# Patient Record
Sex: Female | Born: 1950 | Race: White | Hispanic: No | Marital: Married | Smoking: Never smoker
Health system: Southern US, Community
[De-identification: ages and names within clinical notes are randomized; demographics above are authoritative.]

## PROBLEM LIST (undated history)

## (undated) DIAGNOSIS — K5792 Diverticulitis of intestine, part unspecified, without perforation or abscess without bleeding: Secondary | ICD-10-CM

## (undated) DIAGNOSIS — IMO0002 Reserved for concepts with insufficient information to code with codable children: Secondary | ICD-10-CM

## (undated) DIAGNOSIS — D1720 Benign lipomatous neoplasm of skin and subcutaneous tissue of unspecified limb: Secondary | ICD-10-CM

## (undated) DIAGNOSIS — K5900 Constipation, unspecified: Secondary | ICD-10-CM

## (undated) DIAGNOSIS — Z8051 Family history of malignant neoplasm of kidney: Secondary | ICD-10-CM

## (undated) DIAGNOSIS — C801 Malignant (primary) neoplasm, unspecified: Secondary | ICD-10-CM

## (undated) DIAGNOSIS — M1990 Unspecified osteoarthritis, unspecified site: Secondary | ICD-10-CM

## (undated) DIAGNOSIS — H18519 Endothelial corneal dystrophy, unspecified eye: Secondary | ICD-10-CM

## (undated) DIAGNOSIS — Z803 Family history of malignant neoplasm of breast: Secondary | ICD-10-CM

## (undated) DIAGNOSIS — N200 Calculus of kidney: Secondary | ICD-10-CM

## (undated) DIAGNOSIS — N281 Cyst of kidney, acquired: Secondary | ICD-10-CM

## (undated) DIAGNOSIS — Z808 Family history of malignant neoplasm of other organs or systems: Secondary | ICD-10-CM

## (undated) DIAGNOSIS — M21612 Bunion of left foot: Secondary | ICD-10-CM

## (undated) DIAGNOSIS — R319 Hematuria, unspecified: Secondary | ICD-10-CM

## (undated) DIAGNOSIS — K219 Gastro-esophageal reflux disease without esophagitis: Secondary | ICD-10-CM

## (undated) DIAGNOSIS — E785 Hyperlipidemia, unspecified: Secondary | ICD-10-CM

## (undated) DIAGNOSIS — R2230 Localized swelling, mass and lump, unspecified upper limb: Secondary | ICD-10-CM

## (undated) DIAGNOSIS — Z923 Personal history of irradiation: Secondary | ICD-10-CM

## (undated) DIAGNOSIS — Z800 Family history of malignant neoplasm of digestive organs: Secondary | ICD-10-CM

## (undated) DIAGNOSIS — N950 Postmenopausal bleeding: Secondary | ICD-10-CM

## (undated) DIAGNOSIS — L404 Guttate psoriasis: Secondary | ICD-10-CM

## (undated) DIAGNOSIS — R945 Abnormal results of liver function studies: Secondary | ICD-10-CM

## (undated) DIAGNOSIS — Z947 Corneal transplant status: Secondary | ICD-10-CM

## (undated) DIAGNOSIS — E860 Dehydration: Secondary | ICD-10-CM

## (undated) DIAGNOSIS — Z8619 Personal history of other infectious and parasitic diseases: Secondary | ICD-10-CM

## (undated) DIAGNOSIS — N289 Disorder of kidney and ureter, unspecified: Secondary | ICD-10-CM

## (undated) DIAGNOSIS — D696 Thrombocytopenia, unspecified: Secondary | ICD-10-CM

## (undated) DIAGNOSIS — Z7182 Exercise counseling: Secondary | ICD-10-CM

## (undated) DIAGNOSIS — Z809 Family history of malignant neoplasm, unspecified: Secondary | ICD-10-CM

## (undated) DIAGNOSIS — M21611 Bunion of right foot: Secondary | ICD-10-CM

## (undated) DIAGNOSIS — H1851 Endothelial corneal dystrophy: Secondary | ICD-10-CM

## (undated) HISTORY — PX: APPENDECTOMY: SHX54

## (undated) HISTORY — PX: TONSILLECTOMY: SHX5217

## (undated) HISTORY — DX: Thrombocytopenia, unspecified: D69.6

## (undated) HISTORY — DX: Postmenopausal bleeding: N95.0

## (undated) HISTORY — DX: Family history of malignant neoplasm of other organs or systems: Z80.8

## (undated) HISTORY — DX: Diverticulitis of intestine, part unspecified, without perforation or abscess without bleeding: K57.92

## (undated) HISTORY — DX: Dehydration: E86.0

## (undated) HISTORY — DX: Constipation, unspecified: K59.00

## (undated) HISTORY — DX: Localized swelling, mass and lump, unspecified upper limb: R22.30

## (undated) HISTORY — DX: Family history of malignant neoplasm of digestive organs: Z80.0

## (undated) HISTORY — PX: WRIST FRACTURE SURGERY: SHX121

## (undated) HISTORY — DX: Hyperlipidemia, unspecified: E78.5

## (undated) HISTORY — DX: Bunion of right foot: M21.612

## (undated) HISTORY — DX: Personal history of other infectious and parasitic diseases: Z86.19

## (undated) HISTORY — DX: Endothelial corneal dystrophy, unspecified eye: H18.519

## (undated) HISTORY — DX: Bunion of left foot: M21.611

## (undated) HISTORY — DX: Family history of malignant neoplasm, unspecified: Z80.9

## (undated) HISTORY — DX: Corneal transplant status: Z94.7

## (undated) HISTORY — DX: Cyst of kidney, acquired: N28.1

## (undated) HISTORY — DX: Guttate psoriasis: L40.4

## (undated) HISTORY — PX: CHOLECYSTECTOMY: SHX55

## (undated) HISTORY — DX: Benign lipomatous neoplasm of skin and subcutaneous tissue of unspecified limb: D17.20

## (undated) HISTORY — DX: Gastro-esophageal reflux disease without esophagitis: K21.9

## (undated) HISTORY — DX: Reserved for concepts with insufficient information to code with codable children: IMO0002

## (undated) HISTORY — DX: Unspecified osteoarthritis, unspecified site: M19.90

## (undated) HISTORY — DX: Family history of malignant neoplasm of breast: Z80.3

## (undated) HISTORY — PX: EYE SURGERY: SHX253

## (undated) HISTORY — PX: TUBAL LIGATION: SHX77

## (undated) HISTORY — PX: TOTAL ABDOMINAL HYSTERECTOMY: SHX209

## (undated) HISTORY — DX: Exercise counseling: Z71.82

## (undated) HISTORY — DX: Disorder of kidney and ureter, unspecified: N28.9

## (undated) HISTORY — DX: Family history of malignant neoplasm of kidney: Z80.51

## (undated) HISTORY — DX: Hematuria, unspecified: R31.9

## (undated) HISTORY — DX: Abnormal results of liver function studies: R94.5

## (undated) HISTORY — PX: FRACTURE SURGERY: SHX138

## (undated) HISTORY — DX: Endothelial corneal dystrophy: H18.51

## (undated) HISTORY — DX: Malignant (primary) neoplasm, unspecified: C80.1

---

## 1984-04-16 HISTORY — PX: APPENDECTOMY: SHX54

## 1989-04-16 HISTORY — PX: TOTAL ABDOMINAL HYSTERECTOMY: SHX209

## 2003-04-17 HISTORY — PX: COLONOSCOPY: SHX174

## 2005-05-14 ENCOUNTER — Ambulatory Visit: Payer: Self-pay | Admitting: Family Medicine

## 2005-06-12 ENCOUNTER — Ambulatory Visit: Payer: Self-pay | Admitting: Family Medicine

## 2005-06-19 ENCOUNTER — Encounter: Admission: RE | Admit: 2005-06-19 | Discharge: 2005-06-19 | Payer: Self-pay | Admitting: Family Medicine

## 2005-07-16 ENCOUNTER — Ambulatory Visit: Payer: Self-pay | Admitting: Family Medicine

## 2005-09-27 ENCOUNTER — Ambulatory Visit: Payer: Self-pay | Admitting: Family Medicine

## 2005-11-08 ENCOUNTER — Encounter (INDEPENDENT_AMBULATORY_CARE_PROVIDER_SITE_OTHER): Payer: Self-pay | Admitting: Specialist

## 2005-11-08 ENCOUNTER — Ambulatory Visit (HOSPITAL_BASED_OUTPATIENT_CLINIC_OR_DEPARTMENT_OTHER): Admission: RE | Admit: 2005-11-08 | Discharge: 2005-11-08 | Payer: Self-pay | Admitting: Surgery

## 2006-01-03 ENCOUNTER — Ambulatory Visit: Payer: Self-pay | Admitting: Family Medicine

## 2006-01-22 DIAGNOSIS — M949 Disorder of cartilage, unspecified: Secondary | ICD-10-CM

## 2006-01-22 DIAGNOSIS — M899 Disorder of bone, unspecified: Secondary | ICD-10-CM | POA: Insufficient documentation

## 2006-02-15 ENCOUNTER — Ambulatory Visit: Payer: Self-pay | Admitting: Family Medicine

## 2006-02-19 ENCOUNTER — Ambulatory Visit: Payer: Self-pay | Admitting: Internal Medicine

## 2006-02-19 ENCOUNTER — Encounter: Payer: Self-pay | Admitting: Family Medicine

## 2006-04-02 ENCOUNTER — Ambulatory Visit: Payer: Self-pay | Admitting: Internal Medicine

## 2006-04-02 ENCOUNTER — Encounter: Payer: Self-pay | Admitting: Family Medicine

## 2006-07-01 ENCOUNTER — Ambulatory Visit: Payer: Self-pay | Admitting: Family Medicine

## 2006-07-04 ENCOUNTER — Encounter: Admission: RE | Admit: 2006-07-04 | Discharge: 2006-07-04 | Payer: Self-pay | Admitting: Family Medicine

## 2006-07-09 ENCOUNTER — Telehealth: Payer: Self-pay | Admitting: Family Medicine

## 2006-07-29 ENCOUNTER — Ambulatory Visit: Payer: Self-pay | Admitting: Family Medicine

## 2006-08-06 ENCOUNTER — Encounter: Payer: Self-pay | Admitting: Family Medicine

## 2007-01-23 ENCOUNTER — Ambulatory Visit: Payer: Self-pay | Admitting: Family Medicine

## 2007-02-25 ENCOUNTER — Encounter: Payer: Self-pay | Admitting: Family Medicine

## 2007-02-25 LAB — CONVERTED CEMR LAB
ALT: 27 units/L
Albumin: 4.6 g/dL
Cholesterol: 179 mg/dL
Glucose, Bld: 74 mg/dL
Total Protein: 7 g/dL
Triglycerides: 52 mg/dL

## 2007-03-10 ENCOUNTER — Telehealth (INDEPENDENT_AMBULATORY_CARE_PROVIDER_SITE_OTHER): Payer: Self-pay | Admitting: *Deleted

## 2007-03-12 ENCOUNTER — Telehealth (INDEPENDENT_AMBULATORY_CARE_PROVIDER_SITE_OTHER): Payer: Self-pay | Admitting: *Deleted

## 2007-03-18 ENCOUNTER — Ambulatory Visit: Payer: Self-pay | Admitting: Family Medicine

## 2007-03-19 ENCOUNTER — Encounter: Payer: Self-pay | Admitting: Family Medicine

## 2007-03-19 ENCOUNTER — Encounter: Admission: RE | Admit: 2007-03-19 | Discharge: 2007-03-19 | Payer: Self-pay | Admitting: Family Medicine

## 2007-03-19 LAB — CONVERTED CEMR LAB
Basophils Absolute: 0 10*3/uL (ref 0.0–0.1)
Basophils Relative: 0 % (ref 0–1)
Eosinophils Absolute: 0 10*3/uL — ABNORMAL LOW (ref 0.2–0.7)
Eosinophils Relative: 1 % (ref 0–5)
HCT: 42.7 % (ref 36.0–46.0)
MCHC: 33.7 g/dL (ref 30.0–36.0)
MCV: 90.7 fL (ref 78.0–100.0)
Neutrophils Relative %: 61 % (ref 43–77)
Platelets: 153 10*3/uL (ref 150–400)
RDW: 12.7 % (ref 11.5–15.5)
TSH: 1.103 microintl units/mL (ref 0.350–5.50)
WBC: 5.8 10*3/uL (ref 4.0–10.5)

## 2007-03-20 ENCOUNTER — Telehealth: Payer: Self-pay | Admitting: Family Medicine

## 2007-03-27 ENCOUNTER — Encounter: Payer: Self-pay | Admitting: Family Medicine

## 2007-03-27 DIAGNOSIS — R319 Hematuria, unspecified: Secondary | ICD-10-CM | POA: Insufficient documentation

## 2007-04-02 ENCOUNTER — Encounter: Payer: Self-pay | Admitting: Family Medicine

## 2007-04-03 LAB — CONVERTED CEMR LAB
Bilirubin Urine: NEGATIVE
Hemoglobin, Urine: NEGATIVE
Leukocytes, UA: NEGATIVE
Protein, ur: NEGATIVE mg/dL
Urine Glucose: NEGATIVE mg/dL
pH: 6 (ref 5.0–8.0)

## 2007-04-04 ENCOUNTER — Telehealth: Payer: Self-pay | Admitting: Family Medicine

## 2007-04-07 ENCOUNTER — Encounter: Payer: Self-pay | Admitting: Family Medicine

## 2007-05-22 ENCOUNTER — Ambulatory Visit: Payer: Self-pay | Admitting: Family Medicine

## 2007-05-22 DIAGNOSIS — R109 Unspecified abdominal pain: Secondary | ICD-10-CM | POA: Insufficient documentation

## 2007-06-17 ENCOUNTER — Encounter: Admission: RE | Admit: 2007-06-17 | Discharge: 2007-06-17 | Payer: Self-pay | Admitting: Family Medicine

## 2007-06-19 ENCOUNTER — Encounter: Admission: RE | Admit: 2007-06-19 | Discharge: 2007-06-19 | Payer: Self-pay | Admitting: Family Medicine

## 2007-07-14 ENCOUNTER — Ambulatory Visit: Payer: Self-pay | Admitting: Family Medicine

## 2007-07-15 ENCOUNTER — Telehealth: Payer: Self-pay | Admitting: Family Medicine

## 2007-07-16 ENCOUNTER — Encounter: Admission: RE | Admit: 2007-07-16 | Discharge: 2007-08-01 | Payer: Self-pay | Admitting: Family Medicine

## 2007-08-01 ENCOUNTER — Encounter: Payer: Self-pay | Admitting: Family Medicine

## 2007-11-03 ENCOUNTER — Ambulatory Visit: Payer: Self-pay | Admitting: Family Medicine

## 2007-12-16 ENCOUNTER — Ambulatory Visit: Payer: Self-pay | Admitting: Family Medicine

## 2007-12-16 LAB — CONVERTED CEMR LAB
Glucose, Urine, Semiquant: NEGATIVE
Ketones, urine, test strip: NEGATIVE
Protein, U semiquant: NEGATIVE
Specific Gravity, Urine: 1.01
WBC Urine, dipstick: NEGATIVE
pH: 6.5

## 2007-12-17 LAB — CONVERTED CEMR LAB
ALT: 26 units/L (ref 0–35)
AST: 27 units/L (ref 0–37)
Albumin: 4.8 g/dL (ref 3.5–5.2)
CO2: 28 meq/L (ref 19–32)
Calcium: 9.9 mg/dL (ref 8.4–10.5)
Chloride: 102 meq/L (ref 96–112)
Potassium: 4.8 meq/L (ref 3.5–5.3)
Total Protein: 7.7 g/dL (ref 6.0–8.3)

## 2008-03-05 ENCOUNTER — Ambulatory Visit: Payer: Self-pay | Admitting: Family Medicine

## 2008-03-05 LAB — CONVERTED CEMR LAB
Basophils Absolute: 0 10*3/uL (ref 0.0–0.1)
Basophils Relative: 0 % (ref 0–1)
Hemoglobin: 14.4 g/dL (ref 12.0–15.0)
MCHC: 33.9 g/dL (ref 30.0–36.0)
Monocytes Absolute: 0.3 10*3/uL (ref 0.1–1.0)
Neutro Abs: 3.1 10*3/uL (ref 1.7–7.7)
Neutrophils Relative %: 61 % (ref 43–77)
Nitrite: NEGATIVE
Platelets: 148 10*3/uL — ABNORMAL LOW (ref 150–400)
Protein, U semiquant: NEGATIVE
RBC / HPF: NONE SEEN (ref <3)
RDW: 13.2 % (ref 11.5–15.5)
Urobilinogen, UA: 0.2
WBC Urine, dipstick: NEGATIVE
pH: 5.5

## 2008-03-08 ENCOUNTER — Ambulatory Visit: Payer: Self-pay | Admitting: Internal Medicine

## 2008-03-10 ENCOUNTER — Telehealth (INDEPENDENT_AMBULATORY_CARE_PROVIDER_SITE_OTHER): Payer: Self-pay | Admitting: *Deleted

## 2008-03-17 ENCOUNTER — Telehealth: Payer: Self-pay | Admitting: Internal Medicine

## 2008-03-26 ENCOUNTER — Telehealth: Payer: Self-pay | Admitting: Internal Medicine

## 2008-03-26 ENCOUNTER — Encounter: Payer: Self-pay | Admitting: Family Medicine

## 2008-03-30 ENCOUNTER — Ambulatory Visit: Payer: Self-pay | Admitting: Internal Medicine

## 2008-03-31 LAB — CONVERTED CEMR LAB
Fecal Occult Blood: NEGATIVE
OCCULT 3: NEGATIVE
OCCULT 4: NEGATIVE

## 2008-04-20 ENCOUNTER — Ambulatory Visit: Payer: Self-pay | Admitting: Internal Medicine

## 2008-05-07 ENCOUNTER — Encounter: Payer: Self-pay | Admitting: Family Medicine

## 2008-05-31 ENCOUNTER — Ambulatory Visit: Payer: Self-pay | Admitting: Family Medicine

## 2008-05-31 DIAGNOSIS — F411 Generalized anxiety disorder: Secondary | ICD-10-CM | POA: Insufficient documentation

## 2008-05-31 DIAGNOSIS — M6740 Ganglion, unspecified site: Secondary | ICD-10-CM | POA: Insufficient documentation

## 2008-07-14 ENCOUNTER — Encounter: Admission: RE | Admit: 2008-07-14 | Discharge: 2008-07-14 | Payer: Self-pay | Admitting: Family Medicine

## 2009-04-06 ENCOUNTER — Ambulatory Visit: Payer: Self-pay | Admitting: Family Medicine

## 2009-04-06 ENCOUNTER — Encounter: Admission: RE | Admit: 2009-04-06 | Discharge: 2009-04-06 | Payer: Self-pay | Admitting: Family Medicine

## 2009-04-06 DIAGNOSIS — M5412 Radiculopathy, cervical region: Secondary | ICD-10-CM | POA: Insufficient documentation

## 2009-04-07 LAB — CONVERTED CEMR LAB
ALT: 31 units/L (ref 0–35)
AST: 30 units/L (ref 0–37)
Albumin: 4.4 g/dL (ref 3.5–5.2)
Alkaline Phosphatase: 58 units/L (ref 39–117)
HCT: 40.3 % (ref 36.0–46.0)
LDL Cholesterol: 97 mg/dL (ref 0–99)
Lymphocytes Relative: 27 % (ref 12–46)
Lymphs Abs: 1.7 10*3/uL (ref 0.7–4.0)
Neutrophils Relative %: 64 % (ref 43–77)
Platelets: 205 10*3/uL (ref 150–400)
Potassium: 4.3 meq/L (ref 3.5–5.3)
RBC: 4.63 M/uL (ref 3.87–5.11)
Sodium: 143 meq/L (ref 135–145)
Total Protein: 7.1 g/dL (ref 6.0–8.3)
Vit D, 25-Hydroxy: 9 ng/mL — ABNORMAL LOW (ref 30–89)
WBC: 6.3 10*3/uL (ref 4.0–10.5)

## 2009-04-12 ENCOUNTER — Telehealth: Payer: Self-pay | Admitting: Family Medicine

## 2009-04-14 ENCOUNTER — Encounter: Payer: Self-pay | Admitting: Family Medicine

## 2009-05-06 ENCOUNTER — Ambulatory Visit: Payer: Self-pay | Admitting: Family Medicine

## 2009-05-06 ENCOUNTER — Telehealth (INDEPENDENT_AMBULATORY_CARE_PROVIDER_SITE_OTHER): Payer: Self-pay | Admitting: *Deleted

## 2009-05-06 DIAGNOSIS — L408 Other psoriasis: Secondary | ICD-10-CM | POA: Insufficient documentation

## 2009-05-06 DIAGNOSIS — E559 Vitamin D deficiency, unspecified: Secondary | ICD-10-CM | POA: Insufficient documentation

## 2009-07-04 ENCOUNTER — Telehealth (INDEPENDENT_AMBULATORY_CARE_PROVIDER_SITE_OTHER): Payer: Self-pay | Admitting: *Deleted

## 2009-07-05 ENCOUNTER — Encounter: Payer: Self-pay | Admitting: Family Medicine

## 2009-07-06 LAB — CONVERTED CEMR LAB: Vit D, 25-Hydroxy: 57 ng/mL (ref 30–89)

## 2009-07-14 ENCOUNTER — Encounter: Admission: RE | Admit: 2009-07-14 | Discharge: 2009-07-14 | Payer: Self-pay | Admitting: Family Medicine

## 2009-07-25 ENCOUNTER — Ambulatory Visit: Payer: Self-pay | Admitting: Family Medicine

## 2009-07-25 LAB — CONVERTED CEMR LAB
Protein, U semiquant: >=300
Specific Gravity, Urine: 1.025
Urobilinogen, UA: 1
WBC Urine, dipstick: NEGATIVE

## 2009-07-26 ENCOUNTER — Telehealth (INDEPENDENT_AMBULATORY_CARE_PROVIDER_SITE_OTHER): Payer: Self-pay | Admitting: *Deleted

## 2009-07-26 ENCOUNTER — Encounter: Payer: Self-pay | Admitting: Family Medicine

## 2009-07-26 LAB — CONVERTED CEMR LAB

## 2009-07-28 ENCOUNTER — Encounter: Payer: Self-pay | Admitting: Family Medicine

## 2009-08-01 ENCOUNTER — Encounter: Payer: Self-pay | Admitting: Family Medicine

## 2010-02-01 ENCOUNTER — Ambulatory Visit (HOSPITAL_COMMUNITY): Admission: RE | Admit: 2010-02-01 | Discharge: 2010-02-01 | Payer: Self-pay | Admitting: Urology

## 2010-02-08 ENCOUNTER — Encounter: Payer: Self-pay | Admitting: Family Medicine

## 2010-05-07 ENCOUNTER — Encounter: Payer: Self-pay | Admitting: Family Medicine

## 2010-05-16 NOTE — Progress Notes (Signed)
Summary: FYI   Phone Note Call from Patient   Caller: Patient Summary of Call: Pt called to inform you that [facility] only [facility] day [facility] at a time. So they dispensed #4 w/ 3 refills. Pt's insurance will only cover every 30 days.  Initial call taken by: Payton Spark CMA,  May 06, 2009 3:54 PM

## 2010-05-16 NOTE — Assessment & Plan Note (Signed)
Summary: blood in urine  Nurse Visit    Allergies: No Known Drug Allergies Laboratory Results   Urine Tests    Routine Urinalysis   Color: brown Appearance: Clear Glucose: 100   (Normal Range: Negative) Bilirubin: large   (Normal Range: Negative) Ketone: small (15)   (Normal Range: Negative) Spec. Gravity: 1.025   (Normal Range: 1.003-1.035) Blood: large   (Normal Range: Negative) pH: 5.5   (Normal Range: 5.0-8.0) Protein: >=300   (Normal Range: Negative) Urobilinogen: 1.0   (Normal Range: 0-1) Nitrite: negative   (Normal Range: Negative) Leukocyte Esterace: negative   (Normal Range: Negative)       Orders Added: 1)  Est. Patient Level I [30160] 2)  T-Urine Microscopic [10932-35573] 3)  UA Dipstick w/o Micro (automated)  [81003]    Impression & Recommendations:  Problem # 1:  HEMATURIA UNSPECIFIED (ICD-599.70) Send for micro to see if whole RBCs.  Will send results to urology.  She will need f/u with them.   Orders: T-Urine Microscopic (22025-42706) UA Dipstick w/o Micro (automated)  (81003)  Complete Medication List: 1)  Vitamin D 50,000 Iu  .Marland Kitchen.. 1 capsule by mouth once a wk   Pt aware.Arvilla Market CMAMarcelino Duster July 25, 2009 2:58 PM

## 2010-05-16 NOTE — Letter (Signed)
Summary: [facility]  [facility]   Imported By: Lanelle Bal 08/10/2009 08:22:52  _____________________________________________________________________  External Attachment:    Type:   Image     Comment:   External Document

## 2010-05-16 NOTE — Assessment & Plan Note (Signed)
Summary: f/u rash/ low Vit D   Vital Signs:  Patient profile:   59 year old female Height:      60.5 inches Weight:      144 pounds BMI:     27.76 O2 Sat:      99 % on Room air Temp:     98.2 degrees F oral Pulse rate:   75 / minute BP sitting:   123 / 77  (left arm) Cuff size:   regular  Vitals Entered By: Payton Spark CMA (May 06, 2009 10:20 AM)  O2 Flow:  Room air CC: F/U    Primary Care Provider:  Seymour Bars DO  CC:  F/U .  History of Present Illness: 60 yo WF presents for f/u rash.  She was seen by Dr Stefanie Libel and the rash was identified at guttate posorisias.  She is on clobetasol cream and the rash is getting much better.  She was treated for a URI with Keflex presuming that it might have been strep.    Her cervical radiculopathy has completely improved.  Her Prednisone burst really helped.  She had arthritis in her neck on xray.  She decided not to do PT since her pain has improved.       Current Medications (verified): 1)  Vitamin D 50,000 Iu .Marland Kitchen.. 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 Retired Pensions consultant.  Moved from [place] in 2006.  Daughter and 2 young grandkids live here.  Married to Belle.  Likes to run.  Has dogs. Patient has never smoked.  Alcohol Use - no Daily Caffeine Use -2 Illicit Drug Use - no  Review of Systems      See HPI  Physical Exam  General:  alert, well-developed, well-nourished, well-hydrated, and overweight-appearing.   Neck:  no masses.   Lungs:  Normal respiratory effort, chest expands symmetrically. Lungs are clear to auscultation, no crackles or wheezes. Heart:  Normal rate and regular rhythm. S1 and S2 normal without gallop,  murmur, click, rub or other extra sounds. Msk:  full active C spine and glenohumeral ROM Neurologic:  grip + 5/5 Skin:  color normal.  guttate psoriasis rash over LEs only Psych:  good eye contact, not anxious appearing, and not depressed appearing.     Impression & Recommendations:  Problem # 1:  CERVICAL RADICULOPATHY, RIGHT (ICD-723.4) Assessment Improved C spine Xray confirms arthritic changes.  Her symptom resolved after a prednisone burst.  Her ROM is excellent today so she declined any need for PT.  She can use OTC Aleve on as needed basis.  Problem # 2:  UNSPECIFIED VITAMIN D DEFICIENCY (ICD-268.9) She is to stay on RX Vit D for 2 more months then REcheck level.  She was 9 last month.  She is also taking OTC Vit D 1000 International Units daily.    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  She agrees to work on relaxation and to start exercising to deal with her symptoms.  Call if worsening.  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 Seen by Dr  Stefanie Libel. Triggered by URI.  Complete Medication List: 1)  Vitamin D 50,000 Iu  .Marland Kitchen.. 1 capsule by mouth once a wk  Patient Instructions: 1)  Work on getting exercise 45+ minutes 4 days/ wk. Prescriptions: VITAMIN D 50,000 IU 1 capsule by mouth once a wk  #11 tabs x 0   Entered and Authorized by:   Seymour Bars DO   Signed by:   Seymour Bars DO on 05/06/2009   Method used:   Printed then [facility]ed to ...       [facility]  [address] [phone]       [address]       [place], [place]  [address]       Ph: [phone] or [phone]       Fax: [phone]   RxID:   [id number]

## 2010-05-16 NOTE — Consult Note (Signed)
Summary: [facility]   Imported By: Lanelle Bal 05/05/2009 11:58:01  _____________________________________________________________________  External Attachment:    Type:   Image     Comment:   External Document

## 2010-05-16 NOTE — Progress Notes (Signed)
Summary: Lab orders   

## 2010-05-16 NOTE — Progress Notes (Signed)
Summary: pt. returned Michelle's call  Phone Note Call from Patient   Summary of Call: Call patient  back on her cell#  [phone], She was returning Michelle's phone call. Thank you, Michaelle Copas Initial call taken by: Michaelle Copas,  July 26, 2009 12:18 PM  Follow-up for Phone Call        Pt aware of apt Follow-up by: Payton Spark CMA,  July 26, 2009 1:06 PM

## 2010-05-16 NOTE — Letter (Signed)
Summary: [facility]  [facility]   Imported By: Lanelle Bal 02/27/2010 08:18:57  _____________________________________________________________________  External Attachment:    Type:   Image     Comment:   External Document

## 2010-05-16 NOTE — Consult Note (Signed)
Summary: [facility]  [facility]   Imported By: Lanelle Bal 08/10/2009 08:20:28  _____________________________________________________________________  External Attachment:    Type:   Image     Comment:   External Document

## 2010-06-07 ENCOUNTER — Other Ambulatory Visit: Payer: Self-pay | Admitting: Family Medicine

## 2010-06-07 DIAGNOSIS — Z1231 Encounter for screening mammogram for malignant neoplasm of breast: Secondary | ICD-10-CM

## 2010-06-28 LAB — CREATININE, SERUM
Creatinine, Ser: 0.61 mg/dL (ref 0.4–1.2)
GFR calc non Af Amer: >60 mL/min (ref >60)

## 2010-07-17 ENCOUNTER — Ambulatory Visit
Admission: RE | Admit: 2010-07-17 | Discharge: 2010-07-17 | Disposition: A | Discharge status: Home / Self Care | Payer: BC Managed Care – PPO | Source: Ambulatory Visit | Attending: Family Medicine | Admitting: Family Medicine

## 2010-07-17 DIAGNOSIS — Z1231 Encounter for screening mammogram for malignant neoplasm of breast: Secondary | ICD-10-CM

## 2010-09-01 NOTE — Assessment & Plan Note (Signed)
[facility]                           GASTROENTEROLOGY OFFICE NOTE   Joan Lopez, Joan Lopez                      MRN:          [id number]  DATE:02/19/2006                            DOB:          1950-07-24    CHIEF COMPLAINT:  Rectal bleeding.   ASSESSMENT:  Intermittent rectal bleeding related to running.  I think this  is anorectal bleeding, traumatic, probably from running, question if she has  some anal or lower rectal irritation from that.  She had a colonoscopy in  2005 that was normal by report and we will request that.   PLAN:  I have asked her to try to lubricate with Vaseline around the  perianal area and perineal body to see if that helps.  She will come back to  see me in 2 months to reassess to make sure this resolves.  If not, a  sigmoidoscopy could be needed.  An anoscopy was entirely unrevealing today.  We will review the colonoscopy reports as well.   HISTORY:  A 60 year old white woman that has noted that when she runs and  develops an urge to defecate, if she holds onto the bowel movement and then  when she returns she will sometimes have blood on the tissue paper after  defecation.  Sometimes there will be a streak of bright red blood on the  stool, which is brown, as well.  There is no real change in bowel habits  with diarrhea or stool size.  This has only happened a few to several times  in her entire life.  Things seem to get a little worse after she took  Augmentin because of a dog bite and had some perineal and vaginal itching  which did improve with Diflucan.   MEDICATIONS:  None.   DRUG ALLERGIES:  She lists AUGMENTIN because of those problems.   PAST MEDICAL HISTORY:  1. Hysterectomy.  2. Tubal ligation.  3. Appendectomy.  4. Cholecystectomy.  5. Colonoscopy as above.  6. Dog bite as above.   FAMILY HISTORY:  Breast cancer in a sister.  A maternal aunt had colon  cancer.  A daughter has Crohn's disease.   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  No alcohol, tobacco or drugs.   REVIEW OF SYSTEMS:  Otherwise negative.   PHYSICAL EXAMINATION:  GENERAL:  Reveals a pleasant, well-developed, middle-  aged white woman in no acute distress.  VITAL SIGNS:  Height 5 feet, weight 104 pounds, blood pressure 130/84.  EYES:  Anicteric.  ABDOMEN:  Soft, nontender, without organomegaly or mass.  RECTAL:  Exam in the presence of female nursing staff shows scanty brown  stool, no mass, nontender, no hemorrhoids.  There is no perianal change.  An  anoscopy is performed which demonstrates a normal exam.  No groin  adenopathy.  NEUROLOGIC/PSYCHIATRIC:  She is alert and oriented x3.   I appreciate the opportunity to care for this patient.     Iva Boop, MD,FACG  Electronically Signed    CEG/MedQ  DD: 02/19/2006  DT: 02/20/2006  Job #: [id number]   cc:   Nani Gasser, M.D.

## 2010-09-01 NOTE — Op Note (Signed)
NAME:  Joan Lopez, Joan Lopez NO.:  [id number]   MEDICAL RECORD NO.:  [id number]          PATIENT TYPE:  AMB   LOCATION:  [facility]                         FACILITY:  [facility]   PHYSICIAN:  Thomas A. Cornett, M.D.DATE OF BIRTH:  10-01-1950   DATE OF PROCEDURE:  11/08/2005  DATE OF DISCHARGE:                                 OPERATIVE REPORT   PREOPERATIVE DIAGNOSIS:  Right arm mass.   POSTOPERATIVE DIAGNOSIS:  Right arm mass.   PROCEDURE:  Excision of right upper extremity mass measuring 2 x 3 cm,  probable lipoma.   SURGEON:  Maisie Fus A. Cornett, M.D.   ANESTHESIA:  MAC with 10 mL of 0.5% Sensorcaine with epinephrine local.   ESTIMATED BLOOD LOSS:  5 mL.   SPECIMEN:  Right arm mass to pathology.   INDICATIONS FOR PROCEDURE:  The patient is a 60 year old female with an  enlarging mass in her right upper extremity.  This looked like a lipoma on  exam but it was getting larger and causing her discomfort.  She decided to  have it removed.  After discussion of the procedure and potential  complications, she agreed to proceed.   DESCRIPTION OF PROCEDURE:  The patient was brought to the operating room nd  placed supine.  Her right upper extremity was prepped and draped in a  sterile fashion.  After induction of MAC anesthesia, the area was  anesthetized with local anesthesia.  Incision was made over the mass  measuring 2-3 cm.  We were able to dissect down and this came out quite  easily in the subcutaneous fat.  It was sent off to pathology.  Hemostasis  was excellent.  The wound was closed in two layers using a 3-0 Vicryl and a  4-0 Monocryl.  Sterile dressings were applied.  The patient tolerated the  procedure well.  She is taken to the recovery room in satisfactory  condition.  All final counts of sponge, needle and instruments are found to  be correct.      Thomas A. Cornett, M.D.  Electronically Signed     TAC/MEDQ  D:  11/08/2005  T:  11/08/2005  Job:   [id number]   cc:   Lebron Conners, M.D.  [address]  [place]  [place] [address]

## 2010-09-01 NOTE — Assessment & Plan Note (Signed)
[facility]                         GASTROENTEROLOGY OFFICE NOTE   Joan Lopez, Joan Lopez                      MRN:          [id number]  DATE:04/02/2006                            DOB:          Nov 23, 1950    CHIEF COMPLAINT:  Followup of rectal bleeding.   Ms. Scism had two spells of rectal bleeding close to her last visit,  but none since then.  She used some Vaseline on the perineal and  perianal area, but now has not needed to do that, and she is running  without difficulty and no rectal bleeding.  I have reviewed her  colonoscopy report from 2005, and that showed some hemorrhoids but was  otherwise unrevealing.   PHYSICAL EXAMINATION:  Weight 104 pounds, pulse 68, blood pressure  118/80.   Rectal bleeding, resolved.  It sounds anorectal.  She is reassured  today.   PLAN:  If this recurs with any significant regularity she is to come  back, or if she is actually passing blood into the toilet.  She  understands, and will look for that.  A routine screening colonoscopy in  2015 is appropriate.  She is advised about this.  In 2010 it would not  be unreasonable to start yearly Hemoccults.     Iva Boop, MD,FACG  Electronically Signed    CEG/MedQ  DD: 04/02/2006  DT: 04/03/2006  Job #: [id number]   cc:   Nani Gasser, M.D.

## 2010-11-28 ENCOUNTER — Encounter: Payer: Self-pay | Admitting: Family Medicine

## 2010-11-28 ENCOUNTER — Ambulatory Visit (INDEPENDENT_AMBULATORY_CARE_PROVIDER_SITE_OTHER): Payer: BC Managed Care – PPO | Admitting: Family Medicine

## 2010-11-28 VITALS — BP 121/76 | HR 101 | Ht 60.5 in | Wt 136.0 lb

## 2010-11-28 DIAGNOSIS — H18519 Endothelial corneal dystrophy, unspecified eye: Secondary | ICD-10-CM

## 2010-11-28 DIAGNOSIS — R209 Unspecified disturbances of skin sensation: Secondary | ICD-10-CM

## 2010-11-28 DIAGNOSIS — R200 Anesthesia of skin: Secondary | ICD-10-CM

## 2010-11-28 NOTE — Progress Notes (Signed)
  Subjective:    Patient ID: Joan Lopez, female    DOB: Feb 01, 1951, 60 y.o.   MRN: [id number]  HPI Started to noticed some numbness on the right side of her face in mid July . She recently had dental work but that was tech before her procedure.  The Left eye has been more moist as well.  No facial drooping.  HShe did talk to her dentisnt about this. Sx are usually daily, but comes and goes.  Not painful.  No recent vision changes. No ear pain. No recent colds or URI. No difficulty swallowing. No prior history. She also noted she has a history of cervical arthritis  Guttate psoriasis dx a couple of years ago.   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    Review of Systems      Objective:   Physical Exam  Constitutional: She is oriented to person, place, and time. She appears well-developed and well-nourished.  HENT:  Head: Normocephalic and atraumatic.  Right Ear: External ear normal.  Left Ear: External ear normal.  Nose: Nose normal.  Mouth/Throat: Oropharynx is clear and moist.       TMs and canals are clear bilaterally  Neck: Neck supple. No thyromegaly present.  Musculoskeletal:       Neck with normal range of motion  Lymphadenopathy:    She has no cervical adenopathy.  Neurological: She is alert and oriented to person, place, and time. No cranial nerve deficit.       No decrease in sensation, it is symmetric over both sides of the face.  Skin: Skin is warm and dry.  Psychiatric: She has a normal mood and affect. Her behavior is normal.          Assessment & Plan:  Facial numbness left side-unclear etiology. This does not appear to be a Bell's palsy or a trigeminal neuralgia per se don't see any sign of infection on exam. They'll check a CBC. She is too young to have temporal arteritis and usually this is associated with headaches and pain which she does not have to. They'll I will check a sedimentation rate today. She does have a history of cervical  radiculopathy but this should be unrelated to the facial numbness. Her exam is technically normal. Consider other cerebral lesion and that this is unlikely. Consider imaging of her head her lab tests are normal. Her symptoms are certainly having more frequently on a daily basis. She has no prior history of migraines I don't think this is a migraine variant. This also started technically before she had her dental work, thus unlikely to be related to that.

## 2010-11-29 ENCOUNTER — Telehealth: Payer: Self-pay | Admitting: Family Medicine

## 2010-11-29 DIAGNOSIS — R200 Anesthesia of skin: Secondary | ICD-10-CM

## 2010-11-29 LAB — CBC WITH DIFFERENTIAL/PLATELET
Basophils Absolute: 0 10*3/uL (ref 0.0–0.1)
Basophils Relative: 0 % (ref 0–1)
Hemoglobin: 13.2 g/dL (ref 12.0–15.0)
MCHC: 33.2 g/dL (ref 30.0–36.0)
Monocytes Relative: 8 % (ref 3–12)
Neutro Abs: 2.6 10*3/uL (ref 1.7–7.7)
Neutrophils Relative %: 59 % (ref 43–77)

## 2010-11-29 LAB — SEDIMENTATION RATE: Sed Rate: 7 mm/hr (ref 0–22)

## 2010-11-29 LAB — TSH: TSH: 1.426 u[IU]/mL (ref 0.350–4.500)

## 2010-11-29 NOTE — Telephone Encounter (Signed)
Addended by: Nani Gasser D on: 11/29/2010 02:18 PM   Modules accepted: Orders

## 2010-11-29 NOTE — Telephone Encounter (Signed)
Call pt: CBC, thyroid, and sed rate is nl. I really would like her to see a neurologist to help figure out what is causing this. IF ok then let me know.

## 2010-11-29 NOTE — Telephone Encounter (Signed)
Pt notified and would like to get a referral to neurology.

## 2010-11-29 NOTE — Telephone Encounter (Signed)
LMOM with results and to call back if wants a neuro appt

## 2010-11-29 NOTE — Telephone Encounter (Signed)
Referral entered  

## 2011-02-06 DIAGNOSIS — G500 Trigeminal neuralgia: Secondary | ICD-10-CM | POA: Insufficient documentation

## 2011-03-28 ENCOUNTER — Telehealth: Payer: Self-pay | Admitting: Internal Medicine

## 2011-03-28 ENCOUNTER — Telehealth: Payer: Self-pay | Admitting: *Deleted

## 2011-03-28 NOTE — Telephone Encounter (Signed)
Pt having some discomfort on left side since before thanksgiving. Hasn't gotten any better and getting a little more worse. Dx with Diverticulitis in the past and has occasional flares. Dull ache, no nausea. Pt wants to know what she should do- meds or be seen.

## 2011-03-28 NOTE — Telephone Encounter (Signed)
The best would be for her to call her GI but if can't get in we can try to see her.

## 2011-03-28 NOTE — Telephone Encounter (Signed)
Pt notified. KJ LPN 

## 2011-03-28 NOTE — Telephone Encounter (Signed)
Patient has a discomfort in her abdomen that has been "coming and going" since November.  She says "its not really a pain as much as a feeling".  She states it is not associated with any other GI symptoms.  She will see Dr Linford Arnold on Monday.  I have scheduled her an appt for 04/19/11,  If Dr Linford Arnold feels it is needed we will work her in with an extender for an earlier appt.  Patient is agreeable to this plan.  She will call back for worsening symptoms.

## 2011-03-29 ENCOUNTER — Encounter: Payer: Self-pay | Admitting: Family Medicine

## 2011-04-02 ENCOUNTER — Encounter: Payer: Self-pay | Admitting: Family Medicine

## 2011-04-02 ENCOUNTER — Ambulatory Visit (INDEPENDENT_AMBULATORY_CARE_PROVIDER_SITE_OTHER): Payer: BC Managed Care – PPO | Admitting: Family Medicine

## 2011-04-02 DIAGNOSIS — R109 Unspecified abdominal pain: Secondary | ICD-10-CM

## 2011-04-02 LAB — COMPLETE METABOLIC PANEL WITH GFR
ALT: 25 U/L (ref 0–35)
AST: 28 U/L (ref 0–37)
Alkaline Phosphatase: 63 U/L (ref 39–117)
Creat: 0.66 mg/dL (ref 0.50–1.10)
Total Bilirubin: 0.6 mg/dL (ref 0.3–1.2)

## 2011-04-02 LAB — CBC WITH DIFFERENTIAL/PLATELET
Eosinophils Absolute: 0 10*3/uL (ref 0.0–0.7)
Eosinophils Relative: 0 % (ref 0–5)
Hemoglobin: 13.3 g/dL (ref 12.0–15.0)
Lymphocytes Relative: 26 % (ref 12–46)
Lymphs Abs: 1.3 10*3/uL (ref 0.7–4.0)
MCH: 30.2 pg (ref 26.0–34.0)
MCV: 87 fL (ref 78.0–100.0)
Monocytes Relative: 8 % (ref 3–12)
RBC: 4.4 MIL/uL (ref 3.87–5.11)

## 2011-04-02 NOTE — Patient Instructions (Signed)
We will call you with your lab results. If you don't here from us in about a week then please give us a call at [phone].  

## 2011-04-02 NOTE — Progress Notes (Signed)
Subjective:    Patient ID: Joan Lopez, female    DOB: 1951/04/06, 60 y.o.   MRN: [id number]  HPI In 1995 saw Dr. Angelina Sheriff and told had "diverticulitis".  Never had fever  or bowel changes. Will get one episode a year of cramping feeling for 2-3 days in duration.  Still no fever this time or blood in the stool.  No N/V.  No change in bowels. No constipation or diarrhea.  Got away with no medication intervention. Presents today with an epidose that has last 4 weeks. Same sxs.  Her GI is Dr. Elenore Paddy but can't get in until Jan 3rd. Says last colonsocopy was 8 yr ago.  She says the condition has not felt painful but feels more like dull ache   Review of Systems BP 119/73  Pulse 86  Wt 146 lb (66.225 kg)    No Known Allergies  Past Medical History  Diagnosis Date  . Bilateral bunions   . DDD (degenerative disc disease)     spine w narrowing  . Diverticulitis   . Guttate psoriasis     Past Surgical History  Procedure Date  . Appendectomy   . Tubal ligation   . Cholecystectomy   . Ltcs   . Lipoma removed     right axillary  . Total abdominal hysterectomy     for fibroids    History   Social History  . Marital Status: Married    Spouse Name: N/A    Number of Children: N/A  . Years of Education: N/A   Occupational History  . Not on file.   Social History Main Topics  . Smoking status: Never Smoker   . Smokeless tobacco: Not on file  . Alcohol Use: No  . Drug Use: No  . Sexually Active: Not on file   Other Topics Concern  . Not on file   Social History Narrative  . No narrative on file    Family History  Problem Relation Age of Onset  . Stroke Mother   . Heart disease Mother   . Kidney disease Father   . Cancer Sister 63    breast  . Cancer Maternal Aunt     colon, stomach  . Cancer Paternal Uncle     neck tumor  . Cancer Maternal Grandmother     brain tumor  . Cancer Paternal Grandmother     post menopausal breast cancer         Objective:   Physical Exam  Constitutional: She appears well-developed and well-nourished.  HENT:  Head: Atraumatic.  Cardiovascular: Normal rate, regular rhythm and normal heart sounds.   Pulmonary/Chest: Effort normal and breath sounds normal.  Abdominal: Soft. Bowel sounds are normal. She exhibits no distension and no mass. There is no tenderness. There is no rebound and no guarding.  Skin: Skin is warm and dry.  Psychiatric: She has a normal mood and affect. Her behavior is normal.          Assessment & Plan:  Abdominal pain-unclear etiology at this point. I'm not 100% convinced that her episodes are truly diverticulitis as she does not run a fever he does not have any significant bowel changes. This is a consideration. At this point in time because her symptoms have persisted for 4 weeks then I would like to at least get a CBC to evaluate for possible infection. If this is normal consider a probiotic to see if this improves her abdominal discomfort over the next  week or 2. She denies any history of allergies. If her white count is elevated then I would like to get a CT of the abdomen to evaluate for diverticulitis. We'll also check a CMP and amylase to evaluate her liver and pancreas function.

## 2011-04-17 HISTORY — PX: CORNEAL TRANSPLANT: SHX108

## 2011-04-18 ENCOUNTER — Ambulatory Visit (INDEPENDENT_AMBULATORY_CARE_PROVIDER_SITE_OTHER): Payer: BC Managed Care – PPO | Admitting: Family Medicine

## 2011-04-18 DIAGNOSIS — Z0000 Encounter for general adult medical examination without abnormal findings: Secondary | ICD-10-CM

## 2011-04-18 NOTE — Progress Notes (Signed)
Patient ID: Joan Lopez, female   DOB: 1950-09-03, 61 y.o.   MRN: [id number] Patient in only for meet and greet consultation, no exam performed or history taken.

## 2011-04-19 ENCOUNTER — Ambulatory Visit (INDEPENDENT_AMBULATORY_CARE_PROVIDER_SITE_OTHER): Payer: BC Managed Care – PPO | Admitting: Internal Medicine

## 2011-04-19 ENCOUNTER — Encounter: Payer: Self-pay | Admitting: Internal Medicine

## 2011-04-19 VITALS — BP 138/94 | HR 84 | Ht 61.0 in | Wt 148.6 lb

## 2011-04-19 DIAGNOSIS — R1032 Left lower quadrant pain: Secondary | ICD-10-CM

## 2011-04-19 NOTE — Patient Instructions (Signed)
Your physician has suggested that you stop lifting the dogs. Use Advil or Aleve if needed.

## 2011-04-19 NOTE — Progress Notes (Signed)
Subjective:    Patient ID: Joan Lopez, female    DOB: 15-Mar-1951, 61 y.o.   MRN: [id number]  HPI This lady presents with complaints of abdominal discomfort. She has some left lower quadrant pain, not really severe morbid discomfort or pressure. She is concerned it might represent something serious. There is no associated bowel habit change, no clear trigger. No rectal bleeding. She's not any fever or urinary difficulties. She does note that her pain began after she obtained at 3 laboratory retrievers. She has been lifting them up and as there gaining weight there now from 10-20 pounds in the past month. She questions it may be that is related to her problem. The pain is not positional, she does not know to what she is lifting her dogs but it did not begin until after that. She does have a history of having some recurring vague abdominal pains. She has some complex renal cysts in both kidneys there followed by her urologist, Dr. Laverle Patter. No Known Allergies No outpatient prescriptions prior to visit.   Past Medical History  Diagnosis Date  . Bilateral bunions   . DDD (degenerative disc disease)     spine w narrowing  . Diverticulitis   . Guttate psoriasis   . Hemorrhoids   . Arthritis   . Fuch's endothelial dystrophy    renal cystic lesions Past Surgical History  Procedure Date  . Appendectomy   . Tubal ligation   . Cholecystectomy   . Cesarean section   . Lipoma excision     x 2  . Total abdominal hysterectomy     for fibroids  . Wrist fracture surgery     right  . Colonoscopy 2005    [place] - diverticulosis and hemorrhoids   History   Social History  . Marital Status: Married    Spouse Name: N/A    Number of Children: N/A  . Years of Education: N/A   Social History Main Topics  . Smoking status: Never Smoker   . Smokeless tobacco: Never Used  . Alcohol Use: No  . Drug Use: No  . Sexually Active: None   Other Topics Concern  . None   Social History  Narrative  . None   Family History  Problem Relation Age of Onset  . Stroke Mother   . Heart disease Mother   . Kidney disease Father   . Breast cancer Sister 45  . Colon cancer Maternal Aunt   . Cancer Paternal Uncle     neck tumor  . Cancer Maternal Grandmother     brain tumor  . Breast cancer Paternal Grandmother     post menopausal   . Stomach cancer Maternal Aunt   . Kidney cancer Cousin         Review of Systems Joint pains, visual disturbance from Fuchs endothelial dystrophy, she needs corneal transplants all other systems negative.    Objective:   Physical Exam General:  NAD Eyes:   anicteric Lungs:  clear Heart:  S1S2 no rubs, murmurs or gallops Abdomen:  soft and nontender, BS+ there is no pain with muscle tension. There is no pain with manipulation of the hip joints   and lower rim these. Ext:   no edema    Data Reviewed:  I reviewed a previous MRI of the abdomen which shows her complex renal cysts. This is from 2011.     1. Stable hemorrhagic cystic lesion within the right renal pelvis  without evidence of enhancement.  2. Small Bosniak type 1 renal cyst within the left renal cortex.  3. Dilatation of the common bile duct likely related to prior  cholecystectomy.  4. Simple hepatic cyst.      Assessment & Plan:   1. LLQ pain     I think her pain is probably from lifting the dogs. The overall clinical context is that of a benign issue. I advised her to stop lifting the dogs, they will only get heavier. She has these renal cysts and has followup with Dr. Laverle Patter due for March, I suggested she could consider going early. There is a plan for ultrasound follow up of the cysts though its extremely unlikely there causing her pains it may be reassuring to have her imaging earlier.  Should her pain persists or other GI symptoms develop I can see her back. I think she was appropriately reassured today. Note that over the several years she has been here in  [place] I have seen her as well as primary care for recurrent vague abdominal discomfort of unclear etiology, in multiple locations.

## 2011-06-21 ENCOUNTER — Other Ambulatory Visit: Payer: Self-pay | Admitting: Family Medicine

## 2011-06-21 DIAGNOSIS — Z1231 Encounter for screening mammogram for malignant neoplasm of breast: Secondary | ICD-10-CM

## 2011-07-18 ENCOUNTER — Ambulatory Visit
Admission: RE | Admit: 2011-07-18 | Discharge: 2011-07-18 | Disposition: A | Discharge status: Home / Self Care | Payer: BC Managed Care – PPO | Source: Ambulatory Visit | Attending: Family Medicine | Admitting: Family Medicine

## 2011-07-18 DIAGNOSIS — Z1231 Encounter for screening mammogram for malignant neoplasm of breast: Secondary | ICD-10-CM

## 2011-08-24 ENCOUNTER — Encounter: Payer: Self-pay | Admitting: Family Medicine

## 2011-08-24 ENCOUNTER — Ambulatory Visit (INDEPENDENT_AMBULATORY_CARE_PROVIDER_SITE_OTHER): Payer: BC Managed Care – PPO | Admitting: Family Medicine

## 2011-08-24 VITALS — BP 117/79 | HR 87 | Temp 97.3°F | Ht 60.5 in | Wt 135.0 lb

## 2011-08-24 DIAGNOSIS — R319 Hematuria, unspecified: Secondary | ICD-10-CM | POA: Insufficient documentation

## 2011-08-24 DIAGNOSIS — N390 Urinary tract infection, site not specified: Secondary | ICD-10-CM

## 2011-08-24 DIAGNOSIS — IMO0001 Reserved for inherently not codable concepts without codable children: Secondary | ICD-10-CM

## 2011-08-24 DIAGNOSIS — R350 Frequency of micturition: Secondary | ICD-10-CM

## 2011-08-24 HISTORY — DX: Hematuria, unspecified: R31.9

## 2011-08-24 LAB — POCT URINALYSIS DIPSTICK
Ketones, UA: 15
Spec Grav, UA: 1.025
Urobilinogen, UA: 0.2
pH, UA: 5

## 2011-08-24 MED ORDER — SULFAMETHOXAZOLE-TRIMETHOPRIM 800-160 MG PO TABS: 1.0000 | ORAL_TABLET | Freq: Two times a day (BID) | ORAL | Status: AC

## 2011-08-24 NOTE — Progress Notes (Signed)
OFFICE NOTE  08/24/2011  CC:  Chief Complaint  Patient presents with  . Urinary Tract Infection    frequency, pressure with urination since 5/2 (improved then returned on 5/8)     HPI: Patient is a 61 y.o. Caucasian female who is here for urinary complaints. One week hx of urinary frequency, unpleasant urethral sensation at the end of urination, urgency. Sx's waxed and waned.  Does not have any hx of prior UTIs in the last 30 yrs. ROS: no vag d/c or bleeding.  She has not used AZO.  No fever, no nausea, no flank pain.  Pertinent PMH:  No signif hx of recurrent UTIs. Microscopic hematuria--w/u revealed no obvious reason to explain it ([facility] 06/20/11) per pt.  MEDS:  Prednisolone eye drops  PE: Blood pressure 117/79, pulse 87, temperature 97.3 F (36.3 C), temperature source Temporal, height 5' 0.5" (1.537 m), weight 135 lb (61.236 kg). Gen: Alert, well appearing.  Patient is oriented to person, place, time, and situation. ABD: soft, NT, ND, BS normal.   NO HSM or bruit.  CC UA today: moderate blood, trace protein, nitrite positive, LEU small, SG 1.025, 15 mg/dl ketones, o/w normal.  IMPRESSION AND PLAN:  UTI (lower urinary tract infection) Start bactrim DS 1 tab po bid x 5d.  If all sx's resolved at 3d may stop abx at that time. Will send urine sample for c/s. Call for new/worsening symptoms.      FOLLOW UP: prn

## 2011-08-27 LAB — URINE CULTURE

## 2011-08-27 NOTE — Assessment & Plan Note (Signed)
Start bactrim DS 1 tab po bid x 5d.  If all sx's resolved at 3d may stop abx at that time. Will send urine sample for c/s. Call for new/worsening symptoms.

## 2011-09-24 ENCOUNTER — Encounter: Payer: Self-pay | Admitting: Family Medicine

## 2011-09-24 ENCOUNTER — Ambulatory Visit (HOSPITAL_BASED_OUTPATIENT_CLINIC_OR_DEPARTMENT_OTHER)
Admission: RE | Admit: 2011-09-24 | Discharge: 2011-09-24 | Disposition: A | Discharge status: Home / Self Care | Payer: BC Managed Care – PPO | Source: Ambulatory Visit | Attending: Family Medicine | Admitting: Family Medicine

## 2011-09-24 ENCOUNTER — Ambulatory Visit (INDEPENDENT_AMBULATORY_CARE_PROVIDER_SITE_OTHER): Payer: BC Managed Care – PPO | Admitting: Family Medicine

## 2011-09-24 VITALS — BP 120/81 | HR 88 | Temp 98.2°F | Ht 65.0 in | Wt 125.8 lb

## 2011-09-24 DIAGNOSIS — R1032 Left lower quadrant pain: Secondary | ICD-10-CM

## 2011-09-24 DIAGNOSIS — M25559 Pain in unspecified hip: Secondary | ICD-10-CM

## 2011-09-24 DIAGNOSIS — N390 Urinary tract infection, site not specified: Secondary | ICD-10-CM

## 2011-09-24 DIAGNOSIS — R319 Hematuria, unspecified: Secondary | ICD-10-CM

## 2011-09-24 DIAGNOSIS — E785 Hyperlipidemia, unspecified: Secondary | ICD-10-CM

## 2011-09-24 DIAGNOSIS — M25552 Pain in left hip: Secondary | ICD-10-CM

## 2011-09-24 DIAGNOSIS — D696 Thrombocytopenia, unspecified: Secondary | ICD-10-CM

## 2011-09-24 DIAGNOSIS — Z0000 Encounter for general adult medical examination without abnormal findings: Secondary | ICD-10-CM

## 2011-09-24 LAB — POCT URINALYSIS DIPSTICK
Glucose, UA: NEGATIVE
Ketones, UA: 40
Leukocytes, UA: NEGATIVE
Protein, UA: NEGATIVE
Spec Grav, UA: 1.02

## 2011-09-24 LAB — CBC
MCV: 90.1 fl (ref 78.0–100.0)
RBC: 4.58 Mil/uL (ref 3.87–5.11)
WBC: 4.5 10*3/uL (ref 4.5–10.5)

## 2011-09-24 MED ORDER — CYCLOBENZAPRINE HCL 10 MG PO TABS: 10.0000 mg | ORAL_TABLET | Freq: Every evening | ORAL | Status: AC | PRN

## 2011-09-24 NOTE — Patient Instructions (Signed)
Sacroiliac Joint Dysfunction The sacroiliac joint connects the lower part of the spine (the sacrum) with the bones of the pelvis. CAUSES  Sometimes, there is no obvious reason for sacroiliac joint dysfunction. Other times, it may occur   During pregnancy.   After injury, such as:   Car accidents.   Sport-related injuries.   Work-related injuries.   Due to one leg being shorter than the other.   Due to other conditions that affect the joints, such as:   Rheumatoid arthritis.   Gout.   Psoriasis.   Joint infection (septic arthritis).  SYMPTOMS  Symptoms may include:  Pain in the:   Lower back.   Buttocks.   Groin.   Thighs and legs.   Difficult sitting, standing, walking, lying, bending or lifting.  DIAGNOSIS  A number of tests may be used to help diagnose the cause of sacroiliac joint dysfunction, including:  Imaging tests to look for other causes of pain, including:   MRI.   CT scan.   Bone scan.   Diagnostic injection: During a special x-ray (called fluoroscopy), a needle is put into the sacroiliac joint. A numbing medicine is injected into the joint. If the pain is improved or stopped, the diagnosis of sacroiliac joint dysfunction is more likely.  TREATMENT  There are a number of types of treatment used for sacroiliac joint dysfunction, including:  Only take over-the-counter or prescription medicines for pain, discomfort, or fever as directed by your caregiver.   Medications to relax muscles.   Rest. Decreasing activity can help cut down on painful muscle spasms and allow the back to heal.   Application of heat or ice to the lower back may improve muscle spasms and soothe pain.   Brace. A special back brace, called a sacroiliac belt, can help support the joint while your back is healing.   Physical therapy can help teach comfortable positions and exercises to strengthen muscles that support the sacroiliac joint.   Cortisone injections. Injections  of steroid medicine into the joint can help decrease swelling and improve pain.   Hyaluronic acid injections. This chemical improves lubrication within the sacroiliac joint, thereby decreasing pain.   Radiofrequency ablation. A special needle is placed into the joint, where it burns away nerves that are carrying pain messages from the joint.   Surgery. Because pain occurs during movement of the joint, screws and plates may be installed in order to limit or prevent joint motion.  HOME CARE INSTRUCTIONS   Take all medications exactly as directed.   Follow instructions regarding both rest and physical activity, to avoid worsening the pain.   Do physical therapy exercises exactly as prescribed.  SEEK IMMEDIATE MEDICAL CARE IF:  You experience increasingly severe pain.   You develop new symptoms, such as numbness or tingling in your legs or feet.   You lose bladder or bowel control.  Document Released: 06/29/2008 Document Revised: 03/22/2011 Document Reviewed: 06/29/2008 [employer] Patient Information 2012 [employer]. 

## 2011-09-25 LAB — RENAL FUNCTION PANEL
CO2: 28 mEq/L (ref 19–32)
Calcium: 9.6 mg/dL (ref 8.4–10.5)
Creatinine, Ser: 0.7 mg/dL (ref 0.4–1.2)
GFR: 85.73 mL/min (ref >60.00)
Potassium: 4.5 mEq/L (ref 3.5–5.1)
Sodium: 144 mEq/L (ref 135–145)

## 2011-09-25 LAB — TSH: TSH: 0.94 u[IU]/mL (ref 0.35–5.50)

## 2011-09-25 LAB — LIPID PANEL
Cholesterol: 177 mg/dL (ref 0–200)
LDL Cholesterol: 91 mg/dL (ref 0–99)
VLDL: 11.4 mg/dL (ref 0.0–40.0)

## 2011-09-25 LAB — HEPATIC FUNCTION PANEL
AST: 26 U/L (ref 0–37)
Bilirubin, Direct: 0.1 mg/dL (ref 0.0–0.3)
Total Bilirubin: 0.7 mg/dL (ref 0.3–1.2)

## 2011-09-26 ENCOUNTER — Ambulatory Visit (INDEPENDENT_AMBULATORY_CARE_PROVIDER_SITE_OTHER): Payer: BC Managed Care – PPO | Admitting: Family Medicine

## 2011-09-26 ENCOUNTER — Encounter: Payer: Self-pay | Admitting: Family Medicine

## 2011-09-26 VITALS — BP 142/84 | HR 87 | Temp 99.0°F | Ht 65.0 in | Wt 124.8 lb

## 2011-09-26 DIAGNOSIS — Z8619 Personal history of other infectious and parasitic diseases: Secondary | ICD-10-CM | POA: Insufficient documentation

## 2011-09-26 DIAGNOSIS — D696 Thrombocytopenia, unspecified: Secondary | ICD-10-CM

## 2011-09-26 DIAGNOSIS — F4321 Adjustment disorder with depressed mood: Secondary | ICD-10-CM

## 2011-09-26 DIAGNOSIS — N390 Urinary tract infection, site not specified: Secondary | ICD-10-CM

## 2011-09-26 DIAGNOSIS — R1032 Left lower quadrant pain: Secondary | ICD-10-CM

## 2011-09-26 LAB — URINE CULTURE: Colony Count: 5000

## 2011-09-26 NOTE — Patient Instructions (Addendum)
Thrombocytopenia Thrombocytopenia is a condition in which there is an abnormally small number of platelets in your blood. Platelets are also called thrombocytes. Platelets are needed for blood clotting. CAUSES Thrombocytopenia is caused by:   Decreased production of platelets. This can be caused by:   Aplastic anemia in which your bone marrow quits making blood cells.   Cancer in the bone marrow.   Use of certain medicines, including chemotherapy.   Infection in the bone marrow.   Heavy alcohol consumption.   Increased destruction of platelets. This can be caused by:   Certain immune diseases.   Use of certain drugs.   Certain blood clotting disorders.   Certain inherited disorders.   Certain bleeding disorders.   Pregnancy.   Having an enlarged spleen (hypersplenism). In hypersplenism, the spleen gathers up platelets from circulation. This means the platelets are not available to help with blood clotting. The spleen can enlarge due to cirrhosis or other conditions.  SYMPTOMS  The symptoms of thrombocytopenia are side effects of poor blood clotting. Some of these are:  Abnormal bleeding.   Nosebleeds.   Heavy menstrual periods.   Blood in the urine or stools.   Purpura. This is a purplish discoloration in the skin produced by small bleeding vessels near the surface of the skin.   Bruising.   A rash that may be petechial. This looks like pinpoint, purplish-red spots on the skin and mucous membranes. It is caused by bleeding from small blood vessels (capillaries).  DIAGNOSIS  Your caregiver will make this diagnosis based on your exam and blood tests. Sometimes, a bone marrow study is done to look for the original cells (megakaryocytes) that make platelets. TREATMENT  Treatment depends on the cause of the condition.  Medicines may be given to help protect your platelets from being destroyed.   In some cases, a replacement (transfusion) of platelets may be required  to stop or prevent bleeding.   Sometimes, the spleen must be surgically removed.  HOME CARE INSTRUCTIONS   Check the skin and linings inside your mouth for bruising or bleeding as directed by your caregiver.   Check your sputum, urine, and stool for blood as directed by your caregiver.   Do not return to any activities that could cause bumps or bruises until your caregiver says it is okay.   Take extra care not to cut yourself when shaving or when using scissors, needles, knives, and other tools.   Take extra care not to burn yourself when ironing or cooking.   Ask your caregiver if it is okay for you to drink alcohol.   Only take over-the-counter or prescription medicines as directed by your caregiver.   Notify all your caregivers, including dentists and eye doctors, about your condition.  SEEK IMMEDIATE MEDICAL CARE IF:   You develop active bleeding from anywhere in your body.   You develop unexplained bruising or bleeding.   You have blood in your sputum, urine, or stool.  MAKE SURE YOU:  Understand these instructions.   Will watch your condition.   Will get help right away if you are not doing well or get worse.  Document Released: 04/02/2005 Document Revised: 03/22/2011 Document Reviewed: 02/02/2011 [employer] Patient Information 2012 [employer]. 

## 2011-09-27 ENCOUNTER — Encounter: Payer: Self-pay | Admitting: Family Medicine

## 2011-09-27 DIAGNOSIS — R1032 Left lower quadrant pain: Secondary | ICD-10-CM | POA: Insufficient documentation

## 2011-09-27 DIAGNOSIS — D696 Thrombocytopenia, unspecified: Secondary | ICD-10-CM

## 2011-09-27 HISTORY — DX: Thrombocytopenia, unspecified: D69.6

## 2011-09-27 LAB — CBC
HCT: 42.2 % (ref 36.0–46.0)
MCH: 29.3 pg (ref 26.0–34.0)
MCV: 87.6 fL (ref 78.0–100.0)
RDW: 14.8 % (ref 11.5–15.5)
WBC: 4.5 10*3/uL (ref 4.0–10.5)

## 2011-09-27 NOTE — Assessment & Plan Note (Signed)
worsening

## 2011-09-27 NOTE — Progress Notes (Signed)
Patient ID: Joan Lopez, female   DOB: 06-20-1950, 61 y.o.   MRN: [id number] SANTASIA REW [id number] [birth year] 09/27/2011      Progress Note-Follow Up  Subjective  Chief Complaint  Chief Complaint  Patient presents with  . odd abdominal sensations    not pain- unpleasant feeling left side    HPI  Patient is a 60 year old Caucasian female who is in today complaining of some left hip and left lower abdominal pain. In questioning she says this time some pain is more of an unpleasant feeling. It lasts most of the day although she is able to get comfortable in certain positions . She's not had any change in bowels. She denies constipation, diarrhea or bloody or tarry stool, anorexia, nausea or vomiting. She denies any urinary complaints such as hematuria or dysuria. She has her colonoscopy in the past she believes in 2005 which was negative. She's not had any fevers chills. She denies any radicular symptoms or pain, leg. No chest pain, palpitations, shortness of breath, GI or GU complaints  Past Medical History  Diagnosis Date  . Bilateral bunions   . DDD (degenerative disc disease)     spine w narrowing  . Diverticulitis   . Guttate psoriasis   . Hemorrhoids   . Arthritis   . Fuch's endothelial dystrophy   . Acquired bilateral renal cysts   . History of chicken pox   . Hyperlipidemia   . Thrombocytopenia 09/27/2011    Past Surgical History  Procedure Date  . Appendectomy   . Tubal ligation   . Cholecystectomy   . Cesarean section   . Lipoma excision     x 2, both arms  . Total abdominal hysterectomy     for fibroids  . Wrist fracture surgery     right, titanium plate and pins  . Colonoscopy 2005    [place] - diverticulosis and hemorrhoids  . Tonsillectomy     possible adenoids    Family History  Problem Relation Age of Onset  . Stroke Mother   . Heart disease Mother   . Kidney disease Father   . Breast cancer Sister 45  . Cancer Sister     ductal  carcinoma breast- had it in 1999 and just found out its in the opposite breast  . Colon cancer Maternal Aunt   . Cancer Paternal Uncle     neck tumor  . Cancer Maternal Grandmother     brain tumor  . Breast cancer Paternal Grandmother     post menopausal   . Cancer Paternal Grandmother     breast  . Stomach cancer Maternal Aunt   . Kidney cancer Cousin   . Pneumonia Maternal Grandfather     History   Social History  . Marital Status: Married    Spouse Name: N/A    Number of Children: N/A  . Years of Education: N/A   Occupational History  . Not on file.   Social History Main Topics  . Smoking status: Never Smoker   . Smokeless tobacco: Never Used  . Alcohol Use: No  . Drug Use: No  . Sexually Active: Yes   Other Topics Concern  . Not on file   Social History Narrative  . No narrative on file    Current Outpatient Prescriptions on File Prior to Visit  Medication Sig Dispense Refill  . prednisoLONE acetate (PRED FORTE) 1 % ophthalmic suspension Place 1 drop into both eyes 4 (four) times daily.  Allergies  Allergen Reactions  . Augmentin (Amoxicillin-Pot Clavulanate)     Severe yeast infection-would prefer not to take.    Review of Systems  Review of Systems  Constitutional: Negative for fever and malaise/fatigue.  HENT: Negative for congestion.   Eyes: Negative for discharge.  Respiratory: Negative for shortness of breath.   Cardiovascular: Negative for chest pain, palpitations and leg swelling.  Gastrointestinal: Positive for abdominal pain. Negative for nausea and diarrhea.  Genitourinary: Negative for dysuria.  Musculoskeletal: Positive for joint pain. Negative for falls.       Left hip  Skin: Negative for rash.  Neurological: Negative for loss of consciousness and headaches.  Endo/Heme/Allergies: Negative for polydipsia.  Psychiatric/Behavioral: Negative for depression and suicidal ideas. The patient is not nervous/anxious and does not have  insomnia.     Objective  BP 120/81  Pulse 88  Temp 98.2 F (36.8 C) (Temporal)  Ht 5\' 5"  (1.651 m)  Wt 125 lb 12.8 oz (57.063 kg)  BMI 20.93 kg/m2  SpO2 99%  Physical Exam  Physical Exam  Constitutional: She is oriented to person, place, and time and well-developed, well-nourished, and in no distress. No distress.  HENT:  Head: Normocephalic and atraumatic.  Eyes: Conjunctivae are normal.  Neck: Neck supple. No thyromegaly present.  Cardiovascular: Normal rate, regular rhythm and normal heart sounds.   No murmur heard. Pulmonary/Chest: Effort normal and breath sounds normal. She has no wheezes.  Abdominal: She exhibits no distension and no mass.  Musculoskeletal: She exhibits no edema.  Lymphadenopathy:    She has no cervical adenopathy.  Neurological: She is alert and oriented to person, place, and time.  Skin: Skin is warm and dry. No rash noted. She is not diaphoretic.  Psychiatric: Memory, affect and judgment normal.    Lab Results  Component Value Date   TSH 0.94 09/24/2011   Lab Results  Component Value Date   WBC 4.5 09/26/2011   HGB 14.1 09/26/2011   HCT 42.2 09/26/2011   MCV 87.6 09/26/2011   PLT 124* 09/26/2011   Lab Results  Component Value Date   CREATININE 0.7 09/24/2011   BUN 16 09/24/2011   NA 144 09/24/2011   K 4.5 09/24/2011   CL 106 09/24/2011   CO2 28 09/24/2011   Lab Results  Component Value Date   ALT 22 09/24/2011   AST 26 09/24/2011   ALKPHOS 48 09/24/2011   BILITOT 0.7 09/24/2011   Lab Results  Component Value Date   CHOL 177 09/24/2011   Lab Results  Component Value Date   HDL 74.50 09/24/2011   Lab Results  Component Value Date   LDLCALC 91 09/24/2011   Lab Results  Component Value Date   TRIG 57.0 09/24/2011   Lab Results  Component Value Date   CHOLHDL 2 09/24/2011     Assessment & Plan  Left lower quadrant pain Position can change the quality of the pain, encouraged moderate activity, NSAIDs in am and Flexeril qhs. Xray  unremarkable will investigate futher if symptoms do not improve.  UTI (lower urinary tract infection) Urine clear today  Thrombocytopenia worsening

## 2011-09-27 NOTE — Assessment & Plan Note (Signed)
Urine clear today. 

## 2011-09-27 NOTE — Assessment & Plan Note (Signed)
Position can change the quality of the pain, encouraged moderate activity, NSAIDs in am and Flexeril qhs. Xray unremarkable will investigate futher if symptoms do not improve.

## 2011-09-28 NOTE — Assessment & Plan Note (Signed)
Patient very anxious and nervous about her diagnosis. Repeat CBC is improved but will ask hematology to assess

## 2011-09-28 NOTE — Assessment & Plan Note (Signed)
Encouraged to continue NSAID in am and Flexeril qhs if no improvement will need referral

## 2011-09-28 NOTE — Progress Notes (Signed)
Patient ID: Joan Lopez, female   DOB: [birth year], 61 y.o.   MRN: [id number] Joan Lopez [id number] 04-30-1950 09/28/2011      Progress Note-Follow Up  Subjective  Chief Complaint  Chief Complaint  Patient presents with  . Follow-up  . Establish Care    pt has never been established    HPI  She is a 61-year-old Caucasian female who is in today accompanied by her husband for followup in completion of new patient appointment. She is very anxious about her recent blood work which showed some worsening of some thrombocytopenia. She denies any excessive bleeding but does acknowledge some easy bruising. She has no bleeding gums no bloody or tarry stool. No hematuria or frequent bleeding of any sort. He does struggle with fatigue and anxiety. As the very little. Eats maybe once to twice a day. Eats a high-fiber cereal for breakfast and weakness over for dinner every day. No chest pain, palpitations, shortness of breath, GI complaints. Does continue to have her left lower quadrant/left hip pain she has been complaining about for the last week. It is not worse or better. She is tolerating the Flexeril although it does cause sedation. No radicular symptoms are noted.  Past Medical History  Diagnosis Date  . Bilateral bunions   . DDD (degenerative disc disease)     spine w narrowing  . Diverticulitis   . Guttate psoriasis   . Hemorrhoids   . Arthritis   . Fuch's endothelial dystrophy   . Acquired bilateral renal cysts   . History of chicken pox   . Hyperlipidemia   . Thrombocytopenia 09/27/2011    Past Surgical History  Procedure Date  . Appendectomy   . Tubal ligation   . Cholecystectomy   . Cesarean section   . Lipoma excision     x 2, both arms  . Total abdominal hysterectomy     for fibroids  . Wrist fracture surgery     right, titanium plate and pins  . Colonoscopy 2005    [place] - diverticulosis and hemorrhoids  . Tonsillectomy     possible adenoids    Family  History  Problem Relation Age of Onset  . Stroke Mother   . Heart disease Mother   . Kidney disease Father   . Breast cancer Sister 88  . Cancer Sister     ductal carcinoma breast- had it in 1999 and just found out its in the opposite breast  . Colon cancer Maternal Aunt   . Cancer Paternal Uncle     neck tumor  . Cancer Maternal Grandmother     brain tumor  . Breast cancer Paternal Grandmother     post menopausal   . Cancer Paternal Grandmother     breast  . Stomach cancer Maternal Aunt   . Kidney cancer Cousin   . Pneumonia Maternal Grandfather     History   Social History  . Marital Status: Married    Spouse Name: N/A    Number of Children: N/A  . Years of Education: N/A   Occupational History  . Not on file.   Social History Main Topics  . Smoking status: Never Smoker   . Smokeless tobacco: Never Used  . Alcohol Use: No  . Drug Use: No  . Sexually Active: Yes   Other Topics Concern  . Not on file   Social History Narrative  . No narrative on file    Current Outpatient Prescriptions on File Prior  to Visit  Medication Sig Dispense Refill  . cyclobenzaprine (FLEXERIL) 10 MG tablet Take 1 tablet (10 mg total) by mouth at bedtime as needed for muscle spasms.  30 tablet  0  . prednisoLONE acetate (PRED FORTE) 1 % ophthalmic suspension Place 1 drop into both eyes 4 (four) times daily.        Allergies  Allergen Reactions  . Augmentin (Amoxicillin-Pot Clavulanate)     Severe yeast infection-would prefer not to take.    Review of Systems  Review of Systems  Constitutional: Positive for malaise/fatigue. Negative for fever.  HENT: Negative for congestion.   Eyes: Negative for discharge.  Respiratory: Negative for shortness of breath.   Cardiovascular: Negative for chest pain, palpitations and leg swelling.  Gastrointestinal: Negative for nausea, abdominal pain and diarrhea.  Genitourinary: Negative for dysuria.  Musculoskeletal: Positive for joint pain.  Negative for falls.  Skin: Negative for rash.  Neurological: Negative for loss of consciousness and headaches.  Endo/Heme/Allergies: Negative for polydipsia.  Psychiatric/Behavioral: Negative for depression and suicidal ideas. The patient is nervous/anxious. The patient does not have insomnia.     Objective  BP 142/84  Pulse 87  Temp 99 F (37.2 C) (Temporal)  Ht 5\' 5"  (1.651 m)  Wt 124 lb 12.8 oz (56.609 kg)  BMI 20.77 kg/m2  SpO2 99%  Physical Exam  Physical Exam  Constitutional: She is oriented to person, place, and time and well-developed, well-nourished, and in no distress. No distress.  HENT:  Head: Normocephalic and atraumatic.  Right Ear: External ear normal.  Left Ear: External ear normal.  Nose: Nose normal.  Mouth/Throat: Oropharynx is clear and moist. No oropharyngeal exudate.  Eyes: Conjunctivae are normal. Pupils are equal, round, and reactive to light. Right eye exhibits no discharge. Left eye exhibits no discharge. No scleral icterus.  Neck: Normal range of motion. Neck supple. No thyromegaly present.  Cardiovascular: Normal rate, regular rhythm, normal heart sounds and intact distal pulses.   No murmur heard. Pulmonary/Chest: Effort normal and breath sounds normal. No respiratory distress. She has no wheezes. She has no rales.  Abdominal: Soft. Bowel sounds are normal. She exhibits no distension and no mass. There is no tenderness.  Musculoskeletal: Normal range of motion. She exhibits no edema and no tenderness.  Lymphadenopathy:    She has no cervical adenopathy.  Neurological: She is alert and oriented to person, place, and time. She has normal reflexes. No cranial nerve deficit. Coordination normal.  Skin: Skin is warm and dry. No rash noted. She is not diaphoretic.  Psychiatric: Mood, memory and affect normal.    Lab Results  Component Value Date   TSH 0.94 09/24/2011   Lab Results  Component Value Date   WBC 4.5 09/26/2011   HGB 14.1 09/26/2011    HCT 42.2 09/26/2011   MCV 87.6 09/26/2011   PLT 124* 09/26/2011   Lab Results  Component Value Date   CREATININE 0.7 09/24/2011   BUN 16 09/24/2011   NA 144 09/24/2011   K 4.5 09/24/2011   CL 106 09/24/2011   CO2 28 09/24/2011   Lab Results  Component Value Date   ALT 22 09/24/2011   AST 26 09/24/2011   ALKPHOS 48 09/24/2011   BILITOT 0.7 09/24/2011   Lab Results  Component Value Date   CHOL 177 09/24/2011   Lab Results  Component Value Date   HDL 74.50 09/24/2011   Lab Results  Component Value Date   LDLCALC 91 09/24/2011   Lab Results  Component Value Date   TRIG 57.0 09/24/2011   Lab Results  Component Value Date   CHOLHDL 2 09/24/2011    2 Assessment & Plan  Thrombocytopenia Patient very anxious and nervous about her diagnosis. Repeat CBC is improved but will ask hematology to assess   Left lower quadrant pain Encouraged to continue NSAID in am and Flexeril qhs if no improvement will need referral  UTI (lower urinary tract infection) No growth on culture that was significant  ADJUSTMENT DISORDER WITH DEPRESSED MOOD Patient very anxious about her health today, offered reassurance

## 2011-09-28 NOTE — Assessment & Plan Note (Signed)
Patient very anxious about her health today, offered reassurance

## 2011-09-28 NOTE — Assessment & Plan Note (Signed)
No growth on culture that was significant

## 2011-10-04 ENCOUNTER — Telehealth: Payer: Self-pay | Admitting: Internal Medicine

## 2011-10-04 NOTE — Telephone Encounter (Signed)
called pt Joan Lopez;new pt appt and pt was down for ennever,pt talked to amy who eval her 6/12 blood wrk as pt would rather see [name]. pt appt made for 7/9 and she asked that i call dr [name] and cx the appt  aom

## 2011-10-04 NOTE — Telephone Encounter (Signed)
Referred by Dr. Danise Edge Dx- Mild Thrombocytopenia

## 2011-10-08 ENCOUNTER — Encounter: Payer: Self-pay | Admitting: Family Medicine

## 2011-10-08 ENCOUNTER — Ambulatory Visit (HOSPITAL_BASED_OUTPATIENT_CLINIC_OR_DEPARTMENT_OTHER)
Admission: RE | Admit: 2011-10-08 | Discharge: 2011-10-08 | Disposition: A | Discharge status: Home / Self Care | Payer: BC Managed Care – PPO | Source: Ambulatory Visit | Attending: Family Medicine | Admitting: Family Medicine

## 2011-10-08 ENCOUNTER — Ambulatory Visit (INDEPENDENT_AMBULATORY_CARE_PROVIDER_SITE_OTHER): Payer: BC Managed Care – PPO | Admitting: Family Medicine

## 2011-10-08 ENCOUNTER — Encounter (HOSPITAL_BASED_OUTPATIENT_CLINIC_OR_DEPARTMENT_OTHER): Payer: Self-pay

## 2011-10-08 ENCOUNTER — Other Ambulatory Visit (HOSPITAL_COMMUNITY)
Admission: RE | Admit: 2011-10-08 | Discharge: 2011-10-08 | Disposition: A | Discharge status: Home / Self Care | Payer: BC Managed Care – PPO | Source: Ambulatory Visit | Attending: Family Medicine | Admitting: Family Medicine

## 2011-10-08 VITALS — BP 119/81 | HR 94 | Temp 97.6°F | Ht 65.0 in | Wt 121.8 lb

## 2011-10-08 DIAGNOSIS — Z01419 Encounter for gynecological examination (general) (routine) without abnormal findings: Secondary | ICD-10-CM | POA: Insufficient documentation

## 2011-10-08 DIAGNOSIS — R1032 Left lower quadrant pain: Secondary | ICD-10-CM

## 2011-10-08 DIAGNOSIS — R634 Abnormal weight loss: Secondary | ICD-10-CM | POA: Insufficient documentation

## 2011-10-08 DIAGNOSIS — Z780 Asymptomatic menopausal state: Secondary | ICD-10-CM

## 2011-10-08 DIAGNOSIS — Z90710 Acquired absence of both cervix and uterus: Secondary | ICD-10-CM | POA: Insufficient documentation

## 2011-10-08 DIAGNOSIS — R319 Hematuria, unspecified: Secondary | ICD-10-CM | POA: Insufficient documentation

## 2011-10-08 DIAGNOSIS — N950 Postmenopausal bleeding: Secondary | ICD-10-CM

## 2011-10-08 DIAGNOSIS — D696 Thrombocytopenia, unspecified: Secondary | ICD-10-CM

## 2011-10-08 DIAGNOSIS — N390 Urinary tract infection, site not specified: Secondary | ICD-10-CM

## 2011-10-08 HISTORY — DX: Postmenopausal bleeding: N95.0

## 2011-10-08 LAB — CBC
Hemoglobin: 13.7 g/dL (ref 12.0–15.0)
MCHC: 33.2 g/dL (ref 30.0–36.0)
MCV: 90.3 fl (ref 78.0–100.0)
Platelets: 143 10*3/uL — ABNORMAL LOW (ref 150.0–400.0)
RDW: 13.8 % (ref 11.5–14.6)

## 2011-10-08 LAB — POCT URINALYSIS DIPSTICK
Ketones, UA: 15
Protein, UA: 100
Spec Grav, UA: >=1.03
pH, UA: 5.5

## 2011-10-08 MED ORDER — IOHEXOL 300 MG/ML  SOLN
100.0000 mL | Freq: Once | INTRAMUSCULAR | Status: AC | PRN
  Administered 2011-10-08: 100 mL via INTRAVENOUS

## 2011-10-08 NOTE — Assessment & Plan Note (Signed)
Hematuria noted today but no other sign of acute infection, will send urine for culture to further investigate. Treat based on results.

## 2011-10-08 NOTE — Assessment & Plan Note (Signed)
Not responding to conservative measures and treatment of musculosketal pain with Flexeril, stretching etc. Will arrange for CT abd/ pelvis today due to her persistent and numerous symptoms

## 2011-10-08 NOTE — Assessment & Plan Note (Signed)
Patient c/o 2 episodes of blood in bowl with urination today without any other urinary symptoms and occuring after she goes running. Pap smear done today despite h/o hysterectomy due to her symptoms and may need referral gyn for further evaluation

## 2011-10-08 NOTE — Progress Notes (Signed)
Patient ID: Joan Lopez, female   DOB: Aug 29, 1950, 61 y.o.   MRN: [id number] Joan Lopez [id number] [birth year] 10/08/2011      Progress Note-Follow Up  Subjective  Chief Complaint  Chief Complaint  Patient presents with  . Hematuria    HPI  Patient is a 61 year old Caucasian female who is in today for evaluation of bleeding. She notes after running today she's had 2 episodes of pinkish to reddish bleeding when she urinates. She denies any dysuria, frequency, urgency but notes some redness on the tissue and in the bowl. She's not had a bowel movement at the same time. She denies any lesions, itching, discharge. She denies any fevers or chills. No change in bowels. She continues to have her left lower quadrant pain. Notes that she if she lies on her left side she can get the pain to ease up when she changes position it worsens again. She has been using Flexeril nightly as directed ande she sleeps better the pain is persistent. She continues to struggle with increased fatigue. Denies any chest pain, palpitations, shortness of breath. She has had great deal of weight loss in the last several months although she does acknowledge she has been dieting and trying to exercise and believes that has been helpful. She continues to feel very anxious about her state of health.  Past Medical History  Diagnosis Date  . Bilateral bunions   . DDD (degenerative disc disease)     spine w narrowing  . Diverticulitis   . Guttate psoriasis   . Hemorrhoids   . Arthritis   . Fuch's endothelial dystrophy   . Acquired bilateral renal cysts   . History of chicken pox   . Hyperlipidemia   . Thrombocytopenia 09/27/2011  . Post-menopausal bleeding 10/08/2011    Past Surgical History  Procedure Date  . Appendectomy   . Tubal ligation   . Cholecystectomy   . Cesarean section   . Lipoma excision     x 2, both arms  . Wrist fracture surgery     right, titanium plate and pins  . Colonoscopy 2005   [place] - diverticulosis and hemorrhoids  . Tonsillectomy     possible adenoids  . Total abdominal hysterectomy     for fibroids    Family History  Problem Relation Age of Onset  . Stroke Mother   . Heart disease Mother   . Kidney disease Father   . Breast cancer Sister 28  . Cancer Sister     ductal carcinoma breast- had it in 1999 and just found out its in the opposite breast  . Colon cancer Maternal Aunt   . Cancer Paternal Uncle     neck tumor  . Cancer Maternal Grandmother     brain tumor  . Breast cancer Paternal Grandmother     post menopausal   . Cancer Paternal Grandmother     breast  . Stomach cancer Maternal Aunt   . Kidney cancer Cousin   . Pneumonia Maternal Grandfather     History   Social History  . Marital Status: Married    Spouse Name: N/A    Number of Children: N/A  . Years of Education: N/A   Occupational History  . Not on file.   Social History Main Topics  . Smoking status: Never Smoker   . Smokeless tobacco: Never Used  . Alcohol Use: No  . Drug Use: No  . Sexually Active: Yes   Other Topics Concern  .  Not on file   Social History Narrative  . No narrative on file    Current Outpatient Prescriptions on File Prior to Visit  Medication Sig Dispense Refill  . prednisoLONE acetate (PRED FORTE) 1 % ophthalmic suspension Place 1 drop into both eyes 4 (four) times daily.       No current facility-administered medications on file prior to visit.    Allergies  Allergen Reactions  . Augmentin (Amoxicillin-Pot Clavulanate)     Severe yeast infection-would prefer not to take.    Review of Systems  Review of Systems  Constitutional: Positive for weight loss. Negative for fever and malaise/fatigue.  HENT: Negative for congestion.   Eyes: Negative for discharge.  Respiratory: Negative for shortness of breath.   Cardiovascular: Negative for chest pain, palpitations and leg swelling.  Gastrointestinal: Positive for abdominal pain.  Negative for nausea and diarrhea.       LLQ pain, worse with certain position changes  Genitourinary: Negative for dysuria.  Musculoskeletal: Positive for joint pain. Negative for falls.       Left hip pain, intermittent, can change with position changes  Skin: Negative for rash.  Neurological: Negative for loss of consciousness and headaches.  Endo/Heme/Allergies: Negative for polydipsia.  Psychiatric/Behavioral: Negative for depression and suicidal ideas. The patient is not nervous/anxious and does not have insomnia.     Objective  BP 119/81  Pulse 94  Temp 97.6 F (36.4 C) (Temporal)  Ht 5\' 5"  (1.651 m)  Wt 121 lb 12.8 oz (55.248 kg)  BMI 20.27 kg/m2  SpO2 100%  Physical Exam  Physical Exam  Constitutional: She is oriented to person, place, and time and well-developed, well-nourished, and in no distress. No distress.  HENT:  Head: Normocephalic and atraumatic.  Eyes: Conjunctivae are normal.  Neck: Neck supple. No thyromegaly present.  Cardiovascular: Normal rate, regular rhythm and normal heart sounds.   No murmur heard. Pulmonary/Chest: Effort normal and breath sounds normal. She has no wheezes.  Abdominal: Soft. Bowel sounds are normal. She exhibits no distension and no mass. There is no tenderness. There is no rebound and no guarding.       Increased fullness in rlq but no discrete mass  Genitourinary: Vagina normal, right adnexa normal and left adnexa normal. No vaginal discharge found.       Cervix surgically absent. No blood in vault  Musculoskeletal: She exhibits no edema.  Lymphadenopathy:    She has no cervical adenopathy.  Neurological: She is alert and oriented to person, place, and time.  Skin: Skin is warm and dry. No rash noted. She is not diaphoretic.  Psychiatric: Memory, affect and judgment normal.    Lab Results  Component Value Date   TSH 0.94 09/24/2011   Lab Results  Component Value Date   WBC 4.5 09/26/2011   HGB 14.1 09/26/2011   HCT 42.2  09/26/2011   MCV 87.6 09/26/2011   PLT 124* 09/26/2011   Lab Results  Component Value Date   CREATININE 0.7 09/24/2011   BUN 16 09/24/2011   NA 144 09/24/2011   K 4.5 09/24/2011   CL 106 09/24/2011   CO2 28 09/24/2011   Lab Results  Component Value Date   ALT 22 09/24/2011   AST 26 09/24/2011   ALKPHOS 48 09/24/2011   BILITOT 0.7 09/24/2011   Lab Results  Component Value Date   CHOL 177 09/24/2011   Lab Results  Component Value Date   HDL 74.50 09/24/2011   Lab Results  Component  Value Date   LDLCALC 91 09/24/2011   Lab Results  Component Value Date   TRIG 57.0 09/24/2011   Lab Results  Component Value Date   CHOLHDL 2 09/24/2011     Assessment & Plan  Thrombocytopenia Repeat a CBC today  Left lower quadrant pain Not responding to conservative measures and treatment of musculosketal pain with Flexeril, stretching etc. Will arrange for CT abd/ pelvis today due to her persistent and numerous symptoms  UTI (lower urinary tract infection) Hematuria noted today but no other sign of acute infection, will send urine for culture to further investigate. Treat based on results.  Post-menopausal bleeding Patient c/o 2 episodes of blood in bowl with urination today without any other urinary symptoms and occuring after she goes running. Pap smear done today despite h/o hysterectomy due to her symptoms and may need referral gyn for further evaluation

## 2011-10-08 NOTE — Assessment & Plan Note (Signed)
Repeat a CBC today

## 2011-10-09 ENCOUNTER — Ambulatory Visit: Payer: BC Managed Care – PPO | Admitting: Hematology & Oncology

## 2011-10-09 ENCOUNTER — Other Ambulatory Visit: Payer: BC Managed Care – PPO | Admitting: Lab

## 2011-10-09 ENCOUNTER — Ambulatory Visit: Payer: BC Managed Care – PPO

## 2011-10-16 ENCOUNTER — Encounter: Payer: Self-pay | Admitting: Family Medicine

## 2011-10-16 ENCOUNTER — Ambulatory Visit (INDEPENDENT_AMBULATORY_CARE_PROVIDER_SITE_OTHER): Payer: BC Managed Care – PPO | Admitting: Family Medicine

## 2011-10-16 ENCOUNTER — Ambulatory Visit: Payer: BC Managed Care – PPO | Admitting: Family Medicine

## 2011-10-16 VITALS — BP 108/74 | HR 95 | Temp 97.8°F | Ht 65.0 in | Wt 119.8 lb

## 2011-10-16 DIAGNOSIS — R319 Hematuria, unspecified: Secondary | ICD-10-CM

## 2011-10-16 DIAGNOSIS — D696 Thrombocytopenia, unspecified: Secondary | ICD-10-CM

## 2011-10-16 DIAGNOSIS — K5900 Constipation, unspecified: Secondary | ICD-10-CM | POA: Insufficient documentation

## 2011-10-16 DIAGNOSIS — R1032 Left lower quadrant pain: Secondary | ICD-10-CM

## 2011-10-16 DIAGNOSIS — N950 Postmenopausal bleeding: Secondary | ICD-10-CM

## 2011-10-16 HISTORY — DX: Constipation, unspecified: K59.00

## 2011-10-16 LAB — POCT URINALYSIS DIPSTICK
Blood, UA: NEGATIVE
Glucose, UA: NEGATIVE
Spec Grav, UA: >=1.03

## 2011-10-16 NOTE — Patient Instructions (Signed)
Constipation in Adults Constipation is having fewer than 2 bowel movements per week. Usually, the stools are hard. As we grow older, constipation is more common. If you try to fix constipation with laxatives, the problem may get worse. This is because laxatives taken over a long period of time make the colon muscles weaker. A low-fiber diet, not taking in enough fluids, and taking some medicines may make these problems worse. MEDICATIONS THAT MAY CAUSE CONSTIPATION  Water pills (diuretics).   Calcium channel blockers (used to control blood pressure and for the heart).   Certain pain medicines (narcotics).   Anticholinergics.   Anti-inflammatory agents.   Antacids that contain aluminum.  DISEASES THAT CONTRIBUTE TO CONSTIPATION  Diabetes.   Parkinson's disease.   Dementia.   Stroke.   Depression.   Illnesses that cause problems with salt and water metabolism.  HOME CARE INSTRUCTIONS   Constipation is usually best cared for without medicines. Increasing dietary fiber and eating more fruits and vegetables is the best way to manage constipation.   Slowly increase fiber intake to 25 to 38 grams per day. Whole grains, fruits, vegetables, and legumes are good sources of fiber. A dietitian can further help you incorporate high-fiber foods into your diet.   Drink enough water and fluids to keep your urine clear or pale yellow.   A fiber supplement may be added to your diet if you cannot get enough fiber from foods.   Increasing your activities also helps improve regularity.   Suppositories, as suggested by your caregiver, will also help. If you are using antacids, such as aluminum or calcium containing products, it will be helpful to switch to products containing magnesium if your caregiver says it is okay.   If you have been given a liquid injection (enema) today, this is only a temporary measure. It should not be relied on for treatment of longstanding (chronic) constipation.    Stronger measures, such as magnesium sulfate, should be avoided if possible. This may cause uncontrollable diarrhea. Using magnesium sulfate may not allow you time to make it to the bathroom.  SEEK IMMEDIATE MEDICAL CARE IF:   There is bright red blood in the stool.   The constipation stays for more than 4 days.   There is belly (abdominal) or rectal pain.   You do not seem to be getting better.   You have any questions or concerns.  MAKE SURE YOU:   Understand these instructions.   Will watch your condition.   Will get help right away if you are not doing well or get worse.  Document Released: 12/30/2003 Document Revised: 03/22/2011 Document Reviewed: 03/06/2011 [facility] Patient Information 2012 [place], [place].  Metamucil in 6-8 oz of water and drink quick  Start a yogurt or probiotic daily such as Align caps daily

## 2011-10-16 NOTE — Progress Notes (Signed)
Patient ID: Joan Lopez, female   DOB: 08/01/1950, 61 y.o.   MRN: [id number] PARISS HOMMES [id number] [birth year] 10/16/2011      Progress Note-Follow Up  Subjective  Chief Complaint  Chief Complaint  Patient presents with  . Follow-up    1 month    HPI  Patient is a 61 yo caucasian female in today for reevaluation of her llq pain. He notes it is significantly better since we did a bowel cleanse. She used to prune juice with the milk of magnesia and a Dulcolax suppository and had a good bowel movement. Since then her left lower quadrant pain is greatly improved. She says it's barely noticeable most days. She has gone back to running without any difficulty. Immature he has not recurred. She denies any fevers, chills, urinary symptoms. Her bowels are moving daily without difficulty at this time. Overall she says she feels much better. No chest pain or palpitations, fevers, chills, shortness of breath or other concerns are noted today.  Past Medical History  Diagnosis Date  . Bilateral bunions   . DDD (degenerative disc disease)     spine w narrowing  . Diverticulitis   . Guttate psoriasis   . Hemorrhoids   . Arthritis   . Fuch's endothelial dystrophy   . Acquired bilateral renal cysts   . History of chicken pox   . Hyperlipidemia   . Thrombocytopenia 09/27/2011  . Post-menopausal bleeding 10/08/2011  . Constipation 10/16/2011  . Hematuria 08/24/2011    Past Surgical History  Procedure Date  . Appendectomy   . Tubal ligation   . Cholecystectomy   . Cesarean section   . Lipoma excision     x 2, both arms  . Wrist fracture surgery     right, titanium plate and pins  . Colonoscopy 2005    [place] - diverticulosis and hemorrhoids  . Tonsillectomy     possible adenoids  . Total abdominal hysterectomy     for fibroids    Family History  Problem Relation Age of Onset  . Stroke Mother   . Heart disease Mother   . Kidney disease Father   . Breast cancer Sister 56  .  Cancer Sister     ductal carcinoma breast- had it in 1999 and just found out its in the opposite breast  . Colon cancer Maternal Aunt   . Cancer Paternal Uncle     neck tumor  . Cancer Maternal Grandmother     brain tumor  . Breast cancer Paternal Grandmother     post menopausal   . Cancer Paternal Grandmother     breast  . Stomach cancer Maternal Aunt   . Kidney cancer Cousin   . Pneumonia Maternal Grandfather     History   Social History  . Marital Status: Married    Spouse Name: N/A    Number of Children: N/A  . Years of Education: N/A   Occupational History  . Not on file.   Social History Main Topics  . Smoking status: Never Smoker   . Smokeless tobacco: Never Used  . Alcohol Use: No  . Drug Use: No  . Sexually Active: Yes   Other Topics Concern  . Not on file   Social History Narrative  . No narrative on file    Current Outpatient Prescriptions on File Prior to Visit  Medication Sig Dispense Refill  . prednisoLONE acetate (PRED FORTE) 1 % ophthalmic suspension Place 1 drop into both eyes  4 (four) times daily.        Allergies  Allergen Reactions  . Augmentin (Amoxicillin-Pot Clavulanate)     Severe yeast infection-would prefer not to take.    Review of Systems  Review of Systems  Constitutional: Negative for fever and malaise/fatigue.  HENT: Negative for congestion.   Eyes: Negative for discharge.  Respiratory: Negative for shortness of breath.   Cardiovascular: Negative for chest pain, palpitations and leg swelling.  Gastrointestinal: Negative for nausea, abdominal pain and diarrhea.  Genitourinary: Negative for dysuria.  Musculoskeletal: Negative for falls.  Skin: Negative for rash.  Neurological: Negative for loss of consciousness and headaches.  Endo/Heme/Allergies: Negative for polydipsia.  Psychiatric/Behavioral: Negative for depression and suicidal ideas. The patient is not nervous/anxious and does not have insomnia.      Objective  BP 108/74  Pulse 95  Temp 97.8 F (36.6 C) (Temporal)  Ht 5\' 5"  (1.651 m)  Wt 119 lb 12.8 oz (54.341 kg)  BMI 19.94 kg/m2  SpO2 99%  Physical Exam  Physical Exam  Constitutional: She is oriented to person, place, and time and well-developed, well-nourished, and in no distress. No distress.  HENT:  Head: Normocephalic and atraumatic.  Eyes: Conjunctivae are normal.  Neck: Neck supple. No thyromegaly present.  Cardiovascular: Normal rate, regular rhythm and normal heart sounds.   No murmur heard. Pulmonary/Chest: Effort normal and breath sounds normal. She has no wheezes.  Abdominal: She exhibits no distension and no mass.  Musculoskeletal: She exhibits no edema.  Lymphadenopathy:    She has no cervical adenopathy.  Neurological: She is alert and oriented to person, place, and time.  Skin: Skin is warm and dry. No rash noted. She is not diaphoretic.  Psychiatric: Memory, affect and judgment normal.    Lab Results  Component Value Date   TSH 0.94 09/24/2011   Lab Results  Component Value Date   WBC 5.8 10/08/2011   HGB 13.7 10/08/2011   HCT 41.4 10/08/2011   MCV 90.3 10/08/2011   PLT 143.0* 10/08/2011   Lab Results  Component Value Date   CREATININE 0.7 09/24/2011   BUN 16 09/24/2011   NA 144 09/24/2011   K 4.5 09/24/2011   CL 106 09/24/2011   CO2 28 09/24/2011   Lab Results  Component Value Date   ALT 22 09/24/2011   AST 26 09/24/2011   ALKPHOS 48 09/24/2011   BILITOT 0.7 09/24/2011   Lab Results  Component Value Date   CHOL 177 09/24/2011   Lab Results  Component Value Date   HDL 74.50 09/24/2011   Lab Results  Component Value Date   LDLCALC 91 09/24/2011   Lab Results  Component Value Date   TRIG 57.0 09/24/2011   Lab Results  Component Value Date   CHOLHDL 2 09/24/2011     Assessment & Plan  Constipation llq greatly improved since she took her prune juice with MOM and a Dulcolax suppository simultaneously and had good results. She is  encouraged to add Metamucil and a probiotic daily and let us know if symptoms worsen again  Thrombocytopenia Improved with recent labs, will repeat in a month and patient agrees to cancer her hematology referral at this time  Post-menopausal bleeding Pap normal and no recurrence, patient feels confident the blood was in her urine, will continue to monitor for now and may need gyn referral if persists  Left lower quadrant pain improved  Hematuria Resolved today, request urology records

## 2011-10-16 NOTE — Assessment & Plan Note (Signed)
improved

## 2011-10-16 NOTE — Assessment & Plan Note (Signed)
Pap normal and no recurrence, patient feels confident the blood was in her urine, will continue to monitor for now and may need gyn referral if persists

## 2011-10-16 NOTE — Assessment & Plan Note (Signed)
Resolved today, request urology records

## 2011-10-16 NOTE — Assessment & Plan Note (Signed)
Improved with recent labs, will repeat in a month and patient agrees to cancer her hematology referral at this time

## 2011-10-16 NOTE — Assessment & Plan Note (Signed)
llq greatly improved since she took her prune juice with MOM and a Dulcolax suppository simultaneously and had good results. She is encouraged to add Metamucil and a probiotic daily and let us know if symptoms worsen again

## 2011-10-19 ENCOUNTER — Telehealth: Payer: Self-pay | Admitting: Internal Medicine

## 2011-10-19 NOTE — Telephone Encounter (Signed)
patient called and left vm that she was not keeping 10/23/11 appt    aom

## 2011-10-22 ENCOUNTER — Ambulatory Visit: Payer: BC Managed Care – PPO | Admitting: Family Medicine

## 2011-10-23 ENCOUNTER — Ambulatory Visit: Payer: BC Managed Care – PPO | Admitting: Internal Medicine

## 2011-10-23 ENCOUNTER — Ambulatory Visit: Payer: BC Managed Care – PPO

## 2011-10-23 ENCOUNTER — Other Ambulatory Visit: Payer: BC Managed Care – PPO

## 2011-10-24 NOTE — Progress Notes (Signed)
No show

## 2011-11-01 ENCOUNTER — Telehealth: Payer: Self-pay

## 2011-11-01 NOTE — Telephone Encounter (Signed)
Opened in error

## 2011-11-14 ENCOUNTER — Encounter: Payer: Self-pay | Admitting: Family Medicine

## 2011-11-14 ENCOUNTER — Ambulatory Visit (INDEPENDENT_AMBULATORY_CARE_PROVIDER_SITE_OTHER): Payer: BC Managed Care – PPO | Admitting: Family Medicine

## 2011-11-14 VITALS — BP 112/75 | HR 89 | Temp 97.6°F | Ht 60.0 in | Wt 116.0 lb

## 2011-11-14 DIAGNOSIS — Z789 Other specified health status: Secondary | ICD-10-CM

## 2011-11-14 DIAGNOSIS — K5900 Constipation, unspecified: Secondary | ICD-10-CM

## 2011-11-14 DIAGNOSIS — N950 Postmenopausal bleeding: Secondary | ICD-10-CM

## 2011-11-14 DIAGNOSIS — D696 Thrombocytopenia, unspecified: Secondary | ICD-10-CM

## 2011-11-14 DIAGNOSIS — Z7182 Exercise counseling: Secondary | ICD-10-CM

## 2011-11-14 DIAGNOSIS — IMO0001 Reserved for inherently not codable concepts without codable children: Secondary | ICD-10-CM

## 2011-11-14 DIAGNOSIS — R109 Unspecified abdominal pain: Secondary | ICD-10-CM

## 2011-11-14 HISTORY — DX: Exercise counseling: Z71.82

## 2011-11-14 LAB — CBC
HCT: 38.8 % (ref 36.0–46.0)
Hemoglobin: 12.9 g/dL (ref 12.0–15.0)
RBC: 4.27 Mil/uL (ref 3.87–5.11)
WBC: 4.4 10*3/uL — ABNORMAL LOW (ref 4.5–10.5)

## 2011-11-14 NOTE — Assessment & Plan Note (Signed)
Repeat cbc today, no symptoms of concern

## 2011-11-14 NOTE — Assessment & Plan Note (Signed)
She reports great improvement and only mild, occasional discomfort

## 2011-11-14 NOTE — Progress Notes (Signed)
Patient ID: Joan Lopez, female   DOB: [birth year], 61 y.o.   MRN: [id number] Joan Lopez [id number] January 09, 1951 11/14/2011      Progress Note-Follow Up  Subjective  Chief Complaint  Chief Complaint  Patient presents with  . Follow-up    1 month     HPI  Patient is a 60 year old Caucasian female who is in today for followup on abdominal pain. Overall she's doing great. She is training for the marathon in November and is running roughly 6 miles at a time at this point. Continues to watch her diet and has lost weight and feels good about her physical condition. She denies any recent illness, fevers, chills, chest pain, palpitations, GI or GU complaints. She's had no further postmenopausal bleeding. She's not struggling with constipation. She notes her mild left lower quadrant discomfort is largely unremarkable and occurs infrequently at this time. She denies anorexia or any other recent concerns.  Past Medical History  Diagnosis Date  . Bilateral bunions   . DDD (degenerative disc disease)     spine w narrowing  . Diverticulitis   . Guttate psoriasis   . Hemorrhoids   . Arthritis   . Fuch's endothelial dystrophy   . Acquired bilateral renal cysts   . History of chicken pox   . Hyperlipidemia   . Thrombocytopenia 09/27/2011  . Post-menopausal bleeding 10/08/2011  . Constipation 10/16/2011  . Hematuria 08/24/2011  . Exercise counseling 11/14/2011    Past Surgical History  Procedure Date  . Appendectomy   . Tubal ligation   . Cholecystectomy   . Cesarean section   . Lipoma excision     x 2, both arms  . Wrist fracture surgery     right, titanium plate and pins  . Colonoscopy 2005    [place] - diverticulosis and hemorrhoids  . Tonsillectomy     possible adenoids  . Total abdominal hysterectomy     for fibroids    Family History  Problem Relation Age of Onset  . Stroke Mother   . Heart disease Mother   . Kidney disease Father   . Breast cancer Sister 44  .  Cancer Sister     ductal carcinoma breast- had it in 1999 and just found out its in the opposite breast  . Colon cancer Maternal Aunt   . Cancer Paternal Uncle     neck tumor  . Cancer Maternal Grandmother     brain tumor  . Breast cancer Paternal Grandmother     post menopausal   . Cancer Paternal Grandmother     breast  . Stomach cancer Maternal Aunt   . Kidney cancer Cousin   . Pneumonia Maternal Grandfather     History   Social History  . Marital Status: Married    Spouse Name: N/A    Number of Children: N/A  . Years of Education: N/A   Occupational History  . Not on file.   Social History Main Topics  . Smoking status: Never Smoker   . Smokeless tobacco: Never Used  . Alcohol Use: No  . Drug Use: No  . Sexually Active: Yes   Other Topics Concern  . Not on file   Social History Narrative  . No narrative on file    Current Outpatient Prescriptions on File Prior to Visit  Medication Sig Dispense Refill  . Multiple Vitamin (MULTIVITAMIN) tablet Take 1 tablet by mouth daily.      . prednisoLONE acetate (PRED FORTE) 1 %  ophthalmic suspension Place 1 drop into both eyes 4 (four) times daily.        Allergies  Allergen Reactions  . Augmentin (Amoxicillin-Pot Clavulanate)     Severe yeast infection-would prefer not to take.    Review of Systems  Review of Systems  Constitutional: Negative for fever and malaise/fatigue.  HENT: Negative for congestion.   Eyes: Negative for discharge.  Respiratory: Negative for shortness of breath.   Cardiovascular: Negative for chest pain, palpitations and leg swelling.  Gastrointestinal: Positive for abdominal pain. Negative for nausea and diarrhea.       Very rare, mild LLQ discomfort  Genitourinary: Negative for dysuria.  Musculoskeletal: Negative for falls.  Skin: Negative for rash.  Neurological: Negative for loss of consciousness and headaches.  Endo/Heme/Allergies: Negative for polydipsia.  Psychiatric/Behavioral:  Negative for depression and suicidal ideas. The patient is not nervous/anxious and does not have insomnia.     Objective  BP 112/75  Pulse 89  Temp 97.6 F (36.4 C) (Temporal)  Ht 5' (1.524 m)  Wt 116 lb (52.617 kg)  BMI 22.65 kg/m2  SpO2 99%  Physical Exam  Physical Exam  Constitutional: She is oriented to person, place, and time and well-developed, well-nourished, and in no distress. No distress.  HENT:  Head: Normocephalic and atraumatic.  Eyes: Conjunctivae are normal.  Neck: Neck supple. No thyromegaly present.  Cardiovascular: Normal rate, regular rhythm and normal heart sounds.   No murmur heard. Pulmonary/Chest: Effort normal and breath sounds normal. She has no wheezes.  Abdominal: She exhibits no distension and no mass.  Musculoskeletal: She exhibits no edema.  Lymphadenopathy:    She has no cervical adenopathy.  Neurological: She is alert and oriented to person, place, and time.  Skin: Skin is warm and dry. No rash noted. She is not diaphoretic.  Psychiatric: Memory, affect and judgment normal.    Lab Results  Component Value Date   TSH 0.94 09/24/2011   Lab Results  Component Value Date   WBC 5.8 10/08/2011   HGB 13.7 10/08/2011   HCT 41.4 10/08/2011   MCV 90.3 10/08/2011   PLT 143.0* 10/08/2011   Lab Results  Component Value Date   CREATININE 0.7 09/24/2011   BUN 16 09/24/2011   NA 144 09/24/2011   K 4.5 09/24/2011   CL 106 09/24/2011   CO2 28 09/24/2011   Lab Results  Component Value Date   ALT 22 09/24/2011   AST 26 09/24/2011   ALKPHOS 48 09/24/2011   BILITOT 0.7 09/24/2011   Lab Results  Component Value Date   CHOL 177 09/24/2011   Lab Results  Component Value Date   HDL 74.50 09/24/2011   Lab Results  Component Value Date   LDLCALC 91 09/24/2011   Lab Results  Component Value Date   TRIG 57.0 09/24/2011   Lab Results  Component Value Date   CHOLHDL 2 09/24/2011     Assessment & Plan  INGUINAL PAIN, LEFT She reports great improvement  and only mild, occasional discomfort  Post-menopausal bleeding Follows with GYN no recurrent episodes  Constipation resolved with diet and exercise  Thrombocytopenia Repeat cbc today, no symptoms of concern  Exercise counseling She is training for [employer] and is doing well, up to 6 miles a day the race is in November. EKG is unremarkable today, NSR will continue to monitor and she will report any concerning symptoms

## 2011-11-14 NOTE — Assessment & Plan Note (Signed)
Follows with GYN no recurrent episodes

## 2011-11-14 NOTE — Assessment & Plan Note (Signed)
resolved with diet and exercise

## 2011-11-14 NOTE — Assessment & Plan Note (Signed)
She is training for [employer] and is doing well, up to 6 miles a day the race is in November. EKG is unremarkable today, NSR will continue to monitor and she will report any concerning symptoms

## 2011-12-26 ENCOUNTER — Ambulatory Visit: Payer: BC Managed Care – PPO | Admitting: Family Medicine

## 2011-12-26 ENCOUNTER — Encounter: Payer: Self-pay | Admitting: Family Medicine

## 2011-12-26 ENCOUNTER — Ambulatory Visit (INDEPENDENT_AMBULATORY_CARE_PROVIDER_SITE_OTHER): Payer: BC Managed Care – PPO | Admitting: Family Medicine

## 2011-12-26 VITALS — BP 115/79 | HR 75 | Temp 97.2°F | Ht 60.0 in | Wt 111.1 lb

## 2011-12-26 DIAGNOSIS — R1032 Left lower quadrant pain: Secondary | ICD-10-CM

## 2011-12-26 DIAGNOSIS — E860 Dehydration: Secondary | ICD-10-CM

## 2011-12-26 DIAGNOSIS — D709 Neutropenia, unspecified: Secondary | ICD-10-CM

## 2011-12-26 DIAGNOSIS — Z7182 Exercise counseling: Secondary | ICD-10-CM

## 2011-12-26 HISTORY — DX: Dehydration: E86.0

## 2011-12-26 LAB — CBC
HCT: 37.4 % (ref 36.0–46.0)
Hemoglobin: 12.5 g/dL (ref 12.0–15.0)
MCHC: 33.4 g/dL (ref 30.0–36.0)
MCV: 91.8 fl (ref 78.0–100.0)
RDW: 14.4 % (ref 11.5–14.6)

## 2011-12-26 NOTE — Patient Instructions (Addendum)
Dehydration, Adult Dehydration is when you lose more fluids from the body than you take in. Vital organs like the kidneys, brain, and heart cannot function without a proper amount of fluids and salt. Any loss of fluids from the body can cause dehydration.  CAUSES   Vomiting.   Diarrhea.   Excessive sweating.   Excessive urine output.   Fever.  SYMPTOMS  Mild dehydration  Thirst.   Dry lips.   Slightly dry mouth.  Moderate dehydration  Very dry mouth.   Sunken eyes.   Skin does not bounce back quickly when lightly pinched and released.   Dark urine and decreased urine production.   Decreased tear production.   Headache.  Severe dehydration  Very dry mouth.   Extreme thirst.   Rapid, weak pulse (more than 100 beats per minute at rest).   Cold hands and feet.   Not able to sweat in spite of heat and temperature.   Rapid breathing.   Blue lips.   Confusion and lethargy.   Difficulty being awakened.   Minimal urine production.   No tears.  DIAGNOSIS  Your caregiver will diagnose dehydration based on your symptoms and your exam. Blood and urine tests will help confirm the diagnosis. The diagnostic evaluation should also identify the cause of dehydration. TREATMENT  Treatment of mild or moderate dehydration can often be done at home by increasing the amount of fluids that you drink. It is best to drink small amounts of fluid more often. Drinking too much at one time can make vomiting worse. Refer to the home care instructions below. Severe dehydration needs to be treated at the hospital where you will probably be given intravenous (IV) fluids that contain water and electrolytes. HOME CARE INSTRUCTIONS   Ask your caregiver about specific rehydration instructions.   Drink enough fluids to keep your urine clear or pale yellow.   Drink small amounts frequently if you have nausea and vomiting.   Eat as you normally do.   Avoid:   Foods or drinks high in  sugar.   Carbonated drinks.   Juice.   Extremely hot or cold fluids.   Drinks with caffeine.   Fatty, greasy foods.   Alcohol.   Tobacco.   Overeating.   Gelatin desserts.   Wash your hands well to avoid spreading bacteria and viruses.   Only take over-the-counter or prescription medicines for pain, discomfort, or fever as directed by your caregiver.   Ask your caregiver if you should continue all prescribed and over-the-counter medicines.   Keep all follow-up appointments with your caregiver.  SEEK MEDICAL CARE IF:  You have abdominal pain and it increases or stays in one area (localizes).   You have a rash, stiff neck, or severe headache.   You are irritable, sleepy, or difficult to awaken.   You are weak, dizzy, or extremely thirsty.  SEEK IMMEDIATE MEDICAL CARE IF:   You are unable to keep fluids down or you get worse despite treatment.   You have frequent episodes of vomiting or diarrhea.   You have blood or green matter (bile) in your vomit.   You have blood in your stool or your stool looks black and tarry.   You have not urinated in 6 to 8 hours, or you have only urinated a small amount of very dark urine.   You have a fever.   You faint.  MAKE SURE YOU:   Understand these instructions.   Will watch your condition.     Will get help right away if you are not doing well or get worse.  Document Released: 04/02/2005 Document Revised: 03/22/2011 Document Reviewed: 11/20/2010 Exit[employer] Patient Information 2012 [employer]. 

## 2011-12-26 NOTE — Assessment & Plan Note (Signed)
Ran roughly 3-4 miles today and 14 miles over the weekend on Sunday. Will check a renal panel today, encouraged increased hydration

## 2011-12-27 LAB — RENAL FUNCTION PANEL
BUN: 16 mg/dL (ref 6–23)
CO2: 31 mEq/L (ref 19–32)
Creatinine, Ser: 0.7 mg/dL (ref 0.4–1.2)
GFR: 91.41 mL/min (ref >60.00)
Glucose, Bld: 76 mg/dL (ref 70–99)
Phosphorus: 3.7 mg/dL (ref 2.3–4.6)

## 2011-12-27 NOTE — Progress Notes (Signed)
Patient ID: Joan Lopez, female   DOB: August 05, 1950, 61 y.o.   MRN: [id number] NATANIA FINIGAN [id number] [birth year] 12/27/2011      Progress Note-Follow Up  Subjective  Chief Complaint  Chief Complaint  Patient presents with  . Follow-up    1 month    HPI  Patient is a 61 year old Caucasian female who is here today for ongoing evaluation if she transfer marathon. She is training to [employer] marathon on November 3. She feels well. Over this past weekend she ran 11 miles on Saturday and 14 miles on Sunday. He notes she did feel sore on Monday but otherwise  Past Medical History  Diagnosis Date  . Bilateral bunions   . DDD (degenerative disc disease)     spine w narrowing  . Diverticulitis   . Guttate psoriasis   . Hemorrhoids   . Arthritis   . Fuch's endothelial dystrophy   . Acquired bilateral renal cysts   . History of chicken pox   . Hyperlipidemia   . Thrombocytopenia 09/27/2011  . Post-menopausal bleeding 10/08/2011  . Constipation 10/16/2011  . Hematuria 08/24/2011  . Exercise counseling 11/14/2011  . Dehydration, mild 12/26/2011    Past Surgical History  Procedure Date  . Appendectomy   . Tubal ligation   . Cholecystectomy   . Cesarean section   . Lipoma excision     x 2, both arms  . Wrist fracture surgery     right, titanium plate and pins  . Colonoscopy 2005    [place] - diverticulosis and hemorrhoids  . Tonsillectomy     possible adenoids  . Total abdominal hysterectomy     for fibroids    Family History  Problem Relation Age of Onset  . Stroke Mother   . Heart disease Mother   . Kidney disease Father   . Breast cancer Sister 76  . Cancer Sister     ductal carcinoma breast- had it in 1999 and just found out its in the opposite breast  . Colon cancer Maternal Aunt   . Cancer Paternal Uncle     neck tumor  . Cancer Maternal Grandmother     brain tumor  . Breast cancer Paternal Grandmother     post menopausal   . Cancer Paternal  Grandmother     breast  . Stomach cancer Maternal Aunt   . Kidney cancer Cousin   . Pneumonia Maternal Grandfather     History   Social History  . Marital Status: Married    Spouse Name: N/A    Number of Children: N/A  . Years of Education: N/A   Occupational History  . Not on file.   Social History Main Topics  . Smoking status: Never Smoker   . Smokeless tobacco: Never Used  . Alcohol Use: No  . Drug Use: No  . Sexually Active: Yes   Other Topics Concern  . Not on file   Social History Narrative  . No narrative on file    Current Outpatient Prescriptions on File Prior to Visit  Medication Sig Dispense Refill  . Multiple Vitamin (MULTIVITAMIN) tablet Take 1 tablet by mouth daily.      . prednisoLONE acetate (PRED FORTE) 1 % ophthalmic suspension Place 1 drop into both eyes 2 (two) times daily.         Allergies  Allergen Reactions  . Augmentin (Amoxicillin-Pot Clavulanate)     Severe yeast infection-would prefer not to take.    Review of Systems  Review of Systems  Constitutional: Negative for fever and malaise/fatigue.  HENT: Negative for congestion.   Eyes: Negative for discharge.  Respiratory: Negative for shortness of breath.   Cardiovascular: Negative for chest pain, palpitations and leg swelling.  Gastrointestinal: Negative for nausea, abdominal pain and diarrhea.  Genitourinary: Negative for dysuria.  Musculoskeletal: Negative for falls.  Skin: Negative for rash.  Neurological: Negative for loss of consciousness and headaches.  Endo/Heme/Allergies: Negative for polydipsia.  Psychiatric/Behavioral: Negative for depression and suicidal ideas. The patient is not nervous/anxious and does not have insomnia.     Objective  BP 115/79  Pulse 75  Temp 97.2 F (36.2 C) (Temporal)  Ht 5' (1.524 m)  Wt 111 lb 1.9 oz (50.404 kg)  BMI 21.70 kg/m2  SpO2 100%  Physical Exam  Physical Exam  Constitutional: She is oriented to person, place, and time  and well-developed, well-nourished, and in no distress. No distress.  HENT:  Head: Normocephalic and atraumatic.  Eyes: Conjunctivae normal are normal.  Neck: Neck supple. No thyromegaly present.  Cardiovascular: Normal rate, regular rhythm and normal heart sounds.   No murmur heard. Pulmonary/Chest: Effort normal and breath sounds normal. She has no wheezes.  Abdominal: She exhibits no distension and no mass.  Musculoskeletal: She exhibits no edema.  Lymphadenopathy:    She has no cervical adenopathy.  Neurological: She is alert and oriented to person, place, and time.  Skin: Skin is warm and dry. No rash noted. She is not diaphoretic.  Psychiatric: Memory, affect and judgment normal.    Lab Results  Component Value Date   TSH 0.94 09/24/2011   Lab Results  Component Value Date   WBC 5.0 12/26/2011   HGB 12.5 12/26/2011   HCT 37.4 12/26/2011   MCV 91.8 12/26/2011   PLT 145.0* 12/26/2011   Lab Results  Component Value Date   CREATININE 0.7 12/26/2011   BUN 16 12/26/2011   NA 144 12/26/2011   K 4.4 12/26/2011   CL 106 12/26/2011   CO2 31 12/26/2011   Lab Results  Component Value Date   ALT 22 09/24/2011   AST 26 09/24/2011   ALKPHOS 48 09/24/2011   BILITOT 0.7 09/24/2011   Lab Results  Component Value Date   CHOL 177 09/24/2011   Lab Results  Component Value Date   HDL 74.50 09/24/2011   Lab Results  Component Value Date   LDLCALC 91 09/24/2011   Lab Results  Component Value Date   TRIG 57.0 09/24/2011   Lab Results  Component Value Date   CHOLHDL 2 09/24/2011     Assessment & Plan  Dehydration, mild Ran roughly 3-4 miles today and 14 miles over the weekend on Sunday. Will check a renal panel today, encouraged increased hydration  Exercise counseling Will return for repeat EKG and lab work prior to the [facility] marathon  Left lower quadrant pain Doing much better, no recent concerns

## 2011-12-27 NOTE — Assessment & Plan Note (Signed)
Will return for repeat EKG and lab work prior to the [employer]

## 2011-12-27 NOTE — Assessment & Plan Note (Signed)
Doing much better, no recent concerns

## 2011-12-28 NOTE — Progress Notes (Signed)
Quick Note:  Patient Informed and voiced understanding ______ 

## 2012-01-28 ENCOUNTER — Encounter: Payer: Self-pay | Admitting: Family Medicine

## 2012-01-28 ENCOUNTER — Ambulatory Visit: Payer: BC Managed Care – PPO | Admitting: Family Medicine

## 2012-01-28 ENCOUNTER — Ambulatory Visit (INDEPENDENT_AMBULATORY_CARE_PROVIDER_SITE_OTHER): Payer: BC Managed Care – PPO | Admitting: Family Medicine

## 2012-01-28 VITALS — BP 128/78 | HR 73 | Temp 97.8°F | Ht 60.0 in | Wt 113.4 lb

## 2012-01-28 DIAGNOSIS — Z7182 Exercise counseling: Secondary | ICD-10-CM

## 2012-01-28 DIAGNOSIS — E860 Dehydration: Secondary | ICD-10-CM

## 2012-01-28 DIAGNOSIS — K5900 Constipation, unspecified: Secondary | ICD-10-CM

## 2012-01-28 DIAGNOSIS — IMO0001 Reserved for inherently not codable concepts without codable children: Secondary | ICD-10-CM

## 2012-01-28 DIAGNOSIS — D696 Thrombocytopenia, unspecified: Secondary | ICD-10-CM

## 2012-01-28 LAB — RENAL FUNCTION PANEL
BUN: 19 mg/dL (ref 6–23)
Chloride: 104 mEq/L (ref 96–112)
GFR: 107.38 mL/min (ref >60.00)
Phosphorus: 2.6 mg/dL (ref 2.3–4.6)

## 2012-01-28 LAB — CBC
Hemoglobin: 12.7 g/dL (ref 12.0–15.0)
MCHC: 32.7 g/dL (ref 30.0–36.0)
RDW: 14 % (ref 11.5–14.6)

## 2012-01-28 NOTE — Assessment & Plan Note (Signed)
Is training to run the [facility] marathon on February 17, 2012. She is feeling well and had a 22 mile run yesterday. We confirmed EKG has no concerning changes and her cbc and renal are repeated today, report any concerning symptoms

## 2012-01-28 NOTE — Assessment & Plan Note (Signed)
Repeat cbc today 

## 2012-01-28 NOTE — Assessment & Plan Note (Signed)
Improved with better, hydration, fiber and probiotics

## 2012-01-28 NOTE — Progress Notes (Signed)
Patient ID: Joan Lopez, female   DOB: [birth year], 61 y.o.   MRN: [id number] TONI SCHNIPKE [id number] [birth year] 01/28/2012      Progress Note-Follow Up  Subjective  Chief Complaint  Chief Complaint  Patient presents with  . Follow-up    1 month    HPI  Patient is a 61 year old Caucasian female who is in today for evaluation [facility]. She's been training heavily he feels well. She ran 20 miles yesterday without difficulty. She denies recent illness, chest pain, palpitations, shortness of breath, GI or GU concerns. No muscle cramps, headaches or other acute concerns are noted today  Past Medical History  Diagnosis Date  . Bilateral bunions   . DDD (degenerative disc disease)     spine w narrowing  . Diverticulitis   . Guttate psoriasis   . Hemorrhoids   . Arthritis   . Fuch's endothelial dystrophy   . Acquired bilateral renal cysts   . History of chicken pox   . Hyperlipidemia   . Thrombocytopenia 09/27/2011  . Post-menopausal bleeding 10/08/2011  . Constipation 10/16/2011  . Hematuria 08/24/2011  . Exercise counseling 11/14/2011  . Dehydration, mild 12/26/2011    Past Surgical History  Procedure Date  . Appendectomy   . Tubal ligation   . Cholecystectomy   . Cesarean section   . Lipoma excision     x 2, both arms  . Wrist fracture surgery     right, titanium plate and pins  . Colonoscopy 2005    [place] - diverticulosis and hemorrhoids  . Tonsillectomy     possible adenoids  . Total abdominal hysterectomy     for fibroids    Family History  Problem Relation Age of Onset  . Stroke Mother   . Heart disease Mother   . Kidney disease Father   . Breast cancer Sister 49  . Cancer Sister     ductal carcinoma breast- had it in 1999 and just found out its in the opposite breast  . Colon cancer Maternal Aunt   . Cancer Paternal Uncle     neck tumor  . Cancer Maternal Grandmother     brain tumor  . Breast cancer Paternal  Grandmother     post menopausal   . Cancer Paternal Grandmother     breast  . Stomach cancer Maternal Aunt   . Kidney cancer Cousin   . Pneumonia Maternal Grandfather     History   Social History  . Marital Status: Married    Spouse Name: N/A    Number of Children: N/A  . Years of Education: N/A   Occupational History  . Not on file.   Social History Main Topics  . Smoking status: Never Smoker   . Smokeless tobacco: Never Used  . Alcohol Use: No  . Drug Use: No  . Sexually Active: Yes   Other Topics Concern  . Not on file   Social History Narrative  . No narrative on file    Current Outpatient Prescriptions on File Prior to Visit  Medication Sig Dispense Refill  . Multiple Vitamin (MULTIVITAMIN) tablet Take 1 tablet by mouth daily.      . prednisoLONE acetate (PRED FORTE) 1 % ophthalmic suspension Place 1 drop into both eyes 2 (two) times daily.         Allergies  Allergen Reactions  . Augmentin (Amoxicillin-Pot Clavulanate)     Severe yeast infection-would prefer not to take.  Review of Systems  Review of Systems  Constitutional: Negative for fever and malaise/fatigue.  HENT: Negative for congestion.   Eyes: Negative for discharge.  Respiratory: Negative for shortness of breath.   Cardiovascular: Negative for chest pain, palpitations and leg swelling.  Gastrointestinal: Negative for nausea, abdominal pain and diarrhea.  Genitourinary: Negative for dysuria.  Musculoskeletal: Negative for falls.  Skin: Negative for rash.  Neurological: Negative for loss of consciousness and headaches.  Endo/Heme/Allergies: Negative for polydipsia.  Psychiatric/Behavioral: Negative for depression and suicidal ideas. The patient is not nervous/anxious and does not have insomnia.     Objective  BP 128/78  Pulse 73  Temp 97.8 F (36.6 C) (Temporal)  Ht 5' (1.524 m)  Wt 113 lb 6.4 oz (51.438 kg)  BMI 22.15 kg/m2  SpO2 100%  Physical Exam  Physical Exam    Constitutional: She is oriented to person, place, and time and well-developed, well-nourished, and in no distress. No distress.  HENT:  Head: Normocephalic and atraumatic.  Eyes: Conjunctivae normal are normal.  Neck: Neck supple. No thyromegaly present.  Cardiovascular: Normal rate, regular rhythm and normal heart sounds.   No murmur heard. Pulmonary/Chest: Effort normal and breath sounds normal. She has no wheezes.  Abdominal: She exhibits no distension and no mass.  Musculoskeletal: She exhibits no edema.  Lymphadenopathy:    She has no cervical adenopathy.  Neurological: She is alert and oriented to person, place, and time.  Skin: Skin is warm and dry. No rash noted. She is not diaphoretic.  Psychiatric: Memory, affect and judgment normal.    Lab Results  Component Value Date   TSH 0.94 09/24/2011   Lab Results  Component Value Date   WBC 5.0 12/26/2011   HGB 12.5 12/26/2011   HCT 37.4 12/26/2011   MCV 91.8 12/26/2011   PLT 145.0* 12/26/2011   Lab Results  Component Value Date   CREATININE 0.7 12/26/2011   BUN 16 12/26/2011   NA 144 12/26/2011   K 4.4 12/26/2011   CL 106 12/26/2011   CO2 31 12/26/2011   Lab Results  Component Value Date   ALT 22 09/24/2011   AST 26 09/24/2011   ALKPHOS 48 09/24/2011   BILITOT 0.7 09/24/2011   Lab Results  Component Value Date   CHOL 177 09/24/2011   Lab Results  Component Value Date   HDL 74.50 09/24/2011   Lab Results  Component Value Date   LDLCALC 91 09/24/2011   Lab Results  Component Value Date   TRIG 57.0 09/24/2011   Lab Results  Component Value Date   CHOLHDL 2 09/24/2011     Assessment & Plan  Exercise counseling Is training to run the [facility] marathon on February 17, 2012. She is feeling well and had a 22 mile run yesterday. We confirmed EKG has no concerning changes and her cbc and renal are repeated today, report any concerning symptoms  Dehydration, mild Looks better today  Constipation Improved with better,  hydration, fiber and probiotics  Thrombocytopenia Repeat cbc today

## 2012-01-28 NOTE — Assessment & Plan Note (Signed)
Looks better today

## 2012-01-29 NOTE — Progress Notes (Signed)
Quick Note:  Patient Informed and voiced understanding ______ 

## 2012-04-16 HISTORY — PX: LIPOMA EXCISION: SHX5283

## 2012-04-17 ENCOUNTER — Encounter: Payer: Self-pay | Admitting: Family Medicine

## 2012-04-17 ENCOUNTER — Ambulatory Visit (INDEPENDENT_AMBULATORY_CARE_PROVIDER_SITE_OTHER): Payer: BC Managed Care – PPO | Admitting: Family Medicine

## 2012-04-17 VITALS — BP 126/81 | HR 74 | Temp 98.8°F | Ht 60.5 in | Wt 115.4 lb

## 2012-04-17 DIAGNOSIS — R0789 Other chest pain: Secondary | ICD-10-CM

## 2012-04-17 DIAGNOSIS — Z7182 Exercise counseling: Secondary | ICD-10-CM

## 2012-04-17 DIAGNOSIS — IMO0001 Reserved for inherently not codable concepts without codable children: Secondary | ICD-10-CM

## 2012-04-17 DIAGNOSIS — D696 Thrombocytopenia, unspecified: Secondary | ICD-10-CM

## 2012-04-17 DIAGNOSIS — K219 Gastro-esophageal reflux disease without esophagitis: Secondary | ICD-10-CM

## 2012-04-17 HISTORY — DX: Reserved for inherently not codable concepts without codable children: IMO0001

## 2012-04-17 LAB — RENAL FUNCTION PANEL
Albumin: 4 g/dL (ref 3.5–5.2)
Chloride: 105 mEq/L (ref 96–112)
GFR: 99.6 mL/min (ref >60.00)
Phosphorus: 3.1 mg/dL (ref 2.3–4.6)
Potassium: 4 mEq/L (ref 3.5–5.1)

## 2012-04-17 LAB — CBC
MCHC: 34.3 g/dL (ref 30.0–36.0)
Platelets: 139 10*3/uL — ABNORMAL LOW (ref 150.0–400.0)
RDW: 13.5 % (ref 11.5–14.6)

## 2012-04-17 MED ORDER — RANITIDINE HCL 300 MG PO TABS: 300.0000 mg | ORAL_TABLET | Freq: Every day | ORAL | Status: DC

## 2012-04-17 NOTE — Progress Notes (Signed)
Patient ID: Joan Lopez, female   DOB: Jan 21, 1951, 62 y.o.   MRN: [id number] BEXLEIGH THERIAULT [id number] 01/29/1951 04/17/2012      Progress Note-Follow Up  Subjective  Chief Complaint  Chief Complaint  Patient presents with  . Follow-up    6 week follow up    HPI  Patient is a 62 year old Caucasian female who is in today for followup. She is preparing to run a marathon and a half marathon in about a week and half. Overall she feels well. No recent illness, headaches, fevers, chills, palpitations, shortness of breath, GU complaints. She does acknowledge a distant history of reflux and has had some increased throat discomfort and substernal discomfort lately. Does acknowledge she's been eating poorly lately as well. Sour taste in the throat at times. Bowels are moving well. Also complains of some intermittent right shoulder pain but this is mild and infrequent and has been present for many years.  Past Medical History  Diagnosis Date  . Bilateral bunions   . DDD (degenerative disc disease)     spine w narrowing  . Diverticulitis   . Guttate psoriasis   . Hemorrhoids   . Arthritis   . Fuch's endothelial dystrophy   . Acquired bilateral renal cysts   . History of chicken pox   . Hyperlipidemia   . Thrombocytopenia 09/27/2011  . Post-menopausal bleeding 10/08/2011  . Constipation 10/16/2011  . Hematuria 08/24/2011  . Exercise counseling 11/14/2011  . Dehydration, mild 12/26/2011  . Reflux 04/17/2012    Past Surgical History  Procedure Date  . Appendectomy   . Tubal ligation   . Cholecystectomy   . Cesarean section   . Lipoma excision     x 2, both arms  . Wrist fracture surgery     right, titanium plate and pins  . Colonoscopy 2005    [place] - diverticulosis and hemorrhoids  . Tonsillectomy     possible adenoids  . Total abdominal hysterectomy     for fibroids    Family History  Problem Relation Age of Onset  . Stroke Mother   . Heart disease Mother   . Kidney  disease Father   . Breast cancer Sister 48  . Cancer Sister     ductal carcinoma breast- had it in 1999 and just found out its in the opposite breast  . Colon cancer Maternal Aunt   . Cancer Paternal Uncle     neck tumor  . Cancer Maternal Grandmother     brain tumor  . Breast cancer Paternal Grandmother     post menopausal   . Cancer Paternal Grandmother     breast  . Stomach cancer Maternal Aunt   . Kidney cancer Cousin   . Pneumonia Maternal Grandfather     History   Social History  . Marital Status: Married    Spouse Name: N/A    Number of Children: N/A  . Years of Education: N/A   Occupational History  . Not on file.   Social History Main Topics  . Smoking status: Never Smoker   . Smokeless tobacco: Never Used  . Alcohol Use: No  . Drug Use: No  . Sexually Active: Yes   Other Topics Concern  . Not on file   Social History Narrative  . No narrative on file    Current Outpatient Prescriptions on File Prior to Visit  Medication Sig Dispense Refill  . prednisoLONE acetate (PRED FORTE) 1 % ophthalmic suspension Place 1 drop  into both eyes 2 (two) times daily.       . ranitidine (ZANTAC) 300 MG tablet Take 1 tablet (300 mg total) by mouth at bedtime.  30 tablet  2  . Multiple Vitamin (MULTIVITAMIN) tablet Take 1 tablet by mouth daily.        Allergies  Allergen Reactions  . Augmentin (Amoxicillin-Pot Clavulanate)     Severe yeast infection-would prefer not to take.    Review of Systems  Review of Systems  Constitutional: Negative for fever and malaise/fatigue.  HENT: Negative for congestion.   Eyes: Negative for discharge.  Respiratory: Negative for shortness of breath.   Cardiovascular: Negative for chest pain, palpitations and leg swelling.  Gastrointestinal: Positive for heartburn. Negative for nausea, abdominal pain and diarrhea.       C/o a mild sense of irritation of globus in throat and some substernal discomfort.  Genitourinary: Negative for  dysuria.  Musculoskeletal: Positive for joint pain. Negative for falls.       Right shoulder pain, mild only with certain movements, infrequent  Skin: Negative for rash.  Neurological: Negative for loss of consciousness and headaches.  Endo/Heme/Allergies: Negative for polydipsia.  Psychiatric/Behavioral: Negative for depression and suicidal ideas. The patient is not nervous/anxious and does not have insomnia.     Objective  BP 126/81  Pulse 74  Temp 98.8 F (37.1 C) (Temporal)  Ht 5' 0.5" (1.537 m)  Wt 115 lb 6.4 oz (52.345 kg)  BMI 22.17 kg/m2  SpO2 99%  Physical Exam  Physical Exam  Constitutional: She is oriented to person, place, and time and well-developed, well-nourished, and in no distress. No distress.  HENT:  Head: Normocephalic and atraumatic.  Eyes: Conjunctivae normal are normal.  Neck: Neck supple. No thyromegaly present.  Cardiovascular: Normal rate, regular rhythm and normal heart sounds.   No murmur heard. Pulmonary/Chest: Effort normal and breath sounds normal. She has no wheezes.  Abdominal: She exhibits no distension and no mass.  Musculoskeletal: She exhibits no edema.  Lymphadenopathy:    She has no cervical adenopathy.  Neurological: She is alert and oriented to person, place, and time.  Skin: Skin is warm and dry. No rash noted. She is not diaphoretic.  Psychiatric: Memory, affect and judgment normal.    Lab Results  Component Value Date   TSH 0.94 09/24/2011   Lab Results  Component Value Date   WBC 4.1* 04/17/2012   HGB 12.7 04/17/2012   HCT 36.9 04/17/2012   MCV 89.6 04/17/2012   PLT 139.0* 04/17/2012   Lab Results  Component Value Date   CREATININE 0.6 04/17/2012   BUN 17 04/17/2012   NA 141 04/17/2012   K 4.0 04/17/2012   CL 105 04/17/2012   CO2 30 04/17/2012   Lab Results  Component Value Date   ALT 22 09/24/2011   AST 26 09/24/2011   ALKPHOS 48 09/24/2011   BILITOT 0.7 09/24/2011   Lab Results  Component Value Date   CHOL 177 09/24/2011   Lab  Results  Component Value Date   HDL 74.50 09/24/2011   Lab Results  Component Value Date   LDLCALC 91 09/24/2011   Lab Results  Component Value Date   TRIG 57.0 09/24/2011   Lab Results  Component Value Date   CHOLHDL 2 09/24/2011     Assessment & Plan  Exercise counseling Scheduled to run a 1/2 marathon this Saturday and a full marathon down on Sunday at [place], she feels prepared for one but is not  sure if she will do both. Will repeat EKG today and some labs.   Reflux Mild, and eating a lot of bad food lately now having a sense of globus and discomfort in her mid chest will Rantidine 300mg  qhs and adjust diet.   Thrombocytopenia Mild, stable

## 2012-04-17 NOTE — Assessment & Plan Note (Signed)
Mild, stable.  

## 2012-04-17 NOTE — Patient Instructions (Addendum)

## 2012-04-17 NOTE — Assessment & Plan Note (Addendum)
Scheduled to run a 1/2 marathon this Saturday and a full marathon down on Sunday at [place], she feels prepared for one but is not sure if she will do both. Will repeat EKG today and some labs.

## 2012-04-17 NOTE — Assessment & Plan Note (Signed)
Mild, and eating a lot of bad food lately now having a sense of globus and discomfort in her mid chest will Rantidine 300mg  qhs and adjust diet.

## 2012-04-18 NOTE — Progress Notes (Signed)
Quick Note:  Patients husband informed and voiced understanding ______

## 2012-06-09 ENCOUNTER — Other Ambulatory Visit: Payer: Self-pay | Admitting: Family Medicine

## 2012-06-09 DIAGNOSIS — Z1231 Encounter for screening mammogram for malignant neoplasm of breast: Secondary | ICD-10-CM

## 2012-07-09 ENCOUNTER — Encounter: Payer: Self-pay | Admitting: Family Medicine

## 2012-07-09 ENCOUNTER — Ambulatory Visit (INDEPENDENT_AMBULATORY_CARE_PROVIDER_SITE_OTHER): Payer: BC Managed Care – PPO | Admitting: Family Medicine

## 2012-07-09 VITALS — BP 141/83 | HR 88 | Temp 98.9°F | Ht 60.5 in | Wt 117.0 lb

## 2012-07-09 DIAGNOSIS — K219 Gastro-esophageal reflux disease without esophagitis: Secondary | ICD-10-CM

## 2012-07-09 DIAGNOSIS — IMO0001 Reserved for inherently not codable concepts without codable children: Secondary | ICD-10-CM

## 2012-07-09 DIAGNOSIS — D1720 Benign lipomatous neoplasm of skin and subcutaneous tissue of unspecified limb: Secondary | ICD-10-CM

## 2012-07-09 DIAGNOSIS — D1779 Benign lipomatous neoplasm of other sites: Secondary | ICD-10-CM

## 2012-07-09 NOTE — Patient Instructions (Addendum)
Lipoma  A lipoma is a noncancerous (benign) tumor composed of fat cells. They are usually found under the skin (subcutaneous). A lipoma may occur in any tissue of the body that contains fat. Common areas for lipomas to appear include the back, shoulders, buttocks, and thighs. Lipomas are a very common soft tissue growth. They are soft and grow slowly. Most problems caused by a lipoma depend on where it is growing.  DIAGNOSIS   A lipoma can be diagnosed with a physical exam. These tumors rarely become cancerous, but radiographic studies can help determine this for certain. Studies used may include:   Computerized X-ray scans (CT or CAT scan).   Computerized magnetic scans (MRI).  TREATMENT   Small lipomas that are not causing problems may be watched. If a lipoma continues to enlarge or causes problems, removal is often the best treatment. Lipomas can also be removed to improve appearance. Surgery is done to remove the fatty cells and the surrounding capsule. Most often, this is done with medicine that numbs the area (local anesthetic). The removed tissue is examined under a microscope to make sure it is not cancerous. Keep all follow-up appointments with your caregiver.  SEEK MEDICAL CARE IF:    The lipoma becomes larger or hard.   The lipoma becomes painful, red, or increasingly swollen. These could be signs of infection or a more serious condition.  Document Released: 03/23/2002 Document Revised: 06/25/2011 Document Reviewed: 09/02/2009  [employer] Patient Information 2013 [employer].

## 2012-07-13 ENCOUNTER — Encounter: Payer: Self-pay | Admitting: Family Medicine

## 2012-07-13 DIAGNOSIS — R2230 Localized swelling, mass and lump, unspecified upper limb: Secondary | ICD-10-CM | POA: Insufficient documentation

## 2012-07-13 DIAGNOSIS — D1720 Benign lipomatous neoplasm of skin and subcutaneous tissue of unspecified limb: Secondary | ICD-10-CM

## 2012-07-13 HISTORY — DX: Benign lipomatous neoplasm of skin and subcutaneous tissue of unspecified limb: D17.20

## 2012-07-13 HISTORY — DX: Localized swelling, mass and lump, unspecified upper limb: R22.30

## 2012-07-13 NOTE — Assessment & Plan Note (Signed)
Left upper arm. 2 cm firm, round mobile, will refer to surgery for further consideration.

## 2012-07-13 NOTE — Assessment & Plan Note (Signed)
Doing well at this time  

## 2012-07-13 NOTE — Progress Notes (Signed)
Patient ID: Joan Lopez, female   DOB: 04/24/1950, 62 y.o.   MRN: [id number] Joan Lopez [id number] Nov 07, 1950 07/13/2012      Progress Note-Follow Up  Subjective  Chief Complaint  Chief Complaint  Patient presents with  . Lipoma    left arm    HPI  Patient is a 62 year old Caucasian female who is in today complaining of a lesion under her left arm. Is nontender and she has just noted it is firm and mobile. She does note she's been diagnosed lipomas twice in the past. No redness or warmth. No injury or recent malaise or myalgias. Heartburn is generally well-controlled she has had some mild allergic symptoms but nothing she's taking medications for. No chest pain or palpitations. Shortness or breath  Past Medical History  Diagnosis Date  . Bilateral bunions   . DDD (degenerative disc disease)     spine w narrowing  . Diverticulitis   . Guttate psoriasis   . Hemorrhoids   . Arthritis   . Fuch's endothelial dystrophy   . Acquired bilateral renal cysts   . History of chicken pox   . Hyperlipidemia   . Thrombocytopenia 09/27/2011  . Post-menopausal bleeding 10/08/2011  . Constipation 10/16/2011  . Hematuria 08/24/2011  . Exercise counseling 11/14/2011  . Dehydration, mild 12/26/2011  . Reflux 04/17/2012  . Lipoma of arm 07/13/2012    Past Surgical History  Procedure Laterality Date  . Appendectomy    . Tubal ligation    . Cholecystectomy    . Cesarean section    . Lipoma excision      x 2, both arms  . Wrist fracture surgery      right, titanium plate and pins  . Colonoscopy  2005    [place] - diverticulosis and hemorrhoids  . Tonsillectomy      possible adenoids  . Total abdominal hysterectomy      for fibroids    Family History  Problem Relation Age of Onset  . Stroke Mother   . Heart disease Mother   . Kidney disease Father   . Breast cancer Sister 61  . Cancer Sister     ductal carcinoma breast- had it in 1999 and just found out its in the opposite  breast  . Colon cancer Maternal Aunt   . Cancer Paternal Uncle     neck tumor  . Cancer Maternal Grandmother     brain tumor  . Breast cancer Paternal Grandmother     post menopausal   . Cancer Paternal Grandmother     breast  . Stomach cancer Maternal Aunt   . Kidney cancer Cousin   . Pneumonia Maternal Grandfather     History   Social History  . Marital Status: Married    Spouse Name: N/A    Number of Children: N/A  . Years of Education: N/A   Occupational History  . Not on file.   Social History Main Topics  . Smoking status: Never Smoker   . Smokeless tobacco: Never Used  . Alcohol Use: No  . Drug Use: No  . Sexually Active: Yes   Other Topics Concern  . Not on file   Social History Narrative  . No narrative on file    Current Outpatient Prescriptions on File Prior to Visit  Medication Sig Dispense Refill  . prednisoLONE acetate (PRED FORTE) 1 % ophthalmic suspension Place 1 drop into both eyes 2 (two) times daily.  No current facility-administered medications on file prior to visit.    Allergies  Allergen Reactions  . Augmentin (Amoxicillin-Pot Clavulanate)     Severe yeast infection-would prefer not to take.    Review of Systems  Review of Systems  Constitutional: Negative for fever and malaise/fatigue.  HENT: Negative for congestion.   Eyes: Negative for discharge.  Respiratory: Negative for shortness of breath.   Cardiovascular: Negative for chest pain, palpitations and leg swelling.  Gastrointestinal: Negative for nausea, abdominal pain and diarrhea.  Genitourinary: Negative for dysuria.  Musculoskeletal: Negative for falls.  Skin: Negative for rash.  Neurological: Negative for loss of consciousness and headaches.  Endo/Heme/Allergies: Negative for polydipsia.  Psychiatric/Behavioral: Negative for depression and suicidal ideas. The patient is not nervous/anxious and does not have insomnia.     Objective  BP 141/83  Pulse 88   Temp(Src) 98.9 F (37.2 C) (Temporal)  Ht 5' 0.5" (1.537 m)  Wt 117 lb (53.071 kg)  BMI 22.47 kg/m2  SpO2 99%  Physical Exam  Physical Exam  Constitutional: She is oriented to person, place, and time and well-developed, well-nourished, and in no distress. No distress.  HENT:  Head: Normocephalic and atraumatic.  Eyes: Conjunctivae are normal.  Neck: Neck supple. No thyromegaly present.  Cardiovascular: Normal rate, regular rhythm and normal heart sounds.   No murmur heard. Pulmonary/Chest: Effort normal and breath sounds normal. She has no wheezes.  Abdominal: Soft. Bowel sounds are normal. She exhibits no distension and no mass.  Musculoskeletal: She exhibits no edema.  2 cm firm, mobile lesion circular in upper arm  Lymphadenopathy:    She has no cervical adenopathy.  Neurological: She is alert and oriented to person, place, and time.  Skin: Skin is warm and dry. No rash noted. She is not diaphoretic.  Psychiatric: Memory, affect and judgment normal.    Lab Results  Component Value Date   TSH 0.94 09/24/2011   Lab Results  Component Value Date   WBC 4.1* 04/17/2012   HGB 12.7 04/17/2012   HCT 36.9 04/17/2012   MCV 89.6 04/17/2012   PLT 139.0* 04/17/2012   Lab Results  Component Value Date   CREATININE 0.6 04/17/2012   BUN 17 04/17/2012   NA 141 04/17/2012   K 4.0 04/17/2012   CL 105 04/17/2012   CO2 30 04/17/2012   Lab Results  Component Value Date   ALT 22 09/24/2011   AST 26 09/24/2011   ALKPHOS 48 09/24/2011   BILITOT 0.7 09/24/2011   Lab Results  Component Value Date   CHOL 177 09/24/2011   Lab Results  Component Value Date   HDL 74.50 09/24/2011   Lab Results  Component Value Date   LDLCALC 91 09/24/2011   Lab Results  Component Value Date   TRIG 57.0 09/24/2011   Lab Results  Component Value Date   CHOLHDL 2 09/24/2011     Assessment & Plan  Lipoma of arm Left upper arm. 2 cm firm, round mobile, will refer to surgery for further consideration.  Reflux Doing  well at this time.

## 2012-07-18 ENCOUNTER — Ambulatory Visit
Admission: RE | Admit: 2012-07-18 | Discharge: 2012-07-18 | Disposition: A | Discharge status: Home / Self Care | Payer: BC Managed Care – PPO | Source: Ambulatory Visit | Attending: Family Medicine | Admitting: Family Medicine

## 2012-07-18 DIAGNOSIS — Z1231 Encounter for screening mammogram for malignant neoplasm of breast: Secondary | ICD-10-CM

## 2012-08-21 ENCOUNTER — Encounter: Payer: Self-pay | Admitting: Family Medicine

## 2012-08-21 ENCOUNTER — Ambulatory Visit (INDEPENDENT_AMBULATORY_CARE_PROVIDER_SITE_OTHER): Payer: BC Managed Care – PPO | Admitting: Family Medicine

## 2012-08-21 VITALS — BP 122/82 | HR 73 | Temp 98.4°F | Ht 60.5 in | Wt 118.1 lb

## 2012-08-21 DIAGNOSIS — R2232 Localized swelling, mass and lump, left upper limb: Secondary | ICD-10-CM

## 2012-08-21 DIAGNOSIS — Z947 Corneal transplant status: Secondary | ICD-10-CM

## 2012-08-21 DIAGNOSIS — R229 Localized swelling, mass and lump, unspecified: Secondary | ICD-10-CM

## 2012-08-21 HISTORY — DX: Corneal transplant status: Z94.7

## 2012-08-21 MED ORDER — LOTEPREDNOL ETABONATE 0.5 % OP SUSP: 1.0000 [drp] | Freq: Four times a day (QID) | OPHTHALMIC | Status: DC

## 2012-08-21 NOTE — Assessment & Plan Note (Signed)
Is enlarging since last visit will refer to General Surgery for further consideration

## 2012-08-21 NOTE — Progress Notes (Signed)
Patient ID: Joan Lopez, female   DOB: 06/17/1950, 62 y.o.   MRN: [id number] Joan Lopez [id number] [birth year] 08/21/2012      Progress Note-Follow Up  Subjective  Chief Complaint  Chief Complaint  Patient presents with  . Follow-up    6 week    HPI  Patient is a 62 year old Caucasian female who is in today for reevaluation of lesion in her left upper extremity. At her last visit she had just noticed it it was nontender and was not warm or red. She has not had any concerns with in situ left. She denies any pain, numbness or tingling in that arm. No other systemic complaints such as anorexia, fatigue, malaise, chest pain, palpitations shortness or breath GI or GU concerns are noted today  Past Medical History  Diagnosis Date  . Bilateral bunions   . DDD (degenerative disc disease)     spine w narrowing  . Diverticulitis   . Guttate psoriasis   . Hemorrhoids   . Arthritis   . Fuch's endothelial dystrophy   . Acquired bilateral renal cysts   . History of chicken pox   . Hyperlipidemia   . Thrombocytopenia 09/27/2011  . Post-menopausal bleeding 10/08/2011  . Constipation 10/16/2011  . Hematuria 08/24/2011  . Exercise counseling 11/14/2011  . Dehydration, mild 12/26/2011  . Reflux 04/17/2012  . Lipoma of arm 07/13/2012  . 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 Mass of arm 07/13/2012    Has had them in past      Past Surgical History  Procedure Laterality Date  . Appendectomy    . Tubal ligation    . Cholecystectomy    . Cesarean section    . Lipoma excision      x 2, both arms  . Wrist fracture surgery      right, titanium plate and pins  . Colonoscopy  2005    [place] - diverticulosis and hemorrhoids  . Tonsillectomy      possible adenoids  . Total abdominal hysterectomy      for fibroids    Family History  Problem Relation Age of Onset  . Stroke Mother   . Heart disease Mother   . Kidney disease Father   . Breast cancer Sister 10  . Cancer Sister    ductal carcinoma breast- had it in 1999 and just found out its in the opposite breast  . Colon cancer Maternal Aunt   . Cancer Paternal Uncle     neck tumor  . Cancer Maternal Grandmother     brain tumor  . Breast cancer Paternal Grandmother     post menopausal   . Cancer Paternal Grandmother     breast  . Stomach cancer Maternal Aunt   . Kidney cancer Cousin   . Pneumonia Maternal Grandfather     History   Social History  . Marital Status: Married    Spouse Name: N/A    Number of Children: N/A  . Years of Education: N/A   Occupational History  . Not on file.   Social History Main Topics  . Smoking status: Never Smoker   . Smokeless tobacco: Never Used  . Alcohol Use: No  . Drug Use: No  . Sexually Active: Yes   Other Topics Concern  . Not on file   Social History Narrative  . No narrative on file    No current outpatient prescriptions on file prior to visit.   No current facility-administered medications on file prior  to visit.    Allergies  Allergen Reactions  . Augmentin (Amoxicillin-Pot Clavulanate)     Severe yeast infection-would prefer not to take.    Review of Systems  Review of Systems  Constitutional: Negative for fever and malaise/fatigue.  HENT: Negative for congestion.   Eyes: Negative for discharge.  Respiratory: Negative for shortness of breath.   Cardiovascular: Negative for chest pain, palpitations and leg swelling.  Gastrointestinal: Negative for nausea, abdominal pain and diarrhea.  Genitourinary: Negative for dysuria.  Musculoskeletal: Negative for falls.  Skin: Negative for rash.  Neurological: Negative for loss of consciousness and headaches.  Endo/Heme/Allergies: Negative for polydipsia.  Psychiatric/Behavioral: Negative for depression and suicidal ideas. The patient is not nervous/anxious and does not have insomnia.     Objective  BP 122/82  Pulse 73  Temp(Src) 98.4 F (36.9 C) (Oral)  Ht 5' 0.5" (1.537 m)  Wt 118 lb  1.3 oz (53.561 kg)  BMI 22.67 kg/m2  SpO2 95%  Physical Exam  Physical Exam  Constitutional: She is oriented to person, place, and time and well-developed, well-nourished, and in no distress. No distress.  HENT:  Head: Normocephalic and atraumatic.  Eyes: Conjunctivae are normal.  Neck: Neck supple. No thyromegaly present.  Cardiovascular: Normal rate and regular rhythm.  Exam reveals no gallop.   No murmur heard. Pulmonary/Chest: Effort normal and breath sounds normal. She has no wheezes.  Abdominal: She exhibits no distension and no mass.  Musculoskeletal: She exhibits no edema.  1 x 3 cm oblong lesion along bicep in left arm. NT  Lymphadenopathy:    She has no cervical adenopathy.  Neurological: She is alert and oriented to person, place, and time.  Skin: Skin is warm and dry. No rash noted. She is not diaphoretic.  Psychiatric: Memory, affect and judgment normal.    Lab Results  Component Value Date   TSH 0.94 09/24/2011   Lab Results  Component Value Date   WBC 4.1* 04/17/2012   HGB 12.7 04/17/2012   HCT 36.9 04/17/2012   MCV 89.6 04/17/2012   PLT 139.0* 04/17/2012   Lab Results  Component Value Date   CREATININE 0.6 04/17/2012   BUN 17 04/17/2012   NA 141 04/17/2012   K 4.0 04/17/2012   CL 105 04/17/2012   CO2 30 04/17/2012   Lab Results  Component Value Date   ALT 22 09/24/2011   AST 26 09/24/2011   ALKPHOS 48 09/24/2011   BILITOT 0.7 09/24/2011   Lab Results  Component Value Date   CHOL 177 09/24/2011   Lab Results  Component Value Date   HDL 74.50 09/24/2011   Lab Results  Component Value Date   LDLCALC 91 09/24/2011   Lab Results  Component Value Date   TRIG 57.0 09/24/2011   Lab Results  Component Value Date   CHOLHDL 2 09/24/2011     Assessment & Plan  Mass of arm Is enlarging since last visit will refer to General Surgery for further consideration

## 2012-09-02 ENCOUNTER — Ambulatory Visit (INDEPENDENT_AMBULATORY_CARE_PROVIDER_SITE_OTHER): Payer: Self-pay | Admitting: General Surgery

## 2012-09-05 ENCOUNTER — Encounter (INDEPENDENT_AMBULATORY_CARE_PROVIDER_SITE_OTHER): Payer: Self-pay | Admitting: Surgery

## 2012-09-05 ENCOUNTER — Ambulatory Visit (INDEPENDENT_AMBULATORY_CARE_PROVIDER_SITE_OTHER): Payer: BC Managed Care – PPO | Admitting: Surgery

## 2012-09-05 VITALS — BP 128/76 | HR 68 | Temp 98.1°F | Resp 16 | Ht 60.5 in | Wt 116.2 lb

## 2012-09-05 DIAGNOSIS — D1779 Benign lipomatous neoplasm of other sites: Secondary | ICD-10-CM

## 2012-09-05 DIAGNOSIS — D1720 Benign lipomatous neoplasm of skin and subcutaneous tissue of unspecified limb: Secondary | ICD-10-CM

## 2012-09-05 NOTE — Progress Notes (Signed)
Patient ID: Joan Lopez, female   DOB: 1950-10-29, 62 y.o.   MRN: [id number]  Chief Complaint  Patient presents with  . New Evaluation    eval lt arm mass    HPI Joan Lopez is a 62 y.o. female.  Patient sent at request of Dr. Rogelia Rohrer  for left arm lipoma.  She has a history of other lipomas being excised most recently in 2007. She is not sure how long this lump has been in her left upper arm the least the last 3 months he has been followed. It is getting larger. It is causing no pain. She denies any numbness in the extremity. It is overlying her left triceps muscle. HPI  Past Medical History  Diagnosis Date  . Bilateral bunions   . DDD (degenerative disc disease)     spine w narrowing  . Diverticulitis   . Guttate psoriasis   . Hemorrhoids   . Arthritis   . Fuch's endothelial dystrophy   . Acquired bilateral renal cysts   . History of chicken pox   . Hyperlipidemia   . Thrombocytopenia 09/27/2011  . Post-menopausal bleeding 10/08/2011  . Constipation 10/16/2011  . Hematuria 08/24/2011  . Exercise counseling 11/14/2011  . Dehydration, mild 12/26/2011  . Reflux 04/17/2012  . Lipoma of arm 07/13/2012  . 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 Mass of arm 07/13/2012    Has had them in past      Past Surgical History  Procedure Laterality Date  . Appendectomy    . Tubal ligation    . Cholecystectomy    . Cesarean section    . Lipoma excision      x 2, both arms  . Wrist fracture surgery      right, titanium plate and pins  . Colonoscopy  2005    [place] - diverticulosis and hemorrhoids  . Tonsillectomy      possible adenoids  . Total abdominal hysterectomy      for fibroids  . Corneal transplant      Family History  Problem Relation Age of Onset  . Stroke Mother   . Heart disease Mother   . Kidney disease Father   . Breast cancer Sister 68  . Cancer Sister     ductal carcinoma breast- had it in 1999 and just found out its in the opposite breast  . Colon cancer  Maternal Aunt   . Cancer Maternal Aunt     colon  . Cancer Paternal Uncle     neck tumor  . Cancer Maternal Grandmother     brain tumor  . Breast cancer Paternal Grandmother     post menopausal   . Cancer Paternal Grandmother     breast  . Stomach cancer Maternal Aunt   . Cancer Maternal Aunt     stomach  . Kidney cancer Cousin   . Pneumonia Maternal Grandfather     Social History History  Substance Use Topics  . Smoking status: Never Smoker   . Smokeless tobacco: Never Used  . Alcohol Use: No    Allergies  Allergen Reactions  . Augmentin (Amoxicillin-Pot Clavulanate)     Severe yeast infection-would prefer not to take.    Current Outpatient Prescriptions  Medication Sig Dispense Refill  . loteprednol (LOTEMAX) 0.5 % ophthalmic suspension Place 1 drop into both eyes 4 (four) times daily.  5 mL  0   No current facility-administered medications for this visit.    Review of Systems Review  of Systems  Constitutional: Negative for fever, chills and unexpected weight change.  HENT: Negative for hearing loss, congestion, sore throat, trouble swallowing and voice change.   Eyes: Negative for visual disturbance.  Respiratory: Negative for cough and wheezing.   Cardiovascular: Negative for chest pain, palpitations and leg swelling.  Gastrointestinal: Negative for nausea, vomiting, abdominal pain, diarrhea, constipation, blood in stool, abdominal distention and anal bleeding.  Genitourinary: Negative for hematuria, vaginal bleeding and difficulty urinating.  Musculoskeletal: Negative for arthralgias.  Skin: Negative for rash and wound.  Neurological: Negative for seizures, syncope and headaches.  Hematological: Negative for adenopathy. Does not bruise/bleed easily.  Psychiatric/Behavioral: Negative for confusion.    Blood pressure 128/76, pulse 68, temperature 98.1 F (36.7 C), temperature source Oral, resp. rate 16, height 5' 0.5" (1.537 m), weight 116 lb 3.2 oz (52.708  kg).  Physical Exam Physical Exam  Constitutional: She is oriented to person, place, and time. She appears well-developed.  HENT:  Head: Normocephalic and atraumatic.  Eyes: EOM are normal. Pupils are equal, round, and reactive to light.  Neck: Normal range of motion.  Pulmonary/Chest: Effort normal.  Musculoskeletal:       Arms: Neurological: She is alert and oriented to person, place, and time.  Skin: Skin is warm and dry.  Psychiatric: She has a normal mood and affect. Her behavior is normal. Judgment and thought content normal.    Data Reviewed Dr Rogelia Rohrer notes  Assessment    Left arm lipoma 3 cm overlying triceps muscle    Plan    Patient wishes excision of left arm lipoma.The procedure has been discussed with the patient.  Alternative therapies have been discussed with the patient.  Operative risks include bleeding,  Infection,  Organ injury,  Nerve injury,  Blood vessel injury,  DVT,  Pulmonary embolism,  Death,  And possible reoperation.  Medical management risks include worsening of present situation.  The success of the procedure is 50 -90 % at treating patients symptoms.  The patient understands and agrees to proceed.       Joan Lopez A. 09/05/2012, 10:16 AM

## 2012-09-05 NOTE — Patient Instructions (Addendum)

## 2012-10-01 ENCOUNTER — Other Ambulatory Visit (INDEPENDENT_AMBULATORY_CARE_PROVIDER_SITE_OTHER): Payer: Self-pay | Admitting: Surgery

## 2012-10-01 DIAGNOSIS — D1739 Benign lipomatous neoplasm of skin and subcutaneous tissue of other sites: Secondary | ICD-10-CM

## 2012-10-03 ENCOUNTER — Telehealth (INDEPENDENT_AMBULATORY_CARE_PROVIDER_SITE_OTHER): Payer: Self-pay

## 2012-10-03 NOTE — Telephone Encounter (Signed)
Pt notified of benign pathology results and was much relieved.

## 2012-10-03 NOTE — Telephone Encounter (Signed)
Patient called to report dark colored urine  After taking her Vicodin. Denies temp,urgency,frequency,burning . Advised her this can happen after being put to sleep. She advised to increase her fluid in take.She is to call if her symptoms do not improve or has any other concerns.

## 2012-10-07 ENCOUNTER — Encounter: Payer: Self-pay | Admitting: Family Medicine

## 2012-10-07 ENCOUNTER — Ambulatory Visit (INDEPENDENT_AMBULATORY_CARE_PROVIDER_SITE_OTHER): Payer: BC Managed Care – PPO | Admitting: Family Medicine

## 2012-10-07 VITALS — BP 122/80 | HR 68 | Temp 98.1°F | Ht 60.5 in | Wt 116.0 lb

## 2012-10-07 DIAGNOSIS — E860 Dehydration: Secondary | ICD-10-CM

## 2012-10-07 DIAGNOSIS — D696 Thrombocytopenia, unspecified: Secondary | ICD-10-CM

## 2012-10-07 DIAGNOSIS — D1779 Benign lipomatous neoplasm of other sites: Secondary | ICD-10-CM

## 2012-10-07 DIAGNOSIS — D1720 Benign lipomatous neoplasm of skin and subcutaneous tissue of unspecified limb: Secondary | ICD-10-CM

## 2012-10-07 DIAGNOSIS — R319 Hematuria, unspecified: Secondary | ICD-10-CM

## 2012-10-07 LAB — HEPATIC FUNCTION PANEL
ALT: 29 U/L (ref 0–35)
AST: 28 U/L (ref 0–37)
Albumin: 4.5 g/dL (ref 3.5–5.2)
Alkaline Phosphatase: 64 U/L (ref 39–117)
Bilirubin, Direct: 0.2 mg/dL (ref 0.0–0.3)
Indirect Bilirubin: 0.6 mg/dL (ref 0.0–0.9)
Total Bilirubin: 0.8 mg/dL (ref 0.3–1.2)
Total Protein: 7 g/dL (ref 6.0–8.3)

## 2012-10-07 LAB — RENAL FUNCTION PANEL
Albumin: 4.5 g/dL (ref 3.5–5.2)
BUN: 17 mg/dL (ref 6–23)
CO2: 32 meq/L (ref 19–32)
Calcium: 9.5 mg/dL (ref 8.4–10.5)
Chloride: 103 meq/L (ref 96–112)
Creat: 0.67 mg/dL (ref 0.50–1.10)
Glucose, Bld: 84 mg/dL (ref 70–99)
Phosphorus: 3.7 mg/dL (ref 2.3–4.6)
Potassium: 4.2 meq/L (ref 3.5–5.3)
Sodium: 139 meq/L (ref 135–145)

## 2012-10-07 LAB — CBC
HCT: 39.1 % (ref 36.0–46.0)
Hemoglobin: 13.4 g/dL (ref 12.0–15.0)
RDW: 13.9 % (ref 11.5–15.5)
WBC: 5 10*3/uL (ref 4.0–10.5)

## 2012-10-07 LAB — CK: Total CK: 70 U/L (ref 7–177)

## 2012-10-07 NOTE — Patient Instructions (Addendum)
Hematuria, Adult Hematuria (blood in your urine) can be caused by a bladder infection (cystitis), kidney infection (pyelonephritis), prostate infection (prostatitis), or kidney stone. Infections will usually respond to antibiotics (medications which kill germs), and a kidney stone will usually pass through your urine without further treatment. If you were put on antibiotics, take all the medicine until gone. You may feel better in a few days, but take all of your medicine or the infection may not respond and become more difficult to treat. If antibiotics were not given, an infection did not cause the blood in the urine. A further work up to find out the reason may be needed. HOME CARE INSTRUCTIONS   Drink lots of fluid, 3 to 4 quarts a day. If you have been diagnosed with an infection, cranberry juice is especially recommended, in addition to large amounts of water.  Avoid caffeine, tea, and carbonated beverages, because they tend to irritate the bladder.  Avoid alcohol as it may irritate the prostate.  Only take over-the-counter or prescription medicines for pain, discomfort, or fever as directed by your caregiver.  If you have been diagnosed with a kidney stone follow your caregivers instructions regarding straining your urine to catch the stone. TO PREVENT FURTHER INFECTIONS:  Empty the bladder often. Avoid holding urine for long periods of time.  After a bowel movement, women should cleanse front to back. Use each tissue only once.  Empty the bladder before and after sexual intercourse if you are a female.  Return to your caregiver if you develop back pain, fever, nausea (feeling sick to your stomach), vomiting, or your symptoms (problems) are not better in 3 days. Return sooner if you are getting worse. If you have been requested to return for further testing make sure to keep your appointments. If an infection is not the cause of blood in your urine, X-rays may be required. Your caregiver  will discuss this with you. SEEK IMMEDIATE MEDICAL CARE IF:   You have a persistent fever over 102 F (38.9 C).  You develop severe vomiting and are unable to keep the medication down.  You develop severe back or abdominal pain despite taking your medications.  You begin passing a large amount of blood or clots in your urine.  You feel extremely weak or faint, or pass out. MAKE SURE YOU:   Understand these instructions.  Will watch your condition.  Will get help right away if you are not doing well or get worse. Document Released: 04/02/2005 Document Revised: 06/25/2011 Document Reviewed: 11/20/2007 [employer] Patient Information 2014 [employer]  

## 2012-10-07 NOTE — Assessment & Plan Note (Signed)
Dark urine started 4-5 days ago. Dark brown vs bloody, improving with excessive hydration stopped completely stopped 2 days ago. Never any urinary symptoms

## 2012-10-07 NOTE — Assessment & Plan Note (Signed)
Removed surgically 6 days ago.

## 2012-10-08 LAB — URINALYSIS
Leukocytes, UA: NEGATIVE
Nitrite: NEGATIVE
Specific Gravity, Urine: 1.02 (ref 1.005–1.030)
Urobilinogen, UA: 0.2 mg/dL (ref 0.0–1.0)

## 2012-10-09 LAB — URINE CULTURE
Colony Count: NO GROWTH
Organism ID, Bacteria: NO GROWTH

## 2012-10-09 NOTE — Progress Notes (Signed)
Quick Note:  Patient Informed and voiced understanding ______ 

## 2012-10-11 NOTE — Progress Notes (Signed)
Patient ID: [name], female   DOB: [birth year], 62 y.o.   MRN: [id number] Joan Lopez [id number] 1951/03/11 10/11/2012      Progress Note-Follow Up  Subjective  Chief Complaint  Chief Complaint  Patient presents with  . Hematuria    X several days    HPI  Patient is a 62 year old Caucasian female who is in today complaining of several days worth of dark urine. She had a lipoma removed from her left upper arm last week. To just 2 doses of Vicodin and is concerned that this caused what she thought was hematuria. She notes having very dark almost brown urine with them later having episodes of pink urine. Never had any urinary frequency, urgency, fevers, chills, dysuria, flank pain, abdominal pain. Has had similar episodes in the past and workup has revealed renal cysts. No other complaints no chest pain, palpitations, GI complaints  Past Medical History  Diagnosis Date  . Bilateral bunions   . DDD (degenerative disc disease)     spine w narrowing  . Diverticulitis   . Guttate psoriasis   . Hemorrhoids   . Arthritis   . Fuch's endothelial dystrophy   . Acquired bilateral renal cysts   . History of chicken pox   . Hyperlipidemia   . Thrombocytopenia 09/27/2011  . Post-menopausal bleeding 10/08/2011  . Constipation 10/16/2011  . Hematuria 08/24/2011  . Exercise counseling 11/14/2011  . Dehydration, mild 12/26/2011  . Reflux 04/17/2012  . Lipoma of arm 07/13/2012  . 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 Mass of arm 07/13/2012    Has had them in past    . Lipoma of arm 10/07/2012    Left     Past Surgical History  Procedure Laterality Date  . Appendectomy    . Tubal ligation    . Cholecystectomy    . Cesarean section    . Lipoma excision      x 2, both arms  . Wrist fracture surgery      right, titanium plate and pins  . Colonoscopy  2005    [place] - diverticulosis and hemorrhoids  . Tonsillectomy      possible adenoids  . Total abdominal hysterectomy      for  fibroids  . Corneal transplant      Family History  Problem Relation Age of Onset  . Stroke Mother   . Heart disease Mother   . Kidney disease Father   . Breast cancer Sister 26  . Cancer Sister     ductal carcinoma breast- had it in 1999 and just found out its in the opposite breast  . Colon cancer Maternal Aunt   . Cancer Maternal Aunt     colon  . Cancer Paternal Uncle     neck tumor  . Cancer Maternal Grandmother     brain tumor  . Breast cancer Paternal Grandmother     post menopausal   . Cancer Paternal Grandmother     breast  . Stomach cancer Maternal Aunt   . Cancer Maternal Aunt     stomach  . Kidney cancer Cousin   . Pneumonia Maternal Grandfather     History   Social History  . Marital Status: Married    Spouse Name: N/A    Number of Children: N/A  . Years of Education: N/A   Occupational History  . Not on file.   Social History Main Topics  . Smoking status: Never Smoker   . Smokeless tobacco:  Never Used  . Alcohol Use: No  . Drug Use: No  . Sexually Active: Yes   Other Topics Concern  . Not on file   Social History Narrative  . No narrative on file    No current outpatient prescriptions on file prior to visit.   No current facility-administered medications on file prior to visit.    Allergies  Allergen Reactions  . Augmentin (Amoxicillin-Pot Clavulanate)     Severe yeast infection-would prefer not to take.    Review of Systems  Review of Systems  Constitutional: Negative for fever and malaise/fatigue.  HENT: Negative for congestion.   Eyes: Negative for discharge.  Respiratory: Negative for shortness of breath.   Cardiovascular: Negative for chest pain, palpitations and leg swelling.  Gastrointestinal: Negative for nausea, abdominal pain and diarrhea.  Genitourinary: Negative for dysuria.  Musculoskeletal: Negative for falls.  Skin: Negative for rash.  Neurological: Negative for loss of consciousness and headaches.   Endo/Heme/Allergies: Negative for polydipsia.  Psychiatric/Behavioral: Negative for depression and suicidal ideas. The patient is not nervous/anxious and does not have insomnia.     Objective  BP 122/80  Pulse 68  Temp(Src) 98.1 F (36.7 C) (Oral)  Ht 5' 0.5" (1.537 m)  Wt 116 lb 0.6 oz (52.635 kg)  BMI 22.28 kg/m2  SpO2 97%  Physical Exam  Physical Exam  Constitutional: She is oriented to person, place, and time and well-developed, well-nourished, and in no distress. No distress.  HENT:  Head: Normocephalic and atraumatic.  Eyes: Conjunctivae are normal.  Neck: Neck supple. No thyromegaly present.  Cardiovascular: Normal rate and regular rhythm.  Exam reveals no gallop.   No murmur heard. Pulmonary/Chest: Effort normal and breath sounds normal. She has no wheezes.  Abdominal: She exhibits no distension and no mass.  Musculoskeletal: She exhibits no edema.  Lymphadenopathy:    She has no cervical adenopathy.  Neurological: She is alert and oriented to person, place, and time.  Skin: Skin is warm and dry. No rash noted. She is not diaphoretic.  Psychiatric: Memory, affect and judgment normal.    Lab Results  Component Value Date   TSH 0.94 09/24/2011   Lab Results  Component Value Date   WBC 5.0 10/07/2012   HGB 13.4 10/07/2012   HCT 39.1 10/07/2012   MCV 84.4 10/07/2012   PLT 164 10/07/2012   Lab Results  Component Value Date   CREATININE 0.67 10/07/2012   BUN 17 10/07/2012   NA 139 10/07/2012   K 4.2 10/07/2012   CL 103 10/07/2012   CO2 32 10/07/2012   Lab Results  Component Value Date   ALT 29 10/07/2012   AST 28 10/07/2012   ALKPHOS 64 10/07/2012   BILITOT 0.8 10/07/2012   Lab Results  Component Value Date   CHOL 177 09/24/2011   Lab Results  Component Value Date   HDL 74.50 09/24/2011   Lab Results  Component Value Date   LDLCALC 91 09/24/2011   Lab Results  Component Value Date   TRIG 57.0 09/24/2011   Lab Results  Component Value Date   CHOLHDL 2  09/24/2011     Assessment & Plan  Lipoma of arm Removed surgically 6 days ago.   Hematuria Dark urine started 4-5 days ago. Dark brown vs bloody, improving with excessive hydration stopped completely stopped 2 days ago. Never any urinary symptoms  HEMATURIA UNSPECIFIED Patient reports seeing dark, sometimes pink urine not confirmed on urinalysis. May need to return to urology

## 2012-10-11 NOTE — Assessment & Plan Note (Signed)
Patient reports seeing dark, sometimes pink urine not confirmed on urinalysis. May need to return to urology

## 2012-10-20 ENCOUNTER — Other Ambulatory Visit (INDEPENDENT_AMBULATORY_CARE_PROVIDER_SITE_OTHER): Payer: BC Managed Care – PPO

## 2012-10-20 ENCOUNTER — Ambulatory Visit (INDEPENDENT_AMBULATORY_CARE_PROVIDER_SITE_OTHER): Payer: BC Managed Care – PPO | Admitting: Surgery

## 2012-10-20 ENCOUNTER — Encounter (INDEPENDENT_AMBULATORY_CARE_PROVIDER_SITE_OTHER): Payer: Self-pay

## 2012-10-20 ENCOUNTER — Encounter (INDEPENDENT_AMBULATORY_CARE_PROVIDER_SITE_OTHER): Payer: Self-pay | Admitting: Surgery

## 2012-10-20 VITALS — BP 118/76 | HR 72 | Temp 97.6°F | Resp 16 | Ht 60.5 in | Wt 116.0 lb

## 2012-10-20 DIAGNOSIS — Z9889 Other specified postprocedural states: Secondary | ICD-10-CM

## 2012-10-20 DIAGNOSIS — Z7182 Exercise counseling: Secondary | ICD-10-CM

## 2012-10-20 LAB — CBC
HCT: 38.3 % (ref 36.0–46.0)
MCHC: 33.6 g/dL (ref 30.0–36.0)
MCV: 91.6 fl (ref 78.0–100.0)
RBC: 4.18 Mil/uL (ref 3.87–5.11)
RDW: 13.6 % (ref 11.5–14.6)

## 2012-10-20 NOTE — Patient Instructions (Signed)
Resume full activity. Return as needed.  

## 2012-10-20 NOTE — Progress Notes (Signed)
Patient returns after excision of left upper arm lipoma. She is doing well. She has had some blood in her urine.  Exam: Left upper arm incision clean dry and intact without signs of infection.  Impression: Status post excision left upper arm lipoma  Plan: Resume full activity. Followup as needed.

## 2012-10-20 NOTE — Progress Notes (Signed)
Labs only

## 2012-10-21 LAB — HEPATIC FUNCTION PANEL
ALT: 31 U/L (ref 0–35)
Albumin: 4.1 g/dL (ref 3.5–5.2)
Alkaline Phosphatase: 55 U/L (ref 39–117)
Total Protein: 7 g/dL (ref 6.0–8.3)

## 2012-10-21 LAB — LIPID PANEL
Cholesterol: 175 mg/dL (ref 0–200)
LDL Cholesterol: 72 mg/dL (ref 0–99)
Triglycerides: 48 mg/dL (ref 0.0–149.0)

## 2012-10-21 LAB — RENAL FUNCTION PANEL
Albumin: 4.1 g/dL (ref 3.5–5.2)
BUN: 26 mg/dL — ABNORMAL HIGH (ref 6–23)
Chloride: 108 mEq/L (ref 96–112)
Creatinine, Ser: 0.6 mg/dL (ref 0.4–1.2)
GFR: 113.66 mL/min (ref >60.00)
Glucose, Bld: 99 mg/dL (ref 70–99)
Phosphorus: 3.4 mg/dL (ref 2.3–4.6)

## 2012-10-24 ENCOUNTER — Ambulatory Visit: Payer: BC Managed Care – PPO | Admitting: Family Medicine

## 2012-10-27 ENCOUNTER — Ambulatory Visit (INDEPENDENT_AMBULATORY_CARE_PROVIDER_SITE_OTHER): Payer: BC Managed Care – PPO | Admitting: Family Medicine

## 2012-10-27 ENCOUNTER — Encounter: Payer: Self-pay | Admitting: Family Medicine

## 2012-10-27 VITALS — BP 110/80 | HR 73 | Temp 98.4°F | Ht 60.5 in | Wt 115.1 lb

## 2012-10-27 DIAGNOSIS — R7989 Other specified abnormal findings of blood chemistry: Secondary | ICD-10-CM

## 2012-10-27 DIAGNOSIS — E860 Dehydration: Secondary | ICD-10-CM

## 2012-10-27 DIAGNOSIS — R945 Abnormal results of liver function studies: Secondary | ICD-10-CM

## 2012-10-27 NOTE — Patient Instructions (Addendum)
Labs in roughly 8 weeks hepatic, acute hepatitis panel, renal for abnormal lft's Probiotic daily Digestive Advantage  Fatty Liver Fatty liver is the accumulation of fat in liver cells. It is also called hepatosteatosis or steatohepatitis. It is normal for your liver to contain some fat. If fat is more than 5 to 10% of your liver's weight, you have fatty liver.  There are often no symptoms (problems) for years while damage is still occurring. People often learn about their fatty liver when they have medical tests for other reasons. Fat can damage your liver for years or even decades without causing problems. When it becomes severe, it can cause fatigue, weight loss, weakness, and confusion. This makes you more likely to develop more serious liver problems. The liver is the largest organ in the body. It does a lot of work and often gives no warning signs when it is sick until late in a disease. The liver has many important jobs including:  Breaking down foods.  Storing vitamins, iron, and other minerals.  Making proteins.  Making bile for food digestion.  Breaking down many products including medications, alcohol and some poisons. CAUSES  There are a number of different conditions, medications, and poisons that can cause a fatty liver. Eating too many calories causes fat to build up in the liver. Not processing and breaking fats down normally may also cause this. Certain conditions, such as obesity, diabetes, and high triglycerides also cause this. Most fatty liver patients tend to be middle-aged and over weight.  Some causes of fatty liver are:  Alcohol over consumption.  Malnutrition.  Steroid use.  Valproic acid toxicity.  Obesity.  Cushing's syndrome.  Poisons.  Tetracycline in high dosages.  Pregnancy.  Diabetes.  Hyperlipidemia.  Rapid weight loss. Some people develop fatty liver even having none of these conditions. SYMPTOMS  Fatty liver most often causes no  problems. This is called asymptomatic.  It can be diagnosed with blood tests and also by a liver biopsy.  It is one of the most common causes of minor elevations of liver enzymes on routine blood tests.  Specialized Imaging of the liver using ultrasound, CT (computed tomography) scan, or MRI (magnetic resonance imaging) can suggest a fatty liver but a biopsy is needed to confirm it.  A biopsy involves taking a small sample of liver tissue. This is done by using a needle. It is then looked at under a microscope by a specialist. TREATMENT  It is important to treat the cause. Simple fatty liver without a medical reason may not need treatment.  Weight loss, fat restriction, and exercise in overweight patients produces inconsistent results but is worth trying.  Fatty liver due to alcohol toxicity may not improve even with stopping drinking.  Good control of diabetes may reduce fatty liver.  Lower your triglycerides through diet, medication or both.  Eat a balanced, healthy diet.  Increase your physical activity.  Get regular checkups from a liver specialist.  There are no medical or surgical treatments for a fatty liver or NASH, but improving your diet and increasing your exercise may help prevent or reverse some of the damage. PROGNOSIS  Fatty liver may cause no damage or it can lead to an inflammation of the liver. This is, called steatohepatitis. When it is linked to alcohol abuse, it is called alcoholic steatohepatitis. It often is not linked to alcohol. It is then called nonalcoholic steatohepatitis, or NASH. Over time the liver may become scarred and hardened. This condition is  called cirrhosis. Cirrhosis is serious and may lead to liver failure or cancer. NASH is one of the leading causes of cirrhosis. About 10-20% of Americans have fatty liver and a smaller 2-5% has NASH. Document Released: 05/18/2005 Document Revised: 06/25/2011 Document Reviewed: 07/11/2005 [facility] Patient  Information 2014 [place], [place].

## 2012-10-29 ENCOUNTER — Encounter: Payer: Self-pay | Admitting: Family Medicine

## 2012-10-29 DIAGNOSIS — R7989 Other specified abnormal findings of blood chemistry: Secondary | ICD-10-CM | POA: Insufficient documentation

## 2012-10-29 DIAGNOSIS — R945 Abnormal results of liver function studies: Secondary | ICD-10-CM

## 2012-10-29 HISTORY — DX: Other specified abnormal findings of blood chemistry: R79.89

## 2012-10-29 HISTORY — DX: Abnormal results of liver function studies: R94.5

## 2012-10-29 NOTE — Progress Notes (Signed)
Patient ID: Joan Lopez, female   DOB: 09-Oct-1950, 62 y.o.   MRN: [id number] Joan Lopez [id number] [birth year] 10/29/2012      Progress Note-Follow Up  Subjective  Chief Complaint  Chief Complaint  Patient presents with  . Follow-up    HPI  Patient is a 62 year old Caucasian female who is in today for routine followup. Her excision site from her lipoma has healed well. She's had no recent illness. She denies fevers, chills, chest pain, palpitations, shortness of breath, GI or GU concerns. She is back in training for the next [place] Marathon.  Past Medical History  Diagnosis Date  . Bilateral bunions   . DDD (degenerative disc disease)     spine w narrowing  . Diverticulitis   . Guttate psoriasis   . Hemorrhoids   . Arthritis   . Fuch's endothelial dystrophy   . Acquired bilateral renal cysts   . History of chicken pox   . Hyperlipidemia   . Thrombocytopenia 09/27/2011  . Post-menopausal bleeding 10/08/2011  . Constipation 10/16/2011  . Hematuria 08/24/2011  . Exercise counseling 11/14/2011  . Dehydration, mild 12/26/2011  . Reflux 04/17/2012  . Lipoma of arm 07/13/2012  . 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 Mass of arm 07/13/2012    Has had them in past    . Lipoma of arm 10/07/2012    Left   . Abnormal LFTs 10/29/2012    Past Surgical History  Procedure Laterality Date  . Appendectomy    . Tubal ligation    . Cholecystectomy    . Cesarean section    . Lipoma excision      x 2, both arms  . Wrist fracture surgery      right, titanium plate and pins  . Colonoscopy  2005    [place] - diverticulosis and hemorrhoids  . Tonsillectomy      possible adenoids  . Total abdominal hysterectomy      for fibroids  . Corneal transplant      Family History  Problem Relation Age of Onset  . Stroke Mother   . Heart disease Mother   . Kidney disease Father   . Breast cancer Sister 62  . Cancer Sister     ductal carcinoma breast- had it in 1999 and just found  out its in the opposite breast  . Colon cancer Maternal Aunt   . Cancer Maternal Aunt     colon  . Cancer Paternal Uncle     neck tumor  . Cancer Maternal Grandmother     brain tumor  . Breast cancer Paternal Grandmother     post menopausal   . Cancer Paternal Grandmother     breast  . Stomach cancer Maternal Aunt   . Cancer Maternal Aunt     stomach  . Kidney cancer Cousin   . Pneumonia Maternal Grandfather     History   Social History  . Marital Status: Married    Spouse Name: N/A    Number of Children: N/A  . Years of Education: N/A   Occupational History  . Not on file.   Social History Main Topics  . Smoking status: Never Smoker   . Smokeless tobacco: Never Used  . Alcohol Use: No  . Drug Use: No  . Sexually Active: Yes   Other Topics Concern  . Not on file   Social History Narrative  . No narrative on file    Current Outpatient Prescriptions on File  Prior to Visit  Medication Sig Dispense Refill  . loteprednol (LOTEMAX) 0.5 % ophthalmic suspension Place 1 drop into both eyes as directed. Monday, Wed, and Friday       No current facility-administered medications on file prior to visit.    Allergies  Allergen Reactions  . Augmentin (Amoxicillin-Pot Clavulanate)     Severe yeast infection-would prefer not to take.    Review of Systems  Review of Systems  Constitutional: Negative for fever and malaise/fatigue.  HENT: Negative for congestion.   Eyes: Negative for pain and discharge.  Respiratory: Negative for shortness of breath.   Cardiovascular: Negative for chest pain, palpitations and leg swelling.  Gastrointestinal: Negative for nausea, abdominal pain and diarrhea.  Genitourinary: Negative for dysuria.  Musculoskeletal: Negative for falls.  Skin: Negative for rash.  Neurological: Negative for loss of consciousness and headaches.  Endo/Heme/Allergies: Negative for polydipsia.  Psychiatric/Behavioral: Negative for depression and suicidal  ideas. The patient is not nervous/anxious and does not have insomnia.     Objective  BP 110/80  Pulse 73  Temp(Src) 98.4 F (36.9 C) (Oral)  Ht 5' 0.5" (1.537 m)  Wt 115 lb 1.9 oz (52.218 kg)  BMI 22.1 kg/m2  SpO2 98%  Physical Exam  Physical Exam  Constitutional: She is oriented to person, place, and time and well-developed, well-nourished, and in no distress. No distress.  HENT:  Head: Normocephalic and atraumatic.  Eyes: Conjunctivae are normal.  Neck: Neck supple. No thyromegaly present.  Cardiovascular: Normal rate, regular rhythm and normal heart sounds.   No murmur heard. Pulmonary/Chest: Effort normal and breath sounds normal. She has no wheezes.  Abdominal: She exhibits no distension and no mass.  Musculoskeletal: She exhibits no edema.  Lymphadenopathy:    She has no cervical adenopathy.  Neurological: She is alert and oriented to person, place, and time.  Skin: Skin is warm and dry. No rash noted. She is not diaphoretic.  Psychiatric: Memory, affect and judgment normal.    Lab Results  Component Value Date   TSH 1.33 10/20/2012   Lab Results  Component Value Date   WBC 4.7 10/20/2012   HGB 12.8 10/20/2012   HCT 38.3 10/20/2012   MCV 91.6 10/20/2012   PLT 161.0 10/20/2012   Lab Results  Component Value Date   CREATININE 0.6 10/20/2012   BUN 26* 10/20/2012   NA 142 10/20/2012   K 3.8 10/20/2012   CL 108 10/20/2012   CO2 28 10/20/2012   Lab Results  Component Value Date   ALT 31 10/20/2012   AST 38* 10/20/2012   ALKPHOS 55 10/20/2012   BILITOT 0.6 10/20/2012   Lab Results  Component Value Date   CHOL 175 10/20/2012   Lab Results  Component Value Date   HDL 93.10 10/20/2012   Lab Results  Component Value Date   LDLCALC 72 10/20/2012   Lab Results  Component Value Date   TRIG 48.0 10/20/2012   Lab Results  Component Value Date   CHOLHDL 2 10/20/2012     Assessment & Plan  Abnormal LFTs Very mild, minimize carbs, recheck lfts and check an acute hepatitis panel and  consider Ultrasound if persists.  Dehydration, mild Is in training again and BUN is up slightly today, repeat renal panel at next visit. Encouraged increased hydration

## 2012-10-29 NOTE — Assessment & Plan Note (Signed)
Very mild, minimize carbs, recheck lfts and check an acute hepatitis panel and consider Ultrasound if persists.

## 2012-10-29 NOTE — Assessment & Plan Note (Signed)
Is in training again and BUN is up slightly today, repeat renal panel at next visit. Encouraged increased hydration

## 2012-10-30 ENCOUNTER — Other Ambulatory Visit: Payer: Self-pay | Admitting: Family Medicine

## 2012-10-30 ENCOUNTER — Telehealth: Payer: Self-pay

## 2012-10-30 DIAGNOSIS — R945 Abnormal results of liver function studies: Secondary | ICD-10-CM

## 2012-10-30 NOTE — Telephone Encounter (Signed)
Pt would like to proceed with Ultrasound today or tomorrow. Pt is concerned about liver function being elevated because she used to drink alcohol.  Please order.

## 2012-10-31 ENCOUNTER — Ambulatory Visit (HOSPITAL_BASED_OUTPATIENT_CLINIC_OR_DEPARTMENT_OTHER)
Admission: RE | Admit: 2012-10-31 | Discharge: 2012-10-31 | Disposition: A | Discharge status: Home / Self Care | Payer: BC Managed Care – PPO | Source: Ambulatory Visit | Attending: Family Medicine | Admitting: Family Medicine

## 2012-10-31 DIAGNOSIS — N281 Cyst of kidney, acquired: Secondary | ICD-10-CM | POA: Insufficient documentation

## 2012-10-31 DIAGNOSIS — Z9089 Acquired absence of other organs: Secondary | ICD-10-CM | POA: Insufficient documentation

## 2012-10-31 DIAGNOSIS — R7989 Other specified abnormal findings of blood chemistry: Secondary | ICD-10-CM | POA: Insufficient documentation

## 2012-10-31 DIAGNOSIS — R945 Abnormal results of liver function studies: Secondary | ICD-10-CM

## 2012-10-31 NOTE — Telephone Encounter (Signed)
Did yesterday

## 2012-10-31 NOTE — Telephone Encounter (Signed)
Did you do this referral?

## 2012-11-03 NOTE — Progress Notes (Signed)
Quick Note:  Patient Informed and voiced understanding ______ 

## 2012-11-27 ENCOUNTER — Encounter: Payer: Self-pay | Admitting: Physician Assistant

## 2012-11-27 ENCOUNTER — Ambulatory Visit (INDEPENDENT_AMBULATORY_CARE_PROVIDER_SITE_OTHER): Payer: BC Managed Care – PPO | Admitting: Physician Assistant

## 2012-11-27 VITALS — BP 110/78 | HR 64 | Temp 98.0°F | Resp 14 | Wt 113.0 lb

## 2012-11-27 DIAGNOSIS — S56912A Strain of unspecified muscles, fascia and tendons at forearm level, left arm, initial encounter: Secondary | ICD-10-CM

## 2012-11-27 DIAGNOSIS — IMO0002 Reserved for concepts with insufficient information to code with codable children: Secondary | ICD-10-CM

## 2012-11-27 DIAGNOSIS — S56919A Strain of unspecified muscles, fascia and tendons at forearm level, unspecified arm, initial encounter: Secondary | ICD-10-CM | POA: Insufficient documentation

## 2012-11-27 NOTE — Progress Notes (Signed)
Patient ID: Joan Lopez, female   DOB: [birth year], 62 y.o.   MRN: [id number]   Patient is a 62 year-old caucasian female who presents to clinic today c/o throbbing pain in Left forearm for the past 2-3 weeks.  Patient denies injury or trauma.  Denies elbow pain.  Denies wrist pain. Denies pain with motion.  Denies decrease in range of motion. Denies erythema, mass or swelling.  Endorses hx of recurrent lipomas.  Denies fever, chills.  Walks her 3 dogs and places leash around wrist. Has not taken anything for pain or tenderness.   Past Medical History  Diagnosis Date  . Bilateral bunions   . DDD (degenerative disc disease)     spine w narrowing  . Diverticulitis   . Guttate psoriasis   . Hemorrhoids   . Arthritis   . Fuch's endothelial dystrophy   . Acquired bilateral renal cysts   . History of chicken pox   . Hyperlipidemia   . Thrombocytopenia 09/27/2011  . Post-menopausal bleeding 10/08/2011  . Constipation 10/16/2011  . Hematuria 08/24/2011  . Exercise counseling 11/14/2011  . Dehydration, mild 12/26/2011  . Reflux 04/17/2012  . Lipoma of arm 07/13/2012  . 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 Mass of arm 07/13/2012    Has had them in past    . Lipoma of arm 10/07/2012    Left   . Abnormal LFTs 10/29/2012   Current Outpatient Prescriptions on File Prior to Visit  Medication Sig Dispense Refill  . loteprednol (LOTEMAX) 0.5 % ophthalmic suspension Place 1 drop into both eyes as directed. Monday, Wed, and Friday       No current facility-administered medications on file prior to visit.   Allergies  Allergen Reactions  . Augmentin [Amoxicillin-Pot Clavulanate]     Severe yeast infection-would prefer not to take.   Family History  Problem Relation Age of Onset  . Stroke Mother   . Heart disease Mother   . Kidney disease Father   . Breast cancer Sister 86  . Cancer Sister     ductal carcinoma breast- had it in 1999 and just found out its in the opposite breast  . Colon cancer  Maternal Aunt   . Cancer Maternal Aunt     colon  . Cancer Paternal Uncle     neck tumor  . Cancer Maternal Grandmother     brain tumor  . Breast cancer Paternal Grandmother     post menopausal   . Cancer Paternal Grandmother     breast  . Stomach cancer Maternal Aunt   . Cancer Maternal Aunt     stomach  . Kidney cancer Cousin   . Pneumonia Maternal Grandfather    History   Social History  . Marital Status: Married    Spouse Name: N/A    Number of Children: N/A  . Years of Education: N/A   Social History Main Topics  . Smoking status: Never Smoker   . Smokeless tobacco: Never Used  . Alcohol Use: No  . Drug Use: No  . Sexual Activity: Yes   Other Topics Concern  . None   Social History Narrative  . None   Review of Systems  Constitutional: Negative for fever, chills and weight loss.  Musculoskeletal: Positive for myalgias. Negative for joint pain.  Neurological: Negative for dizziness, tingling and sensory change.    Filed Vitals:   11/27/12 1122  BP: 110/78  Pulse: 64  Temp: 98 F (36.7 C)  Resp: 14  Physical Exam  Vitals reviewed. Constitutional: She is oriented to person, place, and time and well-developed, well-nourished, and in no distress.  HENT:  Head: Normocephalic and atraumatic.  Cardiovascular:  Good capillary refill  Musculoskeletal: Normal range of motion. She exhibits no edema.  No gross abnormality/defornmity of left elbow, wrist or phalanges.  No evidence of swelling or mass.  No evidence or erythema, warmth, excoriation or infection.  Neurological: She is alert and oriented to person, place, and time.    Assessment/Plan: Muscle strain of forearm Physical examination within normal limits.  Avoid overuse of left forearm and wrist.  Do not place lease around left wrist when walking dogs.  Topical aspercreme for soreness relief.  Ibuprofen if needed.  Return to clinic if symptoms acutely worsen or if swelling or redness  develop.

## 2012-11-27 NOTE — Patient Instructions (Signed)
No evidence of fracture or abnormality on exam.  Most likely just muscle sprain due to overuse.  Try to avoid overuse of left arm.  Can try Aspercreme topically to left forearm if aching/throbbing returns.  Should go away in the next few weeks.  Follow-up with Dr. Abner Greenspan at your next scheduled appointment.  Muscle Strain Muscle strain occurs when a muscle is stretched beyond its normal length. A small number of muscle fibers generally are torn. This is especially common in athletes. This happens when a sudden, violent force placed on a muscle stretches it too far. Usually, recovery from muscle strain takes 1 to 2 weeks. Complete healing will take 5 to 6 weeks.  HOME CARE INSTRUCTIONS   While awake, apply ice to the sore muscle for the first 2 days after the injury.  Put ice in a plastic bag.  Place a towel between your skin and the bag.  Leave the ice on for 15-20 minutes each hour.  Do not use the strained muscle for several days, until you no longer have pain.  You may wrap the injured area with an elastic bandage for comfort. Be careful not to wrap it too tightly. This may interfere with blood circulation or increase swelling.  Only take over-the-counter or prescription medicines for pain, discomfort, or fever as directed by your caregiver. SEEK MEDICAL CARE IF:  You have increasing pain or swelling in the injured area. MAKE SURE YOU:   Understand these instructions.  Will watch your condition.  Will get help right away if you are not doing well or get worse. Document Released: 04/02/2005 Document Revised: 06/25/2011 Document Reviewed: 04/14/2011 [facility] Patient Information 2014 [place], [place].

## 2012-11-27 NOTE — Assessment & Plan Note (Signed)
Physical examination within normal limits.  Avoid overuse of left forearm and wrist.  Do not place lease around left wrist when walking dogs.  Topical aspercreme for soreness relief.  Ibuprofen if needed.  Return to clinic if symptoms acutely worsen or if swelling or redness develop.

## 2012-12-22 ENCOUNTER — Other Ambulatory Visit (INDEPENDENT_AMBULATORY_CARE_PROVIDER_SITE_OTHER): Payer: BC Managed Care – PPO

## 2012-12-22 DIAGNOSIS — R945 Abnormal results of liver function studies: Secondary | ICD-10-CM

## 2012-12-22 DIAGNOSIS — R7989 Other specified abnormal findings of blood chemistry: Secondary | ICD-10-CM

## 2012-12-22 LAB — RENAL FUNCTION PANEL
Calcium: 9 mg/dL (ref 8.4–10.5)
GFR: 126.29 mL/min (ref >60.00)
Glucose, Bld: 102 mg/dL — ABNORMAL HIGH (ref 70–99)
Phosphorus: 3.9 mg/dL (ref 2.3–4.6)
Potassium: 3.5 mEq/L (ref 3.5–5.1)
Sodium: 141 mEq/L (ref 135–145)

## 2012-12-22 LAB — HEPATIC FUNCTION PANEL
ALT: 32 U/L (ref 0–35)
Bilirubin, Direct: 0.1 mg/dL (ref 0.0–0.3)
Total Bilirubin: 0.7 mg/dL (ref 0.3–1.2)

## 2012-12-22 NOTE — Progress Notes (Signed)
Labs only

## 2012-12-23 LAB — HEPATITIS PANEL, ACUTE
HCV Ab: NEGATIVE
Hep B C IgM: NEGATIVE

## 2012-12-24 NOTE — Progress Notes (Signed)
Quick Note:  Patient Informed and voiced understanding ______ 

## 2013-01-07 ENCOUNTER — Encounter: Payer: Self-pay | Admitting: Family Medicine

## 2013-01-07 ENCOUNTER — Ambulatory Visit (INDEPENDENT_AMBULATORY_CARE_PROVIDER_SITE_OTHER): Payer: BC Managed Care – PPO | Admitting: Family Medicine

## 2013-01-07 ENCOUNTER — Telehealth: Payer: Self-pay | Admitting: Family Medicine

## 2013-01-07 VITALS — BP 120/80 | HR 67 | Temp 98.2°F | Ht 60.5 in | Wt 113.1 lb

## 2013-01-07 DIAGNOSIS — K5900 Constipation, unspecified: Secondary | ICD-10-CM

## 2013-01-07 DIAGNOSIS — R945 Abnormal results of liver function studies: Secondary | ICD-10-CM

## 2013-01-07 DIAGNOSIS — Z0000 Encounter for general adult medical examination without abnormal findings: Secondary | ICD-10-CM

## 2013-01-07 DIAGNOSIS — N289 Disorder of kidney and ureter, unspecified: Secondary | ICD-10-CM

## 2013-01-07 DIAGNOSIS — R7989 Other specified abnormal findings of blood chemistry: Secondary | ICD-10-CM

## 2013-01-07 NOTE — Telephone Encounter (Signed)
Labs   Next month   Liver  Renal ,cbc   ,and  Also December   Liver  Renal hepatic cbc    @  [facility]

## 2013-01-11 ENCOUNTER — Encounter: Payer: Self-pay | Admitting: Family Medicine

## 2013-01-11 DIAGNOSIS — N289 Disorder of kidney and ureter, unspecified: Secondary | ICD-10-CM

## 2013-01-11 HISTORY — DX: Disorder of kidney and ureter, unspecified: N28.9

## 2013-01-11 NOTE — Assessment & Plan Note (Signed)
Mild, in training for marathon so reminded to hydrate well and repeat renal panel next month

## 2013-01-11 NOTE — Assessment & Plan Note (Signed)
LFTs normal, hepatitis screen neg

## 2013-01-11 NOTE — Assessment & Plan Note (Signed)
Well controlled, no changes, continue probiotics and hydration

## 2013-01-11 NOTE — Progress Notes (Signed)
Patient ID: Joan Lopez, female   DOB: 05/09/1950, 62 y.o.   MRN: [id number] LAKENZIE MCCLAFFERTY [id number] 10-03-1950 01/11/2013      Progress Note-Follow Up  Subjective  Chief Complaint  Chief Complaint  Patient presents with  . Follow-up    10 week    HPI  Patient is a 62 year old Caucasian female who is in training for marathon. She feels well. She offers no acute complaints today. Declines flu shot. Her arm is feeling better. She's been hydrating well. She denies any chest pain, palpitations shortness or breath GI or GU concerns at this time.  Past Medical History  Diagnosis Date  . Bilateral bunions   . DDD (degenerative disc disease)     spine w narrowing  . Diverticulitis   . Guttate psoriasis   . Hemorrhoids   . Arthritis   . Fuch's endothelial dystrophy   . Acquired bilateral renal cysts   . History of chicken pox   . Hyperlipidemia   . Thrombocytopenia 09/27/2011  . Post-menopausal bleeding 10/08/2011  . Constipation 10/16/2011  . Hematuria 08/24/2011  . Exercise counseling 11/14/2011  . Dehydration, mild 12/26/2011  . Reflux 04/17/2012  . Lipoma of arm 07/13/2012  . 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 Mass of arm 07/13/2012    Has had them in past    . Lipoma of arm 10/07/2012    Left   . Abnormal LFTs 10/29/2012  . Renal insufficiency 01/11/2013    Past Surgical History  Procedure Laterality Date  . Appendectomy    . Tubal ligation    . Cholecystectomy    . Cesarean section    . Lipoma excision      x 2, both arms  . Wrist fracture surgery      right, titanium plate and pins  . Colonoscopy  2005    [place] - diverticulosis and hemorrhoids  . Tonsillectomy      possible adenoids  . Total abdominal hysterectomy      for fibroids  . Corneal transplant      Family History  Problem Relation Age of Onset  . Stroke Mother   . Heart disease Mother   . Kidney disease Father   . Breast cancer Sister 71  . Cancer Sister     ductal carcinoma breast- had  it in 1999 and just found out its in the opposite breast  . Colon cancer Maternal Aunt   . Cancer Maternal Aunt     colon  . Cancer Paternal Uncle     neck tumor  . Cancer Maternal Grandmother     brain tumor  . Breast cancer Paternal Grandmother     post menopausal   . Cancer Paternal Grandmother     breast  . Stomach cancer Maternal Aunt   . Cancer Maternal Aunt     stomach  . Kidney cancer Cousin   . Pneumonia Maternal Grandfather     History   Social History  . Marital Status: Married    Spouse Name: N/A    Number of Children: N/A  . Years of Education: N/A   Occupational History  . Not on file.   Social History Main Topics  . Smoking status: Never Smoker   . Smokeless tobacco: Never Used  . Alcohol Use: No  . Drug Use: No  . Sexual Activity: Yes   Other Topics Concern  . Not on file   Social History Narrative  . No narrative on file  Current Outpatient Prescriptions on File Prior to Visit  Medication Sig Dispense Refill  . loteprednol (LOTEMAX) 0.5 % ophthalmic suspension Place 1 drop into both eyes as directed. Monday, Wed, and Friday       No current facility-administered medications on file prior to visit.    Allergies  Allergen Reactions  . Augmentin [Amoxicillin-Pot Clavulanate]     Severe yeast infection-would prefer not to take.    Review of Systems  Review of Systems  Constitutional: Negative for fever and malaise/fatigue.  HENT: Negative for congestion.   Eyes: Negative for discharge.  Respiratory: Negative for shortness of breath.   Cardiovascular: Negative for chest pain, palpitations and leg swelling.  Gastrointestinal: Negative for nausea, abdominal pain and diarrhea.  Genitourinary: Negative for dysuria.  Musculoskeletal: Negative for falls.  Skin: Negative for rash.  Neurological: Negative for loss of consciousness and headaches.  Endo/Heme/Allergies: Negative for polydipsia.  Psychiatric/Behavioral: Negative for depression  and suicidal ideas. The patient is not nervous/anxious and does not have insomnia.     Objective  BP 120/80  Pulse 67  Temp(Src) 98.2 F (36.8 C) (Oral)  Ht 5' 0.5" (1.537 m)  Wt 113 lb 1.3 oz (51.293 kg)  BMI 21.71 kg/m2  SpO2 96%  Physical Exam  Physical Exam  Constitutional: She is oriented to person, place, and time and well-developed, well-nourished, and in no distress. No distress.  HENT:  Head: Normocephalic and atraumatic.  Eyes: Conjunctivae are normal.  Neck: Neck supple. No thyromegaly present.  Cardiovascular: Normal rate, regular rhythm and normal heart sounds.   No murmur heard. Pulmonary/Chest: Effort normal and breath sounds normal. She has no wheezes.  Abdominal: She exhibits no distension and no mass.  Musculoskeletal: She exhibits no edema.  Lymphadenopathy:    She has no cervical adenopathy.  Neurological: She is alert and oriented to person, place, and time.  Skin: Skin is warm and dry. No rash noted. She is not diaphoretic.  Psychiatric: Memory, affect and judgment normal.    Lab Results  Component Value Date   TSH 1.33 10/20/2012   Lab Results  Component Value Date   WBC 4.7 10/20/2012   HGB 12.8 10/20/2012   HCT 38.3 10/20/2012   MCV 91.6 10/20/2012   PLT 161.0 10/20/2012   Lab Results  Component Value Date   CREATININE 0.5 12/22/2012   BUN 33* 12/22/2012   NA 141 12/22/2012   K 3.5 12/22/2012   CL 106 12/22/2012   CO2 28 12/22/2012   Lab Results  Component Value Date   ALT 32 12/22/2012   AST 37 12/22/2012   ALKPHOS 52 12/22/2012   BILITOT 0.7 12/22/2012   Lab Results  Component Value Date   CHOL 175 10/20/2012   Lab Results  Component Value Date   HDL 93.10 10/20/2012   Lab Results  Component Value Date   LDLCALC 72 10/20/2012   Lab Results  Component Value Date   TRIG 48.0 10/20/2012   Lab Results  Component Value Date   CHOLHDL 2 10/20/2012     Assessment & Plan  Constipation Well controlled, no changes, continue probiotics and  hydration  Abnormal LFTs LFTs normal, hepatitis screen neg  Renal insufficiency Mild, in training for marathon so reminded to hydrate well and repeat renal panel next month

## 2013-01-24 ENCOUNTER — Emergency Department
Admission: EM | Admit: 2013-01-24 | Discharge: 2013-01-24 | Disposition: A | Discharge status: Home / Self Care | Payer: BC Managed Care – PPO | Source: Home / Self Care | Attending: Family Medicine | Admitting: Family Medicine

## 2013-01-24 ENCOUNTER — Encounter: Payer: Self-pay | Admitting: Emergency Medicine

## 2013-01-24 ENCOUNTER — Emergency Department (INDEPENDENT_AMBULATORY_CARE_PROVIDER_SITE_OTHER): Payer: BC Managed Care – PPO

## 2013-01-24 DIAGNOSIS — X58XXXA Exposure to other specified factors, initial encounter: Secondary | ICD-10-CM

## 2013-01-24 DIAGNOSIS — S62609A Fracture of unspecified phalanx of unspecified finger, initial encounter for closed fracture: Secondary | ICD-10-CM

## 2013-01-24 DIAGNOSIS — IMO0002 Reserved for concepts with insufficient information to code with codable children: Secondary | ICD-10-CM

## 2013-01-24 MED ORDER — MELOXICAM 15 MG PO TABS: 15.0000 mg | ORAL_TABLET | Freq: Every day | ORAL | Status: DC

## 2013-01-24 NOTE — ED Notes (Signed)
Joan Lopez's dog ran off while on a leash and pulled her fingers in her left hand today. The pain is aching and is a 3/10.

## 2013-01-24 NOTE — ED Provider Notes (Signed)
CSN: [id number]     Arrival date & time 01/24/13  1326 History   First MD Initiated Contact with Patient 01/24/13 1327     Chief Complaint  Patient presents with  . Finger Injury    today    HPI  Finger pain x 1 day  Pt was walking dog, wrapped leash around fingers.  Pt lunged after another dog and pulled on pt's hand extremely hard  Has had 3rd and 4th finger pain since this point Mild swelling No numbness or paresthesias.  Pt is here today because she wants to be sure that fingers are broken.  Is training for the [place] marathon in 1 month.   Past Medical History  Diagnosis Date  . Bilateral bunions   . DDD (degenerative disc disease)     spine w narrowing  . Diverticulitis   . Guttate psoriasis   . Hemorrhoids   . Arthritis   . Fuch's endothelial dystrophy   . Acquired bilateral renal cysts   . History of chicken pox   . Hyperlipidemia   . Thrombocytopenia 09/27/2011  . Post-menopausal bleeding 10/08/2011  . Constipation 10/16/2011  . Hematuria 08/24/2011  . Exercise counseling 11/14/2011  . Dehydration, mild 12/26/2011  . Reflux 04/17/2012  . Lipoma of arm 07/13/2012  . 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 Mass of arm 07/13/2012    Has had them in past    . Lipoma of arm 10/07/2012    Left   . Abnormal LFTs 10/29/2012  . Renal insufficiency 01/11/2013   Past Surgical History  Procedure Laterality Date  . Appendectomy    . Tubal ligation    . Cholecystectomy    . Cesarean section    . Lipoma excision      x 2, both arms  . Wrist fracture surgery      right, titanium plate and pins  . Colonoscopy  2005    [place] - diverticulosis and hemorrhoids  . Tonsillectomy      possible adenoids  . Total abdominal hysterectomy      for fibroids  . Corneal transplant     Family History  Problem Relation Age of Onset  . Stroke Mother   . Heart disease Mother   . Kidney disease Father   . Breast cancer Sister 74  . Cancer Sister     ductal carcinoma breast- had it in 1999  and just found out its in the opposite breast  . Colon cancer Maternal Aunt   . Cancer Maternal Aunt     colon  . Cancer Paternal Uncle     neck tumor  . Cancer Maternal Grandmother     brain tumor  . Breast cancer Paternal Grandmother     post menopausal   . Cancer Paternal Grandmother     breast  . Stomach cancer Maternal Aunt   . Cancer Maternal Aunt     stomach  . Kidney cancer Cousin   . Pneumonia Maternal Grandfather    History  Substance Use Topics  . Smoking status: Never Smoker   . Smokeless tobacco: Never Used  . Alcohol Use: No   OB History   Grav Para Term Preterm Abortions TAB SAB Ect Mult Living                 Review of Systems  All other systems reviewed and are negative.    Allergies  Augmentin  Home Medications   Current Outpatient Rx  Name  Route  Sig  Dispense  Refill  . loteprednol (LOTEMAX) 0.5 % ophthalmic suspension   Both Eyes   Place 1 drop into both eyes as directed. Monday, Wed, and Friday          There were no vitals taken for this visit. Physical Exam  Constitutional: She appears well-developed and well-nourished.  HENT:  Head: Normocephalic and atraumatic.  Eyes: Conjunctivae are normal. Pupils are equal, round, and reactive to light.  Neck: Normal range of motion.  Cardiovascular: Normal rate and regular rhythm.   Pulmonary/Chest: Effort normal.  Abdominal: Soft.  Musculoskeletal:       Hands: + TTP and swelling over 3rd and 4th metatarsal  Decreased grip strength 2/2 pain  DIP flexion/extension intact  Neurovascularly intact distally    Neurological: She is alert.  Skin: Skin is warm.    ED Course  Procedures (including critical care time) Labs Review Labs Reviewed - No data to display Imaging Review Dg Hand Complete Left  01/24/2013   CLINICAL DATA:  Pulling injury to the left hand. Pain.  EXAM: LEFT HAND - COMPLETE 3+ VIEW  COMPARISON:  None.  FINDINGS: There are fractures of the middle phalanx of the  middle finger and the proximal phalanx of the ring finger, both nondisplaced and nonangulated. The ring finger proximal phalanx fracture is spiral extending from the distal radial shaft to the base, without involving either articular surface. The fracture of the middle phalanx of the middle finger is oblique from the proximal ulnar base to the radial midshaft, also without involvement of the articular surfaces. There is no fracture angulation. There is associated soft tissue swelling.  No other fractures. No dislocation.  IMPRESSION: Nondisplaced fractures of the middle phalanx of the left middle finger and the proximal phalanx of the left ring finger.   Electronically Signed   By: Amie Portland M.D.   On: 01/24/2013 14:28      MDM   1. Phalanx (hand) fracture, closed, initial encounter   Affected fingers splinted and buddy taped.  RICE and NSAIDs.  Plan for followup with sports medicine.  Start vit D supplement MSK red flags discussed     The patient and/or caregiver has been counseled thoroughly with regard to treatment plan and/or medications prescribed including dosage, schedule, interactions, rationale for use, and possible side effects and they verbalize understanding. Diagnoses and expected course of recovery discussed and will return if not improved as expected or if the condition worsens. Patient and/or caregiver verbalized understanding.          Doree Albee, MD 01/24/13 1500

## 2013-01-29 ENCOUNTER — Ambulatory Visit (INDEPENDENT_AMBULATORY_CARE_PROVIDER_SITE_OTHER): Payer: BC Managed Care – PPO | Admitting: Sports Medicine

## 2013-01-29 ENCOUNTER — Encounter: Payer: Self-pay | Admitting: Sports Medicine

## 2013-01-29 VITALS — BP 110/67 | HR 66 | Wt 115.0 lb

## 2013-01-29 DIAGNOSIS — S62609A Fracture of unspecified phalanx of unspecified finger, initial encounter for closed fracture: Secondary | ICD-10-CM | POA: Insufficient documentation

## 2013-01-29 NOTE — Assessment & Plan Note (Signed)
4 days after fractures of the left third middle phalanx as well as the left fourth proximal phalanx. Initially he did suspect rotational deformity, but and she has a similar scissoring of her third and fourth fingers on the contralateral hand. Forearm splint placed encompassing the fingers. She does not have any pain. Return in 2 weeks, x-ray before visit. She does desire to run the [employer] in about 2 weeks, I think this will be appropriate.  I billed a fracture code for this visit, all subsequent visits for this complaint will be "post-op checks" in the global period.

## 2013-01-29 NOTE — Progress Notes (Signed)
   Subjective:    I'm seeing this patient as a consultation for:  Dr. Alvester Morin  CC: Finger pain  HPI: Approximately 4-5 days ago this pleasant young lady was walking her dog, the dog ran, jerked her hand, causing immediate pain, swelling, and bruising. She went to urgent care where x-rays were done that showed fractures of her third middle phalanx and her fourth proximal phalanx. She was placed in extension splints, and referred to me for further evaluation and definitive treatment. Pain is mild, persistent, no radiation. She does have the [place] marathon coming up in the beginning of November and would like to run this race.  Past medical history, Surgical history, Family history not pertinant except as noted below, Social history, Allergies, and medications have been entered into the medical record, reviewed, and no changes needed.   Review of Systems: No headache, visual changes, nausea, vomiting, diarrhea, constipation, dizziness, abdominal pain, skin rash, fevers, chills, night sweats, weight loss, swollen lymph nodes, body aches, joint swelling, muscle aches, chest pain, shortness of breath, mood changes, visual or auditory hallucinations.   Objective:   General: Well Developed, well nourished, and in no acute distress.  Neuro/Psych: Alert and oriented x3, extra-ocular muscles intact, able to move all 4 extremities, sensation grossly intact. Skin: Warm and dry, no rashes noted.  Respiratory: Not using accessory muscles, speaking in full sentences, trachea midline.  Cardiovascular: Pulses palpable, no extremity edema. Abdomen: Does not appear distended. Left hand: There is bruising and swelling over the fourth and fifth digits. She is very little tenderness to palpation, when she makes a partial fist, the third finger does appear to scissor over the fourth, however this is very similar on the contralateral hand.  X-rays were reviewed and show nondisplaced, non-angulated fractures of  the shafts of the left third middle phalanx and the left fourth proximal phalanx.  Forearm splint encompassing the digits was placed.  Impression and Recommendations:   This case required medical decision making of moderate complexity.

## 2013-01-30 ENCOUNTER — Other Ambulatory Visit (INDEPENDENT_AMBULATORY_CARE_PROVIDER_SITE_OTHER): Payer: BC Managed Care – PPO

## 2013-01-30 DIAGNOSIS — Z0000 Encounter for general adult medical examination without abnormal findings: Secondary | ICD-10-CM

## 2013-01-30 LAB — HEPATIC FUNCTION PANEL
ALT: 53 U/L — ABNORMAL HIGH (ref 0–35)
Bilirubin, Direct: 0.1 mg/dL (ref 0.0–0.3)
Total Bilirubin: 0.6 mg/dL (ref 0.3–1.2)
Total Protein: 6.8 g/dL (ref 6.0–8.3)

## 2013-01-30 LAB — CBC
MCHC: 33.7 g/dL (ref 30.0–36.0)
MCV: 90.6 fl (ref 78.0–100.0)
Platelets: 158 10*3/uL (ref 150.0–400.0)
RDW: 13.5 % (ref 11.5–14.6)

## 2013-01-30 LAB — RENAL FUNCTION PANEL
Albumin: 4 g/dL (ref 3.5–5.2)
BUN: 19 mg/dL (ref 6–23)
Calcium: 9.1 mg/dL (ref 8.4–10.5)
Chloride: 103 mEq/L (ref 96–112)
Creatinine, Ser: 0.5 mg/dL (ref 0.4–1.2)
Glucose, Bld: 84 mg/dL (ref 70–99)
Phosphorus: 2.9 mg/dL (ref 2.3–4.6)

## 2013-01-30 NOTE — Progress Notes (Signed)
Labs only

## 2013-02-03 ENCOUNTER — Encounter: Payer: Self-pay | Admitting: Family Medicine

## 2013-02-04 ENCOUNTER — Telehealth: Payer: Self-pay | Admitting: *Deleted

## 2013-02-04 NOTE — Telephone Encounter (Signed)
Labs normal except lfts up again slightly

## 2013-02-04 NOTE — Telephone Encounter (Signed)
Pt left message requesting lab results from last Friday.  Please advise.

## 2013-02-05 NOTE — Telephone Encounter (Signed)
FYI: Pt informed and states she fractured two fingers the Saturday before and she read that this can make the numbers go up.

## 2013-02-10 ENCOUNTER — Ambulatory Visit (INDEPENDENT_AMBULATORY_CARE_PROVIDER_SITE_OTHER): Payer: BC Managed Care – PPO | Admitting: Sports Medicine

## 2013-02-10 ENCOUNTER — Ambulatory Visit (INDEPENDENT_AMBULATORY_CARE_PROVIDER_SITE_OTHER): Payer: BC Managed Care – PPO

## 2013-02-10 ENCOUNTER — Encounter: Payer: Self-pay | Admitting: Sports Medicine

## 2013-02-10 VITALS — BP 115/79 | HR 69 | Wt 115.0 lb

## 2013-02-10 DIAGNOSIS — IMO0001 Reserved for inherently not codable concepts without codable children: Secondary | ICD-10-CM

## 2013-02-10 DIAGNOSIS — S62609D Fracture of unspecified phalanx of unspecified finger, subsequent encounter for fracture with routine healing: Secondary | ICD-10-CM

## 2013-02-10 DIAGNOSIS — S62609A Fracture of unspecified phalanx of unspecified finger, initial encounter for closed fracture: Secondary | ICD-10-CM

## 2013-02-10 NOTE — Assessment & Plan Note (Signed)
Stable alignment at 2 weeks. Buddy taped. I am clearing her to run in [place]. But I need to see her back in 2 weeks. Unless there is been a major change, no further x-rays needed.

## 2013-02-10 NOTE — Progress Notes (Signed)
  Subjective: Two-week status post fractures of the left third middle and left fourth proximal phalanx, nondisplaced, non-angulated. She has been in a splint for 2 weeks, and returns with very little pain. She does have the [employer] marathon coming up on Sunday.   Objective: General: Well-developed, well-nourished, and in no acute distress. Splint is removed, there is still bruising, but swelling has improved, and there is only minimal tenderness to palpation at the fracture sites. Range of motion is very limited as expected.  X-rays were reviewed, and show stable positioning and alignment of the fractures. I buddy tape the fourth and fifth fingers together. Assessment/plan:

## 2013-02-23 ENCOUNTER — Encounter: Payer: Self-pay | Admitting: Sports Medicine

## 2013-02-23 ENCOUNTER — Ambulatory Visit (INDEPENDENT_AMBULATORY_CARE_PROVIDER_SITE_OTHER): Payer: BC Managed Care – PPO | Admitting: Sports Medicine

## 2013-02-23 VITALS — BP 134/75 | HR 59 | Wt 114.0 lb

## 2013-02-23 DIAGNOSIS — S62609D Fracture of unspecified phalanx of unspecified finger, subsequent encounter for fracture with routine healing: Secondary | ICD-10-CM

## 2013-02-23 DIAGNOSIS — S62609A Fracture of unspecified phalanx of unspecified finger, initial encounter for closed fracture: Secondary | ICD-10-CM

## 2013-02-23 NOTE — Progress Notes (Signed)
  Subjective: 4 weeks status post fractures of the left third middle and left fourth proximal phalanx, nondisplaced, non-angulated. She has been in a splint for 2 weeks, and buddy taping for the next 2 weeks, she did well in the [place]. She still has some pain but continues to improve significantly.   Objective: General: Well-developed, well-nourished, and in no acute distress. Minimal tenderness to palpation over the fracture site, no further bruising or swelling. Range of motion is very limited as expected.  I buddy taped the fourth and fifth fingers together.  Assessment/plan:

## 2013-02-23 NOTE — Assessment & Plan Note (Signed)
Re\re buddy tape the fingers together. I advised her to home range of motion exercises. Return in 2 weeks, I will likely turn her loose at that point.

## 2013-03-09 ENCOUNTER — Ambulatory Visit (INDEPENDENT_AMBULATORY_CARE_PROVIDER_SITE_OTHER): Payer: BC Managed Care – PPO | Admitting: Sports Medicine

## 2013-03-09 ENCOUNTER — Encounter: Payer: Self-pay | Admitting: Sports Medicine

## 2013-03-09 VITALS — BP 105/69 | HR 66 | Wt 115.0 lb

## 2013-03-09 DIAGNOSIS — IMO0001 Reserved for inherently not codable concepts without codable children: Secondary | ICD-10-CM

## 2013-03-09 DIAGNOSIS — S62609D Fracture of unspecified phalanx of unspecified finger, subsequent encounter for fracture with routine healing: Secondary | ICD-10-CM

## 2013-03-09 NOTE — Assessment & Plan Note (Signed)
Doing well 

## 2013-03-09 NOTE — Progress Notes (Signed)
  Subjective: 6 weeks status post 2 fractures in her hand, doing well. Only minimal pain.  She did well in [place], and does have a brace in [place] coming up soon, this will be a 5K, then 10 K., then half marathon, and informed her of all in a row.   Objective: General: Well-developed, well-nourished, and in no acute distress. There's no tenderness to palpation over the fracture site, she does have some pain with extremes of range of motion, and laxative flexion in her third PIP and DIP joints.  Assessment/plan:

## 2013-04-06 ENCOUNTER — Ambulatory Visit: Payer: BC Managed Care – PPO | Admitting: Family Medicine

## 2013-04-07 ENCOUNTER — Ambulatory Visit: Payer: BC Managed Care – PPO | Admitting: Family Medicine

## 2013-04-10 ENCOUNTER — Ambulatory Visit (INDEPENDENT_AMBULATORY_CARE_PROVIDER_SITE_OTHER): Payer: BC Managed Care – PPO

## 2013-04-10 ENCOUNTER — Encounter: Payer: Self-pay | Admitting: Sports Medicine

## 2013-04-10 ENCOUNTER — Ambulatory Visit (INDEPENDENT_AMBULATORY_CARE_PROVIDER_SITE_OTHER): Payer: BC Managed Care – PPO | Admitting: Sports Medicine

## 2013-04-10 VITALS — BP 134/80 | HR 66 | Wt 117.0 lb

## 2013-04-10 DIAGNOSIS — M79609 Pain in unspecified limb: Secondary | ICD-10-CM

## 2013-04-10 DIAGNOSIS — M79661 Pain in right lower leg: Secondary | ICD-10-CM

## 2013-04-10 MED ORDER — CALCIUM CARBONATE-VITAMIN D 600-400 MG-UNIT PO TABS: 1.0000 | ORAL_TABLET | Freq: Two times a day (BID) | ORAL | Status: DC

## 2013-04-10 NOTE — Assessment & Plan Note (Signed)
X-rays are negative, ordering MRI of the right tibia.

## 2013-04-10 NOTE — Assessment & Plan Note (Addendum)
Unfortunately my concern here is for a tibial stress injury. X-rays, air cast. Calcium/vitamin D supplement. Avoid NSAIDs. If x-rays are negative we are going to proceed with an MRI of the tibia and fibula. She does have a 5K, 10K, half marathon, and a marathon coming up in 2 weeks. I have told her that depending on results of the imaging studies it is doubtful that she will be able to run these. At future visits when she is healed we should certainly perform a full gait analysis.

## 2013-04-10 NOTE — Progress Notes (Signed)
  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 It's worse when running, better with rest. Pain is very localized, no radiation, is associated with some swelling and she denies any trauma. She is currently training for a [employer] which will consist of a 5K run, 10K run, half marathon, and full marathon in 4 days. Symptoms are moderate, persistent.  Past medical history, Surgical history, Family history not pertinant except as noted below, Social history, Allergies, and medications have been entered into the medical record, reviewed, and no changes needed.   Review of Systems: No fevers, chills, night sweats, weight loss, chest pain, or shortness of breath.   Objective:    General: Well Developed, well nourished, and in no acute distress.  Neuro: Alert and oriented x3, extra-ocular muscles intact, sensation grossly intact.  HEENT: Normocephalic, atraumatic, pupils equal round reactive to light, neck supple, no masses, no lymphadenopathy, thyroid nonpalpable.  Skin: Warm and dry, no rashes. Cardiac: Regular rate and rhythm, no murmurs rubs or gallops, no lower extremity edema.  Respiratory: Clear to auscultation bilaterally. Not using accessory muscles, speaking in full sentences. Right leg: There is one discrete area of tenderness to palpation over the anterior shin at the junction of the distal and middle two thirds. She really has no tenderness to palpation along the posterior medial tibial border decreasing the likelihood of shin splints. She has a negative tuning fork test, ankle exam and knee exams are unremarkable. Negative Thompson's test, negative Homans sign.  Impression and Recommendations:

## 2013-04-13 ENCOUNTER — Telehealth: Payer: Self-pay | Admitting: *Deleted

## 2013-04-13 NOTE — Telephone Encounter (Signed)
No PA required for MRI Tibia/Fibula RT w/o.  Meyer Cory, LPN

## 2013-04-14 ENCOUNTER — Ambulatory Visit: Payer: BC Managed Care – PPO | Admitting: Sports Medicine

## 2013-04-14 ENCOUNTER — Ambulatory Visit (INDEPENDENT_AMBULATORY_CARE_PROVIDER_SITE_OTHER): Payer: BC Managed Care – PPO

## 2013-04-14 DIAGNOSIS — M79A29 Nontraumatic compartment syndrome of unspecified lower extremity: Secondary | ICD-10-CM

## 2013-04-14 DIAGNOSIS — M79661 Pain in right lower leg: Secondary | ICD-10-CM

## 2013-04-15 ENCOUNTER — Encounter: Payer: Self-pay | Admitting: Sports Medicine

## 2013-04-15 ENCOUNTER — Ambulatory Visit (INDEPENDENT_AMBULATORY_CARE_PROVIDER_SITE_OTHER): Payer: BC Managed Care – PPO | Admitting: Sports Medicine

## 2013-04-15 VITALS — BP 145/76 | HR 76 | Wt 118.0 lb

## 2013-04-15 DIAGNOSIS — M79609 Pain in unspecified limb: Secondary | ICD-10-CM

## 2013-04-15 DIAGNOSIS — M79661 Pain in right lower leg: Secondary | ICD-10-CM

## 2013-04-15 NOTE — Assessment & Plan Note (Addendum)
Initially I thought this represented a tibial stress injury in this marathon runner. Fortunately the MRI showed a simple anterior compartment fasciitis. At this point we will discontinue the air cast, and switch to an anti-inflammatory. Like to see her back on Monday, if she is not better we can try a guided injection of medication into the anterior compartment. We can use the next visit like a game day decision, if she is better she can cancel the visit.

## 2013-04-15 NOTE — Progress Notes (Signed)
  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 It had an initial suspicion for a stress fracture so we obtained an early MRI. Next week she does have 4 races scheduled. Overall her pain has been decreasing, and I placed her in an air cast.  Past medical history, Surgical history, Family history not pertinant except as noted below, Social history, Allergies, and medications have been entered into the medical record, reviewed, and no changes needed.   Review of Systems: No fevers, chills, night sweats, weight loss, chest pain, or shortness of breath.   Objective:    General: Well Developed, well nourished, and in no acute distress.  Neuro: Alert and oriented x3, extra-ocular muscles intact, sensation grossly intact.  HEENT: Normocephalic, atraumatic, pupils equal round reactive to light, neck supple, no masses, no lymphadenopathy, thyroid nonpalpable.  Skin: Warm and dry, no rashes. Cardiac: Regular rate and rhythm, no murmurs rubs or gallops, no lower extremity edema.  Respiratory: Clear to auscultation bilaterally. Not using accessory muscles, speaking in full sentences. Right lower leg: There still is some tenderness to palpation and swelling without erythema over the anterior shin. There is reproduction of pain with resisted dorsiflexion of her ankle.  MRI was reviewed with the patient, there does appear to be increased T2 signal along the crural fascia of the anterior compartment. There is no sign of a tibial stress injury.  Impression and Recommendations:

## 2013-04-20 ENCOUNTER — Ambulatory Visit: Payer: BC Managed Care – PPO | Admitting: Sports Medicine

## 2013-04-24 ENCOUNTER — Ambulatory Visit: Payer: BC Managed Care – PPO | Admitting: Sports Medicine

## 2013-05-11 ENCOUNTER — Other Ambulatory Visit: Payer: BC Managed Care – PPO

## 2013-05-18 ENCOUNTER — Ambulatory Visit: Payer: BC Managed Care – PPO | Admitting: Family Medicine

## 2013-06-15 ENCOUNTER — Other Ambulatory Visit: Payer: Self-pay

## 2013-06-15 DIAGNOSIS — Z1231 Encounter for screening mammogram for malignant neoplasm of breast: Secondary | ICD-10-CM

## 2013-07-20 ENCOUNTER — Ambulatory Visit
Admission: RE | Admit: 2013-07-20 | Discharge: 2013-07-20 | Disposition: A | Discharge status: Home / Self Care | Payer: BC Managed Care – PPO | Source: Ambulatory Visit

## 2013-07-20 DIAGNOSIS — Z1231 Encounter for screening mammogram for malignant neoplasm of breast: Secondary | ICD-10-CM

## 2013-07-27 ENCOUNTER — Encounter: Payer: Self-pay | Admitting: Internal Medicine

## 2013-08-06 ENCOUNTER — Encounter: Payer: Self-pay | Admitting: Sports Medicine

## 2013-08-06 ENCOUNTER — Ambulatory Visit (INDEPENDENT_AMBULATORY_CARE_PROVIDER_SITE_OTHER): Payer: BC Managed Care – PPO | Admitting: Sports Medicine

## 2013-08-06 VITALS — BP 154/89 | HR 69 | Ht 61.0 in | Wt 123.0 lb

## 2013-08-06 DIAGNOSIS — F411 Generalized anxiety disorder: Secondary | ICD-10-CM

## 2013-08-06 NOTE — Progress Notes (Signed)
  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 Unfortunately for the past several weeks she's noted increasing jitteriness, a feeling of unease, and anxiety. She tells me that this typically occurs after screening tests, while waiting for the results. We had a long discussion about the symptoms, and she desires to work through it herself rather than pursue pharmacologic or behavioral intervention.  Past medical history, Surgical history, Family history not pertinant except as noted below, Social history, Allergies, and medications have been entered into the medical record, reviewed, and no changes needed.   Review of Systems: No fevers, chills, night sweats, weight loss, chest pain, or shortness of breath.   Objective:    General: Well Developed, well nourished, and in no acute distress.  Neuro: Alert and oriented x3, extra-ocular muscles intact, sensation grossly intact.  HEENT: Normocephalic, atraumatic, pupils equal round reactive to light, neck supple, no masses, no lymphadenopathy, thyroid nonpalpable.  Skin: Warm and dry, no rashes. Cardiac: Regular rate and rhythm, no murmurs rubs or gallops, no lower extremity edema.  Respiratory: Clear to auscultation bilaterally. Not using accessory muscles, speaking in full sentences.  Impression and Recommendations:

## 2013-08-06 NOTE — Patient Instructions (Signed)
Generalized Anxiety Disorder Generalized anxiety disorder (GAD) is a mental disorder. It interferes with life functions, including relationships, work, and school. GAD is different from normal anxiety, which everyone experiences at some point in their lives in response to specific life events and activities. Normal anxiety actually helps us prepare for and get through these life events and activities. Normal anxiety goes away after the event or activity is over.  GAD causes anxiety that is not necessarily related to specific events or activities. It also causes excess anxiety in proportion to specific events or activities. The anxiety associated with GAD is also difficult to control. GAD can vary from mild to severe. People with severe GAD can have intense waves of anxiety with physical symptoms (panic attacks).  SYMPTOMS The anxiety and worry associated with GAD are difficult to control. This anxiety and worry are related to many life events and activities and also occur more days than not for 6 months or longer. People with GAD also have three or more of the following symptoms (one or more in children):  Restlessness.   Fatigue.  Difficulty concentrating.   Irritability.  Muscle tension.  Difficulty sleeping or unsatisfying sleep. DIAGNOSIS GAD is diagnosed through an assessment by your caregiver. Your caregiver will ask you questions aboutyour mood,physical symptoms, and events in your life. Your caregiver may ask you about your medical history and use of alcohol or drugs, including prescription medications. Your caregiver may also do a physical exam and blood tests. Certain medical conditions and the use of certain substances can cause symptoms similar to those associated with GAD. Your caregiver may refer you to a mental health specialist for further evaluation. TREATMENT The following therapies are usually used to treat GAD:   Medication Antidepressant medication usually is  prescribed for long-term daily control. Antianxiety medications may be added in severe cases, especially when panic attacks occur.   Talk therapy (psychotherapy) Certain types of talk therapy can be helpful in treating GAD by providing support, education, and guidance. A form of talk therapy called cognitive behavioral therapy can teach you healthy ways to think about and react to daily life events and activities.  Stress managementtechniques These include yoga, meditation, and exercise and can be very helpful when they are practiced regularly. A mental health specialist can help determine which treatment is best for you. Some people see improvement with one therapy. However, other people require a combination of therapies. Document Released: 07/28/2012 Document Reviewed: 07/28/2012 [employer] Patient Information 2014 [employer].  

## 2013-08-06 NOTE — Assessment & Plan Note (Signed)
I think her anxiety tends to center around the unknown after screening tests. She tells me that she wants to work through her stresses in her anxiety is without pharmacologic or behavioral intervention. I sure we could offer hydroxyzine should her anxiety spike. She will let me know, it sounds as she is also wanting to switch to [name] for primary care.

## 2013-08-20 ENCOUNTER — Ambulatory Visit (INDEPENDENT_AMBULATORY_CARE_PROVIDER_SITE_OTHER): Payer: BC Managed Care – PPO | Admitting: Sports Medicine

## 2013-08-20 ENCOUNTER — Encounter: Payer: Self-pay | Admitting: Sports Medicine

## 2013-08-20 VITALS — BP 144/88 | HR 70 | Ht 60.5 in | Wt 121.0 lb

## 2013-08-20 DIAGNOSIS — F411 Generalized anxiety disorder: Secondary | ICD-10-CM

## 2013-08-20 DIAGNOSIS — Z0000 Encounter for general adult medical examination without abnormal findings: Secondary | ICD-10-CM | POA: Insufficient documentation

## 2013-08-20 DIAGNOSIS — I10 Essential (primary) hypertension: Secondary | ICD-10-CM

## 2013-08-20 DIAGNOSIS — Z299 Encounter for prophylactic measures, unspecified: Secondary | ICD-10-CM

## 2013-08-20 MED ORDER — HYDROXYZINE HCL 50 MG PO TABS: 50.0000 mg | ORAL_TABLET | Freq: Three times a day (TID) | ORAL | Status: DC | PRN

## 2013-08-20 NOTE — Progress Notes (Signed)
  Subjective:    CC: Followup  HPI: Elevated blood pressure: Has now been high on several occasions, anywhere from the 921J to 941D systolic. She does endorse significant stress and poor diet with increased intake of salt rich foods. She tells me that she would like to decrease her salt intake, she has her colonoscopy coming up and feels as though she will be much less stressed out afterwards.  Past medical history, Surgical history, Family history not pertinant except as noted below, Social history, Allergies, and medications have been entered into the medical record, reviewed, and no changes needed.   Review of Systems: No fevers, chills, night sweats, weight loss, chest pain, or shortness of breath.   Objective:    General: Well Developed, well nourished, and in no acute distress.  Neuro: Alert and oriented x3, extra-ocular muscles intact, sensation grossly intact.  HEENT: Normocephalic, atraumatic, pupils equal round reactive to light, neck supple, no masses, no lymphadenopathy, thyroid nonpalpable.  Skin: Warm and dry, no rashes. Cardiac: Regular rate and rhythm, no murmurs rubs or gallops, no lower extremity edema.  Respiratory: Clear to auscultation bilaterally. Not using accessory muscles, speaking in full sentences.  Impression and Recommendations:

## 2013-08-20 NOTE — Assessment & Plan Note (Signed)
Has a colonoscopy coming up. Otherwise up-to-date.

## 2013-08-20 NOTE — Assessment & Plan Note (Signed)
We will start a low sodium diet, return in one month, if still elevated we can start a low-dose amlodipine.

## 2013-08-20 NOTE — Assessment & Plan Note (Signed)
Adding a low dose of hydroxyzine. If ineffective we can certain consider buspirone and gabapentin. Return in one month.

## 2013-08-20 NOTE — Patient Instructions (Signed)
We will start with hydroxyzine, we can also consider buspirone or gabapentin if continues to be anxious with hydroxyzine.

## 2013-09-02 ENCOUNTER — Ambulatory Visit (AMBULATORY_SURGERY_CENTER): Payer: Self-pay | Admitting: *Deleted

## 2013-09-02 VITALS — Ht 60.5 in | Wt 119.6 lb

## 2013-09-02 DIAGNOSIS — Z1211 Encounter for screening for malignant neoplasm of colon: Secondary | ICD-10-CM

## 2013-09-02 MED ORDER — NA SULFATE-K SULFATE-MG SULF 17.5-3.13-1.6 GM/177ML PO SOLN: ORAL | Status: DC

## 2013-09-02 NOTE — Progress Notes (Signed)
Patient denies any allergies to eggs or soy. Patient denies any problems with anesthesia/sedation. Patient denies any oxygen use at home and does not take any diet/weight loss medications. EMMI education assisgned to patient on colonoscopy, this was explained and instructions given to patient. 

## 2013-09-14 ENCOUNTER — Encounter: Payer: Self-pay | Admitting: Internal Medicine

## 2013-09-14 ENCOUNTER — Ambulatory Visit (AMBULATORY_SURGERY_CENTER): Payer: BC Managed Care – PPO | Admitting: Internal Medicine

## 2013-09-14 VITALS — BP 122/74 | HR 69 | Temp 97.6°F | Resp 24 | Ht 60.01 in | Wt 119.0 lb

## 2013-09-14 DIAGNOSIS — Z1211 Encounter for screening for malignant neoplasm of colon: Secondary | ICD-10-CM

## 2013-09-14 MED ORDER — SODIUM CHLORIDE 0.9 % IV SOLN: 500.0000 mL | INTRAVENOUS | Status: DC

## 2013-09-14 NOTE — Patient Instructions (Addendum)
No polyps today. You do have a condition called diverticulosis - common and not usually a problem. Please read the handout provided. Small hemorrhoids also.  Next routine colonoscopy in 10 years - 2025  I appreciate the opportunity to care for you. Gatha Mayer, MD, FACG   YOU HAD AN ENDOSCOPIC PROCEDURE TODAY AT [facility]: Refer to the procedure report that was given to you for any specific questions about what was found during the examination.  If the procedure report does not answer your questions, please call your gastroenterologist to clarify.  If you requested that your care partner not be given the details of your procedure findings, then the procedure report has been included in a sealed envelope for you to review at your convenience later.  YOU SHOULD EXPECT: Some feelings of bloating in the abdomen. Passage of more gas than usual.  Walking can help get rid of the air that was put into your GI tract during the procedure and reduce the bloating. If you had a lower endoscopy (such as a colonoscopy or flexible sigmoidoscopy) you may notice spotting of blood in your stool or on the toilet paper. If you underwent a bowel prep for your procedure, then you may not have a normal bowel movement for a few days.  DIET: Your first meal following the procedure should be a light meal and then it is ok to progress to your normal diet.  A half-sandwich or bowl of soup is an example of a good first meal.  Heavy or fried foods are harder to digest and may make you feel nauseous or bloated.  Likewise meals heavy in dairy and vegetables can cause extra gas to form and this can also increase the bloating.  Drink plenty of fluids but you should avoid alcoholic beverages for 24 hours.  ACTIVITY: Your care partner should take you home directly after the procedure.  You should plan to take it easy, moving slowly for the rest of the day.  You can resume normal activity the day after the  procedure however you should NOT DRIVE or use heavy machinery for 24 hours (because of the sedation medicines used during the test).    SYMPTOMS TO REPORT IMMEDIATELY: A gastroenterologist can be reached at any hour.  During normal business hours, 8:30 AM to 5:00 PM Monday through Friday, call [phone].  After hours and on weekends, please call the GI answering service at [phone] who will take a message and have the physician on call contact you.   Following lower endoscopy (colonoscopy or flexible sigmoidoscopy):  Excessive amounts of blood in the stool  Significant tenderness or worsening of abdominal pains  Swelling of the abdomen that is new, acute  Fever of 100F or higher   FOLLOW UP: If any biopsies were taken you will be contacted by phone or by letter within the next 1-3 weeks.  Call your gastroenterologist if you have not heard about the biopsies in 3 weeks.  Our staff will call the home number listed on your records the next business day following your procedure to check on you and address any questions or concerns that you may have at that time regarding the information given to you following your procedure. This is a courtesy call and so if there is no answer at the home number and we have not heard from you through the emergency physician on call, we will assume that you have returned to your regular daily activities without  incident.  SIGNATURES/CONFIDENTIALITY: You and/or your care partner have signed paperwork which will be entered into your electronic medical record.  These signatures attest to the fact that that the information above on your After Visit Summary has been reviewed and is understood.  Full responsibility of the confidentiality of this discharge information lies with you and/or your care-partner.   Handouts were given to your care partner on diverticulosis and hemorrhoids. You may resume your current medications today. Please call if any questions  or concerns.

## 2013-09-14 NOTE — Op Note (Signed)
[facility]  [address] [place], [id number]   COLONOSCOPY PROCEDURE REPORT  PATIENT: Joan, Lopez  MR#: [id number] BIRTHDATE: 1950-06-12 , 81  yrs. old GENDER: Female ENDOSCOPIST: Gatha Mayer, MD, [facility] PROCEDURE DATE:  09/14/2013 PROCEDURE:   Colonoscopy, screening First Screening Colonoscopy - Avg.  risk and is 50 yrs.  old or older - No.  Prior Negative Screening - Now for repeat screening. 10 or more years since last screening  History of Adenoma - Now for follow-up colonoscopy & has been > or = to 3 yrs.  N/A  Polyps Removed Today? No.  Recommend repeat exam, <10 yrs? No. ASA CLASS:   Class II INDICATIONS:average risk screening and Last colonoscopy performed 10 years ago. MEDICATIONS: Propofol (Diprivan) 300 mg IV, MAC sedation, administered by CRNA, and These medications were titrated to patient response per physician's verbal order  DESCRIPTION OF PROCEDURE:   After the risks benefits and alternatives of the procedure were thoroughly explained, informed consent was obtained.  A digital rectal exam revealed no abnormalities of the rectum.   The LB [id number]  endoscope was introduced through the anus and advanced to the cecum, which was identified by both the appendix and ileocecal valve. No adverse events experienced.   The quality of the prep was excellent using Suprep  The instrument was then slowly withdrawn as the colon was fully examined.  COLON FINDINGS: Mild diverticulosis was noted throughout the entire examined colon.   The colon mucosa was otherwise normal.   A right colon retroflexion was performed.  Retroflexed views revealed external hemorrhoids. The time to cecum=3 minutes 18 seconds. Withdrawal time=7 minutes 57 seconds.  The scope was withdrawn and the procedure completed. COMPLICATIONS: There were no complications.  ENDOSCOPIC IMPRESSION: 1.   Mild diverticulosis was noted throughout the entire examined colon 2.   The  colon mucosa was otherwise normal - excellent prep - second screening 3.   External hemorrhoids  RECOMMENDATIONS: Repeat Colonscopy in 10 years -2025   eSigned:  Gatha Mayer, MD, [facility] 09/14/2013 12:02 PM   cc: The Patient

## 2013-09-14 NOTE — Progress Notes (Signed)
No complaints noted in the recovery room. Maw   

## 2013-09-15 ENCOUNTER — Telehealth: Payer: Self-pay | Admitting: *Deleted

## 2013-09-15 NOTE — Telephone Encounter (Signed)
N  Follow up Call-  Call back number 09/14/2013  Post procedure Call Back phone  # [phone] - hm  Permission to leave phone message Yes     Patient questions:  Do you have a fever, pain , or abdominal swelling? no Pain Score  0 *  Have you tolerated food without any problems? yes  Have you been able to return to your normal activities? yes  Do you have any questions about your discharge instructions: Diet   no Medications  no Follow up visit  no  Do you have questions or concerns about your Care? no  Actions: * If pain score is 4 or above: No action needed, pain <4.

## 2013-09-17 ENCOUNTER — Encounter: Payer: Self-pay | Admitting: Sports Medicine

## 2013-09-17 ENCOUNTER — Ambulatory Visit (INDEPENDENT_AMBULATORY_CARE_PROVIDER_SITE_OTHER): Payer: BC Managed Care – PPO | Admitting: Sports Medicine

## 2013-09-17 VITALS — BP 113/70 | HR 80 | Ht 60.6 in | Wt 121.0 lb

## 2013-09-17 DIAGNOSIS — Z299 Encounter for prophylactic measures, unspecified: Secondary | ICD-10-CM

## 2013-09-17 NOTE — Assessment & Plan Note (Signed)
Doing extremely well, blood pressure has returned to normal. Up-to-date on all preventive measures. Return in a year.

## 2013-09-17 NOTE — Progress Notes (Signed)
  Subjective:    CC: Followup  HPI: Elevated blood pressure: Normalized without medication.  Anxiety: Well controlled with hydroxyzine, a whole tablet was too strong.  Past medical history, Surgical history, Family history not pertinant except as noted below, Social history, Allergies, and medications have been entered into the medical record, reviewed, and no changes needed.   Review of Systems: No fevers, chills, night sweats, weight loss, chest pain, or shortness of breath.   Objective:    General: Well Developed, well nourished, and in no acute distress.  Neuro: Alert and oriented x3, extra-ocular muscles intact, sensation grossly intact.  HEENT: Normocephalic, atraumatic, pupils equal round reactive to light, neck supple, no masses, no lymphadenopathy, thyroid nonpalpable.  Skin: Warm and dry, no rashes. Cardiac: Regular rate and rhythm, no murmurs rubs or gallops, no lower extremity edema.  Respiratory: Clear to auscultation bilaterally. Not using accessory muscles, speaking in full sentences.  Impression and Recommendations:

## 2013-10-21 ENCOUNTER — Encounter (HOSPITAL_COMMUNITY): Payer: Self-pay | Admitting: *Deleted

## 2013-10-21 ENCOUNTER — Other Ambulatory Visit: Payer: Self-pay | Admitting: Urology

## 2013-10-22 ENCOUNTER — Encounter (HOSPITAL_COMMUNITY): Payer: BC Managed Care – PPO | Admitting: Anesthesiology

## 2013-10-22 ENCOUNTER — Ambulatory Visit (HOSPITAL_COMMUNITY): Payer: BC Managed Care – PPO | Admitting: Anesthesiology

## 2013-10-22 ENCOUNTER — Encounter (HOSPITAL_COMMUNITY)
Admission: RE | Disposition: A | Discharge status: Home / Self Care | Payer: Self-pay | Source: Ambulatory Visit | Attending: Urology

## 2013-10-22 ENCOUNTER — Ambulatory Visit (HOSPITAL_COMMUNITY): Payer: BC Managed Care – PPO

## 2013-10-22 ENCOUNTER — Ambulatory Visit (HOSPITAL_COMMUNITY)
Admission: RE | Admit: 2013-10-22 | Discharge: 2013-10-22 | Disposition: A | Discharge status: Home / Self Care | Payer: BC Managed Care – PPO | Source: Ambulatory Visit | Attending: Urology | Admitting: Urology

## 2013-10-22 ENCOUNTER — Encounter (HOSPITAL_COMMUNITY): Payer: Self-pay | Admitting: Anesthesiology

## 2013-10-22 DIAGNOSIS — Z0389 Encounter for observation for other suspected diseases and conditions ruled out: Secondary | ICD-10-CM | POA: Insufficient documentation

## 2013-10-22 DIAGNOSIS — Z79899 Other long term (current) drug therapy: Secondary | ICD-10-CM | POA: Insufficient documentation

## 2013-10-22 DIAGNOSIS — M129 Arthropathy, unspecified: Secondary | ICD-10-CM | POA: Insufficient documentation

## 2013-10-22 HISTORY — PX: CYSTOSCOPY WITH RETROGRADE PYELOGRAM, URETEROSCOPY AND STENT PLACEMENT: SHX5789

## 2013-10-22 HISTORY — DX: Calculus of kidney: N20.0

## 2013-10-22 LAB — COMPREHENSIVE METABOLIC PANEL
ALBUMIN: 3.2 g/dL — AB (ref 3.5–5.2)
ALK PHOS: 123 U/L — AB (ref 39–117)
ALT: 54 U/L — AB (ref 0–35)
AST: 42 U/L — AB (ref 0–37)
Anion gap: 11 (ref 5–15)
BILIRUBIN TOTAL: 0.5 mg/dL (ref 0.3–1.2)
BUN: 16 mg/dL (ref 6–23)
CO2: 29 mEq/L (ref 19–32)
Calcium: 9.6 mg/dL (ref 8.4–10.5)
Chloride: 100 mEq/L (ref 96–112)
Creatinine, Ser: 0.68 mg/dL (ref 0.50–1.10)
GFR calc Af Amer: >90 mL/min (ref >90)
GFR calc non Af Amer: >90 mL/min (ref >90)
Glucose, Bld: 118 mg/dL — ABNORMAL HIGH (ref 70–99)
POTASSIUM: 4.4 meq/L (ref 3.7–5.3)
Sodium: 140 mEq/L (ref 137–147)
Total Protein: 7.4 g/dL (ref 6.0–8.3)

## 2013-10-22 LAB — CBC
HCT: 38.4 % (ref 36.0–46.0)
Hemoglobin: 13 g/dL (ref 12.0–15.0)
MCH: 29.5 pg (ref 26.0–34.0)
MCHC: 33.9 g/dL (ref 30.0–36.0)
MCV: 87.3 fL (ref 78.0–100.0)
PLATELETS: 172 10*3/uL (ref 150–400)
RBC: 4.4 MIL/uL (ref 3.87–5.11)
RDW: 12.2 % (ref 11.5–15.5)
WBC: 4.6 10*3/uL (ref 4.0–10.5)

## 2013-10-22 SURGERY — CYSTOURETEROSCOPY, WITH RETROGRADE PYELOGRAM AND STENT INSERTION
Anesthesia: General | Laterality: Right

## 2013-10-22 MED ORDER — PROPOFOL 10 MG/ML IV BOLUS
INTRAVENOUS | Status: DC | PRN
  Administered 2013-10-22: 50 mg via INTRAVENOUS
  Administered 2013-10-22: 90 mg via INTRAVENOUS

## 2013-10-22 MED ORDER — FENTANYL CITRATE 0.05 MG/ML IJ SOLN
INTRAMUSCULAR | Status: AC
  Filled 2013-10-22: qty 2

## 2013-10-22 MED ORDER — MIDAZOLAM HCL 2 MG/2ML IJ SOLN
INTRAMUSCULAR | Status: AC
  Filled 2013-10-22: qty 2

## 2013-10-22 MED ORDER — FENTANYL CITRATE 0.05 MG/ML IJ SOLN
INTRAMUSCULAR | Status: DC | PRN
  Administered 2013-10-22 (×2): 25 ug via INTRAVENOUS

## 2013-10-22 MED ORDER — CIPROFLOXACIN IN D5W 400 MG/200ML IV SOLN
INTRAVENOUS | Status: AC
  Filled 2013-10-22: qty 200

## 2013-10-22 MED ORDER — PROPOFOL 10 MG/ML IV BOLUS
INTRAVENOUS | Status: AC
  Filled 2013-10-22: qty 20

## 2013-10-22 MED ORDER — LIDOCAINE HCL (CARDIAC) 20 MG/ML IV SOLN
INTRAVENOUS | Status: DC | PRN
  Administered 2013-10-22: 50 mg via INTRAVENOUS

## 2013-10-22 MED ORDER — LACTATED RINGERS IV SOLN
INTRAVENOUS | Status: DC
  Administered 2013-10-22: 1000 mL via INTRAVENOUS

## 2013-10-22 MED ORDER — PROMETHAZINE HCL 25 MG/ML IJ SOLN: 6.2500 mg | INTRAMUSCULAR | Status: DC | PRN

## 2013-10-22 MED ORDER — IOHEXOL 300 MG/ML  SOLN
INTRAMUSCULAR | Status: DC | PRN
  Administered 2013-10-22: 2 mL via INTRAVENOUS

## 2013-10-22 MED ORDER — SODIUM CHLORIDE 0.9 % IR SOLN
Status: DC | PRN
  Administered 2013-10-22: 3000 mL

## 2013-10-22 MED ORDER — FENTANYL CITRATE 0.05 MG/ML IJ SOLN: 25.0000 ug | INTRAMUSCULAR | Status: DC | PRN

## 2013-10-22 MED ORDER — CIPROFLOXACIN IN D5W 400 MG/200ML IV SOLN
400.0000 mg | INTRAVENOUS | Status: AC
  Administered 2013-10-22: 400 mg via INTRAVENOUS

## 2013-10-22 SURGICAL SUPPLY — 13 items
BAG URO CATCHER STRL LF (DRAPE) ×2 IMPLANT
BASKET ZERO TIP NITINOL 2.4FR (BASKET) IMPLANT
BSKT STON RTRVL ZERO TP 2.4FR (BASKET)
CATH INTERMIT  6FR 70CM (CATHETERS) ×1 IMPLANT
CLOTH BEACON ORANGE TIMEOUT ST (SAFETY) ×2 IMPLANT
DRAPE CAMERA CLOSED 9X96 (DRAPES) ×2 IMPLANT
GLOVE BIOGEL M STRL SZ7.5 (GLOVE) ×2 IMPLANT
GOWN STRL REUS W/TWL LRG LVL3 (GOWN DISPOSABLE) ×4 IMPLANT
GUIDEWIRE ANG ZIPWIRE 038X150 (WIRE) IMPLANT
GUIDEWIRE STR DUAL SENSOR (WIRE) ×2 IMPLANT
MANIFOLD NEPTUNE II (INSTRUMENTS) ×2 IMPLANT
PACK CYSTO (CUSTOM PROCEDURE TRAY) ×2 IMPLANT
TUBING CONNECTING 10 (TUBING) ×2 IMPLANT

## 2013-10-22 NOTE — Discharge Instructions (Signed)
1. You may see some blood in the urine and may have some burning with urination for 48-72 hours. You also may notice that you have to urinate more frequently or urgently after your procedure which is normal.  °2. You should call should you develop an inability urinate, fever > 101, persistent nausea and vomiting that prevents you from eating or drinking to stay hydrated.  °

## 2013-10-22 NOTE — Op Note (Signed)
Preoperative diagnosis: Right ureteral calculus  Postoperative diagnosis: Passed right ureteral calculus  Procedure:  1. Cystoscopy 2. Right ureteroscopy 3. Right retrograde pyelography with interpretation  Surgeon: Pryor Curia. M.D.  Anesthesia: General  Complications: None  Intraoperative findings: Right retrograde pyelography demonstrated a possible filling defect in the distal right ureter.  There was a single ureter up to a point about 4-5 cm above the ureteral orifice where a duplicated ureter was noted as seen on her prior CT scan.  EBL: Minimal  Indication: Joan Lopez is a 63 y.o. year old patient with urolithiasis who was recently found to have a distal right ureteral stone on her CT scan. After reviewing the management options for treatment, the patient elected to proceed with the above surgical procedure(s). We have discussed the potential benefits and risks of the procedure, side effects of the proposed treatment, the likelihood of the patient achieving the goals of the procedure, and any potential problems that might occur during the procedure or recuperation. Informed consent has been obtained.  Description of procedure:  The patient was taken to the operating room and general anesthesia was induced.  The patient was placed in the dorsal lithotomy position, prepped and draped in the usual sterile fashion, and preoperative antibiotics were administered. A preoperative time-out was performed.   Cystourethroscopy was performed.  The patient's urethra was examined and was normal. The bladder was then systematically examined in its entirety. There was no evidence for any bladder tumors, stones, or other mucosal pathology.    Attention then turned to the right ureteral orifice and a ureteral catheter was used to intubate the ureteral orifice.  Omnipaque contrast was injected through the ureteral catheter and a retrograde pyelogram was performed with findings as  dictated above.  A 0.38 sensor guidewire was then advanced up the lower pole right ureter into the renal pelvis under fluoroscopic guidance. The 4 Fr semirigid ureteroscope was then advanced into the ureter next to the guidewire.  No stone was identified in the distal ureter.  I then advanced the ureteroscope up to the renal pelvis in both the upper pole and lower pole ureters with no stone identified indicating that she had likely passed her stone.  The safety wire was removed.  The bladder was then emptied and the procedure ended.  The patient appeared to tolerate the procedure well and without complications.  The patient was able to be awakened and transferred to the recovery unit in satisfactory condition.

## 2013-10-22 NOTE — Anesthesia Postprocedure Evaluation (Signed)
  Anesthesia Post-op Note  Patient: Joan Lopez  Procedure(s) Performed: Procedure(s) (LRB): CYSTOSCOPY WITH RETROGRADE PYELOGRAM, URETEROSCOPY  (Right) HOLMIUM LASER APPLICATION (Right)  Patient Location: PACU  Anesthesia Type: General  Level of Consciousness: awake and alert   Airway and Oxygen Therapy: Patient Spontanous Breathing  Post-op Pain: mild  Post-op Assessment: Post-op Vital signs reviewed, Patient's Cardiovascular Status Stable, Respiratory Function Stable, Patent Airway and No signs of Nausea or vomiting  Last Vitals:  Filed Vitals:   10/22/13 1155  BP: 117/69  Pulse: 73  Temp: 36.7 C  Resp: 17    Post-op Vital Signs: stable   Complications: No apparent anesthesia complications

## 2013-10-22 NOTE — H&P (Signed)
History of Present Illness Joan Lopez is a 63 year old with the following urologic history:    1) Microscopic hematuria / complex right renal cyst: She was initially seen in December 2009 for microscopic hematuria which was not persistent. She denies a history of tobacco use or family history of GU malignancy. She again presented in April 2011 with new gross hematuria. She underwent a full urologic evaluation and was found to have a complex hemorrhagic cyst near the collecting system of the right kidney. She was also noted to have an association with exercise (running) indicating benign exercised-induced hematuria as a possible explanation as well.   Dec 2009: NMP-22 negative   Apr 2011: CT, cysto, cytology - negative    Interval history:    She presents today after having seen Dr. Karsten Ro last week when she developed the acute severe onset of right-sided flank pain that awoke her from sleep. She presented to the emergency department in [place] was found to have a 2 mm distal right ureteral calculus. She was also noted to have some perinephric fluid possibly related to a forniceal rupture. Her pain was subsequently controlled and she did elect to try to pass her stone which would appear to have been very appropriate treatment. She has denied any fever, persistent nausea or vomiting, or severe pain episodes since then. She has been straining her urine and has not noted any passage of the stone. She has been taking tamsulosin. She describes some persistent soreness in her right flank but no severe pain.   Past Medical History Problems  1. History of Arthritis (V13.4) 2. History of heartburn (V12.79)  Surgical History Problems  1. History of Appendectomy 2. History of Cesarean Section 3. History of Gallbladder Surgery 4. History of Hysterectomy 5. History of Tonsillectomy 6. History of Tubal Ligation  Current Meds 1. Lotemax 0.5 % Ophthalmic Suspension;  Therapy:  (Recorded:02Jul2015) to Recorded 2. Ondansetron 4 MG Oral Tablet Dispersible;  Therapy: (Recorded:02Jul2015) to Recorded 3. OxyCODONE HCl - 5 MG Oral Capsule;  Therapy: (Recorded:02Jul2015) to Recorded 4. Tamsulosin HCl - 0.4 MG Oral Capsule;  Therapy: (Recorded:02Jul2015) to Recorded  Allergies Medication  1. No Known Drug Allergies  Family History Problems  1. Family history of End stage renal disease (585.6) : Father   He had a history of an ileal conduit urinary diversion apparently as a result from a     bladder injury during World War II. 2. Family history of Nephrolithiasis  Social History Problems  1. Denied: History of Alcohol Use 2. Marital History - Currently Married 3. Never A Smoker 4. Occupation:   retired Chief Executive Officer 5. Denied: History of Tobacco Use  Vitals Vital Signs [Data Includes: Last 1 Day]  Recorded: [id number] 02:57PM  Height: 5 ft 0.5 in Weight: 120 lb  BMI Calculated: 23.05 BSA Calculated: 1.51 Blood Pressure: 123 / 79 Heart Rate: 92  Physical Exam Constitutional: Well nourished and well developed . No acute distress.  ENT:. The ears and nose are normal in appearance.  Neck: The appearance of the neck is normal and no neck mass is present.  Pulmonary: No respiratory distress and normal respiratory rhythm and effort.  Cardiovascular:. No peripheral edema.  Abdomen: The abdomen is soft and nontender. No CVA tenderness.  Lymphatics: The femoral and inguinal nodes are not enlarged or tender.  Skin: Normal skin turgor, no visible rash and no visible skin lesions.  Neuro/Psych:. Mood and affect are appropriate.    Results/Data Urine [Data Includes: Last 1 Day]  [id number]  COLOR YELLOW   APPEARANCE CLEAR   SPECIFIC GRAVITY 1.025   pH 5.5   GLUCOSE NEG mg/dL  BILIRUBIN SMALL   KETONE NEG mg/dL  BLOOD LARGE   PROTEIN 30 mg/dL  UROBILINOGEN 0.2 mg/dL  NITRITE NEG   LEUKOCYTE ESTERASE TRACE   SQUAMOUS EPITHELIAL/HPF MODERATE   WBC 3-6  WBC/hpf  RBC 3-6 RBC/hpf  BACTERIA FEW   CRYSTALS NONE SEEN   CASTS NONE SEEN    I independently reviewed following studies today:    1. KUB x-ray: There is no calcification noted in the vicinity of the distal right ureter that I can appreciate. She does have a small calcification noted in the left lower pelvis possibly consistent with a phlebolith. No large calcifications are noted in the vicinity of the renal contours or the expected course of the ureters bilaterally.    2. Right renal ultrasound: Right kidney measures 12.7 x 5.9 x 3.9 cm. There is mild to moderate fullness of the renal pelvis. She does have a stable complex cystic lesion measuring 1.5 x 1.3 cm as previously noted on her prior evaluations. There is a hypoechoic area with echo surrounding the kidney and the perinephric space likely consistent with perinephric fluid related to what appears to be a forniceal rupture last week.   Assessment Assessed  1. Ureteral calculus, right (592.1)  Plan Health Maintenance  1. UA With REFLEX; [Do Not Release]; Status:Complete;   Done: [id number] 02:47PM Ureteral calculus, right  2. Follow-up Office  Follow-up - will call to schedule surgery  Status: Hold For - Date of  Service  Requested for: 07Jul2015  Discussion/Summary 1. Right ureteral calculus: Considering her evaluation today and the fact that she has not noted passing a stone, she would appear to still have a distal right ureteral calculus. We have discussed options for ongoing management including continued medical expulsion therapy versus definitive surgical therapy. We discussed absolute indications for more urgent treatment. Unfortunately, she her husband are planning on a long trip next week and she wishes to have this issue resolved well before that trip to avoid any complications while on her trip and out of town. After discussing options, she has elected to proceed with right ureteroscopic stone removal. The potential  risks, complications, and expected recovery process were discussed in detail. She gives her informed consent to proceed. This will be scheduled for later this week.    Cc: Dr. Loyal Gambler     Signatures Electronically signed by : Raynelle Bring, M.D.; Oct 20 2013  6:51PM EST

## 2013-10-22 NOTE — Anesthesia Preprocedure Evaluation (Signed)
Anesthesia Evaluation  Patient identified by MRN, date of birth, ID band Patient awake  General Assessment Comment:Fuch's endothelial dystrophy Thrombocytopenia Renal insufficiency Abnormal LFTs  Reviewed: Allergy & Precautions, H&P , NPO status , Patient's Chart, lab work & pertinent test results  Airway Mallampati: II TM Distance: >3 FB Neck ROM: Full    Dental no notable dental hx.    Pulmonary neg pulmonary ROS,  breath sounds clear to auscultation  Pulmonary exam normal       Cardiovascular negative cardio ROS  Rhythm:Regular Rate:Normal     Neuro/Psych PSYCHIATRIC DISORDERS  Neuromuscular disease    GI/Hepatic negative GI ROS, Neg liver ROS,   Endo/Other  negative endocrine ROS  Renal/GU Renal InsufficiencyRenal disease  negative genitourinary   Musculoskeletal negative musculoskeletal ROS (+)   Abdominal   Peds negative pediatric ROS (+)  Hematology negative hematology ROS (+)   Anesthesia Other Findings   Reproductive/Obstetrics negative OB ROS                           Anesthesia Physical Anesthesia Plan  ASA: III  Anesthesia Plan: General   Post-op Pain Management:    Induction: Intravenous  Airway Management Planned: LMA  Additional Equipment:   Intra-op Plan:   Post-operative Plan: Extubation in OR  Informed Consent: I have reviewed the patients History and Physical, chart, labs and discussed the procedure including the risks, benefits and alternatives for the proposed anesthesia with the patient or authorized representative who has indicated his/her understanding and acceptance.   Dental advisory given  Plan Discussed with: CRNA  Anesthesia Plan Comments:         Anesthesia Quick Evaluation

## 2013-10-22 NOTE — Transfer of Care (Signed)
Immediate Anesthesia Transfer of Care Note  Patient: Joan Lopez  Procedure(s) Performed: Procedure(s) with comments: CYSTOSCOPY WITH RETROGRADE PYELOGRAM, URETEROSCOPY  (Right) - 1 hour requested for this case  **OR ROOM #8 REQUESTED**  POSSIBLE RIGHT URETERAL STENT  C-ARM  [phone] HOME - BEST CONTACT # [phone] CELL   [employer] ID # [id number] [name] GRP # [id number] HOLMIUM LASER APPLICATION (Right)  Patient Location: PACU  Anesthesia Type:General  Level of Consciousness: awake, alert  and oriented  Airway & Oxygen Therapy: Patient Spontanous Breathing and Patient connected to face mask oxygen  Post-op Assessment: Report given to PACU RN and Post -op Vital signs reviewed and stable  Post vital signs: Reviewed and stable  Complications: No apparent anesthesia complications

## 2013-10-23 ENCOUNTER — Encounter (HOSPITAL_COMMUNITY): Payer: Self-pay | Admitting: Urology

## 2014-04-02 DIAGNOSIS — Z947 Corneal transplant status: Secondary | ICD-10-CM | POA: Diagnosis not present

## 2014-04-02 DIAGNOSIS — H18232 Secondary corneal edema, left eye: Secondary | ICD-10-CM | POA: Diagnosis not present

## 2014-04-02 DIAGNOSIS — H1851 Endothelial corneal dystrophy: Secondary | ICD-10-CM | POA: Diagnosis not present

## 2014-04-20 DIAGNOSIS — Z947 Corneal transplant status: Secondary | ICD-10-CM | POA: Diagnosis not present

## 2014-04-20 DIAGNOSIS — H18232 Secondary corneal edema, left eye: Secondary | ICD-10-CM | POA: Diagnosis not present

## 2014-05-17 ENCOUNTER — Ambulatory Visit (INDEPENDENT_AMBULATORY_CARE_PROVIDER_SITE_OTHER): Payer: Medicare Other | Admitting: Sports Medicine

## 2014-05-17 ENCOUNTER — Encounter: Payer: Self-pay | Admitting: Sports Medicine

## 2014-05-17 ENCOUNTER — Ambulatory Visit (INDEPENDENT_AMBULATORY_CARE_PROVIDER_SITE_OTHER): Payer: Medicare Other

## 2014-05-17 VITALS — BP 129/78 | HR 69 | Ht 61.5 in | Wt 125.0 lb

## 2014-05-17 DIAGNOSIS — M170 Bilateral primary osteoarthritis of knee: Secondary | ICD-10-CM | POA: Insufficient documentation

## 2014-05-17 DIAGNOSIS — M228X1 Other disorders of patella, right knee: Secondary | ICD-10-CM

## 2014-05-17 DIAGNOSIS — M1711 Unilateral primary osteoarthritis, right knee: Secondary | ICD-10-CM | POA: Diagnosis not present

## 2014-05-17 DIAGNOSIS — M25762 Osteophyte, left knee: Secondary | ICD-10-CM

## 2014-05-17 DIAGNOSIS — Z13820 Encounter for screening for osteoporosis: Secondary | ICD-10-CM

## 2014-05-17 DIAGNOSIS — M2240 Chondromalacia patellae, unspecified knee: Secondary | ICD-10-CM

## 2014-05-17 DIAGNOSIS — Z0000 Encounter for general adult medical examination without abnormal findings: Secondary | ICD-10-CM

## 2014-05-17 DIAGNOSIS — M25561 Pain in right knee: Secondary | ICD-10-CM | POA: Diagnosis not present

## 2014-05-17 DIAGNOSIS — M7989 Other specified soft tissue disorders: Secondary | ICD-10-CM | POA: Diagnosis not present

## 2014-05-17 DIAGNOSIS — M25761 Osteophyte, right knee: Secondary | ICD-10-CM

## 2014-05-17 DIAGNOSIS — S8991XA Unspecified injury of right lower leg, initial encounter: Secondary | ICD-10-CM | POA: Diagnosis not present

## 2014-05-17 DIAGNOSIS — M228X2 Other disorders of patella, left knee: Secondary | ICD-10-CM

## 2014-05-17 DIAGNOSIS — M1712 Unilateral primary osteoarthritis, left knee: Secondary | ICD-10-CM | POA: Diagnosis not present

## 2014-05-17 MED ORDER — MELOXICAM 15 MG PO TABS: ORAL_TABLET | ORAL | Status: DC

## 2014-05-17 NOTE — Progress Notes (Signed)
  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 No mechanical symptoms. Moderate, improving.  Past medical history, Surgical history, Family history not pertinant except as noted below, Social history, Allergies, and medications have been entered into the medical record, reviewed, and no changes needed.   Review of Systems: No fevers, chills, night sweats, weight loss, chest pain, or shortness of breath.   Objective:    General: Well Developed, well nourished, and in no acute distress.  Neuro: Alert and oriented x3, extra-ocular muscles intact, sensation grossly intact.  HEENT: Normocephalic, atraumatic, pupils equal round reactive to light, neck supple, no masses, no lymphadenopathy, thyroid nonpalpable.  Skin: Warm and dry, no rashes. Cardiac: Regular rate and rhythm, no murmurs rubs or gallops, no lower extremity edema.  Respiratory: Clear to auscultation bilaterally. Not using accessory muscles, speaking in full sentences. Bilateral Knee: Visible and palpable effusion bilaterally worse on the right side, palpable tenderness under the medial and lateral patellar facets. ROM normal in flexion and extension and lower leg rotation. Ligaments with solid consistent endpoints including ACL, PCL, LCL, MCL. Negative Mcmurray's and provocative meniscal tests. Non painful patellar compression. Patellar and quadriceps tendons unremarkable. Hamstring and quadriceps strength is normal.  Impression and Recommendations:

## 2014-05-17 NOTE — Assessment & Plan Note (Signed)
Declines influenza or pneumonia vaccination. Ordering a DEXA scan.

## 2014-05-17 NOTE — Assessment & Plan Note (Signed)
Bilateral, right worse than left with effusion. X-rays, meloxicam, home rehabilitation exercises. Restart training for the [employer] in about 2 weeks, the marathon is in about 3 months. Return to see me in one month, aspiration and injection if no better.

## 2014-05-19 ENCOUNTER — Ambulatory Visit (INDEPENDENT_AMBULATORY_CARE_PROVIDER_SITE_OTHER): Payer: Medicare Other

## 2014-05-19 DIAGNOSIS — M8589 Other specified disorders of bone density and structure, multiple sites: Secondary | ICD-10-CM | POA: Diagnosis not present

## 2014-05-19 DIAGNOSIS — M859 Disorder of bone density and structure, unspecified: Secondary | ICD-10-CM | POA: Diagnosis not present

## 2014-05-19 DIAGNOSIS — Z780 Asymptomatic menopausal state: Secondary | ICD-10-CM | POA: Diagnosis not present

## 2014-05-21 DIAGNOSIS — H18232 Secondary corneal edema, left eye: Secondary | ICD-10-CM | POA: Diagnosis not present

## 2014-05-21 DIAGNOSIS — H1851 Endothelial corneal dystrophy: Secondary | ICD-10-CM | POA: Diagnosis not present

## 2014-05-21 DIAGNOSIS — Z947 Corneal transplant status: Secondary | ICD-10-CM | POA: Diagnosis not present

## 2014-05-21 DIAGNOSIS — T86840 Corneal transplant rejection: Secondary | ICD-10-CM | POA: Diagnosis not present

## 2014-06-11 ENCOUNTER — Other Ambulatory Visit: Payer: Self-pay

## 2014-06-11 DIAGNOSIS — Z1231 Encounter for screening mammogram for malignant neoplasm of breast: Secondary | ICD-10-CM

## 2014-06-14 ENCOUNTER — Encounter: Payer: Self-pay | Admitting: Sports Medicine

## 2014-06-14 ENCOUNTER — Ambulatory Visit (INDEPENDENT_AMBULATORY_CARE_PROVIDER_SITE_OTHER): Payer: Medicare Other | Admitting: Sports Medicine

## 2014-06-14 VITALS — BP 145/89 | HR 75 | Wt 126.0 lb

## 2014-06-14 DIAGNOSIS — M2242 Chondromalacia patellae, left knee: Secondary | ICD-10-CM

## 2014-06-14 DIAGNOSIS — K148 Other diseases of tongue: Secondary | ICD-10-CM

## 2014-06-14 NOTE — Assessment & Plan Note (Signed)
There is a subcentimeter dome shaped fleshy lesion on the right side of the tongue, it appears to be caused from repetitive trauma, versus a mucocele versus a neuroma, however it is nontender. We will keep an eye on this over the next 6 months, we did take a picture of it with her phone. She is seeing her dentist coming up, and we'll ask them as well.

## 2014-06-14 NOTE — Assessment & Plan Note (Signed)
Improved significantly after the fall, has been doing rehabilitation exercises. She does have her [place] marathon coming up in April, and seems to be well trained.

## 2014-06-14 NOTE — Progress Notes (Signed)
  Subjective:    CC: Follow-up  HPI:  Knee pain: After a fall while training for the [facility], she did have an effusion, this has since resolved with patellofemoral rehabilitation exercises, and oral meloxicam.  Tongue lesion: Present possibly since the end of last year, nonpainful, not sure whether or not she bites the affected area.   Past medical history, Surgical history, Family history not pertinant except as noted below, Social history, Allergies, and medications have been entered into the medical record, reviewed, and no changes needed.   Review of Systems: No fevers, chills, night sweats, weight loss, chest pain, or shortness of breath.   Objective:    General: Well Developed, well nourished, and in no acute distress.  Neuro: Alert and oriented x3, extra-ocular muscles intact, sensation grossly intact.  HEENT: Normocephalic, atraumatic, pupils equal round reactive to light, neck supple, no masses, no lymphadenopathy, thyroid nonpalpable. Oropharynx unremarkable with the exception of a subcentimeter dome shaped fleshy lesion on the right side of her tongue.  Skin: Warm and dry, no rashes. Cardiac: Regular rate and rhythm, no murmurs rubs or gallops, no lower extremity edema.  Respiratory: Clear to auscultation bilaterally. Not using accessory muscles, speaking in full sentences.   Impression and Recommendations:

## 2014-07-09 DIAGNOSIS — Z947 Corneal transplant status: Secondary | ICD-10-CM | POA: Diagnosis not present

## 2014-07-09 DIAGNOSIS — H1851 Endothelial corneal dystrophy: Secondary | ICD-10-CM | POA: Diagnosis not present

## 2014-07-09 DIAGNOSIS — T86840 Corneal transplant rejection: Secondary | ICD-10-CM | POA: Diagnosis not present

## 2014-07-23 ENCOUNTER — Ambulatory Visit
Admission: RE | Admit: 2014-07-23 | Discharge: 2014-07-23 | Disposition: A | Discharge status: Home / Self Care | Payer: Medicare Other | Source: Ambulatory Visit

## 2014-07-23 DIAGNOSIS — Z1231 Encounter for screening mammogram for malignant neoplasm of breast: Secondary | ICD-10-CM

## 2014-10-11 ENCOUNTER — Other Ambulatory Visit: Payer: Self-pay

## 2014-11-02 DIAGNOSIS — T86840 Corneal transplant rejection: Secondary | ICD-10-CM | POA: Diagnosis not present

## 2014-11-02 DIAGNOSIS — H1851 Endothelial corneal dystrophy: Secondary | ICD-10-CM | POA: Diagnosis not present

## 2014-11-02 DIAGNOSIS — H18232 Secondary corneal edema, left eye: Secondary | ICD-10-CM | POA: Diagnosis not present

## 2014-11-02 DIAGNOSIS — Z947 Corneal transplant status: Secondary | ICD-10-CM | POA: Diagnosis not present

## 2014-12-14 ENCOUNTER — Ambulatory Visit (INDEPENDENT_AMBULATORY_CARE_PROVIDER_SITE_OTHER): Payer: Medicare Other

## 2014-12-14 ENCOUNTER — Ambulatory Visit (INDEPENDENT_AMBULATORY_CARE_PROVIDER_SITE_OTHER): Payer: Medicare Other | Admitting: Sports Medicine

## 2014-12-14 ENCOUNTER — Encounter: Payer: Self-pay | Admitting: Sports Medicine

## 2014-12-14 VITALS — BP 117/68 | HR 83 | Ht 60.0 in | Wt 120.0 lb

## 2014-12-14 DIAGNOSIS — L918 Other hypertrophic disorders of the skin: Secondary | ICD-10-CM

## 2014-12-14 DIAGNOSIS — M67911 Unspecified disorder of synovium and tendon, right shoulder: Secondary | ICD-10-CM | POA: Insufficient documentation

## 2014-12-14 DIAGNOSIS — M79601 Pain in right arm: Secondary | ICD-10-CM

## 2014-12-14 DIAGNOSIS — M5032 Other cervical disc degeneration, mid-cervical region: Secondary | ICD-10-CM | POA: Diagnosis not present

## 2014-12-14 DIAGNOSIS — M19011 Primary osteoarthritis, right shoulder: Secondary | ICD-10-CM | POA: Diagnosis not present

## 2014-12-14 NOTE — Assessment & Plan Note (Signed)
Right arm, right face, cryotherapy.

## 2014-12-14 NOTE — Assessment & Plan Note (Signed)
Differential includes rotator cuff dysfunction and right C5 radiculopathy. She does have x-rays from 6 years ago that show significant cervical degenerative disc disease. X-rays of the neck, shoulder, formal physical therapy for the neck and shoulder, she will be very cognizant over the next several weeks as to what precipitates the pain.

## 2014-12-14 NOTE — Progress Notes (Addendum)
  Subjective:    CC: right arm pain  HPI: For the past few months this pleasant 64 year old female has had an achy pain that she localizes in the right upper arm, she is unaware of any precipitating or palliating factors, and she is unable to reproduce the pain. Denies any numbness or tingling into the forearm or hands. Neck feels okay, symptoms are moderate, persistent.  She also has a couple of skin tags that she would like removed.  Past medical history, Surgical history, Family history not pertinant except as noted below, Social history, Allergies, and medications have been entered into the medical record, reviewed, and no changes needed.   Review of Systems: No fevers, chills, night sweats, weight loss, chest pain, or shortness of breath.   Objective:    General: Well Developed, well nourished, and in no acute distress.  Neuro: Alert and oriented x3, extra-ocular muscles intact, sensation grossly intact.  HEENT: Normocephalic, atraumatic, pupils equal round reactive to light, neck supple, no masses, no lymphadenopathy, thyroid nonpalpable.  Skin: Warm and dry, no rashes. Cardiac: Regular rate and rhythm, no murmurs rubs or gallops, no lower extremity edema.  Respiratory: Clear to auscultation bilaterally. Not using accessory muscles, speaking in full sentences. Neck: Negative spurling's Full neck range of motion Grip strength and sensation normal in bilateral hands Strength good C4 to T1 distribution No sensory change to C4 to T1 Reflexes normal Right Shoulder: Inspection reveals no abnormalities, atrophy or asymmetry. Palpation is normal with no tenderness over AC joint or bicipital groove. ROM is full in all planes. Rotator cuff strength weak to abduction and external rotation. positive Neer and Hawkin's tests, empty can. Speeds and Yergason's tests normal. No labral pathology noted with negative Obrien's, negative crank, negative clunk, and good stability. Normal scapular  function observed. No painful arc and no drop arm sign. No apprehension sign  Procedure:  Cryodestruction of right face and right forearm skin tags Consent obtained and verified. Time-out conducted. Noted no overlying erythema, induration, or other signs of local infection. Completed without difficulty using Cryo-Gun. Advised to call if fevers/chills, erythema, induration, drainage, or persistent bleeding.  Impression and Recommendations:    I spent 40 minutes with this patient, greater than 50% was face-to-face time counseling regarding the above diagnoses

## 2014-12-15 DIAGNOSIS — M79621 Pain in right upper arm: Secondary | ICD-10-CM | POA: Diagnosis not present

## 2014-12-29 DIAGNOSIS — M79621 Pain in right upper arm: Secondary | ICD-10-CM | POA: Diagnosis not present

## 2015-01-11 ENCOUNTER — Encounter: Payer: Self-pay | Admitting: Sports Medicine

## 2015-01-11 ENCOUNTER — Ambulatory Visit (INDEPENDENT_AMBULATORY_CARE_PROVIDER_SITE_OTHER): Payer: Medicare Other | Admitting: Sports Medicine

## 2015-01-11 VITALS — BP 128/79 | HR 73 | Wt 115.0 lb

## 2015-01-11 DIAGNOSIS — M75101 Unspecified rotator cuff tear or rupture of right shoulder, not specified as traumatic: Secondary | ICD-10-CM

## 2015-01-11 DIAGNOSIS — M67911 Unspecified disorder of synovium and tendon, right shoulder: Secondary | ICD-10-CM

## 2015-01-11 NOTE — Assessment & Plan Note (Signed)
Overall improved with physical therapy. Not yet ready to consider interventional treatment. If still hurting after another month we will inject the subacromial bursa

## 2015-01-11 NOTE — Progress Notes (Signed)
  Subjective:    CC: follow-up  HPI: Right shoulder pain: Continues to improve with rotator cuff rehabilitation exercises.  Skin tags: Well healed after cryotherapy.  Past medical history, Surgical history, Family history not pertinant except as noted below, Social history, Allergies, and medications have been entered into the medical record, reviewed, and no changes needed.   Review of Systems: No fevers, chills, night sweats, weight loss, chest pain, or shortness of breath.   Objective:    General: Well Developed, well nourished, and in no acute distress.  Neuro: Alert and oriented x3, extra-ocular muscles intact, sensation grossly intact.  HEENT: Normocephalic, atraumatic, pupils equal round reactive to light, neck supple, no masses, no lymphadenopathy, thyroid nonpalpable.  Skin: Warm and dry, no rashes. Cardiac: Regular rate and rhythm, no murmurs rubs or gallops, no lower extremity edema.  Respiratory: Clear to auscultation bilaterally. Not using accessory muscles, speaking in full sentences. Right Shoulder: Inspection reveals no abnormalities, atrophy or asymmetry. Palpation is normal with no tenderness over AC joint or bicipital groove. ROM is full in all planes. Rotator cuff strength normal throughout. Only mildly positive Neer and Hawkin's tests, empty can. Speeds and Yergason's tests normal. No labral pathology noted with negative Obrien's, negative crank, negative clunk, and good stability. Normal scapular function observed. No painful arc and no drop arm sign. No apprehension sign  Impression and Recommendations:

## 2015-01-12 DIAGNOSIS — M79621 Pain in right upper arm: Secondary | ICD-10-CM | POA: Diagnosis not present

## 2015-03-01 ENCOUNTER — Ambulatory Visit (INDEPENDENT_AMBULATORY_CARE_PROVIDER_SITE_OTHER): Payer: Medicare Other | Admitting: Sports Medicine

## 2015-03-01 VITALS — BP 105/68 | HR 79 | Temp 98.0°F | Resp 18 | Wt 110.0 lb

## 2015-03-01 DIAGNOSIS — Z7182 Exercise counseling: Secondary | ICD-10-CM | POA: Insufficient documentation

## 2015-03-01 DIAGNOSIS — R300 Dysuria: Secondary | ICD-10-CM

## 2015-03-01 DIAGNOSIS — Z7189 Other specified counseling: Secondary | ICD-10-CM

## 2015-03-01 LAB — POCT URINALYSIS DIPSTICK
Bilirubin, UA: NEGATIVE
Blood, UA: NEGATIVE
Glucose, UA: NEGATIVE
Ketones, UA: NEGATIVE
Leukocytes, UA: NEGATIVE
Nitrite, UA: NEGATIVE
Protein, UA: NEGATIVE
Spec Grav, UA: 1.015
Urobilinogen, UA: 0.2
pH, UA: 6

## 2015-03-01 NOTE — Patient Instructions (Addendum)
Monday, 03/07/2015  [address] [place], [place]

## 2015-03-01 NOTE — Progress Notes (Signed)
  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 She has never had chest pain with running, and does extremely well with long distance runs.  She tells me she also had an episode of panic when finding out the results of the recent election. This self resolved and she does not need any medication for this.  Past medical history, Surgical history, Family history not pertinant except as noted below, Social history, Allergies, and medications have been entered into the medical record, reviewed, and no changes needed.   Review of Systems: No fevers, chills, night sweats, weight loss, chest pain, or shortness of breath.   Objective:    General: Well Developed, well nourished, and in no acute distress.  Neuro: Alert and oriented x3, extra-ocular muscles intact, sensation grossly intact.  HEENT: Normocephalic, atraumatic, pupils equal round reactive to light, neck supple, no masses, no lymphadenopathy, thyroid nonpalpable.  Skin: Warm and dry, no rashes. Cardiac: Regular rate and rhythm, no murmurs rubs or gallops, no lower extremity edema.  Respiratory: Clear to auscultation bilaterally. Not using accessory muscles, speaking in full sentences.  Impression and Recommendations:    I spent 40 minutes with this patient, greater than 50% was face-to-face time counseling regarding the above diagnoses

## 2015-03-01 NOTE — Assessment & Plan Note (Signed)
Desires to run a marathon in [place], [place]. She does need urinalysis, ECG, spirometry, and treadmill stress testing before she can be cleared and [place] to participate in competitive distance running. ECG and urinalysis today, she will return in a nurse visit for spirometry, without bronchodilator. I would also like her to see our cardiologist downstairs tomorrow to set up a stress test.

## 2015-03-03 NOTE — Addendum Note (Signed)
Addended by: Huel Cote on: 03/03/2015 03:40 PM   Modules accepted: Orders

## 2015-03-04 ENCOUNTER — Ambulatory Visit (INDEPENDENT_AMBULATORY_CARE_PROVIDER_SITE_OTHER): Payer: Medicare Other | Admitting: Sports Medicine

## 2015-03-04 VITALS — BP 122/75 | HR 64 | Resp 16 | Wt 110.0 lb

## 2015-03-04 DIAGNOSIS — Z0000 Encounter for general adult medical examination without abnormal findings: Secondary | ICD-10-CM

## 2015-03-04 DIAGNOSIS — Z7182 Exercise counseling: Secondary | ICD-10-CM

## 2015-03-04 DIAGNOSIS — Z7189 Other specified counseling: Secondary | ICD-10-CM | POA: Diagnosis not present

## 2015-03-04 NOTE — Progress Notes (Signed)
   Subjective:    Patient ID: Joan Lopez, female    DOB: Apr 08, 1951, 64 y.o.   MRN: [id number]  HPIPatient here for pre-marathon required spirometry.    Review of Systems     Objective:   Physical Exam        Assessment & Plan:  Patient tolerated testing without problems.

## 2015-03-15 DIAGNOSIS — H1851 Endothelial corneal dystrophy: Secondary | ICD-10-CM | POA: Diagnosis not present

## 2015-03-15 DIAGNOSIS — T86840 Corneal transplant rejection: Secondary | ICD-10-CM | POA: Diagnosis not present

## 2015-03-17 ENCOUNTER — Ambulatory Visit (INDEPENDENT_AMBULATORY_CARE_PROVIDER_SITE_OTHER): Payer: Medicare Other

## 2015-03-17 ENCOUNTER — Encounter: Payer: Medicare Other | Admitting: Cardiology

## 2015-03-17 DIAGNOSIS — Z7182 Exercise counseling: Secondary | ICD-10-CM

## 2015-03-17 DIAGNOSIS — Z7189 Other specified counseling: Secondary | ICD-10-CM

## 2015-03-17 LAB — EXERCISE TOLERANCE TEST
Estimated workload: 16.2 METS
Exercise duration (min): 13 min
Exercise duration (sec): 30 s
MPHR: 155 {beats}/min
Peak HR: 164 {beats}/min
Percent HR: 105 %
RPE: 15
Rest HR: 75 {beats}/min

## 2015-05-31 DIAGNOSIS — Z0000 Encounter for general adult medical examination without abnormal findings: Secondary | ICD-10-CM | POA: Diagnosis not present

## 2015-05-31 DIAGNOSIS — R310 Gross hematuria: Secondary | ICD-10-CM | POA: Diagnosis not present

## 2015-06-07 ENCOUNTER — Ambulatory Visit (INDEPENDENT_AMBULATORY_CARE_PROVIDER_SITE_OTHER): Payer: Medicare Other | Admitting: Sports Medicine

## 2015-06-07 ENCOUNTER — Encounter: Payer: Self-pay | Admitting: Sports Medicine

## 2015-06-07 VITALS — BP 147/88 | HR 90 | Resp 18 | Wt 110.2 lb

## 2015-06-07 DIAGNOSIS — M170 Bilateral primary osteoarthritis of knee: Secondary | ICD-10-CM | POA: Diagnosis not present

## 2015-06-07 DIAGNOSIS — N200 Calculus of kidney: Secondary | ICD-10-CM | POA: Diagnosis not present

## 2015-06-07 DIAGNOSIS — M2240 Chondromalacia patellae, unspecified knee: Secondary | ICD-10-CM | POA: Diagnosis not present

## 2015-06-07 DIAGNOSIS — R310 Gross hematuria: Secondary | ICD-10-CM | POA: Diagnosis not present

## 2015-06-07 MED ORDER — MELOXICAM 15 MG PO TABS: ORAL_TABLET | ORAL | Status: DC

## 2015-06-07 NOTE — Progress Notes (Signed)
  Subjective:    CC: Follow-up  HPI: Primary osteoarthritis of both knees: Left knee responded well to rehabilitation exercises and meloxicam, this was over one year ago, now starting to have a recurrence of pain but this time on the right side with mild swelling, pain at the lateral joint line, no mechanical symptoms, moderate, persistent.  Past medical history, Surgical history, Family history not pertinant except as noted below, Social history, Allergies, and medications have been entered into the medical record, reviewed, and no changes needed.   Review of Systems: No fevers, chills, night sweats, weight loss, chest pain, or shortness of breath.   Objective:    General: Well Developed, well nourished, and in no acute distress.  Neuro: Alert and oriented x3, extra-ocular muscles intact, sensation grossly intact.  HEENT: Normocephalic, atraumatic, pupils equal round reactive to light, neck supple, no masses, no lymphadenopathy, thyroid nonpalpable.  Skin: Warm and dry, no rashes. Cardiac: Regular rate and rhythm, no murmurs rubs or gallops, no lower extremity edema.  Respiratory: Clear to auscultation bilaterally. Not using accessory muscles, speaking in full sentences. Right Knee: Visible swollen with a effusion, fluid wave, and lateral joint line pain. ROM normal in flexion and extension and lower leg rotation. Ligaments with solid consistent endpoints including ACL, PCL, LCL, MCL. Negative Mcmurray's and provocative meniscal tests. Non painful patellar compression. Patellar and quadriceps tendons unremarkable. Hamstring and quadriceps strength is normal.  Impression and Recommendations:

## 2015-06-07 NOTE — Assessment & Plan Note (Signed)
Right-sided effusion with lateral joint line pain. Meloxicam, return to see me in 2 weeks, we will drain inject the knee if no better.

## 2015-06-13 ENCOUNTER — Other Ambulatory Visit: Payer: Self-pay

## 2015-06-13 DIAGNOSIS — Z1231 Encounter for screening mammogram for malignant neoplasm of breast: Secondary | ICD-10-CM

## 2015-06-21 ENCOUNTER — Ambulatory Visit: Payer: Medicare Other | Admitting: Sports Medicine

## 2015-06-22 DIAGNOSIS — R310 Gross hematuria: Secondary | ICD-10-CM | POA: Diagnosis not present

## 2015-06-22 DIAGNOSIS — Z0000 Encounter for general adult medical examination without abnormal findings: Secondary | ICD-10-CM | POA: Diagnosis not present

## 2015-06-24 ENCOUNTER — Ambulatory Visit (INDEPENDENT_AMBULATORY_CARE_PROVIDER_SITE_OTHER): Payer: Medicare Other | Admitting: Sports Medicine

## 2015-06-24 VITALS — BP 145/97 | HR 77 | Resp 16 | Wt 108.6 lb

## 2015-06-24 DIAGNOSIS — F411 Generalized anxiety disorder: Secondary | ICD-10-CM | POA: Diagnosis not present

## 2015-06-24 DIAGNOSIS — M170 Bilateral primary osteoarthritis of knee: Secondary | ICD-10-CM | POA: Diagnosis not present

## 2015-06-24 MED ORDER — ALPRAZOLAM 0.25 MG PO TABS: 0.2500 mg | ORAL_TABLET | Freq: Three times a day (TID) | ORAL | Status: DC | PRN

## 2015-06-24 NOTE — Assessment & Plan Note (Signed)
Doing well with meloxicam, no changes needed.

## 2015-06-24 NOTE — Assessment & Plan Note (Signed)
Very low PHQ9 score, GAD 7 score , symptoms are very intermittent, adding low-dose Xanax for use as needed.

## 2015-06-24 NOTE — Progress Notes (Signed)
  Subjective:    CC: Follow-up  HPI: Bilateral knee osteoarthritis: Doing extremely well with meloxicam and rehabilitation exercises.  Generalized anxiety: Mild, has occasional episodes of panic, we have not used any medications thus far however she is agreeable to try a medication for use as needed. No suicidal or homicidal ideation.  Past medical history, Surgical history, Family history not pertinant except as noted below, Social history, Allergies, and medications have been entered into the medical record, reviewed, and no changes needed.   Review of Systems: No fevers, chills, night sweats, weight loss, chest pain, or shortness of breath.   Objective:    General: Well Developed, well nourished, and in no acute distress.  Neuro: Alert and oriented x3, extra-ocular muscles intact, sensation grossly intact.  HEENT: Normocephalic, atraumatic, pupils equal round reactive to light, neck supple, no masses, no lymphadenopathy, thyroid nonpalpable.  Skin: Warm and dry, no rashes. Cardiac: Regular rate and rhythm, no murmurs rubs or gallops, no lower extremity edema.  Respiratory: Clear to auscultation bilaterally. Not using accessory muscles, speaking in full sentences.  Impression and Recommendations:   I spent 25 minutes with this patient, greater than 50% was face-to-face time counseling regarding the above diagnoses

## 2015-07-08 DIAGNOSIS — H1851 Endothelial corneal dystrophy: Secondary | ICD-10-CM | POA: Diagnosis not present

## 2015-07-11 ENCOUNTER — Telehealth: Payer: Self-pay

## 2015-07-11 NOTE — Telephone Encounter (Signed)
Pt left VM stating her dog got a claw into a mole on her back that bled significantly and would like to know if she needs to be seen for it. Please advise.

## 2015-07-12 NOTE — Telephone Encounter (Signed)
Pt informed of information. Stated not having any symptoms or irritation and will continue to keep an eye on the area.

## 2015-07-12 NOTE — Telephone Encounter (Signed)
Is it still bleeding? Any redness, drainage, fevers, or chills?  If none of the above then we can probably just watch it.

## 2015-07-26 ENCOUNTER — Ambulatory Visit
Admission: RE | Admit: 2015-07-26 | Discharge: 2015-07-26 | Disposition: A | Discharge status: Home / Self Care | Payer: Medicare Other | Source: Ambulatory Visit

## 2015-07-26 DIAGNOSIS — Z1231 Encounter for screening mammogram for malignant neoplasm of breast: Secondary | ICD-10-CM

## 2015-10-14 DIAGNOSIS — Z947 Corneal transplant status: Secondary | ICD-10-CM | POA: Diagnosis not present

## 2015-10-14 DIAGNOSIS — H1851 Endothelial corneal dystrophy: Secondary | ICD-10-CM | POA: Diagnosis not present

## 2015-10-20 DIAGNOSIS — L821 Other seborrheic keratosis: Secondary | ICD-10-CM | POA: Diagnosis not present

## 2015-11-07 ENCOUNTER — Encounter: Payer: Self-pay | Admitting: Sports Medicine

## 2015-11-07 ENCOUNTER — Ambulatory Visit (INDEPENDENT_AMBULATORY_CARE_PROVIDER_SITE_OTHER): Payer: Medicare Other | Admitting: Sports Medicine

## 2015-11-07 DIAGNOSIS — M7918 Myalgia, other site: Secondary | ICD-10-CM | POA: Insufficient documentation

## 2015-11-07 DIAGNOSIS — S86812A Strain of other muscle(s) and tendon(s) at lower leg level, left leg, initial encounter: Secondary | ICD-10-CM | POA: Diagnosis not present

## 2015-11-07 DIAGNOSIS — M791 Myalgia, unspecified site: Secondary | ICD-10-CM | POA: Insufficient documentation

## 2015-11-07 DIAGNOSIS — M609 Myositis, unspecified: Secondary | ICD-10-CM | POA: Diagnosis not present

## 2015-11-07 DIAGNOSIS — IMO0001 Reserved for inherently not codable concepts without codable children: Secondary | ICD-10-CM

## 2015-11-07 DIAGNOSIS — M79651 Pain in right thigh: Secondary | ICD-10-CM | POA: Insufficient documentation

## 2015-11-07 DIAGNOSIS — M79605 Pain in left leg: Secondary | ICD-10-CM | POA: Insufficient documentation

## 2015-11-07 NOTE — Assessment & Plan Note (Signed)
Compression with compressive bandage. Heel lifts, rehabilitation exercises. Return to see me as needed. I did recommend that she start a graduated return to running after about a week.

## 2015-11-07 NOTE — Assessment & Plan Note (Signed)
With some presyncopal symptoms on a couple of episodes during running. Checking electrolytes, hemoglobin. I have advised that she increase her fluid consumption and liberalize salt in her diet.

## 2015-11-07 NOTE — Progress Notes (Signed)
  Subjective:    CC: Left calf pain   HPI: 65 yo F presenting with three days of left calf pain.  She says she had a dull pain in her L calf while doing housework last week that resolved quickly.  However, during a 6 mile run three days ago she developed this dull L calf pain again, which did not radiate.  Pain was bad enough that it caused her to limp and drop out of the race.  Ever since then, pain has persisted but is gradually improving. She has tried ice and Mobic for pain without relief.  She thinks she strained her muscle but is coming in today to make sure nothing more is going on.  She has not been able to run for the past three days.  She also mentions that she has gotten lightheaded during her past couple of runs but has not fainted.  She says she normally does not drink anything on her runs but that she recently purchased a water bottle to take on her runs.   Past medical history, Surgical history, Family history not pertinant except as noted below, Social history, Allergies, and medications have been entered into the medical record, reviewed, and no changes needed.   Review of Systems: No fevers, chills, night sweats, weight loss, chest pain, or shortness of breath.   Objective:    General: Well Developed, well nourished, and in no acute distress.  Neuro: Alert and oriented x3, extra-ocular muscles intact, sensation grossly intact.  HEENT: Normocephalic, atraumatic, pupils equal round reactive to light, neck supple, no masses, no lymphadenopathy, thyroid nonpalpable.  Skin: Warm and dry, no rashes. Cardiac: Regular rate and rhythm, no murmurs rubs or gallops, no lower extremity edema.  Respiratory: Clear to auscultation bilaterally. Not using accessory muscles, speaking in full sentences. Left Knee: Normal to inspection with no erythema or effusion or obvious bony abnormalities. Palpation normal with no warmth, joint line tenderness, patellar tenderness, or condyle tenderness. ROM  full in flexion and extension and lower leg rotation. Ligaments with solid consistent endpoints including ACL, PCL, LCL, MCL. Negative Mcmurray's, Apley's, and Thessalonian tests. Non painful patellar compression. Patellar glide without crepitus. Patellar and quadriceps tendons unremarkable. Hamstring and quadriceps strength is normal.  Negative Thompson's sign.      Impression and Recommendations:    Left calf strain -Heel lift, compressive dressing, given at-home exercises  -Can resume workout in 2 weeks (graduated workout regimen).   Lightheadedness during workouts -likely dehydration, but will check electrolytes and Hb today -encouraged hydration prior/during/after workouts

## 2015-11-08 LAB — CBC
HCT: 37.8 % (ref 35.0–45.0)
Hemoglobin: 12.9 g/dL (ref 11.7–15.5)
MCH: 30.1 pg (ref 27.0–33.0)
MCHC: 34.1 g/dL (ref 32.0–36.0)
MCV: 88.3 fL (ref 80.0–100.0)
MPV: 10.9 fL (ref 7.5–12.5)
Platelets: 144 K/uL (ref 140–400)
RBC: 4.28 MIL/uL (ref 3.80–5.10)
RDW: 14 % (ref 11.0–15.0)
WBC: 4 K/uL (ref 3.8–10.8)

## 2015-11-08 LAB — COMPREHENSIVE METABOLIC PANEL WITH GFR
ALT: 16 U/L (ref 6–29)
AST: 26 U/L (ref 10–35)
Alkaline Phosphatase: 45 U/L (ref 33–130)
CO2: 28 mmol/L (ref 20–31)
Creat: 0.7 mg/dL (ref 0.50–0.99)
Sodium: 144 mmol/L (ref 135–146)
Total Bilirubin: 0.8 mg/dL (ref 0.2–1.2)

## 2015-11-08 LAB — COMPREHENSIVE METABOLIC PANEL
Albumin: 3.9 g/dL (ref 3.6–5.1)
BUN: 20 mg/dL (ref 7–25)
Calcium: 8.9 mg/dL (ref 8.6–10.4)
Chloride: 105 mmol/L (ref 98–110)
Glucose, Bld: 81 mg/dL (ref 65–99)
Potassium: 3.9 mmol/L (ref 3.5–5.3)
Total Protein: 6.3 g/dL (ref 6.1–8.1)

## 2015-11-08 LAB — CK: Total CK: 87 U/L (ref 7–177)

## 2015-11-29 ENCOUNTER — Ambulatory Visit (INDEPENDENT_AMBULATORY_CARE_PROVIDER_SITE_OTHER): Payer: Medicare Other | Admitting: Sports Medicine

## 2015-11-29 ENCOUNTER — Ambulatory Visit (INDEPENDENT_AMBULATORY_CARE_PROVIDER_SITE_OTHER): Payer: Medicare Other

## 2015-11-29 ENCOUNTER — Encounter: Payer: Self-pay | Admitting: Sports Medicine

## 2015-11-29 DIAGNOSIS — M79605 Pain in left leg: Secondary | ICD-10-CM

## 2015-11-29 DIAGNOSIS — M79662 Pain in left lower leg: Secondary | ICD-10-CM | POA: Diagnosis not present

## 2015-11-29 NOTE — Progress Notes (Signed)
  Subjective:    CC: L calf pain follow-up   HPI: 65 yo presenting for follow-up for one month of L calf pain, previously diagnosed with calf strain at last appointment.  Since the last appointment, she has been consistent with PT exercises, heel lifts, and compression wraps, all of which have been helpful.  However, she continues to have intermittent calf pain that presents throughout the day - not necessarily associated with exercise.  Pain does not radiate down or up her leg and is localized to one spot on her posterior tibia.  She is concerned about doing more damage to her calf when training for two half-marathons next month.    Past medical history, Surgical history, Family history not pertinant except as noted below, Social history, Allergies, and medications have been entered into the medical record, reviewed, and no changes needed.   Review of Systems: No fevers, chills, night sweats, weight loss, chest pain, or shortness of breath.   Objective:    General: Well Developed, well nourished, and in no acute distress.  Neuro: Alert and oriented x3, extra-ocular muscles intact, sensation grossly intact.  HEENT: Normocephalic, atraumatic, pupils equal round reactive to light, neck supple, no masses, no lymphadenopathy, thyroid nonpalpable.  Skin: Warm and dry, no rashes. Cardiac: Regular rate and rhythm, no murmurs rubs or gallops, no lower extremity edema.  Respiratory: Clear to auscultation bilaterally. Not using accessory muscles, speaking in full sentences. Left Knee: Normal to inspection with no erythema or effusion or obvious bony abnormalities. Palpation normal with no warmth, joint line tenderness, patellar tenderness, or condyle tenderness. ROM full in flexion and extension and lower leg rotation. Ligaments with solid consistent endpoints including ACL, PCL, LCL, MCL. Negative Mcmurray's, Apley's, and Thessalonian tests. Non painful patellar compression. Patellar glide without  crepitus.  Patellar and quadriceps tendons unremarkable. Hamstring and quadriceps strength is normal.   Slight TTP along posterior calf.       Impression and Recommendations:    1. Left calf pain - likely calf strain but cannot rule out tibial stress fracture.   -Will get XRay today and MRI on Monday  -Continue heel lifts, compression stockings, PT exercises, conservative exercise until then

## 2015-11-29 NOTE — Assessment & Plan Note (Signed)
Improved with conservative measures however still has some pain, does have a couple of marathons coming up. X-rays, my differential is somewhere between Strain and posterior tibial stress injury. I'm also going to pull the trigger for an MRI without contrast of her left tibia and fibula.

## 2015-12-05 ENCOUNTER — Ambulatory Visit (INDEPENDENT_AMBULATORY_CARE_PROVIDER_SITE_OTHER): Payer: Medicare Other

## 2015-12-05 DIAGNOSIS — M79662 Pain in left lower leg: Secondary | ICD-10-CM | POA: Diagnosis not present

## 2015-12-05 DIAGNOSIS — M79605 Pain in left leg: Secondary | ICD-10-CM | POA: Diagnosis not present

## 2015-12-05 DIAGNOSIS — R600 Localized edema: Secondary | ICD-10-CM

## 2015-12-06 ENCOUNTER — Encounter: Payer: Self-pay | Admitting: Sports Medicine

## 2015-12-06 ENCOUNTER — Ambulatory Visit (INDEPENDENT_AMBULATORY_CARE_PROVIDER_SITE_OTHER): Payer: Medicare Other | Admitting: Sports Medicine

## 2015-12-06 DIAGNOSIS — M79605 Pain in left leg: Secondary | ICD-10-CM | POA: Diagnosis not present

## 2015-12-06 NOTE — Assessment & Plan Note (Signed)
MRI showed simple edema in the soleus muscle consistent with a strain. She will avoid running for 2 weeks, calf sleeve Placed. Return to See Me As Needed. May cross train on elliptical and bike.

## 2015-12-06 NOTE — Progress Notes (Signed)
  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 She is agreeable to stop running for 2 weeks and cross train.  Past medical history, Surgical history, Family history not pertinant except as noted below, Social history, Allergies, and medications have been entered into the medical record, reviewed, and no changes needed.   Review of Systems: No fevers, chills, night sweats, weight loss, chest pain, or shortness of breath.   Objective:    General: Well Developed, well nourished, and in no acute distress.  Neuro: Alert and oriented x3, extra-ocular muscles intact, sensation grossly intact.  HEENT: Normocephalic, atraumatic, pupils equal round reactive to light, neck supple, no masses, no lymphadenopathy, thyroid nonpalpable.  Skin: Warm and dry, no rashes. Cardiac: Regular rate and rhythm, no murmurs rubs or gallops, no lower extremity edema.  Respiratory: Clear to auscultation bilaterally. Not using accessory muscles, speaking in full sentences.  MRI personally reviewed and shows some edema in the soleus at the musculotendinous junction.  Impression and Recommendations:    Left leg pain MRI showed simple edema in the soleus muscle consistent with a strain. She will avoid running for 2 weeks, calf sleeve Placed. Return to See Me As Needed. May cross train on elliptical and bike. I spent 25 minutes with this patient, greater than 50% was face-to-face time counseling regarding the above diagnoses

## 2015-12-13 ENCOUNTER — Other Ambulatory Visit: Payer: Self-pay

## 2016-01-07 ENCOUNTER — Encounter: Payer: Self-pay | Admitting: Emergency Medicine

## 2016-01-07 ENCOUNTER — Emergency Department (INDEPENDENT_AMBULATORY_CARE_PROVIDER_SITE_OTHER): Payer: Medicare Other

## 2016-01-07 ENCOUNTER — Emergency Department (INDEPENDENT_AMBULATORY_CARE_PROVIDER_SITE_OTHER)
Admission: EM | Admit: 2016-01-07 | Discharge: 2016-01-07 | Disposition: A | Discharge status: Home / Self Care | Payer: Medicare Other | Source: Home / Self Care | Attending: Family Medicine | Admitting: Family Medicine

## 2016-01-07 DIAGNOSIS — M25572 Pain in left ankle and joints of left foot: Secondary | ICD-10-CM

## 2016-01-07 DIAGNOSIS — M79672 Pain in left foot: Secondary | ICD-10-CM | POA: Diagnosis not present

## 2016-01-07 NOTE — ED Triage Notes (Addendum)
Pt c/o left ankle pain after tripping while walking down stairs this am. She denies pain with pressure only painful with certain movement. No previous injuries.

## 2016-01-07 NOTE — ED Provider Notes (Signed)
CSN: [id number]     Arrival date & time 01/07/16  [id number] History   First MD Initiated Contact with Patient 01/07/16 [phone]     Chief Complaint  Patient presents with  . Ankle Pain   (Consider location/radiation/quality/duration/timing/severity/associated sxs/prior Treatment) HPI Joan Lopez is a 65 y.o. female presenting to UC with c/o Left anterior ankle and dorsal foot pain that started this morning after she missed a step and twisted her ankle slightly.  She did not fall.  Pain is 0000000, worse with certain movements such as plantarflexion.  Pt notes she is scheduled to run a marathon tomorrow and wanted to make sure she was okay to still run.  No pain medication taken PTA.     Past Medical History:  Diagnosis Date  . Abnormal LFTs 10/29/2012  . Acquired bilateral renal cysts   . Arthritis   . Bilateral bunions   . Constipation 10/16/2011  . DDD (degenerative disc disease)    spine w narrowing  . Dehydration, mild 12/26/2011  . Diverticulitis   . Exercise counseling 11/14/2011  . Fuch's endothelial dystrophy   . Guttate psoriasis   . Hematuria 08/24/2011  . Hemorrhoids   . History of chicken pox   . Hyperlipidemia   . Kidney stones   . Lipoma of arm 07/13/2012  . Lipoma of arm 10/07/2012   Left   . Mass of arm 07/13/2012   Has had them in past    . 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 Post-menopausal bleeding 10/08/2011  . Reflux 04/17/2012  . Renal insufficiency 01/11/2013  . Thrombocytopenia ([place]) 09/27/2011   Past Surgical History:  Procedure Laterality Date  . APPENDECTOMY    . CESAREAN SECTION    . CHOLECYSTECTOMY    . COLONOSCOPY  2005   (records here)[place] - diverticulosis and hemorrhoids  . CORNEAL TRANSPLANT  2013  . CYSTOSCOPY WITH RETROGRADE PYELOGRAM, URETEROSCOPY AND STENT PLACEMENT Right 10/22/2013   Procedure: CYSTOSCOPY WITH RETROGRADE PYELOGRAM, URETEROSCOPY ;  Surgeon: Raynelle Bring, MD;  Location: [facility];  Service: Urology;  Laterality: Right;  . LIPOMA  EXCISION  2014   x 3, both arms  . TONSILLECTOMY     possible adenoids  . TOTAL ABDOMINAL HYSTERECTOMY     for fibroids  . TUBAL LIGATION    . WRIST FRACTURE SURGERY     right, titanium plate and pins   Family History  Problem Relation Age of Onset  . Stroke Mother   . Heart disease Mother   . Kidney disease Father   . Breast cancer Sister 53  . Cancer Sister     ductal carcinoma breast- had it in 1999 and just found out its in the opposite breast  . Cancer Maternal Grandmother     brain tumor  . Breast cancer Paternal Grandmother     post menopausal   . Cancer Paternal Grandmother     breast  . Pneumonia Maternal Grandfather   . Colon cancer Maternal Aunt 19  . Cancer Maternal Aunt     colon  . Cancer Paternal Uncle     neck tumor  . Stomach cancer Maternal Aunt   . Cancer Maternal Aunt     stomach  . Kidney cancer Cousin   . Esophageal cancer Neg Hx   . Pancreatic cancer Neg Hx   . Rectal cancer Neg Hx    Social History  Substance Use Topics  . Smoking status: Never Smoker  . Smokeless tobacco: Never Used  .  Alcohol use No   OB History    No data available     Review of Systems  Musculoskeletal: Positive for arthralgias. Negative for gait problem, joint swelling and myalgias.  Skin: Negative for color change and wound.  Neurological: Negative for weakness and numbness.    Allergies  Augmentin [amoxicillin-pot clavulanate]  Home Medications   Prior to Admission medications   Medication Sig Start Date End Date Taking? Authorizing Provider  ALPRAZolam (XANAX) 0.25 MG tablet Take 1 tablet (0.25 mg total) by mouth 3 (three) times daily as needed for anxiety. 06/24/15   Silverio Decamp, MD  loteprednol (LOTEMAX) 0.5 % ophthalmic suspension Place 1 drop into both eyes as directed. Reported on 06/07/2015 08/21/12   Mosie Lukes, MD  meloxicam (MOBIC) 15 MG tablet One tab PO qAM with breakfast for 2 weeks, then daily prn pain. 06/07/15   Silverio Decamp, MD   Meds Ordered and Administered this Visit  Medications - No data to display  BP 154/82 (BP Location: Right Arm)   Pulse (!) 56   Temp 97.6 F (36.4 C) (Oral)   Wt 113 lb (51.3 kg)   SpO2 100%   BMI 22.07 kg/m  No data found.   Physical Exam  Constitutional: She is oriented to person, place, and time. She appears well-developed and well-nourished.  HENT:  Head: Normocephalic and atraumatic.  Eyes: EOM are normal.  Neck: Normal range of motion.  Cardiovascular: Normal rate.   Pulmonary/Chest: Effort normal.  Musculoskeletal: Normal range of motion. She exhibits tenderness. She exhibits no edema or deformity.  Left ankle and foot: no edema or deformity. Full ROM. Tenderness to anterior ankle and dorsum of foot.  Increased pain with plantarflexion against resistance.  Calf is soft, non-tender.  Neurological: She is alert and oriented to person, place, and time.  Skin: Skin is warm and dry. Capillary refill takes less than 2 seconds. No erythema.  Left ankle and foot: skin in tact, no ecchymosis or erythema.  Psychiatric: She has a normal mood and affect. Her behavior is normal.  Nursing note and vitals reviewed.   Urgent Care Course   Clinical Course    Procedures (including critical care time)  Labs Review Labs Reviewed - No data to display  Imaging Review Dg Ankle Complete Left  Result Date: 01/07/2016 CLINICAL DATA:  Anterior left ankle pain after tripping. EXAM: LEFT ANKLE COMPLETE - 3+ VIEW COMPARISON:  None. FINDINGS: There is no evidence of fracture, dislocation, or joint effusion. There is no evidence of arthropathy or other focal bone abnormality. Soft tissues are unremarkable. IMPRESSION: No acute osseous abnormality. Electronically Signed   By: Ashley Royalty M.D.   On: 01/07/2016 10:11   Dg Foot Complete Left  Result Date: 01/07/2016 CLINICAL DATA:  Left foot pain after tripping. EXAM: LEFT FOOT - COMPLETE 3+ VIEW COMPARISON:  None. FINDINGS:  There is no evidence of fracture or dislocation. There is slight joint space narrowing of the first MTP articulation with 24 degrees of hallux valgus and bony bunion. Soft tissues are unremarkable. IMPRESSION: No acute osseous abnormality. Slight joint space narrowing of the first MTP articulation with bony bunion and slight hallux valgus. Electronically Signed   By: Ashley Royalty M.D.   On: 01/07/2016 10:11    MDM   1. Left ankle pain    Pt c/o Left ankle pain after twisting it this morning.  Skin in tact, no ecchymosis, erythema, warmth, or edema.  Plain films: negative for  acute findings.  Bony bunion noted on foot plain films. Pt was already away of that finding and denies pain in that area.  Reassured pt of no fracture or dislocation. Pain likely due to minimal sprain. Home care instructions provided. Advised she may try using an ankle splint tomorrow during race for extra support. She may run, however, she should stop if she starts to experience moderate to severe pain. F/u with PCP for recheck of symptoms in 1-2 weeks as needed. Patient verbalized understanding and agreement with treatment plan.     Noland Fordyce, PA-C 01/07/16 1046

## 2016-01-31 DIAGNOSIS — Z947 Corneal transplant status: Secondary | ICD-10-CM | POA: Diagnosis not present

## 2016-01-31 DIAGNOSIS — H1851 Endothelial corneal dystrophy: Secondary | ICD-10-CM | POA: Diagnosis not present

## 2016-03-16 DIAGNOSIS — H1851 Endothelial corneal dystrophy: Secondary | ICD-10-CM | POA: Diagnosis not present

## 2016-05-30 ENCOUNTER — Telehealth: Payer: Self-pay | Admitting: Sports Medicine

## 2016-05-30 NOTE — Telephone Encounter (Signed)
Called pt lvm about flu shot 05/30/16 @ 11:39am

## 2016-05-30 NOTE — Telephone Encounter (Signed)
Documented

## 2016-05-30 NOTE — Telephone Encounter (Signed)
Pt called back Declined flu shot 05/30/16 @ 11:56am

## 2016-06-26 ENCOUNTER — Other Ambulatory Visit: Payer: Self-pay | Admitting: Sports Medicine

## 2016-06-26 DIAGNOSIS — Z1231 Encounter for screening mammogram for malignant neoplasm of breast: Secondary | ICD-10-CM

## 2016-07-26 ENCOUNTER — Ambulatory Visit
Admission: RE | Admit: 2016-07-26 | Discharge: 2016-07-26 | Disposition: A | Discharge status: Home / Self Care | Payer: Medicare Other | Source: Ambulatory Visit | Attending: Sports Medicine | Admitting: Sports Medicine

## 2016-07-26 DIAGNOSIS — Z1231 Encounter for screening mammogram for malignant neoplasm of breast: Secondary | ICD-10-CM

## 2016-07-31 DIAGNOSIS — H1851 Endothelial corneal dystrophy: Secondary | ICD-10-CM | POA: Diagnosis not present

## 2016-08-09 ENCOUNTER — Emergency Department (INDEPENDENT_AMBULATORY_CARE_PROVIDER_SITE_OTHER)
Admission: EM | Admit: 2016-08-09 | Discharge: 2016-08-09 | Disposition: A | Discharge status: Home / Self Care | Payer: Medicare Other | Source: Home / Self Care | Attending: Family Medicine | Admitting: Family Medicine

## 2016-08-09 ENCOUNTER — Encounter: Payer: Self-pay | Admitting: *Deleted

## 2016-08-09 DIAGNOSIS — S0181XA Laceration without foreign body of other part of head, initial encounter: Secondary | ICD-10-CM | POA: Diagnosis not present

## 2016-08-09 DIAGNOSIS — Z23 Encounter for immunization: Secondary | ICD-10-CM

## 2016-08-09 DIAGNOSIS — S8002XA Contusion of left knee, initial encounter: Secondary | ICD-10-CM

## 2016-08-09 DIAGNOSIS — R6884 Jaw pain: Secondary | ICD-10-CM

## 2016-08-09 DIAGNOSIS — T07XXXA Unspecified multiple injuries, initial encounter: Secondary | ICD-10-CM

## 2016-08-09 MED ORDER — TETANUS-DIPHTH-ACELL PERTUSSIS 5-2.5-18.5 LF-MCG/0.5 IM SUSP
0.5000 mL | Freq: Once | INTRAMUSCULAR | Status: AC
  Administered 2016-08-09: 0.5 mL via INTRAMUSCULAR

## 2016-08-09 NOTE — ED Triage Notes (Signed)
Patient reports tripping while running this afternoon. She sustained a laceration to her chin and several other abrasions to her hands. Due for tetanus. Also c/o jaw discomfort at times.

## 2016-08-09 NOTE — ED Provider Notes (Signed)
CSN: [id number]     Arrival date & time 08/09/16  1256 History   First MD Initiated Contact with Patient 08/09/16 1331     Chief Complaint  Patient presents with  . Laceration   (Consider location/radiation/quality/duration/timing/severity/associated sxs/prior Treatment) HPI  Joan Lopez is a 66 y.o. female presenting to UC with c/o laceration to chin, jaw discomfort, abrasions to hands and arms as well as a bruise on her Left knee that occurred around 11:30AM due to trip and fall while running.  Denies LOC.  Pt notes she was able to run again after the fall but noticed the wound on her chin felt pretty deep.  Last Tetanus- 2008.  She is not on blood thinners. Bleeding controlled PTA.     Past Medical History:  Diagnosis Date  . Abnormal LFTs 10/29/2012  . Acquired bilateral renal cysts   . Arthritis   . Bilateral bunions   . Constipation 10/16/2011  . DDD (degenerative disc disease)    spine w narrowing  . Dehydration, mild 12/26/2011  . Diverticulitis   . Exercise counseling 11/14/2011  . Fuch's endothelial dystrophy   . Guttate psoriasis   . Hematuria 08/24/2011  . Hemorrhoids   . History of chicken pox   . Hyperlipidemia   . Kidney stones   . Lipoma of arm 07/13/2012  . Lipoma of arm 10/07/2012   Left   . Mass of arm 07/13/2012   Has had them in past    . 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 Post-menopausal bleeding 10/08/2011  . Reflux 04/17/2012  . Renal insufficiency 01/11/2013  . Thrombocytopenia ([place]) 09/27/2011   Past Surgical History:  Procedure Laterality Date  . APPENDECTOMY    . CESAREAN SECTION    . CHOLECYSTECTOMY    . COLONOSCOPY  2005   (records here)[place] - diverticulosis and hemorrhoids  . CORNEAL TRANSPLANT  2013  . CYSTOSCOPY WITH RETROGRADE PYELOGRAM, URETEROSCOPY AND STENT PLACEMENT Right 10/22/2013   Procedure: CYSTOSCOPY WITH RETROGRADE PYELOGRAM, URETEROSCOPY ;  Surgeon: Raynelle Bring, MD;  Location: [facility];  Service: Urology;  Laterality: Right;    . LIPOMA EXCISION  2014   x 3, both arms  . TONSILLECTOMY     possible adenoids  . TOTAL ABDOMINAL HYSTERECTOMY     for fibroids  . TUBAL LIGATION    . WRIST FRACTURE SURGERY     right, titanium plate and pins   Family History  Problem Relation Age of Onset  . Stroke Mother   . Heart disease Mother   . Kidney disease Father   . Breast cancer Sister 69  . Cancer Sister     ductal carcinoma breast- had it in 1999 and just found out its in the opposite breast  . Cancer Maternal Grandmother     brain tumor  . Breast cancer Paternal Grandmother     post menopausal   . Cancer Paternal Grandmother     breast  . Pneumonia Maternal Grandfather   . Colon cancer Maternal Aunt 31  . Cancer Maternal Aunt     colon  . Cancer Paternal Uncle     neck tumor  . Stomach cancer Maternal Aunt   . Cancer Maternal Aunt     stomach  . Kidney cancer Cousin   . Esophageal cancer Neg Hx   . Pancreatic cancer Neg Hx   . Rectal cancer Neg Hx    Social History  Substance Use Topics  . Smoking status: Never Smoker  . Smokeless  tobacco: Never Used  . Alcohol use No   OB History    No data available     Review of Systems  HENT: Positive for dental problem (jaw discomfort). Negative for facial swelling, mouth sores and tinnitus.   Respiratory: Negative for shortness of breath.   Cardiovascular: Negative for chest pain and palpitations.  Gastrointestinal: Negative for nausea and vomiting.  Musculoskeletal: Negative for arthralgias and myalgias.  Skin: Positive for color change and wound.  Neurological: Negative for dizziness, light-headedness and headaches.    Allergies  Augmentin [amoxicillin-pot clavulanate]  Home Medications   Prior to Admission medications   Medication Sig Start Date End Date Taking? Authorizing Provider  ALPRAZolam (XANAX) 0.25 MG tablet Take 1 tablet (0.25 mg total) by mouth 3 (three) times daily as needed for anxiety. 06/24/15   Silverio Decamp, MD   loteprednol (LOTEMAX) 0.5 % ophthalmic suspension Place 1 drop into both eyes as directed. Reported on 06/07/2015 08/21/12   Mosie Lukes, MD  meloxicam (MOBIC) 15 MG tablet One tab PO qAM with breakfast for 2 weeks, then daily prn pain. 06/07/15   Silverio Decamp, MD   Meds Ordered and Administered this Visit   Medications  Tdap (BOOSTRIX) injection 0.5 mL (0.5 mLs Intramuscular Given 08/09/16 1358)    BP (!) 153/81 (BP Location: Left Arm)   Pulse 64   Wt 114 lb (51.7 kg)   SpO2 99%   BMI 22.26 kg/m  No data found.   Physical Exam  Constitutional: She is oriented to person, place, and time. She appears well-developed and well-nourished.  HENT:  Head: Normocephalic. Head is with laceration.    Nose: Nose normal.  Mouth/Throat: Uvula is midline, oropharynx is clear and moist and mucous membranes are normal. No trismus in the jaw.  1cm laceration exposing adipose tissue on bottom of chin. No foreign bodies seen or palpated. Mild surrounding ecchymosis. No active bleeding.   No tenderness to TMJ, no crepitus. No obvious dental injury noted. Teeth in tact and secure.   Eyes: EOM are normal.  Neck: Normal range of motion.  Cardiovascular: Normal rate.   Pulmonary/Chest: Effort normal.  Musculoskeletal: Normal range of motion. She exhibits tenderness. She exhibits no edema.  Left knee: mild edema to medial aspect, minimally tender. Full ROM.   Neurological: She is alert and oriented to person, place, and time.  Skin: Skin is warm and dry.  Laceration to bottom of chin.  Multiple superficial abrasions to bilateral forearms and hands.  Contusion to Left medial knee.   Psychiatric: She has a normal mood and affect. Her behavior is normal.  Nursing note and vitals reviewed.   Urgent Care Course     .Marland KitchenLaceration Repair Date/Time: 08/09/2016 2:59 PM Performed by: Noland Fordyce Authorized by: Theone Murdoch A   Consent:    Consent obtained:  Verbal   Consent given by:   Patient   Risks discussed:  Infection, pain and poor cosmetic result   Alternatives discussed:  Delayed treatment and no treatment Anesthesia (see MAR for exact dosages):    Anesthesia method:  Topical application   Topical anesthetic:  LET Laceration details:    Location:  Face   Face location:  Chin   Length (cm):  1   Depth (mm):  4 Repair type:    Repair type:  Simple Pre-procedure details:    Preparation:  Patient was prepped and draped in usual sterile fashion Exploration:    Hemostasis achieved with:  Direct pressure  Wound exploration: wound explored through full range of motion and entire depth of wound probed and visualized     Wound extent: areolar tissue violated     Wound extent: no foreign bodies/material noted, no muscle damage noted and no underlying fracture noted     Contaminated: yes (scant dirt on outter edge of wound)   Treatment:    Area cleansed with:  Saline   Amount of cleaning:  Extensive   Irrigation solution:  Sterile saline   Irrigation volume:  20cc   Irrigation method:  Syringe Skin repair:    Repair method:  Sutures   Suture size:  5-0   Suture material:  Prolene   Suture technique:  Simple interrupted   Number of sutures:  3 Approximation:    Approximation:  Close   Vermilion border: well-aligned   Post-procedure details:    Dressing:  Antibiotic ointment and non-adherent dressing   Patient tolerance of procedure:  Tolerated well, no immediate complications   (including critical care time)  Labs Review Labs Reviewed - No data to display  Imaging Review No results found.   MDM   1. Chin laceration, initial encounter   2. Multiple abrasions   3. Contusion of left knee, initial encounter   4. Jaw pain    Tdap updated today. Laceration repaired with 3 sutures as noted above Doubt jaw fracture as pt able to speak w/o difficulty. No crepitus or malalignment palpated on exam.  Encouraged rest, acetaminophen and ibuprofen. Pt  declined pain medication in UC.  f/u in 5-7 days in UC or with PCP for suture removal, sooner if concerns arise.     Noland Fordyce, PA-C 08/09/16 1505

## 2016-08-12 ENCOUNTER — Telehealth: Payer: Self-pay | Admitting: Emergency Medicine

## 2016-08-12 NOTE — Telephone Encounter (Signed)
Spoke with patient she states she is feeling better, seems to be healing. Will return to office weds. For stiches removal. AP, CMA

## 2016-08-15 ENCOUNTER — Telehealth: Payer: Self-pay | Admitting: Family Medicine

## 2016-08-15 ENCOUNTER — Encounter: Payer: Self-pay | Admitting: Emergency Medicine

## 2016-08-15 NOTE — ED Notes (Signed)
Patient her for suture removal (3) on chin; placed 08/09/16. Denies any pain/problems.

## 2016-08-15 NOTE — Telephone Encounter (Signed)
Subjective:  Patient returns for suture removal from chin laceration.  No complaints.  Objective:  Laceration chin:  No swelling, erythema, tenderness, or drainage.  ASSESSMENT/Plan:  Well healed laceration chin.  Sutures removed

## 2016-10-09 ENCOUNTER — Encounter: Payer: Self-pay | Admitting: Sports Medicine

## 2016-10-09 DIAGNOSIS — L821 Other seborrheic keratosis: Secondary | ICD-10-CM | POA: Diagnosis not present

## 2016-10-09 DIAGNOSIS — D2262 Melanocytic nevi of left upper limb, including shoulder: Secondary | ICD-10-CM | POA: Diagnosis not present

## 2016-10-09 DIAGNOSIS — D225 Melanocytic nevi of trunk: Secondary | ICD-10-CM | POA: Diagnosis not present

## 2016-10-09 DIAGNOSIS — D235 Other benign neoplasm of skin of trunk: Secondary | ICD-10-CM | POA: Diagnosis not present

## 2016-10-09 DIAGNOSIS — D485 Neoplasm of uncertain behavior of skin: Secondary | ICD-10-CM | POA: Diagnosis not present

## 2017-01-29 DIAGNOSIS — Z947 Corneal transplant status: Secondary | ICD-10-CM | POA: Diagnosis not present

## 2017-01-29 DIAGNOSIS — H1851 Endothelial corneal dystrophy: Secondary | ICD-10-CM | POA: Diagnosis not present

## 2017-02-03 DIAGNOSIS — H579 Unspecified disorder of eye and adnexa: Secondary | ICD-10-CM | POA: Diagnosis not present

## 2017-02-03 DIAGNOSIS — S069X0A Unspecified intracranial injury without loss of consciousness, initial encounter: Secondary | ICD-10-CM | POA: Diagnosis not present

## 2017-02-03 DIAGNOSIS — R930 Abnormal findings on diagnostic imaging of skull and head, not elsewhere classified: Secondary | ICD-10-CM | POA: Diagnosis not present

## 2017-02-03 DIAGNOSIS — S199XXA Unspecified injury of neck, initial encounter: Secondary | ICD-10-CM | POA: Diagnosis not present

## 2017-02-03 DIAGNOSIS — S299XXA Unspecified injury of thorax, initial encounter: Secondary | ICD-10-CM | POA: Diagnosis not present

## 2017-02-03 DIAGNOSIS — S0003XA Contusion of scalp, initial encounter: Secondary | ICD-10-CM | POA: Diagnosis not present

## 2017-02-03 DIAGNOSIS — S0990XA Unspecified injury of head, initial encounter: Secondary | ICD-10-CM | POA: Diagnosis not present

## 2017-02-03 DIAGNOSIS — S0993XA Unspecified injury of face, initial encounter: Secondary | ICD-10-CM | POA: Diagnosis not present

## 2017-02-03 DIAGNOSIS — R51 Headache: Secondary | ICD-10-CM | POA: Diagnosis not present

## 2017-02-03 DIAGNOSIS — R220 Localized swelling, mass and lump, head: Secondary | ICD-10-CM | POA: Diagnosis not present

## 2017-02-03 DIAGNOSIS — W010XXA Fall on same level from slipping, tripping and stumbling without subsequent striking against object, initial encounter: Secondary | ICD-10-CM | POA: Diagnosis not present

## 2017-02-03 DIAGNOSIS — Z947 Corneal transplant status: Secondary | ICD-10-CM | POA: Diagnosis not present

## 2017-02-03 DIAGNOSIS — Z743 Need for continuous supervision: Secondary | ICD-10-CM | POA: Diagnosis not present

## 2017-02-03 DIAGNOSIS — S066X0A Traumatic subarachnoid hemorrhage without loss of consciousness, initial encounter: Secondary | ICD-10-CM | POA: Diagnosis not present

## 2017-02-03 DIAGNOSIS — S0083XA Contusion of other part of head, initial encounter: Secondary | ICD-10-CM | POA: Diagnosis not present

## 2017-02-03 DIAGNOSIS — S066X9A Traumatic subarachnoid hemorrhage with loss of consciousness of unspecified duration, initial encounter: Secondary | ICD-10-CM | POA: Diagnosis not present

## 2017-02-20 DIAGNOSIS — Z7189 Other specified counseling: Secondary | ICD-10-CM | POA: Diagnosis not present

## 2017-03-21 ENCOUNTER — Other Ambulatory Visit: Payer: Self-pay

## 2017-06-24 ENCOUNTER — Other Ambulatory Visit: Payer: Self-pay | Admitting: Sports Medicine

## 2017-06-24 DIAGNOSIS — Z1231 Encounter for screening mammogram for malignant neoplasm of breast: Secondary | ICD-10-CM

## 2017-07-29 ENCOUNTER — Ambulatory Visit: Payer: Medicare Other

## 2017-08-09 DIAGNOSIS — H1851 Endothelial corneal dystrophy: Secondary | ICD-10-CM | POA: Diagnosis not present

## 2017-08-09 DIAGNOSIS — Z947 Corneal transplant status: Secondary | ICD-10-CM | POA: Diagnosis not present

## 2017-08-22 ENCOUNTER — Ambulatory Visit
Admission: RE | Admit: 2017-08-22 | Discharge: 2017-08-22 | Disposition: A | Discharge status: Home / Self Care | Payer: Medicare Other | Source: Ambulatory Visit | Attending: Sports Medicine | Admitting: Sports Medicine

## 2017-08-22 DIAGNOSIS — Z1231 Encounter for screening mammogram for malignant neoplasm of breast: Secondary | ICD-10-CM | POA: Diagnosis not present

## 2017-10-11 ENCOUNTER — Ambulatory Visit (INDEPENDENT_AMBULATORY_CARE_PROVIDER_SITE_OTHER): Payer: Medicare Other | Admitting: Sports Medicine

## 2017-10-11 ENCOUNTER — Encounter: Payer: Self-pay | Admitting: Sports Medicine

## 2017-10-11 VITALS — BP 135/85 | HR 78

## 2017-10-11 DIAGNOSIS — Z0000 Encounter for general adult medical examination without abnormal findings: Secondary | ICD-10-CM

## 2017-10-11 DIAGNOSIS — M791 Myalgia, unspecified site: Secondary | ICD-10-CM

## 2017-10-11 LAB — LIPID PANEL W/REFLEX DIRECT LDL
Cholesterol: 205 mg/dL — ABNORMAL HIGH (ref <200)
HDL: 97 mg/dL (ref >50)
LDL Cholesterol (Calc): 94 mg/dL
Non-HDL Cholesterol (Calc): 108 mg/dL (calc) (ref <130)
Total CHOL/HDL Ratio: 2.1 (calc) (ref <5.0)
Triglycerides: 55 mg/dL (ref <150)

## 2017-10-11 LAB — COMPREHENSIVE METABOLIC PANEL WITH GFR
AST: 18 U/L (ref 10–35)
Albumin: 4.3 g/dL (ref 3.6–5.1)
BUN: 18 mg/dL (ref 7–25)
Chloride: 103 mmol/L (ref 98–110)
Creat: 0.76 mg/dL (ref 0.50–0.99)
Globulin: 2.6 g/dL (ref 1.9–3.7)

## 2017-10-11 LAB — COMPREHENSIVE METABOLIC PANEL
AG Ratio: 1.7 (calc) (ref 1.0–2.5)
ALT: 9 U/L (ref 6–29)
Alkaline phosphatase (APISO): 53 U/L (ref 33–130)
CO2: 29 mmol/L (ref 20–32)
Calcium: 9.4 mg/dL (ref 8.6–10.4)
Glucose, Bld: 105 mg/dL — ABNORMAL HIGH (ref 65–99)
Potassium: 4.3 mmol/L (ref 3.5–5.3)
Sodium: 140 mmol/L (ref 135–146)
Total Bilirubin: 0.9 mg/dL (ref 0.2–1.2)
Total Protein: 6.9 g/dL (ref 6.1–8.1)

## 2017-10-11 LAB — CBC
HCT: 41.5 % (ref 35.0–45.0)
Hemoglobin: 14.2 g/dL (ref 11.7–15.5)
MCH: 30.1 pg (ref 27.0–33.0)
MCHC: 34.2 g/dL (ref 32.0–36.0)
MCV: 87.9 fL (ref 80.0–100.0)
MPV: 11 fL (ref 7.5–12.5)
Platelets: 159 Thousand/uL (ref 140–400)
RBC: 4.72 Million/uL (ref 3.80–5.10)
RDW: 12.8 % (ref 11.0–15.0)
WBC: 3.3 10*3/uL — ABNORMAL LOW (ref 3.8–10.8)

## 2017-10-11 LAB — TSH: TSH: 1.43 mIU/L (ref 0.40–4.50)

## 2017-10-11 NOTE — Progress Notes (Addendum)
Subjective:    CC: Several complaints  HPI: This is a pleasant 67 year old female, she is historically healthy, runs marathons, she is also worried well at times.  More recently she is noted a few days, maybe a week of an odd pulsating or vibrating sensation in her chin, occurs random times during the day.  In addition she has had history of thrombocytopenia, would like her platelets checked.  She is also noted a vague itching in her scalp without dandruff.  I reviewed the past medical history, family history, social history, surgical history, and allergies today and no changes were needed.  Please see the problem list section below in epic for further details.  Past Medical History: Past Medical History:  Diagnosis Date  . Abnormal LFTs 10/29/2012  . Acquired bilateral renal cysts   . Arthritis   . Bilateral bunions   . Constipation 10/16/2011  . DDD (degenerative disc disease)    spine w narrowing  . Dehydration, mild 12/26/2011  . Diverticulitis   . Exercise counseling 11/14/2011  . Fuch's endothelial dystrophy   . Guttate psoriasis   . Hematuria 08/24/2011  . Hemorrhoids   . History of chicken pox   . Hyperlipidemia   . Kidney stones   . Lipoma of arm 07/13/2012  . Lipoma of arm 10/07/2012   Left   . Mass of arm 07/13/2012   Has had them in past    . 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 Post-menopausal bleeding 10/08/2011  . Reflux 04/17/2012  . Renal insufficiency 01/11/2013  . Thrombocytopenia ([place]) 09/27/2011   Past Surgical History: Past Surgical History:  Procedure Laterality Date  . APPENDECTOMY    . CESAREAN SECTION    . CHOLECYSTECTOMY    . COLONOSCOPY  2005   (records here)[place] - diverticulosis and hemorrhoids  . CORNEAL TRANSPLANT  2013  . CYSTOSCOPY WITH RETROGRADE PYELOGRAM, URETEROSCOPY AND STENT PLACEMENT Right 10/22/2013   Procedure: CYSTOSCOPY WITH RETROGRADE PYELOGRAM, URETEROSCOPY ;  Surgeon: Raynelle Bring, MD;  Location: [facility];  Service: Urology;   Laterality: Right;  . LIPOMA EXCISION  2014   x 3, both arms  . TONSILLECTOMY     possible adenoids  . TOTAL ABDOMINAL HYSTERECTOMY     for fibroids  . TUBAL LIGATION    . WRIST FRACTURE SURGERY     right, titanium plate and pins   Social History: Social History   Socioeconomic History  . Marital status: Married    Spouse name: Not on file  . Number of children: Not on file  . Years of education: Not on file  . Highest education level: Not on file  Occupational History  . Not on file  Social Needs  . Financial resource strain: Not on file  . Food insecurity:    Worry: Not on file    Inability: Not on file  . Transportation needs:    Medical: Not on file    Non-medical: Not on file  Tobacco Use  . Smoking status: Never Smoker  . Smokeless tobacco: Never Used  Substance and Sexual Activity  . Alcohol use: No  . Drug use: No  . Sexual activity: Yes  Lifestyle  . Physical activity:    Days per week: Not on file    Minutes per session: Not on file  . Stress: Not on file  Relationships  . Social connections:    Talks on phone: Not on file    Gets together: Not on file    Attends  religious service: Not on file    Active member of club or organization: Not on file    Attends meetings of clubs or organizations: Not on file    Relationship status: Not on file  Other Topics Concern  . Not on file  Social History Narrative  . Not on file   Family History: Family History  Problem Relation Age of Onset  . Stroke Mother   . Heart disease Mother   . Kidney disease Father   . Breast cancer Sister 29  . Cancer Sister        ductal carcinoma breast- had it in 1999 and just found out its in the opposite breast  . Cancer Maternal Grandmother        brain tumor  . Breast cancer Paternal Grandmother        post menopausal   . Cancer Paternal Grandmother        breast  . Pneumonia Maternal Grandfather   . Colon cancer Maternal Aunt 12  . Cancer Maternal Aunt         colon  . Cancer Paternal Uncle        neck tumor  . Stomach cancer Maternal Aunt   . Cancer Maternal Aunt        stomach  . Kidney cancer Cousin   . Esophageal cancer Neg Hx   . Pancreatic cancer Neg Hx   . Rectal cancer Neg Hx    Allergies: Allergies  Allergen Reactions  . Augmentin [Amoxicillin-Pot Clavulanate]     Severe yeast infection-would prefer not to take.   Medications: See med rec.  Review of Systems: No fevers, chills, night sweats, weight loss, chest pain, or shortness of breath.   Objective:    General: Well Developed, well nourished, and in no acute distress.  Neuro: Alert and oriented x3, extra-ocular muscles intact, sensation grossly intact.  HEENT: Normocephalic, atraumatic, pupils equal round reactive to light, neck supple, no masses, no lymphadenopathy, thyroid nonpalpable.  Facial exam is unremarkable.  No visible twitching, vibrations.  Scalp examined, no seborrheic capitis, no dandruff, no lesions visualized, scalp is may be a bit dry. Skin: Warm and dry, no rashes. Cardiac: Regular rate and rhythm, no murmurs rubs or gallops, no lower extremity edema.  Respiratory: Clear to auscultation bilaterally. Not using accessory muscles, speaking in full sentences.  Impression and Recommendations:    Annual physical exam Checking some routine labs, pneumococcal 13 today. She had multiple questions about her scalp, platelets, diet. All fears were allayed.  Myalgia She had multiple questions about her scalp, platelets, diet. All fears were allayed.  I spent 25 minutes with this patient, greater than 50% was face-to-face time counseling regarding the above diagnoses ___________________________________________ Gwen Her. Dianah Field, M.D., ABFM., CAQSM. Primary Care and [place] Instructor of [place] [employer] [facility]

## 2017-10-11 NOTE — Assessment & Plan Note (Signed)
Checking some routine labs, pneumococcal 13 today. She had multiple questions about her scalp, platelets, diet. All fears were allayed.

## 2018-01-01 NOTE — Assessment & Plan Note (Signed)
She had multiple questions about her scalp, platelets, diet. All fears were allayed.

## 2018-01-24 DIAGNOSIS — H1851 Endothelial corneal dystrophy: Secondary | ICD-10-CM | POA: Diagnosis not present

## 2018-02-28 ENCOUNTER — Other Ambulatory Visit: Payer: Self-pay

## 2018-07-21 ENCOUNTER — Other Ambulatory Visit: Payer: Self-pay | Admitting: Sports Medicine

## 2018-07-21 DIAGNOSIS — Z1231 Encounter for screening mammogram for malignant neoplasm of breast: Secondary | ICD-10-CM

## 2018-08-15 ENCOUNTER — Ambulatory Visit (INDEPENDENT_AMBULATORY_CARE_PROVIDER_SITE_OTHER): Payer: Medicare Other | Admitting: Sports Medicine

## 2018-08-15 ENCOUNTER — Encounter: Payer: Self-pay | Admitting: Sports Medicine

## 2018-08-15 VITALS — BP 131/86 | HR 72 | Ht 60.0 in | Wt 120.0 lb

## 2018-08-15 DIAGNOSIS — Z23 Encounter for immunization: Secondary | ICD-10-CM | POA: Diagnosis not present

## 2018-08-15 DIAGNOSIS — M2240 Chondromalacia patellae, unspecified knee: Secondary | ICD-10-CM | POA: Diagnosis not present

## 2018-08-15 DIAGNOSIS — M722 Plantar fascial fibromatosis: Secondary | ICD-10-CM | POA: Diagnosis not present

## 2018-08-15 DIAGNOSIS — Z0000 Encounter for general adult medical examination without abnormal findings: Secondary | ICD-10-CM

## 2018-08-15 MED ORDER — MELOXICAM 15 MG PO TABS: ORAL_TABLET | ORAL | 3 refills | Status: DC

## 2018-08-15 NOTE — Progress Notes (Signed)
Subjective:    CC: Right foot pain  HPI: Joan Lopez is a pleasant 68 year old female, for the past several weeks she is had pain on the plantar aspect of right foot, worse after getting up from a seated position, moderate, persistent, localized without radiation.  I reviewed the past medical history, family history, social history, surgical history, and allergies today and no changes were needed.  Please see the problem list section below in epic for further details.  Past Medical History: Past Medical History:  Diagnosis Date  . Abnormal LFTs 10/29/2012  . Acquired bilateral renal cysts   . Arthritis   . Bilateral bunions   . Constipation 10/16/2011  . DDD (degenerative disc disease)    spine w narrowing  . Dehydration, mild 12/26/2011  . Diverticulitis   . Exercise counseling 11/14/2011  . Fuch's endothelial dystrophy   . Guttate psoriasis   . Hematuria 08/24/2011  . Hemorrhoids   . History of chicken pox   . Hyperlipidemia   . Kidney stones   . Lipoma of arm 07/13/2012  . Lipoma of arm 10/07/2012   Left   . Mass of arm 07/13/2012   Has had them in past    . 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 Post-menopausal bleeding 10/08/2011  . Reflux 04/17/2012  . Renal insufficiency 01/11/2013  . Thrombocytopenia ([place]) 09/27/2011   Past Surgical History: Past Surgical History:  Procedure Laterality Date  . APPENDECTOMY    . CESAREAN SECTION    . CHOLECYSTECTOMY    . COLONOSCOPY  2005   (records here)[place] - diverticulosis and hemorrhoids  . CORNEAL TRANSPLANT  2013  . CYSTOSCOPY WITH RETROGRADE PYELOGRAM, URETEROSCOPY AND STENT PLACEMENT Right 10/22/2013   Procedure: CYSTOSCOPY WITH RETROGRADE PYELOGRAM, URETEROSCOPY ;  Surgeon: Raynelle Bring, MD;  Location: [facility];  Service: Urology;  Laterality: Right;  . LIPOMA EXCISION  2014   x 3, both arms  . TONSILLECTOMY     possible adenoids  . TOTAL ABDOMINAL HYSTERECTOMY     for fibroids  . TUBAL LIGATION    . WRIST FRACTURE SURGERY      right, titanium plate and pins   Social History: Social History   Socioeconomic History  . Marital status: Married    Spouse name: Not on file  . Number of children: Not on file  . Years of education: Not on file  . Highest education level: Not on file  Occupational History  . Not on file  Social Needs  . Financial resource strain: Not on file  . Food insecurity:    Worry: Not on file    Inability: Not on file  . Transportation needs:    Medical: Not on file    Non-medical: Not on file  Tobacco Use  . Smoking status: Never Smoker  . Smokeless tobacco: Never Used  Substance and Sexual Activity  . Alcohol use: No  . Drug use: No  . Sexual activity: Yes  Lifestyle  . Physical activity:    Days per week: Not on file    Minutes per session: Not on file  . Stress: Not on file  Relationships  . Social connections:    Talks on phone: Not on file    Gets together: Not on file    Attends religious service: Not on file    Active member of club or organization: Not on file    Attends meetings of clubs or organizations: Not on file    Relationship status: Not on file  Other Topics Concern  . Not on file  Social History Narrative  . Not on file   Family History: Family History  Problem Relation Age of Onset  . Stroke Mother   . Heart disease Mother   . Kidney disease Father   . Breast cancer Sister 39  . Cancer Sister        ductal carcinoma breast- had it in 1999 and just found out its in the opposite breast  . Cancer Maternal Grandmother        brain tumor  . Breast cancer Paternal Grandmother        post menopausal   . Cancer Paternal Grandmother        breast  . Pneumonia Maternal Grandfather   . Colon cancer Maternal Aunt 38  . Cancer Maternal Aunt        colon  . Cancer Paternal Uncle        neck tumor  . Stomach cancer Maternal Aunt   . Cancer Maternal Aunt        stomach  . Kidney cancer Cousin   . Esophageal cancer Neg Hx   . Pancreatic cancer Neg  Hx   . Rectal cancer Neg Hx    Allergies: Allergies  Allergen Reactions  . Augmentin [Amoxicillin-Pot Clavulanate]     Severe yeast infection-would prefer not to take.   Medications: See med rec.  Review of Systems: No fevers, chills, night sweats, weight loss, chest pain, or shortness of breath.   Objective:    General: Well Developed, well nourished, and in no acute distress.  Neuro: Alert and oriented x3, extra-ocular muscles intact, sensation grossly intact.  HEENT: Normocephalic, atraumatic, pupils equal round reactive to light, neck supple, no masses, no lymphadenopathy, thyroid nonpalpable.  Skin: Warm and dry, no rashes. Cardiac: Regular rate and rhythm, no murmurs rubs or gallops, no lower extremity edema.  Respiratory: Clear to auscultation bilaterally. Not using accessory muscles, speaking in full sentences. Right foot: No visible erythema or swelling. Range of motion is full in all directions. Strength is 5/5 in all directions. No hallux valgus. No pes cavus or pes planus. No abnormal callus noted. No pain over the navicular prominence, or base of fifth metatarsal. Mild tenderness to palpation of the calcaneal insertion of plantar fascia. No pain at the Achilles insertion. No pain over the calcaneal bursa. No pain of the retrocalcaneal bursa. No tenderness to palpation over the tarsals, metatarsals, or phalanges. No hallux rigidus or limitus. No tenderness palpation over interphalangeal joints. No pain with compression of the metatarsal heads. Neurovascularly intact distally.  Impression and Recommendations:    Plantar fasciitis, right We will start very conservatively, meloxicam, rehab exercises. She is training for the [facility] which has been postponed until the fall. I would like to see her back in a month and if no better we will add scaphoid pads in her running shoes as well as an air heel brace.   Annual physical exam Pneumococcal 23 today.    ___________________________________________ Gwen Her. Dianah Field, M.D., ABFM., CAQSM. Primary Care and Sports Medicine [facility]  Adjunct Professor of [place] [employer] [facility] [employer]

## 2018-08-15 NOTE — Assessment & Plan Note (Signed)
Pneumococcal 23 today.

## 2018-08-15 NOTE — Assessment & Plan Note (Signed)
We will start very conservatively, meloxicam, rehab exercises. She is training for the [facility] which has been postponed until the fall. I would like to see her back in a month and if no better we will add scaphoid pads in her running shoes as well as an air heel brace.

## 2018-09-12 ENCOUNTER — Encounter: Payer: Self-pay | Admitting: Sports Medicine

## 2018-09-12 ENCOUNTER — Ambulatory Visit (INDEPENDENT_AMBULATORY_CARE_PROVIDER_SITE_OTHER): Payer: Medicare Other | Admitting: Sports Medicine

## 2018-09-12 DIAGNOSIS — M170 Bilateral primary osteoarthritis of knee: Secondary | ICD-10-CM | POA: Diagnosis not present

## 2018-09-12 DIAGNOSIS — M722 Plantar fascial fibromatosis: Secondary | ICD-10-CM

## 2018-09-12 NOTE — Progress Notes (Signed)
Subjective:    CC: Follow-up  HPI: This is a pleasant 68 year old female, she is a runner, we have been treating her for right plantar fasciitis, not better with rehab exercises, stretches and activity modification.  The [facility] was unfortunately canceled.  In addition she is been having pain in her left knee, anterior aspect, worse with sitting, squatting, going up and down stairs, she has known osteoarthritis that responded well to meloxicam in the past.  No mechanical symptoms, no trauma.  Moderate, persistent, localized without radiation.  I reviewed the past medical history, family history, social history, surgical history, and allergies today and no changes were needed.  Please see the problem list section below in epic for further details.  Past Medical History: Past Medical History:  Diagnosis Date  . Abnormal LFTs 10/29/2012  . Acquired bilateral renal cysts   . Arthritis   . Bilateral bunions   . Constipation 10/16/2011  . DDD (degenerative disc disease)    spine w narrowing  . Dehydration, mild 12/26/2011  . Diverticulitis   . Exercise counseling 11/14/2011  . Fuch's endothelial dystrophy   . Guttate psoriasis   . Hematuria 08/24/2011  . Hemorrhoids   . History of chicken pox   . Hyperlipidemia   . Kidney stones   . Lipoma of arm 07/13/2012  . Lipoma of arm 10/07/2012   Left   . Mass of arm 07/13/2012   Has had them in past    . 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 Post-menopausal bleeding 10/08/2011  . Reflux 04/17/2012  . Renal insufficiency 01/11/2013  . Thrombocytopenia ([place]) 09/27/2011   Past Surgical History: Past Surgical History:  Procedure Laterality Date  . APPENDECTOMY    . CESAREAN SECTION    . CHOLECYSTECTOMY    . COLONOSCOPY  2005   (records here)[place] - diverticulosis and hemorrhoids  . CORNEAL TRANSPLANT  2013  . CYSTOSCOPY WITH RETROGRADE PYELOGRAM, URETEROSCOPY AND STENT PLACEMENT Right 10/22/2013   Procedure: CYSTOSCOPY WITH RETROGRADE  PYELOGRAM, URETEROSCOPY ;  Surgeon: Raynelle Bring, MD;  Location: [facility];  Service: Urology;  Laterality: Right;  . LIPOMA EXCISION  2014   x 3, both arms  . TONSILLECTOMY     possible adenoids  . TOTAL ABDOMINAL HYSTERECTOMY     for fibroids  . TUBAL LIGATION    . WRIST FRACTURE SURGERY     right, titanium plate and pins   Social History: Social History   Socioeconomic History  . Marital status: Married    Spouse name: Not on file  . Number of children: Not on file  . Years of education: Not on file  . Highest education level: Not on file  Occupational History  . Not on file  Social Needs  . Financial resource strain: Not on file  . Food insecurity:    Worry: Not on file    Inability: Not on file  . Transportation needs:    Medical: Not on file    Non-medical: Not on file  Tobacco Use  . Smoking status: Never Smoker  . Smokeless tobacco: Never Used  Substance and Sexual Activity  . Alcohol use: No  . Drug use: No  . Sexual activity: Yes  Lifestyle  . Physical activity:    Days per week: Not on file    Minutes per session: Not on file  . Stress: Not on file  Relationships  . Social connections:    Talks on phone: Not on file    Gets together:  Not on file    Attends religious service: Not on file    Active member of club or organization: Not on file    Attends meetings of clubs or organizations: Not on file    Relationship status: Not on file  Other Topics Concern  . Not on file  Social History Narrative  . Not on file   Family History: Family History  Problem Relation Age of Onset  . Stroke Mother   . Heart disease Mother   . Kidney disease Father   . Breast cancer Sister 11  . Cancer Sister        ductal carcinoma breast- had it in 1999 and just found out its in the opposite breast  . Cancer Maternal Grandmother        brain tumor  . Breast cancer Paternal Grandmother        post menopausal   . Cancer Paternal Grandmother        breast  .  Pneumonia Maternal Grandfather   . Colon cancer Maternal Aunt 36  . Cancer Maternal Aunt        colon  . Cancer Paternal Uncle        neck tumor  . Stomach cancer Maternal Aunt   . Cancer Maternal Aunt        stomach  . Kidney cancer Cousin   . Esophageal cancer Neg Hx   . Pancreatic cancer Neg Hx   . Rectal cancer Neg Hx    Allergies: Allergies  Allergen Reactions  . Augmentin [Amoxicillin-Pot Clavulanate]     Severe yeast infection-would prefer not to take.   Medications: See med rec.  Review of Systems: No fevers, chills, night sweats, weight loss, chest pain, or shortness of breath.   Objective:    General: Well Developed, well nourished, and in no acute distress.  Neuro: Alert and oriented x3, extra-ocular muscles intact, sensation grossly intact.  HEENT: Normocephalic, atraumatic, pupils equal round reactive to light, neck supple, no masses, no lymphadenopathy, thyroid nonpalpable.  Skin: Warm and dry, no rashes. Cardiac: Regular rate and rhythm, no murmurs rubs or gallops, no lower extremity edema.  Respiratory: Clear to auscultation bilaterally. Not using accessory muscles, speaking in full sentences. Left knee: Normal to inspection with no erythema or effusion or obvious bony abnormalities. Palpation normal with no warmth or joint line tenderness or patellar tenderness or condyle tenderness. ROM normal in flexion and extension and lower leg rotation. Ligaments with solid consistent endpoints including ACL, PCL, LCL, MCL. Negative Mcmurray's and provocative meniscal tests. Non painful patellar compression. Patellar and quadriceps tendons unremarkable. Hamstring and quadriceps strength is normal.  Impression and Recommendations:    Primary osteoarthritis of both knees Mild recurrence of patellofemoral arthritic pain in the left knee. Adding rehab exercises, ice for 20 minutes 3-4 times a day, return as needed for this. She responded well to meloxicam back in  2017.  Plantar fasciitis, right Persistent discomfort, adding scaphoid pads, air heel brace, continue rehab, return to see me as needed. Unfortunately the [facility] was canceled.   ___________________________________________ Gwen Her. Dianah Field, M.D., ABFM., CAQSM. Primary Care and Sports Medicine [facility]  Adjunct Professor of [place] [employer] [facility] [employer] of Medicine

## 2018-09-12 NOTE — Assessment & Plan Note (Signed)
Persistent discomfort, adding scaphoid pads, air heel brace, continue rehab, return to see me as needed. Unfortunately the [facility] was canceled.

## 2018-09-12 NOTE — Assessment & Plan Note (Signed)
Mild recurrence of patellofemoral arthritic pain in the left knee. Adding rehab exercises, ice for 20 minutes 3-4 times a day, return as needed for this. She responded well to meloxicam back in 2017.

## 2018-09-19 ENCOUNTER — Ambulatory Visit
Admission: RE | Admit: 2018-09-19 | Discharge: 2018-09-19 | Disposition: A | Discharge status: Home / Self Care | Payer: Medicare Other | Source: Ambulatory Visit | Attending: Sports Medicine | Admitting: Sports Medicine

## 2018-09-19 ENCOUNTER — Other Ambulatory Visit: Payer: Self-pay

## 2018-09-19 DIAGNOSIS — Z1231 Encounter for screening mammogram for malignant neoplasm of breast: Secondary | ICD-10-CM | POA: Diagnosis not present

## 2018-09-22 ENCOUNTER — Other Ambulatory Visit: Payer: Self-pay | Admitting: Sports Medicine

## 2018-09-22 DIAGNOSIS — R928 Other abnormal and inconclusive findings on diagnostic imaging of breast: Secondary | ICD-10-CM

## 2018-09-25 ENCOUNTER — Other Ambulatory Visit: Payer: Self-pay

## 2018-09-25 ENCOUNTER — Ambulatory Visit
Admission: RE | Admit: 2018-09-25 | Discharge: 2018-09-25 | Disposition: A | Discharge status: Home / Self Care | Payer: Medicare Other | Source: Ambulatory Visit | Attending: Sports Medicine | Admitting: Sports Medicine

## 2018-09-25 ENCOUNTER — Other Ambulatory Visit: Payer: Self-pay | Admitting: Sports Medicine

## 2018-09-25 DIAGNOSIS — R928 Other abnormal and inconclusive findings on diagnostic imaging of breast: Secondary | ICD-10-CM

## 2018-09-25 DIAGNOSIS — N6312 Unspecified lump in the right breast, upper inner quadrant: Secondary | ICD-10-CM | POA: Diagnosis not present

## 2018-09-25 DIAGNOSIS — N6311 Unspecified lump in the right breast, upper outer quadrant: Secondary | ICD-10-CM | POA: Diagnosis not present

## 2018-09-25 DIAGNOSIS — N6310 Unspecified lump in the right breast, unspecified quadrant: Secondary | ICD-10-CM

## 2018-09-26 ENCOUNTER — Ambulatory Visit
Admission: RE | Admit: 2018-09-26 | Discharge: 2018-09-26 | Disposition: A | Discharge status: Home / Self Care | Payer: Medicare Other | Source: Ambulatory Visit | Attending: Sports Medicine | Admitting: Sports Medicine

## 2018-09-26 ENCOUNTER — Other Ambulatory Visit: Payer: Self-pay | Admitting: Sports Medicine

## 2018-09-26 ENCOUNTER — Other Ambulatory Visit: Payer: Self-pay

## 2018-09-26 DIAGNOSIS — D241 Benign neoplasm of right breast: Secondary | ICD-10-CM | POA: Diagnosis not present

## 2018-09-26 DIAGNOSIS — N6312 Unspecified lump in the right breast, upper inner quadrant: Secondary | ICD-10-CM | POA: Diagnosis not present

## 2018-09-26 DIAGNOSIS — R928 Other abnormal and inconclusive findings on diagnostic imaging of breast: Secondary | ICD-10-CM

## 2018-09-26 DIAGNOSIS — N6311 Unspecified lump in the right breast, upper outer quadrant: Secondary | ICD-10-CM | POA: Diagnosis not present

## 2018-09-26 DIAGNOSIS — C50211 Malignant neoplasm of upper-inner quadrant of right female breast: Secondary | ICD-10-CM | POA: Diagnosis not present

## 2018-09-29 ENCOUNTER — Encounter: Payer: Self-pay | Admitting: Sports Medicine

## 2018-09-29 DIAGNOSIS — Z170 Estrogen receptor positive status [ER+]: Secondary | ICD-10-CM | POA: Insufficient documentation

## 2018-09-29 DIAGNOSIS — C50211 Malignant neoplasm of upper-inner quadrant of right female breast: Secondary | ICD-10-CM | POA: Insufficient documentation

## 2018-10-06 ENCOUNTER — Other Ambulatory Visit: Payer: Self-pay | Admitting: General Surgery

## 2018-10-06 ENCOUNTER — Telehealth: Payer: Self-pay | Admitting: Oncology

## 2018-10-06 DIAGNOSIS — D696 Thrombocytopenia, unspecified: Secondary | ICD-10-CM | POA: Diagnosis not present

## 2018-10-06 DIAGNOSIS — Z90722 Acquired absence of ovaries, bilateral: Secondary | ICD-10-CM | POA: Diagnosis not present

## 2018-10-06 DIAGNOSIS — C50211 Malignant neoplasm of upper-inner quadrant of right female breast: Secondary | ICD-10-CM | POA: Diagnosis not present

## 2018-10-06 DIAGNOSIS — Z170 Estrogen receptor positive status [ER+]: Secondary | ICD-10-CM

## 2018-10-06 DIAGNOSIS — Z98891 History of uterine scar from previous surgery: Secondary | ICD-10-CM | POA: Diagnosis not present

## 2018-10-06 DIAGNOSIS — Z90710 Acquired absence of both cervix and uterus: Secondary | ICD-10-CM | POA: Diagnosis not present

## 2018-10-06 DIAGNOSIS — Z9049 Acquired absence of other specified parts of digestive tract: Secondary | ICD-10-CM | POA: Diagnosis not present

## 2018-10-06 NOTE — Telephone Encounter (Signed)
Received an urgent new patient breast referral from Dr. Dalbert Batman for breast cancer. Pt has been cld and scheduled to see Dr. Jana Hakim on 6/24 at 4pm. She's been made aware to arrive 15 minutes early. I provided her w/the location to our facility.

## 2018-10-07 ENCOUNTER — Other Ambulatory Visit: Payer: Self-pay | Admitting: General Surgery

## 2018-10-07 DIAGNOSIS — C50211 Malignant neoplasm of upper-inner quadrant of right female breast: Secondary | ICD-10-CM

## 2018-10-08 ENCOUNTER — Other Ambulatory Visit: Payer: Self-pay

## 2018-10-08 ENCOUNTER — Encounter: Payer: Self-pay | Admitting: Adult Health

## 2018-10-08 ENCOUNTER — Encounter: Payer: Self-pay | Admitting: *Deleted

## 2018-10-08 ENCOUNTER — Inpatient Hospital Stay: Payer: Medicare Other | Attending: Oncology | Admitting: Oncology

## 2018-10-08 ENCOUNTER — Encounter: Payer: Self-pay | Admitting: Oncology

## 2018-10-08 VITALS — BP 146/89 | HR 80 | Temp 98.4°F | Resp 18 | Ht 60.0 in | Wt 120.1 lb

## 2018-10-08 DIAGNOSIS — Z170 Estrogen receptor positive status [ER+]: Secondary | ICD-10-CM | POA: Diagnosis not present

## 2018-10-08 DIAGNOSIS — Z803 Family history of malignant neoplasm of breast: Secondary | ICD-10-CM | POA: Diagnosis not present

## 2018-10-08 DIAGNOSIS — D696 Thrombocytopenia, unspecified: Secondary | ICD-10-CM

## 2018-10-08 DIAGNOSIS — C50211 Malignant neoplasm of upper-inner quadrant of right female breast: Secondary | ICD-10-CM | POA: Insufficient documentation

## 2018-10-08 NOTE — Progress Notes (Signed)
°[facility]  °[phone] [phone]  ° ° ° °ID: Joan Lopez DOB: 10/05/1950  MR#: [id number]  CSN#:[id number] ° °Patient Care Team: °Thekkekandam, Thomas J, MD as PCP - General (Sports Medicine) °Ingram, Haywood, MD as Consulting Physician (General Surgery) °Magrinat, Gustav C, MD as Consulting Physician (Oncology) °Semchyshyn, Taras Michael, MD as Consulting Physician (Ophthalmology) °Gustav C Magrinat, MD °OTHER MD: ° °CHIEF COMPLAINT: Estrogen receptor positive breast cancer ° °CURRENT TREATMENT: Awaiting definitive surgery ° ° °HISTORY OF CURRENT ILLNESS: °Joan Lopez had routine screening mammography on 09/19/2018 showing a possible abnormality in the right breast. She underwent bilateral diagnostic mammography with tomography and right breast ultrasonography at [facility] on 09/25/2018 showing: breast density category B; suspicious right breast mass in the 1 o'clock position 9 cm from the nipple measuring 6 mm; indeterminate right breast mass in the 12 o'clock position 6 cm from the nipple measuring 9 mm; no suspicious right axillary lymphadenopathy. ° °Accordingly on 09/26/2018 she proceeded to biopsy of the right breast areas in question. 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 Prognostic indicators significant for: estrogen receptor, 100% positive and progesterone receptor, 10% positive, both with strong staining intensity. Proliferation marker Ki67 at 5%. HER2 equivocal by immunohistochemistry (2+), but negative by fluorescent in situ hybridization with a signals ratio 1.16 and number per cell 1.80. ° °Biopsy of the right breast 12 o'clock location from the same reports showed fibroadenoma, sclerotic, with no malignancy identified. ° °The patient's subsequent history is as detailed below. ° ° °INTERVAL HISTORY: °[name] was evaluated in the breast cancer  clinic on 10/08/2018. Her case was also presented at the multidisciplinary breast cancer conference on the same day. At that time a preliminary plan was proposed: Breast conserving surgery, antiestrogen therapy, adjuvant radiation. ° °She is scheduled to undergo right breast lumpectomy with right axillary lymph node biopsy on 10/24/2018 under Dr. Ingram. ° ° °REVIEW OF SYSTEMS: °Mirta reports she runs marathons, but with the pandemic, many of these have been delayed or cancelled. There were no specific symptoms leading to the original mammogram, which was routinely scheduled. The patient denies unusual headaches, visual changes, nausea, vomiting, stiff neck, dizziness, or gait imbalance. There has been no cough, phlegm production, or pleurisy, no chest pain or pressure, and no change in bowel or bladder habits. The patient denies fever, rash, bleeding, unexplained fatigue or unexplained weight loss. A detailed review of systems was otherwise entirely negative. ° ° °PAST MEDICAL HISTORY: °Past Medical History:  °Diagnosis Date  °• Abnormal LFTs 10/29/2012  °• Acquired bilateral renal cysts   °• Arthritis   °• Bilateral bunions   °• Constipation 10/16/2011  °• DDD (degenerative disc disease)   ° spine w narrowing  °• Dehydration, mild 12/26/2011  °• Diverticulitis   °• Exercise counseling 11/14/2011  °• Fuch's endothelial dystrophy   °• Guttate psoriasis   °• Hematuria 08/24/2011  °• Hemorrhoids   °• History of chicken pox   °• Hyperlipidemia   °• Kidney stones   °• Lipoma of arm 07/13/2012  °• Lipoma of arm 10/07/2012  ° Left   °• Mass of arm 07/13/2012  ° Has had them in past    °• 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  °• Post-menopausal bleeding 10/08/2011  °• Reflux 04/17/2012  °• Renal insufficiency 01/11/2013  °• Thrombocytopenia (HCC) 09/27/2011  ° ° °PAST SURGICAL HISTORY: °Past Surgical History:  °Procedure   Laterality Date  °• APPENDECTOMY  1986  °• CESAREAN SECTION    °• CHOLECYSTECTOMY    °• COLONOSCOPY  2005  ° (records  here)[place] - diverticulosis and hemorrhoids  °• CORNEAL TRANSPLANT  2013  °• CYSTOSCOPY WITH RETROGRADE PYELOGRAM, URETEROSCOPY AND STENT PLACEMENT Right 10/22/2013  ° Procedure: CYSTOSCOPY WITH RETROGRADE PYELOGRAM, URETEROSCOPY ;  Surgeon: Lester Borden, MD;  Location: [facility];  Service: Urology;  Laterality: Right;  °• LIPOMA EXCISION  2014  ° x 3, both arms  °• TONSILLECTOMY    ° possible adenoids  °• TOTAL ABDOMINAL HYSTERECTOMY  1991  ° for fibroids  °• TUBAL LIGATION    °• WRIST FRACTURE SURGERY    ° right, titanium plate and pins  ° ° °FAMILY HISTORY: °Family History  °Problem Relation Age of Onset  °• Stroke Mother   °• Heart disease Mother   °• Hypertension Mother   °• Kidney disease Father   °• Breast cancer Sister 56  °• Cancer Sister   °     ductal carcinoma breast- had it in 1999 and just found out its in the opposite breast  °• Cancer Maternal Grandmother   °     brain tumor  °• Breast cancer Paternal Grandmother   °     post menopausal   °• Cancer Paternal Grandmother   °     breast  °• Pneumonia Maternal Grandfather   °• Colon cancer Maternal Aunt 50  °• Cancer Maternal Aunt   °     colon  °• Cancer Paternal Uncle   °     neck tumor  °• Stomach cancer Maternal Aunt   °• Cancer Maternal Aunt   °     stomach  °• Kidney cancer Cousin   °• Esophageal cancer Neg Hx   °• Pancreatic cancer Neg Hx   °• Rectal cancer Neg Hx   ° °Patient's father was 70 years old when he died from kidney disease. Patient's mother died from old age at age 96, possibly something related to her colon.  The patient has 1 sister, no brothers.  Patient has a family history of breast cancer in her sister, diagnosed in her 50s with recurrence more than 5 years later, and in her paternal grandmother. Both were diagnosed following menopause. Her sister underwent bilateral mastectomies. The patient denies a family hx of ovarian, prostate, or pancreatic cancer.  She also notes: colon cancer in a maternal aunt at age 50; stomach cancer  in another maternal aunt; a neck tumor in a paternal uncle; a brain tumor in her maternal grandfather. ° ° °GYNECOLOGIC HISTORY:  °No LMP recorded. Patient has had a hysterectomy. °Menarche: 68 years old °Age at first live birth: 68 years old °GX P 1 °LMP 1991 °Contraceptive: IUD, remote °HRT estrogen approximately one month °Hysterectomy? Yes, 1991 °BSO? 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 She is married. Husband John ("Jack") is a retired police officer. He has significant health issues. The patient lives at home with husband Jack. Daughter Sally is a media specialist at [employer] and has two daughters, Eva and Caroline (the patient's granddaughters). Carolin is very interested in medicine.  The patient is not a church attender °  ° ADVANCED DIRECTIVES: Currently, husband Jack is automaticcaly her HCPOA.  ° °HEALTH MAINTENANCE: °Social History  ° °Tobacco Use  °• Smoking status: Never Smoker  °• Smokeless tobacco: Never Used  °  Used  Substance Use Topics   Alcohol use: No   Drug use: No     Colonoscopy: 09/2013, Dr. Carlean Purl, repeat 2025  PAP: 09/2011, normal  Bone density: 05/2014, -2.2   Allergies  Allergen Reactions   Augmentin [Amoxicillin-Pot Clavulanate]     Severe yeast infection-would prefer not to take.    Current Outpatient Medications  Medication Sig Dispense Refill   loteprednol (LOTEMAX) 0.5 % ophthalmic suspension Place 1 drop into both eyes as directed. Reported on 06/07/2015     No current facility-administered medications for this visit.     OBJECTIVE: Middle-aged white woman who appears well  Vitals:   10/08/18 1542  BP: (!) 146/89  Pulse: 80  Resp: 18  Temp: 98.4 F (36.9 C)  SpO2: 99%     Body mass index is 23.46 kg/m.   Wt Readings from Last 3 Encounters:  10/08/18 120 lb 1.6 oz (54.5 kg)  09/12/18 118 lb (53.5 kg)  08/15/18 120 lb (54.4 kg)      ECOG FS:0 -  Asymptomatic  Ocular: Sclerae unicteric, pupils round and equal Wearing a mask Lymphatic: No cervical or supraclavicular adenopathy Lungs no rales or rhonchi Heart regular rate and rhythm Abd soft, nontender, positive bowel sounds MSK no focal spinal tenderness, no joint edema Neuro: non-focal, well-oriented, appropriate affect Breasts: The right breast is status post recent biopsy.  There is no significant ecchymosis.  There is no palpable mass.  The left breast is benign.  Both axillae are benign.   LAB RESULTS:  CMP     Component Value Date/Time   NA 140 10/11/2017 1007   K 4.3 10/11/2017 1007   CL 103 10/11/2017 1007   CO2 29 10/11/2017 1007   GLUCOSE 105 (H) 10/11/2017 1007   BUN 18 10/11/2017 1007   CREATININE 0.76 10/11/2017 1007   CALCIUM 9.4 10/11/2017 1007   PROT 6.9 10/11/2017 1007   ALBUMIN 3.9 11/07/2015 1414   AST 18 10/11/2017 1007   ALT 9 10/11/2017 1007   ALKPHOS 45 11/07/2015 1414   BILITOT 0.9 10/11/2017 1007   GFRNONAA >90 10/22/2013 0810   GFRNONAA >89 04/02/2011 1037   GFRAA >90 10/22/2013 0810   GFRAA >89 04/02/2011 1037    No results found for: TOTALPROTELP, ALBUMINELP, A1GS, A2GS, BETS, BETA2SER, GAMS, MSPIKE, SPEI  No results found for: KPAFRELGTCHN, LAMBDASER, KAPLAMBRATIO  Lab Results  Component Value Date   WBC 3.3 (L) 10/11/2017   NEUTROABS 3.3 04/02/2011   HGB 14.2 10/11/2017   HCT 41.5 10/11/2017   MCV 87.9 10/11/2017   PLT 159 10/11/2017    _0 @  No results found for: LABCA2  No components found for: [id number]  No results for input(s): INR in the last 168 hours.  No results found for: LABCA2  No results found for: DJT701  No results found for: XBL390  No results found for: ZES923  No results found for: CA2729  No components found for: HGQUANT  No results found for: CEA1 / No results found for: CEA1   No results found for: AFPTUMOR  No results found for: CHROMOGRNA  No results found for:  PSA1  No visits with results within 3 Day(s) from this visit.  Latest known visit with results is:  Office Visit on 10/11/2017  Component Date Value Ref Range Status   WBC 10/11/2017 3.3* 3.8 - 10.8 Thousand/uL Final   RBC 10/11/2017 4.72  3.80 - 5.10 Million/uL Final   Hemoglobin 10/11/2017 14.2  11.7 - 15.5 g/dL Final  HCT 10/11/2017 41.5  35.0 - 45.0 % Final   MCV 10/11/2017 87.9  80.0 - 100.0 fL Final   MCH 10/11/2017 30.1  27.0 - 33.0 pg Final   MCHC 10/11/2017 34.2  32.0 - 36.0 g/dL Final   RDW 10/11/2017 12.8  11.0 - 15.0 % Final   Platelets 10/11/2017 159  140 - 400 Thousand/uL Final   MPV 10/11/2017 11.0  7.5 - 12.5 fL Final   Glucose, Bld 10/11/2017 105* 65 - 99 mg/dL Final   Comment: .            Fasting reference interval . For someone without known diabetes, a glucose value between 100 and 125 mg/dL is consistent with prediabetes and should be confirmed with a follow-up test. .    BUN 10/11/2017 18  7 - 25 mg/dL Final   Creat 10/11/2017 0.76  0.50 - 0.99 mg/dL Final   Comment: For patients >69 years of age, the reference limit for Creatinine is approximately 13% higher for people identified as African-American. .    BUN/Creatinine Ratio 59/16/3846 NOT APPLICABLE  6 - 22 (calc) Final   Sodium 10/11/2017 140  135 - 146 mmol/L Final   Potassium 10/11/2017 4.3  3.5 - 5.3 mmol/L Final   Chloride 10/11/2017 103  98 - 110 mmol/L Final   CO2 10/11/2017 29  20 - 32 mmol/L Final   Calcium 10/11/2017 9.4  8.6 - 10.4 mg/dL Final   Total Protein 10/11/2017 6.9  6.1 - 8.1 g/dL Final   Albumin 10/11/2017 4.3  3.6 - 5.1 g/dL Final   Globulin 10/11/2017 2.6  1.9 - 3.7 g/dL (calc) Final   AG Ratio 10/11/2017 1.7  1.0 - 2.5 (calc) Final   Total Bilirubin 10/11/2017 0.9  0.2 - 1.2 mg/dL Final   Alkaline phosphatase (APISO) 10/11/2017 53  33 - 130 U/L Final   AST 10/11/2017 18  10 - 35 U/L Final   ALT 10/11/2017 9  6 - 29 U/L Final   Cholesterol  10/11/2017 205* <200 mg/dL Final   HDL 10/11/2017 97  >50 mg/dL Final   Triglycerides 10/11/2017 55  <150 mg/dL Final   LDL Cholesterol (Calc) 10/11/2017 94  mg/dL (calc) Final   Comment: Reference range: <100 . Desirable range <100 mg/dL for primary prevention;   <70 mg/dL for patients with CHD or diabetic patients  with > or = 2 CHD risk factors. Marland Kitchen LDL-C is now calculated using the Martin-Hopkins  calculation, which is a validated novel method providing  better accuracy than the Friedewald equation in the  estimation of LDL-C.  Cresenciano Genre et al. Annamaria Helling. 6599;357(01): 2061-2068  (http://education.[facility].com/faq/FAQ164)    Total CHOL/HDL Ratio 10/11/2017 2.1  <5.0 (calc) Final   Non-HDL Cholesterol (Calc) 10/11/2017 108  <130 mg/dL (calc) Final   Comment: For patients with diabetes plus 1 major ASCVD risk  factor, treating to a non-HDL-C goal of <100 mg/dL  (LDL-C of <70 mg/dL) is considered a therapeutic  option.    TSH 10/11/2017 1.43  0.40 - 4.50 mIU/L Final    (this displays the last labs from the last 3 days)  No results found for: TOTALPROTELP, ALBUMINELP, A1GS, A2GS, BETS, BETA2SER, GAMS, MSPIKE, SPEI (this displays SPEP labs)  No results found for: KPAFRELGTCHN, LAMBDASER, KAPLAMBRATIO (kappa/lambda light chains)  No results found for: HGBA, HGBA2QUANT, HGBFQUANT, HGBSQUAN (Hemoglobinopathy evaluation)   No results found for: LDH  No results found for: IRON, TIBC, IRONPCTSAT (Iron and TIBC)  No results found for: FERRITIN  Urinalysis °   °Component Value Date/Time  ° COLORURINE YELLOW 10/07/2012 1331  ° APPEARANCEUR CLEAR 10/07/2012 1331  ° LABSPEC 1.020 10/07/2012 1331  ° PHURINE 6.0 10/07/2012 1331  ° GLUCOSEU NEG 10/07/2012 1331  ° GLUCOSEU NEG mg/dL 04/02/2007 2006  ° HGBUR NEG 10/07/2012 1331  ° HGBUR large 07/25/2009 1427  ° BILIRUBINUR neg 03/01/2015 1201  ° KETONESUR NEG 10/07/2012 1331  ° PROTEINUR neg 03/01/2015 1201  ° PROTEINUR NEG  10/07/2012 1331  ° UROBILINOGEN 0.2 03/01/2015 1201  ° UROBILINOGEN 0.2 10/07/2012 1331  ° NITRITE neg 03/01/2015 1201  ° NITRITE NEG 10/07/2012 1331  ° LEUKOCYTESUR Negative 03/01/2015 1201  ° ° ° °STUDIES: °Us Breast Ltd Uni Right Inc Axilla ° °Result Date: 09/25/2018 °CLINICAL DATA:  67-year-old female recalled from screening mammogram dated 09/19/2018 for a possible right breast mass. EXAM: DIGITAL DIAGNOSTIC RIGHT MAMMOGRAM WITH CAD AND TOMO ULTRASOUND RIGHT BREAST COMPARISON:  Previous exam(s). ACR Breast Density Category b: There are scattered areas of fibroglandular density. FINDINGS: There is a persistent spiculated, hyperdense mass in the central right breast at far posterior depth. Further evaluation with ultrasound was performed. Mammographic images were processed with CAD. Targeted ultrasound is performed, showing an irregular, hypoechoic mass with posterior acoustic shadowing at the 1 o'clock position 9 cm from the nipple. It measures 6 x 5 x 4 mm. There is some peripheral vascularity. This correlates well with the mammographic finding. An additional oval, circumscribed heterogeneous mass is identified at the 12 o'clock position 6 cm from the nipple. It measures 9 x 6 x 3 mm. There is no associated vascularity. Evaluation of the right axilla demonstrates no suspicious lymphadenopathy. IMPRESSION: 1. Suspicious right breast mass at the 1 o'clock position 9 cm from the nipple. This correlates with the screening mammographic findings. Recommendation is for ultrasound-guided biopsy. 2. Indeterminate right breast mass at the 12 o'clock position 6 cm from the nipple. This has a more benign appearance. However, recommendation is for ultrasound-guided biopsy for definitive tissue diagnosis. 3. No suspicious right axillary lymphadenopathy. RECOMMENDATION: Two area ultrasound-guided biopsy of the right breast. I have discussed the findings and recommendations with the patient. Results were also provided in  writing at the conclusion of the visit. If applicable, a reminder letter will be sent to the patient regarding the next appointment. BI-RADS CATEGORY  4: Suspicious. Electronically Signed   By: Serena  Chacko M.D.   On: 09/25/2018 11:55  ° °Mm Diag Breast Tomo Uni Right ° °Result Date: 09/25/2018 °CLINICAL DATA:  67-year-old female recalled from screening mammogram dated 09/19/2018 for a possible right breast mass. EXAM: DIGITAL DIAGNOSTIC RIGHT MAMMOGRAM WITH CAD AND TOMO ULTRASOUND RIGHT BREAST COMPARISON:  Previous exam(s). ACR Breast Density Category b: There are scattered areas of fibroglandular density. FINDINGS: There is a persistent spiculated, hyperdense mass in the central right breast at far posterior depth. Further evaluation with ultrasound was performed. Mammographic images were processed with CAD. Targeted ultrasound is performed, showing an irregular, hypoechoic mass with posterior acoustic shadowing at the 1 o'clock position 9 cm from the nipple. It measures 6 x 5 x 4 mm. There is some peripheral vascularity. This correlates well with the mammographic finding. An additional oval, circumscribed heterogeneous mass is identified at the 12 o'clock position 6 cm from the nipple. It measures 9 x 6 x 3 mm. There is no associated vascularity. Evaluation of the right axilla demonstrates no suspicious lymphadenopathy. IMPRESSION: 1. Suspicious right breast mass at the 1 o'clock position 9 cm from the nipple.   This correlates with the screening mammographic findings. Recommendation is for ultrasound-guided biopsy. 2. Indeterminate right breast mass at the 12 o'clock position 6 cm from the nipple. This has a more benign appearance. However, recommendation is for ultrasound-guided biopsy for definitive tissue diagnosis. 3. No suspicious right axillary lymphadenopathy. RECOMMENDATION: Two area ultrasound-guided biopsy of the right breast. I have discussed the findings and recommendations with the patient. Results  were also provided in writing at the conclusion of the visit. If applicable, a reminder letter will be sent to the patient regarding the next appointment. BI-RADS CATEGORY  4: Suspicious. Electronically Signed   By: Kristopher Oppenheim M.D.   On: 09/25/2018 11:55   Mm 3d Screen Breast Bilateral  Result Date: 09/19/2018 CLINICAL DATA:  Screening. EXAM: DIGITAL SCREENING BILATERAL MAMMOGRAM WITH TOMO AND CAD COMPARISON:  Previous exam(s). ACR Breast Density Category b: There are scattered areas of fibroglandular density. FINDINGS: In the right breast, a possible mass warrants further evaluation. In the left breast, no findings suspicious for malignancy. Images were processed with CAD. IMPRESSION: Further evaluation is suggested for possible mass in the right breast. RECOMMENDATION: Diagnostic mammogram and possibly ultrasound of the right breast. (Code:[id number]) The patient will be contacted regarding the findings, and additional imaging will be scheduled. BI-RADS CATEGORY  0: Incomplete. Need additional imaging evaluation and/or prior mammograms for comparison. Electronically Signed   By: Lajean Manes M.D.   On: 09/19/2018 11:37   Mm Clip Placement Right  Result Date: 09/26/2018 CLINICAL DATA:  Status post ultrasound-guided core needle biopsies of the recently demonstrated 6 mm mass in the 1 o'clock position of the right breast and 9 mm mass in the 12 o'clock position of the right breast. EXAM: DIAGNOSTIC RIGHT MAMMOGRAM POST ULTRASOUND BIOPSY X 2 COMPARISON:  Previous exam(s). FINDINGS: Mammographic images were obtained following ultrasound guided biopsy of the recently demonstrated 6 mm mass in the 1 o'clock position of the right breast and 9 mm mass in the 12 o'clock position of the right breast. These demonstrate the ribbon shaped and coil shaped biopsy marker clips in appropriate position. IMPRESSION: Appropriate clip deployment following left breast ultrasound-guided core needle biopsy x 2. Final Assessment:  Post Procedure Mammograms for Marker Placement Electronically Signed   By: Claudie Revering M.D.   On: 09/26/2018 12:34   [name] Rt Breast Bx W Loc Dev 1st Lesion Img Bx Spec US Guide  Addendum Date: 09/29/2018   ADDENDUM REPORT: 09/29/2018 14:30 ADDENDUM: Pathology revealed GRADE I INVASIVE DUCTAL CARCINOMA, DUCTAL CARCINOMA IN SITU of the RIGHT breast, 1 o'clock. This was found to be concordant by Dr. Claudie Revering. Pathology revealed FIBROADENOMA, SCLEROTIC of the RIGHT breast, 12 o'clock. This was found to be concordant by Dr. Claudie Revering. Pathology results were discussed with the patient by telephone. The patient reported doing well after the biopsy with tenderness at the site. Post biopsy instructions and care were reviewed and questions were answered. The patient was encouraged to call [facility] for any additional concerns. Surgical consultation has been arranged with Dr. Fanny Skates at [facility] on October 06, 2018. Pathology results reported by Stacie Acres, RN on 09/29/2018. Electronically Signed   By: Claudie Revering M.D.   On: 09/29/2018 14:30   Result Date: 09/29/2018 CLINICAL DATA:  6 mm mass suspicious for malignancy in the 1 o'clock position of the right breast at recent mammography and ultrasound. Additional 9 mm indeterminate mass in the 12 o'clock position of the right breast. EXAM:  ULTRASOUND GUIDED RIGHT BREAST CORE NEEDLE BIOPSY X 2 COMPARISON:  Previous exam(s). FINDINGS: I met with the patient and we discussed the procedure of ultrasound-guided biopsy, including benefits and alternatives. We discussed the high likelihood of a successful procedure. We discussed the risks of the procedure, including infection, bleeding, tissue injury, clip migration, and inadequate sampling. Informed written consent was given. The usual time-out protocol was performed immediately prior to the procedure. LESION #1: 6 MM MASS 1 O'CLOCK RIGHT BREAST Lesion quadrant: Upper inner  quadrant Using sterile technique and 1% Lidocaine as local anesthetic, under direct ultrasound visualization, a 12 gauge spring-loaded device was used to perform biopsy of the recently demonstrated 6 mm mass in the 1 o'clock position of the right breast, 9 cm from the nipple, using a caudal approach. At the conclusion of the procedure a ribbon shaped tissue marker clip was deployed into the biopsy cavity. Follow up 2 view mammogram was performed and dictated separately. SITE #2: 9 MM MASS 12 O'CLOCK RIGHT BREAST Using sterile technique and 1% Lidocaine as local anesthetic, under direct ultrasound visualization, a 12 gauge spring-loaded device was used to perform biopsy of the recently demonstrated 9 mm mass in the 12 o'clock position of the right breast, 6 cm from the nipple, using a caudal approach. At the conclusion of the procedure a coil shaped tissue marker clip was deployed into the biopsy cavity. Follow up 2 view mammogram was performed and dictated separately. IMPRESSION: Ultrasound guided biopsy of a 6 mm mass in the 1 o'clock position of the right breast and a 9 mm mass in the 12 o'clock position of the right breast. No apparent complications. Electronically Signed: By: Steven  Reid M.D. On: 09/26/2018 11:54  ° °Us Rt Breast Bx W Loc Dev Ea Add Lesion Img Bx Spec Us Guide ° °Addendum Date: 09/29/2018   °ADDENDUM REPORT: 09/29/2018 14:30 ADDENDUM: Pathology revealed GRADE I INVASIVE DUCTAL CARCINOMA, DUCTAL CARCINOMA IN SITU of the RIGHT breast, 1 o'clock. This was found to be concordant by Dr. Steven Reid. Pathology revealed FIBROADENOMA, SCLEROTIC of the RIGHT breast, 12 o'clock. This was found to be concordant by Dr. Steven Reid. Pathology results were discussed with the patient by telephone. The patient reported doing well after the biopsy with tenderness at the site. Post biopsy instructions and care were reviewed and questions were answered. The patient was encouraged to call [facility] for any additional concerns. Surgical consultation has been arranged with Dr. Haywood Ingram at [facility] on October 06, 2018. Pathology results reported by Susan Eaton, RN on 09/29/2018. Electronically Signed   By: Steven  Reid M.D.   On: 09/29/2018 14:30  ° °Result Date: 09/29/2018 °CLINICAL DATA:  6 mm mass suspicious for malignancy in the 1 o'clock position of the right breast at recent mammography and ultrasound. Additional 9 mm indeterminate mass in the 12 o'clock position of the right breast. EXAM: ULTRASOUND GUIDED RIGHT BREAST CORE NEEDLE BIOPSY X 2 COMPARISON:  Previous exam(s). FINDINGS: I met with the patient and we discussed the procedure of ultrasound-guided biopsy, including benefits and alternatives. We discussed the high likelihood of a successful procedure. We discussed the risks of the procedure, including infection, bleeding, tissue injury, clip migration, and inadequate sampling. Informed written consent was given. The usual time-out protocol was performed immediately prior to the procedure. LESION #1: 6 MM MASS 1 O'CLOCK RIGHT BREAST Lesion quadrant: Upper inner quadrant Using sterile technique and 1% Lidocaine as local anesthetic, under direct   ultrasound visualization, a 12 gauge spring-loaded device was used to perform biopsy of the recently demonstrated 6 mm mass in the 1 o'clock position of the right breast, 9 cm from the nipple, using a caudal approach. At the conclusion of the procedure a ribbon shaped tissue marker clip was deployed into the biopsy cavity. Follow up 2 view mammogram was performed and dictated separately. SITE #2: 9 MM MASS 12 O'CLOCK RIGHT BREAST Using sterile technique and 1% Lidocaine as local anesthetic, under direct ultrasound visualization, a 12 gauge spring-loaded device was used to perform biopsy of the recently demonstrated 9 mm mass in the 12 o'clock position of the right breast, 6 cm from the nipple, using a caudal approach. At the  conclusion of the procedure a coil shaped tissue marker clip was deployed into the biopsy cavity. Follow up 2 view mammogram was performed and dictated separately. IMPRESSION: Ultrasound guided biopsy of a 6 mm mass in the 1 o'clock position of the right breast and a 9 mm mass in the 12 o'clock position of the right breast. No apparent complications. Electronically Signed: By: Steven  Reid M.D. On: 09/26/2018 11:54  ° ° °ELIGIBLE FOR AVAILABLE RESEARCH PROTOCOL: no ° °ASSESSMENT: 67 y.o. [place], [place] woman status post right breast upper outer quadrant biopsy 09/26/2018 for a clinical T1BN0, stage Ia invasive ductal carcinoma, grade 1, estrogen and progesterone receptor positive, HER-2 negative, with an MIB-1 of 5% ° °(1) definitive surgery pending ° °(2) no chemotherapy planned, no Oncotype needed ° °(3) adjuvant radiation to follow ° °(4) antiestrogens ° °(5) genetics testing ° ° ° °PLAN: °I spent approximately 60 minutes face to face with Rogenia with more than 50% of that time spent in counseling and coordination of care. Specifically we reviewed the biology of the patient's diagnosis and the specifics of her situation.  We first reviewed the fact that cancer is not one disease but more than 100 different diseases and that it is important to keep them separate-- otherwise when friends and relatives discuss their own cancer experiences with Celestine confusion can result. Similarly we explained that if breast cancer spreads to the bone or liver, the patient would not have bone cancer or liver cancer, but breast cancer in the bone and breast cancer in the liver: one cancer in three places-- not 3 different cancers which otherwise would have to be treated in 3 different ways. ° °We discussed the difference between local and systemic therapy. In terms of loco-regional treatment, lumpectomy plus radiation is equivalent to mastectomy as far as survival is concerned. For this reason, and because the cosmetic results are  generally superior, we recommend breast conserving surgery. ° °We then discussed the rationale for systemic therapy. There is some risk that this cancer may have already spread to other parts of her body. Patients frequently ask at this point about bone scans, CAT scans and PET scans to find out if they have occult breast cancer somewhere else. The problem is that in early stage disease we are much more likely to find false positives then true cancers and this would expose the patient to unnecessary procedures as well as unnecessary radiation. Scans cannot answer the question the patient really would like to know, which is whether she has microscopic disease elsewhere in her body. For those reasons we do not recommend them. ° °Of course we would proceed to aggressive evaluation of any symptoms that might suggest metastatic disease, but that is not the case here. ° °Next we went   over the options for systemic therapy which are anti-estrogens, anti-HER-2 immunotherapy, and chemotherapy. Cynia does not meet criteria for anti-HER-2 immunotherapy. She is a good candidate for anti-estrogens. ° °The question of chemotherapy is more complicated. Chemotherapy is most effective in rapidly growing, aggressive tumors. It is much less effective in low-grade, slow growing cancers, like Destinee 's. For that reason and given the fact that the tumor is barely over 5 mm, we are now planning to obtain an Oncotype and we are not planning to proceed to chemotherapy. ° °Allison does meet criteria for genetics testing. In patients who carry a deleterious mutation [for example in a  BRCA gene], the risk of a new breast cancer developing in the future may be sufficiently great that the patient may choose bilateral mastectomies. However if she wishes to keep her breasts in that situation it is safe to do so. That would require intensified screening, which generally means not only yearly mammography but a yearly breast MRI as well.  ° °The  plan then is for surgery followed by radiation followed by antiestrogens.  The patient is very interested in quality of life issues and will want to continue to run marathons as long as possible so we will have to find an antiestrogen for her that does not interfere with those concerns. ° °Alyzah has a good understanding of the overall plan. She agrees with it. She knows the goal of treatment in her case is cure. She will call with any problems that may develop before her next visit here. ° ° °Gustav C Magrinat, MD   10/08/2018 5:05 PM °Medical Oncology and Hematology °[facility] °[address] °[place], [place] [address] °Tel. [phone]    Fax. [phone] ° ° °This document serves as a record of services personally performed by Gustav Magrinat, MD. It was created on his behalf by Katie Daubenspeck, a trained medical scribe. The creation of this record is based on the scribe's personal observations and the provider's statements to them.  ° °I, Gustav Magrinat MD, have reviewed the above documentation for accuracy and completeness, and I agree with the above. ° ° °

## 2018-10-09 ENCOUNTER — Other Ambulatory Visit: Payer: Self-pay

## 2018-10-09 ENCOUNTER — Telehealth: Payer: Self-pay | Admitting: Oncology

## 2018-10-09 ENCOUNTER — Ambulatory Visit
Admission: RE | Admit: 2018-10-09 | Discharge: 2018-10-09 | Disposition: A | Discharge status: Home / Self Care | Payer: Medicare Other | Source: Ambulatory Visit | Attending: Radiation Oncology | Admitting: Radiation Oncology

## 2018-10-09 ENCOUNTER — Encounter: Payer: Self-pay | Admitting: Radiation Oncology

## 2018-10-09 VITALS — BP 136/89 | HR 77 | Temp 99.1°F | Resp 18 | Ht 64.0 in | Wt 120.4 lb

## 2018-10-09 DIAGNOSIS — R945 Abnormal results of liver function studies: Secondary | ICD-10-CM | POA: Diagnosis not present

## 2018-10-09 DIAGNOSIS — Z809 Family history of malignant neoplasm, unspecified: Secondary | ICD-10-CM | POA: Insufficient documentation

## 2018-10-09 DIAGNOSIS — K219 Gastro-esophageal reflux disease without esophagitis: Secondary | ICD-10-CM | POA: Insufficient documentation

## 2018-10-09 DIAGNOSIS — Z803 Family history of malignant neoplasm of breast: Secondary | ICD-10-CM | POA: Insufficient documentation

## 2018-10-09 DIAGNOSIS — L404 Guttate psoriasis: Secondary | ICD-10-CM | POA: Diagnosis not present

## 2018-10-09 DIAGNOSIS — C50211 Malignant neoplasm of upper-inner quadrant of right female breast: Secondary | ICD-10-CM

## 2018-10-09 DIAGNOSIS — Z8051 Family history of malignant neoplasm of kidney: Secondary | ICD-10-CM | POA: Diagnosis not present

## 2018-10-09 DIAGNOSIS — E785 Hyperlipidemia, unspecified: Secondary | ICD-10-CM | POA: Diagnosis not present

## 2018-10-09 DIAGNOSIS — Z87442 Personal history of urinary calculi: Secondary | ICD-10-CM | POA: Diagnosis not present

## 2018-10-09 DIAGNOSIS — Z800 Family history of malignant neoplasm of digestive organs: Secondary | ICD-10-CM | POA: Diagnosis not present

## 2018-10-09 DIAGNOSIS — Z170 Estrogen receptor positive status [ER+]: Secondary | ICD-10-CM | POA: Diagnosis not present

## 2018-10-09 DIAGNOSIS — M1990 Unspecified osteoarthritis, unspecified site: Secondary | ICD-10-CM | POA: Insufficient documentation

## 2018-10-09 DIAGNOSIS — R319 Hematuria, unspecified: Secondary | ICD-10-CM | POA: Insufficient documentation

## 2018-10-09 NOTE — Telephone Encounter (Signed)
I talk with patient regarding schedule  

## 2018-10-09 NOTE — Patient Instructions (Signed)
Coronavirus (COVID-19) Are you at risk?  Are you at risk for the Coronavirus (COVID-19)?  To be considered HIGH RISK for Coronavirus (COVID-19), you have to meet the following criteria:  . Traveled to [place], [place], [name], [place] or [place]; or in the [place] to [place], [place], [place], or [place]; and have fever, cough, and shortness of breath within the last 2 weeks of travel OR . Been in close contact with a person diagnosed with COVID-19 within the last 2 weeks and have fever, cough, and shortness of breath . IF YOU DO NOT MEET THESE CRITERIA, YOU ARE CONSIDERED LOW RISK FOR COVID-19.  What to do if you are HIGH RISK for COVID-19?  . If you are having a medical emergency, call 911. . Seek medical care right away. Before you go to a doctor's office, urgent care or emergency department, call ahead and tell them about your recent travel, contact with someone diagnosed with COVID-19, and your symptoms. You should receive instructions from your physician's office regarding next steps of care.  . When you arrive at healthcare provider, tell the healthcare staff immediately you have returned from visiting [place], [place], [place], [place] or [name]; or traveled in the [place] to [place], [place], [place], or [place]; in the last two weeks or you have been in close contact with a person diagnosed with COVID-19 in the last 2 weeks.   . Tell the health care staff about your symptoms: fever, cough and shortness of breath. . After you have been seen by a medical provider, you will be either: o Tested for (COVID-19) and discharged home on quarantine except to seek medical care if symptoms worsen, and asked to  - Stay home and avoid contact with others until you get your results (4-5 days)  - Avoid travel on public transportation if possible (such as bus, train, or airplane) or o Sent to the Emergency Department by EMS for evaluation, COVID-19 testing, and possible  admission depending on your condition and test results.  What to do if you are LOW RISK for COVID-19?  Reduce your risk of any infection by using the same precautions used for avoiding the common cold or flu:  . Wash your hands often with soap and warm water for at least 20 seconds.  If soap and water are not readily available, use an alcohol-based hand sanitizer with at least 60% alcohol.  . If coughing or sneezing, cover your mouth and nose by coughing or sneezing into the elbow areas of your shirt or coat, into a tissue or into your sleeve (not your hands). . Avoid shaking hands with others and consider head nods or verbal greetings only. . Avoid touching your eyes, nose, or mouth with unwashed hands.  . Avoid close contact with people who are sick. . Avoid places or events with large numbers of people in one location, like concerts or sporting events. . Carefully consider travel plans you have or are making. . If you are planning any travel outside or inside the [place], visit the CDC's Travelers' Health webpage for the latest health notices. . If you have some symptoms but not all symptoms, continue to monitor at home and seek medical attention if your symptoms worsen. . If you are having a medical emergency, call 911.   ADDITIONAL HEALTHCARE OPTIONS FOR PATIENTS  [facility] / e-Visit: [email or web address]         [facility]: [phone]  [facility]: [phone]                   [facility]: [phone]   

## 2018-10-09 NOTE — Progress Notes (Signed)
Location of Breast Cancer: right breast mass in the 1 o'clock position 9 cm from the nipple measuring 6 mm  Histology per Pathology Report: 09/26/18:  Diagnosis 1. Breast, right, needle core biopsy, 1 o'clock - INVASIVE DUCTAL CARCINOMA - DUCTAL CARCINOMA IN SITU - SEE COMMENT 2. Breast, right, needle core biopsy, 12 o'clock - FIBROADENOMA, SCLEROTIC - NO MALIGNANCY IDENTIFIED  Receptor Status: ER(100%), PR (10%), Her2-neu (negative), Ki-(5%)  Did patient present with symptoms (if so, please note symptoms) or was this found on screening mammography?: Joan Lopez had routine screening mammography on 09/19/2018 showing a possible abnormality in the right breast. She underwent bilateral diagnostic mammography with tomography and right breast ultrasonography at [facility] on 09/25/2018 showing: breast density category B; suspicious right breast mass in the 1 o'clock position 9 cm from the nipple measuring 6 mm; indeterminate right breast mass in the 12 o'clock position 6 cm from the nipple measuring 9 mm; no suspicious right axillary lymphadenopathy.  Accordingly on 09/26/2018 she proceeded to biopsy of the right breast areas in question. 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 Prognostic indicators significant for: estrogen receptor, 100% positive and progesterone receptor, 10% positive, both with strong staining intensity. Proliferation marker Ki67 at 5%. HER2 equivocal by immunohistochemistry (2+), but negative by fluorescent in situ hybridization with a signals ratio 1.16 and number per cell 1.80.  Biopsy of the right breast 12 o'clock location from the same reports showed fibroadenoma, sclerotic, with no malignancy identified.  Past/Anticipated interventions by surgeon, if any:   She is scheduled to undergo right breast lumpectomy with right axillary lymph node biopsy on 10/24/2018  under Dr. Ingram.  Past/Anticipated interventions by medical oncology, if any: Chemotherapy Per Dr. Magrinat 10/08/18:  ASSESSMENT: 68 y.o. [place], [place] woman status post right breast upper outer quadrant biopsy 09/26/2018 for a clinical T1BN0, stage Ia invasive ductal carcinoma, grade 1, estrogen and progesterone receptor positive, HER-2 negative, with an MIB-1 of 5%  (1) definitive surgery pending  (2) no chemotherapy planned, no Oncotype needed  (3) adjuvant radiation to follow  (4) antiestrogens  (5) genetics testing  Lymphedema issues, if any:  Pt has not had surgery at this time.  Pain issues, if any: Pt denies c/o pain.  SAFETY ISSUES:  Prior radiation? No  Pacemaker/ICD? No  Possible current pregnancy? No  Is the patient on methotrexate? No  Current Complaints / other details:  Pt presents today for initial consult with Dr. Kinard for Radiation Oncology.   BP 136/89 (BP Location: Left Arm, Patient Position: Sitting)   Pulse 77   Temp 99.1 F (37.3 C) (Temporal)   Resp 18   Ht 5' 4" (1.626 m)   Wt 120 lb 6 oz (54.6 kg)   SpO2 98%   BMI 20.66 kg/m   Wt Readings from Last 3 Encounters:  10/09/18 120 lb 6 oz (54.6 kg)  10/08/18 120 lb 1.6 oz (54.5 kg)  09/12/18 118 lb (53.5 kg)       Jill W Pfefferkorn, RN 10/09/2018,2:54 PM    

## 2018-10-09 NOTE — Progress Notes (Signed)
Radiation Oncology         [phone] ________________________________  Initial Outpatient Consultation  Name: Joan Lopez MRN: [id number]  Date: 10/09/2018  DOB: 01/20/1951  [id number], Gwen Her, MD  Fanny Skates, MD   REFERRING PHYSICIAN: Fanny Skates, MD  DIAGNOSIS: The encounter diagnosis was Malignant neoplasm of upper-inner quadrant of right breast in female, estrogen receptor positive ([place]).  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 female who is here today at the request of Dr. Dalbert Batman for her diagnosis of breast cancer. She was found to have right breast abnormality on bilateral screening mammogram from 09/19/18. Diagnostic mammogram and ultrasound showed a suspicious right breast mass at the 1:00 position, 9 cm from the nipple. There was also an indeterminate right breast mass at the 12:00 position, 6 cm from the nipple, with a more benign appearance. No suspicious right axillary lymphadenopathy. Ultrasound-guided biopsy of the 1:00 mass on 09/26/18 revealed grade 1 invasive ductal carcinoma, measuring 0.5 cm, with ductal carcinoma in situ. Prognostic markers: ER 100%, PR 10%, Ki-67 5%, and Her2 negative. Biopsy of the indeterminate 12:00 mass revealed sclerotic fibroadenoma; no malignancy identified.  She met with Dr. Jana Hakim yesterday for treatment recommendation. She is scheduled for right breast lumpectomy with axillary sentinel lymph node biopsy on 10/24/18 with Dr. Dalbert Batman. No chemotherapy planned. The patient has kindly been referred today for discussion of adjuvant radiation treatment.  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    PAST SURGICAL HISTORY: Past Surgical History:  Procedure Laterality Date   APPENDECTOMY  1986   CESAREAN SECTION     CHOLECYSTECTOMY     COLONOSCOPY  2005   (records here)[place] - diverticulosis and hemorrhoids   CORNEAL TRANSPLANT  2013   CYSTOSCOPY WITH RETROGRADE PYELOGRAM, URETEROSCOPY AND STENT PLACEMENT Right 10/22/2013   Procedure: CYSTOSCOPY WITH RETROGRADE PYELOGRAM, URETEROSCOPY ;  Surgeon: Raynelle Bring, MD;  Location: [facility];  Service: Urology;  Laterality: Right;   LIPOMA EXCISION  2014   x 3, both arms   TONSILLECTOMY     possible adenoids   TOTAL ABDOMINAL HYSTERECTOMY  1991   for fibroids   TUBAL LIGATION     WRIST FRACTURE SURGERY     right, titanium plate and pins    FAMILY HISTORY: family history includes Breast cancer in her paternal grandmother; Breast cancer (age of onset: 20) in her sister; Cancer in her maternal aunt, maternal aunt, maternal grandmother, paternal grandmother, paternal uncle, and sister; Colon cancer (age of onset: 67) in her maternal aunt; Heart disease in her mother; Hypertension in her mother; Kidney cancer in her cousin; Kidney disease in her father; Pneumonia in her maternal grandfather; Stomach cancer in her maternal aunt; Stroke in her mother.  SOCIAL HISTORY:  reports that she has never smoked. She has never used smokeless tobacco. She reports that she does not drink alcohol or use drugs.  ALLERGIES: Augmentin [amoxicillin-pot clavulanate]  MEDICATIONS:  Current Outpatient Medications  Medication  Sig Dispense Refill   calcium carbonate (OSCAL) 1500 (600 Ca) MG TABS tablet Take 600 mg of elemental calcium by mouth daily with breakfast.     loteprednol (LOTEMAX) 0.5 % ophthalmic suspension Place 1 drop into both eyes as directed. Reported on 06/07/2015     No current facility-administered medications for this encounter.     REVIEW OF SYSTEMS:  REVIEW OF SYSTEMS: A 10+ POINT REVIEW OF SYSTEMS WAS OBTAINED including neurology, dermatology, psychiatry, cardiac, respiratory, lymph, extremities, GI, GU, musculoskeletal, constitutional, reproductive, HEENT. All pertinent positives are noted in the HPI. All others are negative.   PHYSICAL EXAM:  height is 5' 4"  (1.626 m) and weight is 120 lb 6 oz (54.6 kg). Her temporal temperature is 99.1 F (37.3 C). Her blood pressure is 136/89 and her pulse is 77. Her respiration is 18 and oxygen saturation is 98%.   General: Alert and oriented, in no acute distress HEENT: Head is normocephalic. Extraocular movements are intact. Oropharynx is clear. Neck: Neck is supple, no palpable cervical or supraclavicular lymphadenopathy. Heart: Regular in rate and rhythm with no murmurs, rubs, or gallops. Chest: Clear to auscultation bilaterally, with no rhonchi, wheezes, or rales. Abdomen: Soft, nontender, nondistended, with no rigidity or guarding. Extremities: No cyanosis or edema. Lymphatics: see Neck Exam Skin: No concerning lesions. Musculoskeletal: symmetric strength and muscle tone throughout. Neurologic: Cranial nerves II through XII are grossly intact. No obvious focalities. Speech is fluent. Coordination is intact. Psychiatric: Judgment and insight are intact. Affect is appropriate.  Breast Exam Left Breast: No palpable mass, nipple discharge or bleeding. Right Breast: Bruising in the UIQ. No palpable mass, nipple discharge or bleeding.   ECOG = 0  LABORATORY DATA:  Lab Results  Component Value Date   WBC 3.3 (L) 10/11/2017   HGB 14.2  10/11/2017   HCT 41.5 10/11/2017   MCV 87.9 10/11/2017   PLT 159 10/11/2017   NEUTROABS 3.3 04/02/2011   Lab Results  Component Value Date   NA 140 10/11/2017   K 4.3 10/11/2017   CL 103 10/11/2017   CO2 29 10/11/2017   GLUCOSE 105 (H) 10/11/2017   CREATININE 0.76 10/11/2017   CALCIUM 9.4 10/11/2017      RADIOGRAPHY: US Breast Ltd Uni Right Inc Axilla  Result Date: 09/25/2018 CLINICAL DATA:  68 year old female recalled from screening mammogram dated 09/19/2018 for a possible right breast mass. EXAM: DIGITAL DIAGNOSTIC RIGHT MAMMOGRAM WITH CAD AND TOMO ULTRASOUND RIGHT BREAST COMPARISON:  Previous exam(s). ACR Breast Density Category b: There are scattered areas of fibroglandular density. FINDINGS: There is a persistent spiculated, hyperdense mass in the central right breast at far posterior depth. Further evaluation with ultrasound was performed. Mammographic images were processed with CAD. Targeted ultrasound is performed, showing an irregular, hypoechoic mass with posterior acoustic shadowing at the 1 o'clock position 9 cm from the nipple. It measures 6 x 5 x 4 mm. There is some peripheral vascularity. This correlates well with the mammographic finding. An additional oval, circumscribed heterogeneous mass is identified at the 12 o'clock position 6 cm from the nipple. It measures 9 x 6 x 3 mm. There is no associated vascularity. Evaluation of the right axilla demonstrates no suspicious lymphadenopathy. IMPRESSION: 1. Suspicious right breast mass at the 1 o'clock position 9 cm from the nipple. This correlates with the screening mammographic findings. Recommendation is for ultrasound-guided biopsy. 2. Indeterminate right breast mass at the 12 o'clock position 6 cm from the nipple. This has a more benign appearance.  However, recommendation is for ultrasound-guided biopsy for definitive tissue diagnosis. 3. No suspicious right axillary lymphadenopathy. RECOMMENDATION: Two area ultrasound-guided  biopsy of the right breast. I have discussed the findings and recommendations with the patient. Results were also provided in writing at the conclusion of the visit. If applicable, a reminder letter will be sent to the patient regarding the next appointment. BI-RADS CATEGORY  4: Suspicious. Electronically Signed   By: Kristopher Oppenheim M.D.   On: 09/25/2018 11:55   Mm Diag Breast Tomo Uni Right  Result Date: 09/25/2018 CLINICAL DATA:  68 year old female recalled from screening mammogram dated 09/19/2018 for a possible right breast mass. EXAM: DIGITAL DIAGNOSTIC RIGHT MAMMOGRAM WITH CAD AND TOMO ULTRASOUND RIGHT BREAST COMPARISON:  Previous exam(s). ACR Breast Density Category b: There are scattered areas of fibroglandular density. FINDINGS: There is a persistent spiculated, hyperdense mass in the central right breast at far posterior depth. Further evaluation with ultrasound was performed. Mammographic images were processed with CAD. Targeted ultrasound is performed, showing an irregular, hypoechoic mass with posterior acoustic shadowing at the 1 o'clock position 9 cm from the nipple. It measures 6 x 5 x 4 mm. There is some peripheral vascularity. This correlates well with the mammographic finding. An additional oval, circumscribed heterogeneous mass is identified at the 12 o'clock position 6 cm from the nipple. It measures 9 x 6 x 3 mm. There is no associated vascularity. Evaluation of the right axilla demonstrates no suspicious lymphadenopathy. IMPRESSION: 1. Suspicious right breast mass at the 1 o'clock position 9 cm from the nipple. This correlates with the screening mammographic findings. Recommendation is for ultrasound-guided biopsy. 2. Indeterminate right breast mass at the 12 o'clock position 6 cm from the nipple. This has a more benign appearance. However, recommendation is for ultrasound-guided biopsy for definitive tissue diagnosis. 3. No suspicious right axillary lymphadenopathy. RECOMMENDATION: Two  area ultrasound-guided biopsy of the right breast. I have discussed the findings and recommendations with the patient. Results were also provided in writing at the conclusion of the visit. If applicable, a reminder letter will be sent to the patient regarding the next appointment. BI-RADS CATEGORY  4: Suspicious. Electronically Signed   By: Kristopher Oppenheim M.D.   On: 09/25/2018 11:55   Mm 3d Screen Breast Bilateral  Result Date: 09/19/2018 CLINICAL DATA:  Screening. EXAM: DIGITAL SCREENING BILATERAL MAMMOGRAM WITH TOMO AND CAD COMPARISON:  Previous exam(s). ACR Breast Density Category b: There are scattered areas of fibroglandular density. FINDINGS: In the right breast, a possible mass warrants further evaluation. In the left breast, no findings suspicious for malignancy. Images were processed with CAD. IMPRESSION: Further evaluation is suggested for possible mass in the right breast. RECOMMENDATION: Diagnostic mammogram and possibly ultrasound of the right breast. (Code:[id number]) The patient will be contacted regarding the findings, and additional imaging will be scheduled. BI-RADS CATEGORY  0: Incomplete. Need additional imaging evaluation and/or prior mammograms for comparison. Electronically Signed   By: Lajean Manes M.D.   On: 09/19/2018 11:37   Mm Clip Placement Right  Result Date: 09/26/2018 CLINICAL DATA:  Status post ultrasound-guided core needle biopsies of the recently demonstrated 6 mm mass in the 1 o'clock position of the right breast and 9 mm mass in the 12 o'clock position of the right breast. EXAM: DIAGNOSTIC RIGHT MAMMOGRAM POST ULTRASOUND BIOPSY X 2 COMPARISON:  Previous exam(s). FINDINGS: Mammographic images were obtained following ultrasound guided biopsy of the recently demonstrated 6 mm mass in the 1 o'clock position of the right breast and 9  mm mass in the 12 o'clock position of the right breast. These demonstrate the ribbon shaped and coil shaped biopsy marker clips in appropriate  position. IMPRESSION: Appropriate clip deployment following left breast ultrasound-guided core needle biopsy x 2. Final Assessment: Post Procedure Mammograms for Marker Placement Electronically Signed   By: Claudie Revering M.D.   On: 09/26/2018 12:34   [name] Rt Breast Bx W Loc Dev 1st Lesion Img Bx Spec US Guide  Addendum Date: 09/29/2018   ADDENDUM REPORT: 09/29/2018 14:30 ADDENDUM: Pathology revealed GRADE I INVASIVE DUCTAL CARCINOMA, DUCTAL CARCINOMA IN SITU of the RIGHT breast, 1 o'clock. This was found to be concordant by Dr. Claudie Revering. Pathology revealed FIBROADENOMA, SCLEROTIC of the RIGHT breast, 12 o'clock. This was found to be concordant by Dr. Claudie Revering. Pathology results were discussed with the patient by telephone. The patient reported doing well after the biopsy with tenderness at the site. Post biopsy instructions and care were reviewed and questions were answered. The patient was encouraged to call [facility] for any additional concerns. Surgical consultation has been arranged with Dr. Fanny Skates at [facility] on October 06, 2018. Pathology results reported by Stacie Acres, RN on 09/29/2018. Electronically Signed   By: Claudie Revering M.D.   On: 09/29/2018 14:30   Result Date: 09/29/2018 CLINICAL DATA:  6 mm mass suspicious for malignancy in the 1 o'clock position of the right breast at recent mammography and ultrasound. Additional 9 mm indeterminate mass in the 12 o'clock position of the right breast. EXAM: ULTRASOUND GUIDED RIGHT BREAST CORE NEEDLE BIOPSY X 2 COMPARISON:  Previous exam(s). FINDINGS: I met with the patient and we discussed the procedure of ultrasound-guided biopsy, including benefits and alternatives. We discussed the high likelihood of a successful procedure. We discussed the risks of the procedure, including infection, bleeding, tissue injury, clip migration, and inadequate sampling. Informed written consent was given. The usual time-out  protocol was performed immediately prior to the procedure. LESION #1: 6 MM MASS 1 O'CLOCK RIGHT BREAST Lesion quadrant: Upper inner quadrant Using sterile technique and 1% Lidocaine as local anesthetic, under direct ultrasound visualization, a 12 gauge spring-loaded device was used to perform biopsy of the recently demonstrated 6 mm mass in the 1 o'clock position of the right breast, 9 cm from the nipple, using a caudal approach. At the conclusion of the procedure a ribbon shaped tissue marker clip was deployed into the biopsy cavity. Follow up 2 view mammogram was performed and dictated separately. SITE #2: 9 MM MASS 12 O'CLOCK RIGHT BREAST Using sterile technique and 1% Lidocaine as local anesthetic, under direct ultrasound visualization, a 12 gauge spring-loaded device was used to perform biopsy of the recently demonstrated 9 mm mass in the 12 o'clock position of the right breast, 6 cm from the nipple, using a caudal approach. At the conclusion of the procedure a coil shaped tissue marker clip was deployed into the biopsy cavity. Follow up 2 view mammogram was performed and dictated separately. IMPRESSION: Ultrasound guided biopsy of a 6 mm mass in the 1 o'clock position of the right breast and a 9 mm mass in the 12 o'clock position of the right breast. No apparent complications. Electronically Signed: By: Claudie Revering M.D. On: 09/26/2018 11:54   [name] Rt Breast Bx W Loc Dev Ea Add Lesion Img Bx Spec US Guide  Addendum Date: 09/29/2018   ADDENDUM REPORT: 09/29/2018 14:30 ADDENDUM: Pathology revealed GRADE I INVASIVE DUCTAL CARCINOMA, DUCTAL CARCINOMA IN SITU  of the RIGHT breast, 1 o'clock. This was found to be concordant by Dr. Claudie Revering. Pathology revealed FIBROADENOMA, SCLEROTIC of the RIGHT breast, 12 o'clock. This was found to be concordant by Dr. Claudie Revering. Pathology results were discussed with the patient by telephone. The patient reported doing well after the biopsy with tenderness at the site. Post  biopsy instructions and care were reviewed and questions were answered. The patient was encouraged to call [facility] for any additional concerns. Surgical consultation has been arranged with Dr. Fanny Skates at [facility] on October 06, 2018. Pathology results reported by Stacie Acres, RN on 09/29/2018. Electronically Signed   By: Claudie Revering M.D.   On: 09/29/2018 14:30   Result Date: 09/29/2018 CLINICAL DATA:  6 mm mass suspicious for malignancy in the 1 o'clock position of the right breast at recent mammography and ultrasound. Additional 9 mm indeterminate mass in the 12 o'clock position of the right breast. EXAM: ULTRASOUND GUIDED RIGHT BREAST CORE NEEDLE BIOPSY X 2 COMPARISON:  Previous exam(s). FINDINGS: I met with the patient and we discussed the procedure of ultrasound-guided biopsy, including benefits and alternatives. We discussed the high likelihood of a successful procedure. We discussed the risks of the procedure, including infection, bleeding, tissue injury, clip migration, and inadequate sampling. Informed written consent was given. The usual time-out protocol was performed immediately prior to the procedure. LESION #1: 6 MM MASS 1 O'CLOCK RIGHT BREAST Lesion quadrant: Upper inner quadrant Using sterile technique and 1% Lidocaine as local anesthetic, under direct ultrasound visualization, a 12 gauge spring-loaded device was used to perform biopsy of the recently demonstrated 6 mm mass in the 1 o'clock position of the right breast, 9 cm from the nipple, using a caudal approach. At the conclusion of the procedure a ribbon shaped tissue marker clip was deployed into the biopsy cavity. Follow up 2 view mammogram was performed and dictated separately. SITE #2: 9 MM MASS 12 O'CLOCK RIGHT BREAST Using sterile technique and 1% Lidocaine as local anesthetic, under direct ultrasound visualization, a 12 gauge spring-loaded device was used to perform biopsy of the  recently demonstrated 9 mm mass in the 12 o'clock position of the right breast, 6 cm from the nipple, using a caudal approach. At the conclusion of the procedure a coil shaped tissue marker clip was deployed into the biopsy cavity. Follow up 2 view mammogram was performed and dictated separately. IMPRESSION: Ultrasound guided biopsy of a 6 mm mass in the 1 o'clock position of the right breast and a 9 mm mass in the 12 o'clock position of the right breast. No apparent complications. Electronically Signed: By: Claudie Revering M.D. 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 Patient will be an excellent candidate for breast conservation with radiation therapy directed to the right breast. We discussed the course of treatment, side effects, and potential toxicities associated with radiation therapy. She appears interested in this treatment approach.  PLAN: The patient will proceed with definitive surgery under the direction of Dr. Dalbert Batman on July 10th with lumpectomy and sentinel node procedure planned. The patient will then return for radiation planning on August 10th at 9:00 AM with treatments to begin approximately 5 weeks post-op. She would appear to be a good candidate for hypofractionated accelerated radiation therapy.     ------------------------------------------------  Blair Promise, PhD, MD  This document serves as  a record of services personally performed by Gery Pray, MD. It was created on his behalf by Rae Lips, a trained medical scribe. The creation of this record is based on the scribe's personal observations and the provider's statements to them. This document has been checked and approved by the attending provider.

## 2018-10-10 ENCOUNTER — Encounter (HOSPITAL_BASED_OUTPATIENT_CLINIC_OR_DEPARTMENT_OTHER): Payer: Self-pay | Admitting: *Deleted

## 2018-10-10 ENCOUNTER — Other Ambulatory Visit: Payer: Self-pay

## 2018-10-15 HISTORY — PX: BREAST LUMPECTOMY: SHX2

## 2018-10-19 NOTE — H&P (Signed)
Joan Lopez Location: [facility] Patient #: [id number] DOB: 03/25/1951 Married / Language: English / Race: White Female        History of Present Illness       This is a very pleasant 68 year old female, referred by Dr. Enrique Sack at the [facility] for surgical consultation regarding invasive ductal carcinoma right breast, upper inner quadrant. Dr.Thekkekandam is her PCP. Phylliss Bob. served as my chaperone throughout the encounter     This is a very healthy woman, runs marathons. No prior breast problems. Recent screening mammogram showed 2 findings. In the right breast at the 12 o'clock position there is a 9 mm mass, 6 cm from the nipple. This was biopsied and showed a benign fibroadenoma. In the right breast at the 1 o'clock position, there is a 6 mm mass, 9 cm from the nipple. This shows grade 1 invasive ductal carcinoma. ER 100%. PR 10%. HER-2 equivocal. Fish pending. Ki-67 2%.  Ultrasound of the right axilla is negative The clips are well positioned       Past history significant for vague history of thrombocytopenia. She states that more recently she's been told that her platelet count was normal. She's never had any unusual bleeding at surgery. Kidney stones. History TAH and BSO. Never took hormone replacement therapy. Laparoscopic cholecystectomy. Corneal transplant. C-section. Open appendectomy. Family history reveals sister had breast cancer at age 62. She recurred and had bilateral mastectomies. Paternal grandmother had breast cancer. Maternal grandmother had brain cancer. Father died of kidney cancer. Mother living has had a stroke and heart disease. Maternal aunt had colon cancer. There is no family history of genetic testing according to the patient. Social history reveals she is married lives in [place]. Has 1 daughter. Denies alcohol tobacco. Runs marathons and plans to run in a marathon in October.  She had lots of questions. We had a  very lengthy discussion. We talked about surgical management which could take the form of lumpectomy and sentinel lymph node biopsy and we compare that to mastectomy with or without reconstruction. She knows radiation therapy may play a role in her care depending on the extent of surgery and stage. She knows that final stage will be determined pathologically after surgery. She knows that [facility] is pending and she may or may not need chemotherapy. She does not want to delay her surgical care. I discussed multidisciplinary approach with her and she is also in favor of that She is interested in genetic testing, primarily because of offspring its relatives. She says she does not think he would influence her decisions about extent of surgery at her age  Plan: Refer to genetic counseling immediately Refer to medical oncology and radiation oncology immediately Obtain FISH results Present at breast conference tomorrow  Concurrently, she'll be scheduled for inject blue dye right breast, right breast lumpectomy with radioactive seed localization, right axillary sentinel lymph node biopsy. I do not necessarily planned to remove the fibroadenoma I discussed the indications, details, techniques, and numerous risk of the surgery with her. She is aware of the risk of bleeding, infection, reoperation for positive margins or multiple positive nodes, cosmetic deformity, chronic pain, shoulder disability, or swelling, sensory deficit under the arm. She understands all of these issues. All of her questions are answered. She agrees with this plan. She knows we can change the plan if further information comes to light. She knows she might need a Port-A-Cath if she needs Herceptin. Likewise, she knows chemotherapy decisions may wait  until final pathology is reviewed I encouraged her to have her husband call me if he would like further information.    Past Surgical History  Appendectomy  Breast Biopsy   Right. Cataract Surgery  Bilateral. Cesarean Section - 1  Gallbladder Surgery - Laparoscopic  Hysterectomy (not due to cancer) - Complete  Oral Surgery  Tonsillectomy   Diagnostic Studies History  Colonoscopy  1-5 years ago Mammogram  within last year Pap Smear  >5 years ago  Allergies  Augmentin *PENICILLINS*  Allergies Reconciled   Medication History Lotemax (0.5% Suspension, Ophthalmic) Active. Medications Reconciled  Social History  Alcohol use  Remotely quit alcohol use. Caffeine use  Carbonated beverages. No drug use  Tobacco use  Never smoker.  Family History  Alcohol Abuse  Father, Mother. Arthritis  Mother. Breast Cancer  Family Members In General, Sister. Cancer  Family Members In General. Cerebrovascular Accident  Mother. Colon Cancer  Family Members In General. Hypertension  Mother. Kidney Disease  Father.  Pregnancy / Birth History  Age at menarche  1 years. Age of menopause  42-50 Contraceptive History  Intrauterine device. Gravida  1 Irregular periods  Maternal age  36-25 Para  1  Other Problems  Arthritis  Back Pain  Breast Cancer  Cholelithiasis  Kidney Stone  Lump In Breast  Oophorectomy     Review of Systems  General Not Present- Appetite Loss, Chills, Fatigue, Fever, Night Sweats, Weight Gain and Weight Loss. Skin Present- Dryness. Not Present- Change in Wart/Mole, Hives, Jaundice, New Lesions, Non-Healing Wounds, Rash and Ulcer. HEENT Present- Hearing Loss. Not Present- Earache, Hoarseness, Nose Bleed, Oral Ulcers, Ringing in the Ears, Seasonal Allergies, Sinus Pain, Sore Throat, Visual Disturbances, Wears glasses/contact lenses and Yellow Eyes. Respiratory Not Present- Bloody sputum, Chronic Cough, Difficulty Breathing, Snoring and Wheezing. Breast Present- Breast Mass. Not Present- Breast Pain, Nipple Discharge and Skin Changes. Cardiovascular Not Present- Chest Pain, Difficulty Breathing  Lying Down, Leg Cramps, Palpitations, Rapid Heart Rate, Shortness of Breath and Swelling of Extremities. Gastrointestinal Not Present- Abdominal Pain, Bloating, Bloody Stool, Change in Bowel Habits, Chronic diarrhea, Constipation, Difficulty Swallowing, Excessive gas, Gets full quickly at meals, Hemorrhoids, Indigestion, Nausea, Rectal Pain and Vomiting. Female Genitourinary Not Present- Frequency, Nocturia, Painful Urination, Pelvic Pain and Urgency. Musculoskeletal Not Present- Back Pain, Joint Pain, Joint Stiffness, Muscle Pain, Muscle Weakness and Swelling of Extremities. Neurological Not Present- Decreased Memory, Fainting, Headaches, Numbness, Seizures, Tingling, Tremor, Trouble walking and Weakness. Psychiatric Not Present- Anxiety, Bipolar, Change in Sleep Pattern, Depression, Fearful and Frequent crying. Endocrine Not Present- Cold Intolerance, Excessive Hunger, Hair Changes, Heat Intolerance, Hot flashes and New Diabetes. Hematology Present- Easy Bruising. Not Present- Blood Thinners, Excessive bleeding, Gland problems, HIV and Persistent Infections.  Vitals Weight: 122.4 lb Height: 64in Body Surface Area: 1.59 m Body Mass Index: 21.01 kg/m  Temp.: 98.23F  Pulse: 101 (Regular)  BP: 140/82(Sitting, Left Arm, Standard)    Physical Exam  General Mental Status-Alert. General Appearance-Consistent with stated age. Hydration-Well hydrated. Voice-Normal.  Head and Neck Head-normocephalic, atraumatic with no lesions or palpable masses. Trachea-midline. Thyroid Gland Characteristics - normal size and consistency.  Eye Eyeball - Bilateral-Extraocular movements intact. Sclera/Conjunctiva - Bilateral-No scleral icterus.  Chest and Lung Exam Chest and lung exam reveals -quiet, even and easy respiratory effort with no use of accessory muscles and on auscultation, normal breath sounds, no adventitious sounds and normal vocal resonance. Inspection Chest  Wall - Normal. Back - normal.  Breast Note: Breasts are medium size. Soft. 2 biopsy  sites up her right breast. Maybe a little bit of thickening in the upper inner quadrant and a small bruise. No other mass or skin changes in either breast. No axillary adenopathy on either side.   Cardiovascular Cardiovascular examination reveals -normal heart sounds, regular rate and rhythm with no murmurs and normal pedal pulses bilaterally.  Abdomen Inspection Inspection of the abdomen reveals - No Hernias. Skin - Scar - Note: Well-healed Pfannenstiel incision. Well-healed right lower quadrant vertical scar from appendectomy. Well-healed trocar sites. Palpation/Percussion Palpation and Percussion of the abdomen reveal - Soft, Non Tender, No Rebound tenderness, No Rigidity (guarding) and No hepatosplenomegaly. Auscultation Auscultation of the abdomen reveals - Bowel sounds normal.  Neurologic Neurologic evaluation reveals -alert and oriented x 3 with no impairment of recent or remote memory. Mental Status-Normal.  Musculoskeletal Normal Exam - Left-Upper Extremity Strength Normal and Lower Extremity Strength Normal. Normal Exam - Right-Upper Extremity Strength Normal and Lower Extremity Strength Normal.  Lymphatic Head & Neck  General Head & Neck Lymphatics: Bilateral - Description - Normal. Axillary  General Axillary Region: Bilateral - Description - Normal. Tenderness - Non Tender. Femoral & Inguinal  Generalized Femoral & Inguinal Lymphatics: Bilateral - Description - Normal. Tenderness - Non Tender.    Assessment & Plan   PRIMARY CANCER OF UPPER INNER QUADRANT OF RIGHT FEMALE BREAST (C50.211)  Your recent imaging studies and biopsy showed a small, 6 mm invasive ductal carcinoma of the right breast, upper inner quadrant The cancer is strongly positive for estrogen receptor. HER-2 testing is equivocal and is pending. This could influence whether you get chemotherapy or  not. A second biopsy on the right showed a benign fibroadenoma and nothing needs to be done about that Ultrasound of your right axilla shows the lymph nodes to look normal The left breast looks normal  We had a long discussion about surgical management of your cancer as well as other aspects of uric care which may include antiestrogen therapy, chemotherapy, radiation therapy We have discussed surgical options including lumpectomy and sentinel node biopsy with radiation therapy. We have compared that to mastectomy with or without reconstruction. At this point in time I do not believe that there is a survival advantage to mastectomy You prefer lumpectomy and I think that you are a good candidate for that You are interested in expediting her care  We talked about genetic counseling and genetic testing and you are interested so you will be referred immediately for that You'll be immediately referred for consultation with a medical oncologist and the radiation oncologist. We discussed the rationale for that you will be discussed in our citywide tumor board conference tomorrow morning  It was your preference to go ahead and schedule surgery and at the same time make the other referrals. That is reasonable. We can always change our plan if new information influences our decision  you'll be scheduled for right breast lumpectomy with radioactive seed localization, right axillary sentinel lymph node biopsy I discussed the indications, techniques, and risk of that surgery with you in detail   HISTORY OF HYSTERECTOMY WITH BILATERAL OOPHORECTOMY (Z90.710)  HISTORY OF LAPAROSCOPIC CHOLECYSTECTOMY (Z90.49)  HISTORY OF C-SECTION (T41.962)  HISTORY OF APPENDECTOMY (Z90.49)  THROMBOCYTOPENIA (D69.6) Impression: This was borderline. She says she has been told this is normal now. No unusual bleeding with prior operations    [facility]. Dalbert Batman, M.D., [facility] General and  Minimally invasive Surgery Breast and Colorectal Surgery Office:   [phone] Pager:   [phone]

## 2018-10-21 ENCOUNTER — Encounter (HOSPITAL_BASED_OUTPATIENT_CLINIC_OR_DEPARTMENT_OTHER)
Admission: RE | Admit: 2018-10-21 | Discharge: 2018-10-21 | Disposition: A | Discharge status: Home / Self Care | Payer: Medicare Other | Source: Ambulatory Visit | Attending: General Surgery | Admitting: General Surgery

## 2018-10-21 ENCOUNTER — Other Ambulatory Visit: Payer: Self-pay

## 2018-10-21 ENCOUNTER — Other Ambulatory Visit (HOSPITAL_COMMUNITY)
Admission: RE | Admit: 2018-10-21 | Discharge: 2018-10-21 | Disposition: A | Discharge status: Home / Self Care | Payer: Medicare Other | Source: Ambulatory Visit | Attending: General Surgery | Admitting: General Surgery

## 2018-10-21 DIAGNOSIS — Z803 Family history of malignant neoplasm of breast: Secondary | ICD-10-CM | POA: Diagnosis not present

## 2018-10-21 DIAGNOSIS — Z1159 Encounter for screening for other viral diseases: Secondary | ICD-10-CM | POA: Diagnosis not present

## 2018-10-21 DIAGNOSIS — Z01812 Encounter for preprocedural laboratory examination: Secondary | ICD-10-CM | POA: Diagnosis not present

## 2018-10-21 DIAGNOSIS — C50911 Malignant neoplasm of unspecified site of right female breast: Secondary | ICD-10-CM | POA: Diagnosis not present

## 2018-10-21 DIAGNOSIS — M1990 Unspecified osteoarthritis, unspecified site: Secondary | ICD-10-CM | POA: Diagnosis not present

## 2018-10-21 DIAGNOSIS — Z880 Allergy status to penicillin: Secondary | ICD-10-CM | POA: Diagnosis not present

## 2018-10-21 DIAGNOSIS — C50211 Malignant neoplasm of upper-inner quadrant of right female breast: Secondary | ICD-10-CM | POA: Diagnosis not present

## 2018-10-21 DIAGNOSIS — Z170 Estrogen receptor positive status [ER+]: Secondary | ICD-10-CM | POA: Diagnosis not present

## 2018-10-21 DIAGNOSIS — Z881 Allergy status to other antibiotic agents status: Secondary | ICD-10-CM | POA: Diagnosis not present

## 2018-10-21 DIAGNOSIS — N289 Disorder of kidney and ureter, unspecified: Secondary | ICD-10-CM | POA: Diagnosis not present

## 2018-10-21 LAB — CBC WITH DIFFERENTIAL/PLATELET
Abs Immature Granulocytes: 0.01 10*3/uL (ref 0.00–0.07)
Basophils Absolute: 0 10*3/uL (ref 0.0–0.1)
Basophils Relative: 1 %
Eosinophils Absolute: 0.1 10*3/uL (ref 0.0–0.5)
Eosinophils Relative: 1 %
HCT: 41 % (ref 36.0–46.0)
Hemoglobin: 13.8 g/dL (ref 12.0–15.0)
Immature Granulocytes: 0 %
Lymphocytes Relative: 30 %
Lymphs Abs: 1.3 10*3/uL (ref 0.7–4.0)
MCH: 30.5 pg (ref 26.0–34.0)
MCHC: 33.7 g/dL (ref 30.0–36.0)
MCV: 90.5 fL (ref 80.0–100.0)
Monocytes Absolute: 0.5 10*3/uL (ref 0.1–1.0)
Monocytes Relative: 11 %
Neutro Abs: 2.4 10*3/uL (ref 1.7–7.7)
Neutrophils Relative %: 57 %
Platelets: 151 10*3/uL (ref 150–400)
RBC: 4.53 MIL/uL (ref 3.87–5.11)
RDW: 12 % (ref 11.5–15.5)
WBC: 4.3 10*3/uL (ref 4.0–10.5)
nRBC: 0 % (ref 0.0–0.2)

## 2018-10-21 LAB — COMPREHENSIVE METABOLIC PANEL
ALT: 20 U/L (ref 0–44)
AST: 28 U/L (ref 15–41)
Albumin: 3.9 g/dL (ref 3.5–5.0)
Alkaline Phosphatase: 54 U/L (ref 38–126)
Anion gap: 9 (ref 5–15)
BUN: 23 mg/dL (ref 8–23)
CO2: 29 mmol/L (ref 22–32)
Calcium: 9.7 mg/dL (ref 8.9–10.3)
Chloride: 103 mmol/L (ref 98–111)
Creatinine, Ser: 0.7 mg/dL (ref 0.44–1.00)
GFR calc Af Amer: >60 mL/min (ref >60)
GFR calc non Af Amer: >60 mL/min (ref >60)
Glucose, Bld: 102 mg/dL — ABNORMAL HIGH (ref 70–99)
Potassium: 4.9 mmol/L (ref 3.5–5.1)
Sodium: 141 mmol/L (ref 135–145)
Total Bilirubin: 0.7 mg/dL (ref 0.3–1.2)
Total Protein: 6.8 g/dL (ref 6.5–8.1)

## 2018-10-21 LAB — SARS CORONAVIRUS 2 (TAT 6-24 HRS): SARS Coronavirus 2: NEGATIVE

## 2018-10-21 NOTE — Progress Notes (Signed)
Ensure pre surgery drink given with instructions to complete by [facility], pt verbalized understanding.

## 2018-10-23 ENCOUNTER — Other Ambulatory Visit: Payer: Self-pay

## 2018-10-23 ENCOUNTER — Ambulatory Visit
Admission: RE | Admit: 2018-10-23 | Discharge: 2018-10-23 | Disposition: A | Discharge status: Home / Self Care | Payer: Medicare Other | Source: Ambulatory Visit | Attending: General Surgery | Admitting: General Surgery

## 2018-10-23 DIAGNOSIS — C50911 Malignant neoplasm of unspecified site of right female breast: Secondary | ICD-10-CM | POA: Diagnosis not present

## 2018-10-23 DIAGNOSIS — C50211 Malignant neoplasm of upper-inner quadrant of right female breast: Secondary | ICD-10-CM

## 2018-10-24 ENCOUNTER — Encounter (HOSPITAL_BASED_OUTPATIENT_CLINIC_OR_DEPARTMENT_OTHER)
Admission: RE | Disposition: A | Discharge status: Home / Self Care | Payer: Self-pay | Source: Home / Self Care | Attending: General Surgery

## 2018-10-24 ENCOUNTER — Ambulatory Visit (HOSPITAL_BASED_OUTPATIENT_CLINIC_OR_DEPARTMENT_OTHER)
Admission: RE | Admit: 2018-10-24 | Discharge: 2018-10-24 | Disposition: A | Discharge status: Home / Self Care | Payer: Medicare Other | Attending: General Surgery | Admitting: General Surgery

## 2018-10-24 ENCOUNTER — Ambulatory Visit
Admission: RE | Admit: 2018-10-24 | Discharge: 2018-10-24 | Disposition: A | Discharge status: Home / Self Care | Payer: Medicare Other | Source: Ambulatory Visit | Attending: General Surgery | Admitting: General Surgery

## 2018-10-24 ENCOUNTER — Ambulatory Visit (HOSPITAL_BASED_OUTPATIENT_CLINIC_OR_DEPARTMENT_OTHER): Payer: Medicare Other | Admitting: Anesthesiology

## 2018-10-24 ENCOUNTER — Other Ambulatory Visit: Payer: Self-pay

## 2018-10-24 ENCOUNTER — Ambulatory Visit (HOSPITAL_COMMUNITY)
Admission: RE | Admit: 2018-10-24 | Discharge: 2018-10-24 | Disposition: A | Discharge status: Home / Self Care | Payer: Medicare Other | Source: Ambulatory Visit | Attending: General Surgery | Admitting: General Surgery

## 2018-10-24 ENCOUNTER — Encounter (HOSPITAL_BASED_OUTPATIENT_CLINIC_OR_DEPARTMENT_OTHER): Payer: Self-pay | Admitting: *Deleted

## 2018-10-24 ENCOUNTER — Ambulatory Visit: Payer: Medicare Other | Admitting: Sports Medicine

## 2018-10-24 DIAGNOSIS — N289 Disorder of kidney and ureter, unspecified: Secondary | ICD-10-CM | POA: Diagnosis not present

## 2018-10-24 DIAGNOSIS — C50211 Malignant neoplasm of upper-inner quadrant of right female breast: Secondary | ICD-10-CM

## 2018-10-24 DIAGNOSIS — C50911 Malignant neoplasm of unspecified site of right female breast: Secondary | ICD-10-CM | POA: Diagnosis not present

## 2018-10-24 DIAGNOSIS — F411 Generalized anxiety disorder: Secondary | ICD-10-CM | POA: Diagnosis not present

## 2018-10-24 DIAGNOSIS — Z880 Allergy status to penicillin: Secondary | ICD-10-CM | POA: Insufficient documentation

## 2018-10-24 DIAGNOSIS — G8918 Other acute postprocedural pain: Secondary | ICD-10-CM | POA: Diagnosis not present

## 2018-10-24 DIAGNOSIS — Z803 Family history of malignant neoplasm of breast: Secondary | ICD-10-CM | POA: Insufficient documentation

## 2018-10-24 DIAGNOSIS — Z170 Estrogen receptor positive status [ER+]: Secondary | ICD-10-CM

## 2018-10-24 DIAGNOSIS — M1990 Unspecified osteoarthritis, unspecified site: Secondary | ICD-10-CM | POA: Diagnosis not present

## 2018-10-24 DIAGNOSIS — D0511 Intraductal carcinoma in situ of right breast: Secondary | ICD-10-CM | POA: Diagnosis not present

## 2018-10-24 DIAGNOSIS — Z881 Allergy status to other antibiotic agents status: Secondary | ICD-10-CM | POA: Insufficient documentation

## 2018-10-24 HISTORY — PX: BREAST LUMPECTOMY WITH RADIOACTIVE SEED AND SENTINEL LYMPH NODE BIOPSY: SHX6550

## 2018-10-24 SURGERY — BREAST LUMPECTOMY WITH RADIOACTIVE SEED AND SENTINEL LYMPH NODE BIOPSY
Anesthesia: General | Site: Breast | Laterality: Right

## 2018-10-24 MED ORDER — PROPOFOL 10 MG/ML IV BOLUS
INTRAVENOUS | Status: DC | PRN
  Administered 2018-10-24: 110 mg via INTRAVENOUS

## 2018-10-24 MED ORDER — CHLORHEXIDINE GLUCONATE CLOTH 2 % EX PADS: 6.0000 | MEDICATED_PAD | Freq: Once | CUTANEOUS | Status: DC

## 2018-10-24 MED ORDER — TECHNETIUM TC 99M SULFUR COLLOID FILTERED
1.0000 | Freq: Once | INTRAVENOUS | Status: AC | PRN
  Administered 2018-10-24: 10:00:00 1 via INTRADERMAL

## 2018-10-24 MED ORDER — GABAPENTIN 300 MG PO CAPS
300.0000 mg | ORAL_CAPSULE | ORAL | Status: AC
  Administered 2018-10-24: 300 mg via ORAL

## 2018-10-24 MED ORDER — METHYLENE BLUE 0.5 % INJ SOLN
INTRAVENOUS | Status: AC
  Filled 2018-10-24: qty 10

## 2018-10-24 MED ORDER — SODIUM CHLORIDE (PF) 0.9 % IJ SOLN
INTRAMUSCULAR | Status: AC
  Filled 2018-10-24: qty 10

## 2018-10-24 MED ORDER — MIDAZOLAM HCL 2 MG/2ML IJ SOLN
1.0000 mg | INTRAMUSCULAR | Status: DC | PRN
  Administered 2018-10-24 (×2): 1 mg via INTRAVENOUS

## 2018-10-24 MED ORDER — EPHEDRINE SULFATE 50 MG/ML IJ SOLN
INTRAMUSCULAR | Status: DC | PRN
  Administered 2018-10-24: 10 mg via INTRAVENOUS

## 2018-10-24 MED ORDER — LACTATED RINGERS IV SOLN
INTRAVENOUS | Status: DC
  Administered 2018-10-24: 09:00:00 via INTRAVENOUS

## 2018-10-24 MED ORDER — HYDROCODONE-ACETAMINOPHEN 5-325 MG PO TABS: 1.0000 | ORAL_TABLET | Freq: Four times a day (QID) | ORAL | 0 refills | Status: DC | PRN

## 2018-10-24 MED ORDER — OXYCODONE HCL 5 MG/5ML PO SOLN: 5.0000 mg | Freq: Once | ORAL | Status: DC | PRN

## 2018-10-24 MED ORDER — SODIUM CHLORIDE (PF) 0.9 % IJ SOLN
INTRAVENOUS | Status: DC | PRN
  Administered 2018-10-24: 5 mL via INTRAMUSCULAR

## 2018-10-24 MED ORDER — BUPIVACAINE HCL (PF) 0.5 % IJ SOLN
INTRAMUSCULAR | Status: DC | PRN
  Administered 2018-10-24: 15 mL

## 2018-10-24 MED ORDER — ONDANSETRON HCL 4 MG/2ML IJ SOLN
INTRAMUSCULAR | Status: AC
  Filled 2018-10-24: qty 2

## 2018-10-24 MED ORDER — SODIUM CHLORIDE 0.9% FLUSH: 3.0000 mL | Freq: Two times a day (BID) | INTRAVENOUS | Status: DC

## 2018-10-24 MED ORDER — FENTANYL CITRATE (PF) 100 MCG/2ML IJ SOLN
50.0000 ug | INTRAMUSCULAR | Status: DC | PRN
  Administered 2018-10-24 (×2): 50 ug via INTRAVENOUS

## 2018-10-24 MED ORDER — BUPIVACAINE-EPINEPHRINE 0.5% -1:200000 IJ SOLN
INTRAMUSCULAR | Status: DC | PRN
  Administered 2018-10-24: 17 mL

## 2018-10-24 MED ORDER — DEXAMETHASONE SODIUM PHOSPHATE 10 MG/ML IJ SOLN
INTRAMUSCULAR | Status: AC
  Filled 2018-10-24: qty 1

## 2018-10-24 MED ORDER — MIDAZOLAM HCL 2 MG/2ML IJ SOLN
INTRAMUSCULAR | Status: AC
  Filled 2018-10-24: qty 2

## 2018-10-24 MED ORDER — GABAPENTIN 300 MG PO CAPS
ORAL_CAPSULE | ORAL | Status: AC
  Filled 2018-10-24: qty 1

## 2018-10-24 MED ORDER — OXYCODONE HCL 5 MG PO TABS: 5.0000 mg | ORAL_TABLET | Freq: Once | ORAL | Status: DC | PRN

## 2018-10-24 MED ORDER — BUPIVACAINE LIPOSOME 1.3 % IJ SUSP
INTRAMUSCULAR | Status: DC | PRN
  Administered 2018-10-24: 10 mL

## 2018-10-24 MED ORDER — ACETAMINOPHEN 500 MG PO TABS
1000.0000 mg | ORAL_TABLET | ORAL | Status: AC
  Administered 2018-10-24: 1000 mg via ORAL

## 2018-10-24 MED ORDER — ACETAMINOPHEN 500 MG PO TABS
ORAL_TABLET | ORAL | Status: AC
  Filled 2018-10-24: qty 2

## 2018-10-24 MED ORDER — LIDOCAINE 2% (20 MG/ML) 5 ML SYRINGE
INTRAMUSCULAR | Status: DC | PRN
  Administered 2018-10-24: 40 mg via INTRAVENOUS

## 2018-10-24 MED ORDER — CEFAZOLIN SODIUM-DEXTROSE 2-4 GM/100ML-% IV SOLN
INTRAVENOUS | Status: AC
  Filled 2018-10-24: qty 100

## 2018-10-24 MED ORDER — FENTANYL CITRATE (PF) 100 MCG/2ML IJ SOLN
INTRAMUSCULAR | Status: AC
  Filled 2018-10-24: qty 2

## 2018-10-24 MED ORDER — CEFAZOLIN SODIUM-DEXTROSE 2-4 GM/100ML-% IV SOLN
2.0000 g | INTRAVENOUS | Status: AC
  Administered 2018-10-24: 2 g via INTRAVENOUS

## 2018-10-24 MED ORDER — FENTANYL CITRATE (PF) 100 MCG/2ML IJ SOLN: 25.0000 ug | INTRAMUSCULAR | Status: DC | PRN

## 2018-10-24 MED ORDER — ONDANSETRON HCL 4 MG/2ML IJ SOLN
INTRAMUSCULAR | Status: DC | PRN
  Administered 2018-10-24: 4 mg via INTRAVENOUS

## 2018-10-24 MED ORDER — ONDANSETRON HCL 4 MG/2ML IJ SOLN: 4.0000 mg | Freq: Once | INTRAMUSCULAR | Status: DC | PRN

## 2018-10-24 SURGICAL SUPPLY — 65 items
ADH SKN CLS APL DERMABOND .7 (GAUZE/BANDAGES/DRESSINGS) ×1
APL PRP STRL LF DISP 70% ISPRP (MISCELLANEOUS) ×1
APPLIER CLIP 11 MED OPEN (CLIP) ×2
APR CLP MED 11 20 MLT OPN (CLIP) ×1
BANDAGE ACE 6X5 VEL STRL LF (GAUZE/BANDAGES/DRESSINGS) IMPLANT
BINDER BREAST LRG (GAUZE/BANDAGES/DRESSINGS) ×1 IMPLANT
BINDER BREAST MEDIUM (GAUZE/BANDAGES/DRESSINGS) IMPLANT
BINDER BREAST XLRG (GAUZE/BANDAGES/DRESSINGS) IMPLANT
BINDER BREAST XXLRG (GAUZE/BANDAGES/DRESSINGS) IMPLANT
BLADE HEX COATED 2.75 (ELECTRODE) ×2 IMPLANT
BLADE SURG 15 STRL LF DISP TIS (BLADE) ×2 IMPLANT
BLADE SURG 15 STRL SS (BLADE) ×4
CANISTER SUCT 1200ML W/VALVE (MISCELLANEOUS) ×2 IMPLANT
CHLORAPREP W/TINT 26 (MISCELLANEOUS) ×2 IMPLANT
CLIP APPLIE 11 MED OPEN (CLIP) ×1 IMPLANT
COVER BACK TABLE REUSABLE LG (DRAPES) ×2 IMPLANT
COVER MAYO STAND REUSABLE (DRAPES) ×2 IMPLANT
COVER PROBE W GEL 5X96 (DRAPES) ×2 IMPLANT
COVER WAND RF STERILE (DRAPES) IMPLANT
DECANTER SPIKE VIAL GLASS SM (MISCELLANEOUS) IMPLANT
DERMABOND ADVANCED (GAUZE/BANDAGES/DRESSINGS) ×1
DERMABOND ADVANCED .7 DNX12 (GAUZE/BANDAGES/DRESSINGS) IMPLANT
DRAPE LAPAROSCOPIC ABDOMINAL (DRAPES) ×2 IMPLANT
DRAPE UTILITY XL STRL (DRAPES) ×2 IMPLANT
DRSG PAD ABDOMINAL 8X10 ST (GAUZE/BANDAGES/DRESSINGS) ×3 IMPLANT
ELECT REM PT RETURN 9FT ADLT (ELECTROSURGICAL) ×2
ELECTRODE REM PT RTRN 9FT ADLT (ELECTROSURGICAL) ×1 IMPLANT
GAUZE SPONGE 4X4 12PLY STRL (GAUZE/BANDAGES/DRESSINGS) ×1 IMPLANT
GAUZE SPONGE 4X4 12PLY STRL LF (GAUZE/BANDAGES/DRESSINGS) ×2 IMPLANT
GLOVE BIO SURGEON STRL SZ 6.5 (GLOVE) ×2 IMPLANT
GLOVE BIOGEL PI IND STRL 6.5 (GLOVE) IMPLANT
GLOVE BIOGEL PI INDICATOR 6.5 (GLOVE) ×1
GLOVE EUDERMIC 7 POWDERFREE (GLOVE) ×2 IMPLANT
GLOVE EXAM NITRILE MD LF STRL (GLOVE) ×1 IMPLANT
GOWN STRL REUS W/ TWL LRG LVL3 (GOWN DISPOSABLE) ×1 IMPLANT
GOWN STRL REUS W/ TWL XL LVL3 (GOWN DISPOSABLE) ×1 IMPLANT
GOWN STRL REUS W/TWL LRG LVL3 (GOWN DISPOSABLE) ×1 IMPLANT
GOWN STRL REUS W/TWL XL LVL3 (GOWN DISPOSABLE) ×2
ILLUMINATOR WAVEGUIDE N/F (MISCELLANEOUS) IMPLANT
KIT MARKER MARGIN INK (KITS) ×2 IMPLANT
LIGHT WAVEGUIDE WIDE FLAT (MISCELLANEOUS) IMPLANT
NDL HYPO 25X1 1.5 SAFETY (NEEDLE) ×2 IMPLANT
NDL SAFETY ECLIPSE 18X1.5 (NEEDLE) ×1 IMPLANT
NEEDLE HYPO 18GX1.5 SHARP (NEEDLE)
NEEDLE HYPO 25X1 1.5 SAFETY (NEEDLE) ×4 IMPLANT
NS IRRIG 1000ML POUR BTL (IV SOLUTION) ×2 IMPLANT
PACK BASIN DAY SURGERY FS (CUSTOM PROCEDURE TRAY) ×2 IMPLANT
PAD ALCOHOL SWAB (MISCELLANEOUS) ×2 IMPLANT
PENCIL BUTTON HOLSTER BLD 10FT (ELECTRODE) ×2 IMPLANT
SHEET MEDIUM DRAPE 40X70 STRL (DRAPES) ×2 IMPLANT
SLEEVE SCD COMPRESS KNEE MED (MISCELLANEOUS) ×2 IMPLANT
SPONGE LAP 18X18 RF (DISPOSABLE) IMPLANT
SPONGE LAP 4X18 RFD (DISPOSABLE) ×4 IMPLANT
SUT MNCRL AB 4-0 PS2 18 (SUTURE) ×4 IMPLANT
SUT SILK 2 0 SH (SUTURE) ×2 IMPLANT
SUT VIC AB 2-0 CT1 27 (SUTURE)
SUT VIC AB 2-0 CT1 TAPERPNT 27 (SUTURE) IMPLANT
SUT VIC AB 3-0 SH 27 (SUTURE)
SUT VIC AB 3-0 SH 27X BRD (SUTURE) IMPLANT
SUT VICRYL 3-0 CR8 SH (SUTURE) ×2 IMPLANT
SYR 10ML LL (SYRINGE) ×4 IMPLANT
TOWEL GREEN STERILE FF (TOWEL DISPOSABLE) ×3 IMPLANT
TRAY FAXITRON CT DISP (TRAY / TRAY PROCEDURE) ×2 IMPLANT
TUBE CONNECTING 20X1/4 (TUBING) ×2 IMPLANT
YANKAUER SUCT BULB TIP NO VENT (SUCTIONS) ×2 IMPLANT

## 2018-10-24 NOTE — Discharge Instructions (Signed)
[place] Office Phone Number [phone]  BREAST BIOPSY/ PARTIAL MASTECTOMY: POST OP INSTRUCTIONS  Always review your discharge instruction sheet given to you by the facility where your surgery was performed. **You had 1000 mg of Tylenol at 8:45am    IF YOU HAVE DISABILITY OR FAMILY LEAVE FORMS, YOU MUST BRING THEM TO THE OFFICE FOR PROCESSING.  DO NOT GIVE THEM TO YOUR DOCTOR.  1. A prescription for pain medication may be given to you upon discharge.  Take your pain medication as prescribed, if needed.  If narcotic pain medicine is not needed, then you may take acetaminophen (Tylenol) or ibuprofen (Advil) as needed. 2. Take your usually prescribed medications unless otherwise directed 3. If you need a refill on your pain medication, please contact your pharmacy.  They will contact our office to request authorization.  Prescriptions will not be filled after 5pm or on week-ends. 4. You should eat very light the first 24 hours after surgery, such as soup, crackers, pudding, etc.  Resume your normal diet the day after surgery. 5. Most patients will experience some swelling and bruising in the breast.  Ice packs and a good support bra will help.  Swelling and bruising can take several days to resolve.  6. It is common to experience some constipation if taking pain medication after surgery.  Increasing fluid intake and taking a stool softener will usually help or prevent this problem from occurring.  A mild laxative (Milk of Magnesia or Miralax) should be taken according to package directions if there are no bowel movements after 48 hours. 7. Unless discharge instructions indicate otherwise, you may remove your bandages 24-48 hours after surgery, and you may shower at that time.  You may have steri-strips (small skin tapes) in place directly over the incision.  These strips should be left on the skin for 7-10 days.  If your surgeon used skin glue on the incision, you may shower in 24  hours.  The glue will flake off over the next 2-3 weeks.  Any sutures or staples will be removed at the office during your follow-up visit. 8. ACTIVITIES:  You may resume regular daily activities (gradually increasing) beginning the next day.  Wearing a good support bra or sports bra minimizes pain and swelling.  You may have sexual intercourse when it is comfortable. a. You may drive when you no longer are taking prescription pain medication, you can comfortably wear a seatbelt, and you can safely maneuver your car and apply brakes. b. RETURN TO WORK:  ______________________________________________________________________________________ 9. You should see your doctor in the office for a follow-up appointment approximately two weeks after your surgery.  Your doctors nurse will typically make your follow-up appointment when she calls you with your pathology report.  Expect your pathology report 2-3 business days after your surgery.  You may call to check if you do not hear from [name] after three days. 10. OTHER INSTRUCTIONS: _______________________________________________________________________________________________ _____________________________________________________________________________________________________________________________________ _____________________________________________________________________________________________________________________________________ _____________________________________________________________________________________________________________________________________  WHEN TO CALL YOUR DOCTOR: 1. Fever over 101.0 2. Nausea and/or vomiting. 3. Extreme swelling or bruising. 4. Continued bleeding from incision. 5. Increased pain, redness, or drainage from the incision.  The clinic staff is available to answer your questions during regular business hours.  Please dont hesitate to call and ask to speak to one of the nurses for clinical concerns.  If you have a  medical emergency, go to the nearest emergency room or call 911.  A surgeon from [facility] is always on call at  the hospital.  For further questions, please visit [email or web address]              Managing Your Pain After Surgery Without Opioids    Thank you for participating in our program to help patients manage their pain after surgery without opioids. This is part of our effort to provide you with the best care possible, without exposing you or your family to the risk that opioids pose.  What pain can I expect after surgery? You can expect to have some pain after surgery. This is normal. The pain is typically worse the day after surgery, and quickly begins to get better. Many studies have found that many patients are able to manage their pain after surgery with Over-the-Counter (OTC) medications such as Tylenol and Motrin. If you have a condition that does not allow you to take Tylenol or Motrin, notify your surgical team.  How will I manage my pain? The best strategy for controlling your pain after surgery is around the clock pain control with Tylenol (acetaminophen) and Motrin (ibuprofen or Advil). Alternating these medications with each other allows you to maximize your pain control. In addition to Tylenol and Motrin, you can use heating pads or ice packs on your incisions to help reduce your pain.  How will I alternate your regular strength over-the-counter pain medication? You will take a dose of pain medication every three hours. ; Start by taking 650 mg of Tylenol (2 pills of 325 mg) ; 3 hours later take 600 mg of Motrin (3 pills of 200 mg) ; 3 hours after taking the Motrin take 650 mg of Tylenol ; 3 hours after that take 600 mg of Motrin.   - 1 -  See example - if your first dose of Tylenol is at 12:00 PM   12:00 PM Tylenol 650 mg (2 pills of 325 mg)  3:00 PM Motrin 600 mg (3 pills of 200 mg)  6:00 PM Tylenol 650 mg (2 pills of 325  mg)  9:00 PM Motrin 600 mg (3 pills of 200 mg)  Continue alternating every 3 hours   We recommend that you follow this schedule around-the-clock for at least 3 days after surgery, or until you feel that it is no longer needed. Use the table on the last page of this handout to keep track of the medications you are taking. Important: Do not take more than 3000mg  of Tylenol or 3200mg  of Motrin in a 24-hour period. Do not take ibuprofen/Motrin if you have a history of bleeding stomach ulcers, severe kidney disease, &/or actively taking a blood thinner  What if I still have pain? If you have pain that is not controlled with the over-the-counter pain medications (Tylenol and Motrin or Advil) you might have what we call breakthrough pain. You will receive a prescription for a small amount of an opioid pain medication such as Oxycodone, Tramadol, or Tylenol with Codeine. Use these opioid pills in the first 24 hours after surgery if you have breakthrough pain. Do not take more than 1 pill every 4-6 hours.  If you still have uncontrolled pain after using all opioid pills, don't hesitate to call our staff using the number provided. We will help make sure you are managing your pain in the best way possible, and if necessary, we can provide a prescription for additional pain medication.   Day 1    Time  Name of Medication Number of pills taken  Amount of Acetaminophen  Pain Level  Comments  AM PM       AM PM       AM PM       AM PM       AM PM       AM PM       AM PM       AM PM       Total Daily amount of Acetaminophen Do not take more than  3,000 mg per day      Day 2    Time  Name of Medication Number of pills taken  Amount of Acetaminophen  Pain Level   Comments  AM PM       AM PM       AM PM       AM PM       AM PM       AM PM       AM PM       AM PM       Total Daily amount of Acetaminophen Do not take more than  3,000 mg per day      Day 3    Time  Name of  Medication Number of pills taken  Amount of Acetaminophen  Pain Level   Comments  AM PM       AM PM       AM PM       AM PM          AM PM       AM PM       AM PM       AM PM       Total Daily amount of Acetaminophen Do not take more than  3,000 mg per day      Day 4    Time  Name of Medication Number of pills taken  Amount of Acetaminophen  Pain Level   Comments  AM PM       AM PM       AM PM       AM PM       AM PM       AM PM       AM PM       AM PM       Total Daily amount of Acetaminophen Do not take more than  3,000 mg per day      Day 5    Time  Name of Medication Number of pills taken  Amount of Acetaminophen  Pain Level   Comments  AM PM       AM PM       AM PM       AM PM       AM PM       AM PM       AM PM       AM PM       Total Daily amount of Acetaminophen Do not take more than  3,000 mg per day       Day 6    Time  Name of Medication Number of pills taken  Amount of Acetaminophen  Pain Level  Comments  AM PM       AM PM       AM PM       AM PM       AM PM       AM PM       AM  PM       AM PM       Total Daily amount of Acetaminophen Do not take more than  3,000 mg per day      Day 7    Time  Name of Medication Number of pills taken  Amount of Acetaminophen  Pain Level   Comments  AM PM       AM PM       AM PM       AM PM       AM PM       AM PM       AM PM       AM PM       Total Daily amount of Acetaminophen Do not take more than  3,000 mg per day        For additional information about how and where to safely dispose of unused opioid medications - [employer]  Disclaimer: This document contains information and/or instructional materials adapted from [place] for the typical patient with your condition. It does not replace medical advice from your health care provider because your experience may differ from that of the typical patient. Talk to your health care  provider if you have any questions about this document, your condition or your treatment plan. Adapted from [place] Instructions  Activity: Get plenty of rest for the remainder of the day. A responsible individual must stay with you for 24 hours following the procedure.  For the next 24 hours, DO NOT: -Drive a car -Paediatric nurse -Drink alcoholic beverages -Take any medication unless instructed by your physician -Make any legal decisions or sign important papers.  Meals: Start with liquid foods such as gelatin or soup. Progress to regular foods as tolerated. Avoid greasy, spicy, heavy foods. If nausea and/or vomiting occur, drink only clear liquids until the nausea and/or vomiting subsides. Call your physician if vomiting continues.  Special Instructions/Symptoms: Your throat may feel dry or sore from the anesthesia or the breathing tube placed in your throat during surgery. If this causes discomfort, gargle with warm salt water. The discomfort should disappear within 24 hours.  If you had a scopolamine patch placed behind your ear for the management of post- operative nausea and/or vomiting:  1. The medication in the patch is effective for 72 hours, after which it should be removed.  Wrap patch in a tissue and discard in the trash. Wash hands thoroughly with soap and water. 2. You may remove the patch earlier than 72 hours if you experience unpleasant side effects which may include dry mouth, dizziness or visual disturbances. 3. Avoid touching the patch. Wash your hands with soap and water after contact with the patch.    Information for Discharge Teaching: EXPAREL (bupivacaine liposome injectable suspension)   Your surgeon or anesthesiologist gave you EXPAREL(bupivacaine) to help control your pain after surgery.   EXPAREL is a local anesthetic that provides pain relief by numbing the tissue around the surgical site.  EXPAREL is  designed to release pain medication over time and can control pain for up to 72 hours.  Depending on how you respond to EXPAREL, you may require less pain medication during your recovery.  Possible side effects:  Temporary loss of sensation or ability to move in the area where bupivacaine was injected.  Nausea, vomiting, constipation  Rarely, numbness and tingling in your mouth or lips, lightheadedness, or anxiety may  occur.  Call your doctor right away if you think you may be experiencing any of these sensations, or if you have other questions regarding possible side effects.  Follow all other discharge instructions given to you by your surgeon or nurse. Eat a healthy diet and drink plenty of water or other fluids.  If you return to the hospital for any reason within 96 hours following the administration of EXPAREL, it is important for health care providers to know that you have received this anesthetic. A teal colored band has been placed on your arm with the date, time and amount of EXPAREL you have received in order to alert and inform your health care providers. Please leave this armband in place for the full 96 hours following administration, and then you may remove the band.

## 2018-10-24 NOTE — Anesthesia Procedure Notes (Signed)
Procedure Name: LMA Insertion Date/Time: 10/24/2018 10:01 AM Performed by: Myna Bright, CRNA Pre-anesthesia Checklist: Patient identified, Emergency Drugs available, Patient being monitored and Suction available Patient Re-evaluated:Patient Re-evaluated prior to induction Oxygen Delivery Method: Circle system utilized Preoxygenation: Pre-oxygenation with 100% oxygen Induction Type: IV induction Ventilation: Mask ventilation without difficulty LMA: LMA inserted LMA Size: 4.0 Number of attempts: 1 Placement Confirmation: positive ETCO2 and breath sounds checked- equal and bilateral Tube secured with: Tape Dental Injury: Teeth and Oropharynx as per pre-operative assessment

## 2018-10-24 NOTE — Op Note (Signed)
Patient Name:           Joan Lopez   Date of Surgery:        10/24/2018  Pre op Diagnosis:      Invasive ductal carcinoma right breast, upper inner quadrant, receptor positive, HER-2 negative.  Post op Diagnosis:    Same  Procedure:                 Inject blue dye right breast, right breast lumpectomy with radioactive seed localization, right axillary deep sentinel lymph node biopsy  Surgeon:                     Joan Lopez. Joan Lopez, M.D., FACS  Assistant:                      OR staff  Operative Indications:          This is a very pleasant 67 year old female, referred by Dr. Enrique Lopez at the [facility] for surgical consultation regarding invasive ductal carcinoma right breast, upper inner quadrant. Joan Lopez is her PCP.      This is a very healthy woman, runs marathons. No prior breast problems. Recent screening mammogram showed 2 findings. In the right breast at the 12 o'clock position there is a 9 mm mass, 6 cm from the nipple. This was biopsied and showed a benign fibroadenoma. In the right breast at the 1 o'clock position, there is a 6 mm mass, 9 cm from the nipple. This shows grade 1 invasive ductal carcinoma. ER 100%. PR 10%. HER-2 neg. Ultrasound of the right axilla is negative Family history reveals sister had breast cancer at age 73. She recurred and had bilateral mastectomies. Paternal grandmother had breast cancer. Maternal grandmother had brain cancer. Father died of kidney cancer. Mother living has had a stroke and heart disease. Maternal aunt had colon cancer. There is no family history of genetic testing according to the patient.  We had a very lengthy discussion. We talked about surgical management which could take the form of lumpectomy and sentinel lymph node biopsy and we compare that to mastectomy with or without reconstruction. She knows radiation therapy may play a role in her care depending on the extent of surgery and stage. She knows that final  stage will be determined pathologically after surgery.  She does not want to delay her surgical care. She is interested in genetic testing, primarily because of offspring its relatives. She says she does not think he would influence her decisions about extent of surgery at her age      she'll be scheduled for inject blue dye right breast, right breast lumpectomy with radioactive seed localization, right axillary sentinel lymph node biopsy. I do not necessarily plan to remove the fibroadenoma.  Operative Findings:       The breasts were relatively small and thin in the upper pole.  The specimen mammogram looked good with the seed and marker clip in the relative center of the specimen.  The broad posterior margin is the pectoralis fashion.  I did not see any blue dye in the axillary lymph nodes.  There was mild to moderate radioactivity and so cervical lymph nodes were sent as sentinel nodes.  There were no map pathologically enlarged lymph nodes  Procedure in Detail:          Following the induction of general LMA anesthesia a surgical timeout was performed.  Intravenous antibiotics were given.  Following alcohol prep I  injected 5 cc of dilute methylene blue into the right breast subareolar area and massaged the breast for 2 minutes.  The entire right breast and chest wall were then prepped and draped in a sterile fashion.  0.5% Marcaine with epinephrine was used as a local infiltration anesthetic to supplement the pectoral block.      Using the neoprobe I found the radioactive seed in the upper breast.  I designed a curvilinear incision in this area.  The incision was made with a knife.  The lumpectomy was performed with the neoprobe and electrocautery.  The specimen was removed and marked with silk sutures and a 6 color ink kit.  The specimen mammogram looked very good as described above.  The specimen was sent to the lab where the seed was retrieved.  Hemostasis was excellent.  The wound was irrigated.   I placed 5 metal marker clips in the walls of the lumpectomy cavity.  Lumpectomy cavity was closed in several layers with interrupted sutures of 3-0 Vicryl and the skin closed with a running subcuticular 4-0 Monocryl and Dermabond.      A transverse incision was made at the hairline in the right axilla.  Dissection was carried down through the clavipectoral fascia.  I entered the axilla.  The axilla was without any evidence of blue dye.  There was some technetium radioactivity and I removed a few nodes.  None of the lymph nodes were significantly enlarged.  There was no pathologic adenopathy.  Hemostasis was excellent and achieved with metal clips and electrocautery.  The wound was irrigated.  The clavipectoral fascia was closed with 3-0 Vicryl sutures and the skin closed with a running subcuticular 4-0 Monocryl and Dermabond.  Dry bandages and a breast binder were placed.  The patient tolerated the procedure well and was taken to PACU in stable condition.  EBL 20 cc.  Counts correct.  Complications none.    Addendum: I logged onto the PMP aware website and reviewed her prescription medication history     Joan Lopez M. Joan Lopez, M.D., FACS General and Minimally Invasive Surgery Breast and Colorectal Surgery  10/24/2018 11:11 AM

## 2018-10-24 NOTE — Interval H&P Note (Signed)
History and Physical Interval Note:  10/24/2018 8:32 AM  Joan Lopez  has presented today for surgery, with the diagnosis of RIGHT BREAST CANCER.  The various methods of treatment have been discussed with the patient and family. After consideration of risks, benefits and other options for treatment, the patient has consented to  Procedure(s) with comments: RIGHT BREAST LUMPECTOMY WITH RADIOACTIVE SEED AND RIGHT AXILLARY SENTINEL LYMPH NODE BIOPSY RIGHT BLUE DYE INJECTION (Right) - PEC BLOCK as a surgical intervention.  The patient's history has been reviewed, patient examined, no change in status, stable for surgery.  I have reviewed the patient's chart and labs.  Questions were answered to the patient's satisfaction.     Adin Hector

## 2018-10-24 NOTE — Anesthesia Preprocedure Evaluation (Addendum)
Anesthesia Evaluation  Patient identified by MRN, date of birth, ID band Patient awake    Reviewed: Allergy & Precautions, NPO status , Patient's Chart, lab work & pertinent test results  History of Anesthesia Complications Negative for: history of anesthetic complications  Airway Mallampati: I  TM Distance: >3 FB Neck ROM: Full    Dental  (+) Dental Advisory Given, Teeth Intact   Pulmonary neg pulmonary ROS,    breath sounds clear to auscultation       Cardiovascular negative cardio ROS   Rhythm:Regular Rate:Normal     Neuro/Psych PSYCHIATRIC DISORDERS Anxiety negative neurological ROS     GI/Hepatic negative GI ROS, Neg liver ROS,   Endo/Other  negative endocrine ROS  Renal/GU Renal InsufficiencyRenal disease Renal cysts      Musculoskeletal  (+) Arthritis ,   Abdominal   Peds  Hematology negative hematology ROS (+)   Anesthesia Other Findings   Reproductive/Obstetrics  Breast cancer                             Anesthesia Physical Anesthesia Plan  ASA: II  Anesthesia Plan: General   Post-op Pain Management:  Regional for Post-op pain   Induction: Intravenous  PONV Risk Score and Plan: 3 and Treatment may vary due to age or medical condition, Ondansetron and Dexamethasone  Airway Management Planned: LMA  Additional Equipment: None  Intra-op Plan:   Post-operative Plan: Extubation in OR  Informed Consent: I have reviewed the patients History and Physical, chart, labs and discussed the procedure including the risks, benefits and alternatives for the proposed anesthesia with the patient or authorized representative who has indicated his/her understanding and acceptance.     Dental advisory given  Plan Discussed with: CRNA and Anesthesiologist  Anesthesia Plan Comments:        Anesthesia Quick Evaluation

## 2018-10-24 NOTE — Progress Notes (Signed)
Assisted Dr. Fransisco Beau with right, ultrasound guided, pectoralis block. Side rails up, monitors on throughout procedure. See vital signs in flow sheet. Tolerated Procedure well.

## 2018-10-24 NOTE — Anesthesia Postprocedure Evaluation (Signed)
Anesthesia Post Note  Patient: Joan Lopez  Procedure(s) Performed: RIGHT BREAST LUMPECTOMY WITH RADIOACTIVE SEED AND RIGHT AXILLARY SENTINEL LYMPH NODE BIOPSY, RIGHT BLUE DYE INJECTION (Right Breast)     Patient location during evaluation: PACU Anesthesia Type: General Level of consciousness: awake and alert Pain management: pain level controlled Vital Signs Assessment: post-procedure vital signs reviewed and stable Respiratory status: spontaneous breathing, nonlabored ventilation and respiratory function stable Cardiovascular status: blood pressure returned to baseline and stable Postop Assessment: no apparent nausea or vomiting Anesthetic complications: no    Last Vitals:  Vitals:   10/24/18 1145 10/24/18 1210  BP: 124/80 116/78  Pulse: 68 67  Resp: 15 18  Temp:  36.5 C  SpO2: 99% 100%    Last Pain:  Vitals:   10/24/18 1210  TempSrc:   PainSc: 0-No pain                 Audry Pili

## 2018-10-24 NOTE — Transfer of Care (Signed)
Immediate Anesthesia Transfer of Care Note  Patient: Joan Lopez  Procedure(s) Performed: RIGHT BREAST LUMPECTOMY WITH RADIOACTIVE SEED AND RIGHT AXILLARY SENTINEL LYMPH NODE BIOPSY, RIGHT BLUE DYE INJECTION (Right Breast)  Patient Location: PACU  Anesthesia Type:General and Regional  Level of Consciousness: drowsy  Airway & Oxygen Therapy: Patient Spontanous Breathing and Patient connected to nasal cannula oxygen  Post-op Assessment: Report given to RN and Post -op Vital signs reviewed and stable  Post vital signs: Reviewed and stable  Last Vitals:  Vitals Value Taken Time  BP    Temp    Pulse 58 10/24/18 1109  Resp 10 10/24/18 1109  SpO2 100 % 10/24/18 1109  Vitals shown include unvalidated device data.  Last Pain:  Vitals:   10/24/18 0850  TempSrc: Oral  PainSc: 0-No pain      Patients Stated Pain Goal: 0 (00/93/81 8299)  Complications: No apparent anesthesia complications

## 2018-10-24 NOTE — Anesthesia Procedure Notes (Signed)
Anesthesia Regional Block: Pectoralis block   Pre-Anesthetic Checklist: ,, timeout performed, Correct Patient, Correct Site, Correct Laterality, Correct Procedure, Correct Position, site marked, Risks and benefits discussed,  Surgical consent,  Pre-op evaluation,  At surgeon's request and post-op pain management  Laterality: Right  Prep: chloraprep       Needles:  Injection technique: Single-shot  Needle Type: Echogenic Needle     Needle Length: 10cm  Needle Gauge: 21     Additional Needles:   Narrative:  Start time: 10/24/2018 9:15 AM End time: 10/24/2018 9:19 AM Injection made incrementally with aspirations every 5 mL.  Performed by: Personally  Anesthesiologist: Audry Pili, MD  Additional Notes: No pain on injection. No increased resistance to injection. Injection made in 5cc increments. Good needle visualization. Patient tolerated the procedure well.

## 2018-10-27 ENCOUNTER — Encounter (HOSPITAL_BASED_OUTPATIENT_CLINIC_OR_DEPARTMENT_OTHER): Payer: Self-pay | Admitting: General Surgery

## 2018-10-27 NOTE — Progress Notes (Signed)
Inform patient of Pathology report,. 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 Total; size 0.9 cm Margins are negative. All three nodes are negative. No further surgery needed... so good news there. Be sure she has follow up with me, med-onc and rad-onc. Let me know you reached her.  Thanks, Dalbert Batman

## 2018-10-28 ENCOUNTER — Telehealth: Payer: Self-pay | Admitting: Genetic Counselor

## 2018-10-28 NOTE — Telephone Encounter (Signed)
Patient returned call re setting up genetics appointment.  Confirmed 7/17 genetics with patient. Patient requests inpatient visit.

## 2018-10-30 ENCOUNTER — Inpatient Hospital Stay: Payer: Medicare Other

## 2018-10-30 ENCOUNTER — Inpatient Hospital Stay: Payer: Medicare Other | Attending: Genetic Counselor | Admitting: Genetic Counselor

## 2018-10-30 ENCOUNTER — Other Ambulatory Visit: Payer: Self-pay

## 2018-10-30 ENCOUNTER — Encounter: Payer: Self-pay | Admitting: Genetic Counselor

## 2018-10-30 DIAGNOSIS — Z8051 Family history of malignant neoplasm of kidney: Secondary | ICD-10-CM | POA: Diagnosis not present

## 2018-10-30 DIAGNOSIS — Z808 Family history of malignant neoplasm of other organs or systems: Secondary | ICD-10-CM | POA: Diagnosis not present

## 2018-10-30 DIAGNOSIS — C50211 Malignant neoplasm of upper-inner quadrant of right female breast: Secondary | ICD-10-CM

## 2018-10-30 DIAGNOSIS — D696 Thrombocytopenia, unspecified: Secondary | ICD-10-CM

## 2018-10-30 DIAGNOSIS — Z800 Family history of malignant neoplasm of digestive organs: Secondary | ICD-10-CM | POA: Insufficient documentation

## 2018-10-30 DIAGNOSIS — Z803 Family history of malignant neoplasm of breast: Secondary | ICD-10-CM | POA: Diagnosis not present

## 2018-10-30 DIAGNOSIS — Z809 Family history of malignant neoplasm, unspecified: Secondary | ICD-10-CM

## 2018-10-30 DIAGNOSIS — Z170 Estrogen receptor positive status [ER+]: Secondary | ICD-10-CM

## 2018-10-30 NOTE — Progress Notes (Signed)
REFERRING PROVIDER: Fanny Skates, MD [address] [place],  [place] [address]  PRIMARY PROVIDER:  Silverio Decamp, MD  PRIMARY REASON FOR VISIT:  1. Malignant neoplasm of upper-inner quadrant of right breast in female, estrogen receptor positive ([place])   2. Family history of breast cancer   3. Family history of colon cancer   4. Family history of stomach cancer   5. Family history of brain cancer   6. Family history of kidney cancer   7. Family history of oral cancer   8. Family history of cancer      HISTORY OF PRESENT ILLNESS:   Joan Lopez, a 68 y.o. female, was seen for a [place] cancer genetics consultation at the request of Dr. Dalbert Batman due to a personal and family history of breast cancer.  Joan Lopez presents to clinic today to discuss the possibility of a hereditary predisposition to cancer, genetic testing, and to further clarify her future cancer risks, as well as potential cancer risks for family members.   In 2020, at the age of 14, Joan Lopez was diagnosed with Intraductal Carcinoma, ER+/PR+/Her2-, of the right breast. The treatment plan includes surgery, which was performed on 10/24/2018.   CANCER HISTORY:  Oncology History  Malignant neoplasm of upper-inner quadrant of right breast in female, estrogen receptor positive ([place])  09/29/2018 Initial Diagnosis   Malignant neoplasm of upper-inner quadrant of right breast in female, estrogen receptor positive ([place])   10/08/2018 Cancer Staging   Staging form: Breast, AJCC 8th Edition - Clinical: Stage IA (cT1b, cN0, cM0, G1, ER+, PR+, HER2-) - Signed by Gardenia Phlegm, NP on 10/08/2018      RISK FACTORS:  Menarche was at age 59.  First live birth at age 55.  Ovaries intact: no.  Hysterectomy: yes.  Menopausal status: postmenopausal.  HRT use: 0 years. Colonoscopy: 2015 (return in 10 years) Mammogram within the last year: yes.   Past Medical History:  Diagnosis Date   Abnormal LFTs  10/29/2012   Acquired bilateral renal cysts    Arthritis    Bilateral bunions    Constipation 10/16/2011   DDD (degenerative disc disease)    spine w narrowing   Dehydration, mild 12/26/2011   Diverticulitis    Exercise counseling 11/14/2011   Family history of brain cancer    Family history of breast cancer    Family history of cancer    Family history of colon cancer    Family history of kidney cancer    Family history of oral cancer    Family history of stomach cancer    Fuch's endothelial dystrophy    Guttate psoriasis    Hematuria 08/24/2011   Hemorrhoids    History of chicken pox    Hyperlipidemia    Kidney stones    Lipoma of arm 07/13/2012   Lipoma of arm 10/07/2012   Left    Mass of arm 07/13/2012   Has had them in past     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   Post-menopausal bleeding 10/08/2011   Reflux 04/17/2012   Renal insufficiency 01/11/2013   Thrombocytopenia ([place]) 09/27/2011    Past Surgical History:  Procedure Laterality Date   APPENDECTOMY  1986   BREAST LUMPECTOMY WITH RADIOACTIVE SEED AND SENTINEL LYMPH NODE BIOPSY Right 10/24/2018   Procedure: RIGHT BREAST LUMPECTOMY WITH RADIOACTIVE SEED AND RIGHT AXILLARY SENTINEL LYMPH NODE BIOPSY, RIGHT BLUE DYE INJECTION;  Surgeon: Fanny Skates, MD;  Location: [facility] [facility];  Service: General;  Laterality: Right;  PEC BLOCK   CESAREAN SECTION     CHOLECYSTECTOMY     COLONOSCOPY  2005   (records here)[place] - diverticulosis and hemorrhoids   CORNEAL TRANSPLANT  2013   CYSTOSCOPY WITH RETROGRADE PYELOGRAM, URETEROSCOPY AND STENT PLACEMENT Right 10/22/2013   Procedure: CYSTOSCOPY WITH RETROGRADE PYELOGRAM, URETEROSCOPY ;  Surgeon: Raynelle Bring, MD;  Location: [facility];  Service: Urology;  Laterality: Right;   LIPOMA EXCISION  2014   x 3, both arms   TONSILLECTOMY     possible adenoids   TOTAL ABDOMINAL HYSTERECTOMY  1991   for fibroids   TUBAL LIGATION     WRIST  FRACTURE SURGERY     right, titanium plate and pins    Social History   Socioeconomic History   Marital status: Married    Spouse name: Not on file   Number of children: Not on file   Years of education: Not on file   Highest education level: Not on file  Occupational History   Not on file  Social Needs   Financial resource strain: Not on file   Food insecurity    Worry: Not on file    Inability: Not on file   Transportation needs    Medical: Not on file    Non-medical: Not on file  Tobacco Use   Smoking status: Never Smoker   Smokeless tobacco: Never Used  Substance and Sexual Activity   Alcohol use: No   Drug use: No   Sexual activity: Yes  Lifestyle   Physical activity    Days per week: Not on file    Minutes per session: Not on file   Stress: Not on file  Relationships   Social connections    Talks on phone: Not on file    Gets together: Not on file    Attends religious service: Not on file    Active member of club or organization: Not on file    Attends meetings of clubs or organizations: Not on file    Relationship status: Not on file  Other Topics Concern   Not on file  Social History Narrative   Not on file     FAMILY HISTORY:  We obtained a detailed, 4-generation family history.  Significant diagnoses are listed below: Family History  Problem Relation Age of Onset   Stroke Mother    Heart disease Mother    Hypertension Mother    Kidney disease Father    Breast cancer Sister 54   Cancer Sister 28       ductal carcinoma breast- had it in 1999 and just found out its in the opposite breast   Brain cancer Maternal Grandmother 66       brain tumor   Breast cancer Paternal Grandmother 56       post menopausal    Pneumonia Maternal Grandfather    Colon cancer Maternal Aunt 65       40s or 60s   Cancer Paternal Uncle 64       neck tumor   Stomach cancer Maternal Aunt 80       80s   Kidney cancer Cousin 30       30s,  maternal first cousin   Cancer Paternal Aunt        blood cancer, unknown but older age at diagnosis   Cancer Maternal Aunt 70       unknown, but not breast, 3s   Cancer Cousin  oral cancer diagnosed older, maternal first cousin   Esophageal cancer Neg Hx    Pancreatic cancer Neg Hx    Rectal cancer Neg Hx     Joan Lopez has one sister who was diagnosed with breast cancer first when she was 48, and a second time when she was 8. Joan Lopez also has one daughter and two granddaughters.   Joan Lopez mother passed away at age 52, and she had four maternal aunts. One maternal aunt had colon cancer diagnosed either in her 70s or 24s. A second maternal aunt had stomach cancer diagnosed in her 38s. A third maternal aunt had an unknown type of cancer diagnosed in her 75s. Joan Lopez has a maternal first cousin who had kidney cancer diagnosed in her 41s. She has another maternal first cousin who had oral cancer, diagnosed at an older age. Joan Lopez maternal grandmother had brain cancer at the age of 83.  Joan Lopez father passed away at age 82, and she has a paternal uncle who had a cancer of the neck that he died from when he was 64. She has a paternal aunt who had a blood cancer, possibly leukemia, diagnosed when she was older. Joan Lopez paternal grandmother had breast cancer when she was approximately 70 years old.  Joan Lopez is unaware of previous family history of genetic testing for hereditary cancer risks. Patient's maternal ancestors are of [name] and [place] descent, and paternal ancestors are of [place] and [place] descent. There is no reported Ashkenazi Jewish ancestry. There is no known consanguinity.  GENETIC COUNSELING ASSESSMENT: Joan Lopez is a 68 y.o. female with a personal history of breast cancer and family history of breast, colon, stomach, kidney, and brain cancer which is somewhat suggestive of a hereditary cancer syndrome and predisposition to cancer. We,  therefore, discussed and recommended the following at today's visit.   DISCUSSION: We discussed that 5 - 10% of breast cancer is hereditary, with most cases associated with BRCA1/2.  There are other genes that can be associated with hereditary breast cancer syndromes.  These include ATM, PALB2, CHEK2, etc.  We discussed that testing is beneficial for several reasons including knowing how to follow individuals after completing their treatment, identifying whether potential treatment options such as PARP inhibitors would be beneficial, and understand if other family members could be at risk for cancer and allow them to undergo genetic testing.   We reviewed the characteristics, features and inheritance patterns of hereditary cancer syndromes. We also discussed genetic testing, including the appropriate family members to test, the process of testing, insurance coverage and turn-around-time for results. We discussed the implications of a negative, positive and/or variant of uncertain significant result. We recommended Joan Lopez pursue genetic testing for the [facility] Multi-Cancer panel.  The Multi-Gene Panel offered by [facility] includes sequencing and/or deletion duplication testing of the following 85 genes: AIP, ALK, APC, ATM, AXIN2,BAP1,  BARD1, BLM, BMPR1A, BRCA1, BRCA2, BRIP1, CASR, CDC73, CDH1, CDK4, CDKN1B, CDKN1C, CDKN2A (p14ARF), CDKN2A (p16INK4a), CEBPA, CHEK2, CTNNA1, DICER1, DIS3L2, EGFR (c.2369C>T, p.Thr790Met variant only), EPCAM (Deletion/duplication testing only), FH, FLCN, GATA2, GPC3, GREM1 (Promoter region deletion/duplication testing only), HOXB13 (c.251G>A, p.Gly84Glu), HRAS, KIT, MAX, MEN1, MET, MITF (c.952G>A, p.Glu318Lys variant only), MLH1, MSH2, MSH3, MSH6, MUTYH, NBN, NF1, NF2, NTHL1, PALB2, PDGFRA, PHOX2B, PMS2, POLD1, POLE, POT1, PRKAR1A, PTCH1, PTEN, RAD50, RAD51C, RAD51D, RB1, RECQL4, RET, RNF43, RUNX1, SDHAF2, SDHA (sequence changes only), SDHB, SDHC, SDHD, SMAD4, SMARCA4, SMARCB1,  SMARCE1, STK11, SUFU, TERC, TERT, TMEM127, TP53, TSC1, TSC2, VHL,  WRN and WT1.    Based on Joan Lopez personal and family history of cancer, she meets medical criteria for genetic testing. Despite that she meets criteria, she may still have an out of pocket cost. We discussed that if her out of pocket cost for testing is over $100, the laboratory will call and confirm whether she wants to proceed with testing.  If the out of pocket cost of testing is less than $100 she will be billed by the genetic testing laboratory.   PLAN: After considering the risks, benefits, and limitations, Joan Lopez provided informed consent to pursue genetic testing and the blood sample was sent to [facility] for analysis of the Multi-Cancer panel. Results should be available within approximately two-three weeks' time, at which point they will be disclosed by telephone to Joan Lopez, as will any additional recommendations warranted by these results. Joan Lopez will receive a summary of her genetic counseling visit and a copy of her results once available. This information will also be available in Epic.   Joan Lopez questions were answered to her satisfaction today. Our contact information was provided should additional questions or concerns arise. Thank you for the referral and allowing [name] to share in the care of your patient.   Clint Guy, MS Genetic Counselor [email or web address] Phone: [phone]   The patient was seen for a total of 60 minutes in face-to-face genetic counseling.  This patient was discussed with Drs. Magrinat, Lindi Adie and/or Burr Medico who agrees with the above.    _______________________________________________________________________ For Office Staff:  Number of people involved in session: 1 Was an Intern/ student involved with case: no

## 2018-11-03 ENCOUNTER — Other Ambulatory Visit: Payer: Self-pay

## 2018-11-03 ENCOUNTER — Encounter: Payer: Self-pay | Admitting: Sports Medicine

## 2018-11-03 ENCOUNTER — Ambulatory Visit (INDEPENDENT_AMBULATORY_CARE_PROVIDER_SITE_OTHER): Payer: Medicare Other | Admitting: Sports Medicine

## 2018-11-03 DIAGNOSIS — S80862A Insect bite (nonvenomous), left lower leg, initial encounter: Secondary | ICD-10-CM

## 2018-11-03 DIAGNOSIS — W57XXXA Bitten or stung by nonvenomous insect and other nonvenomous arthropods, initial encounter: Secondary | ICD-10-CM | POA: Diagnosis not present

## 2018-11-03 MED ORDER — DOXYCYCLINE HYCLATE 100 MG PO TABS: 100.0000 mg | ORAL_TABLET | Freq: Two times a day (BID) | ORAL | 0 refills | Status: AC

## 2018-11-03 NOTE — Assessment & Plan Note (Signed)
Unclear how long the tick was attached we will do tickborne serology testing as well as doxycycline for 7 days.

## 2018-11-03 NOTE — Progress Notes (Signed)
Subjective:    CC: Tick bite  HPI: Joan Lopez is a very pleasant 68 year old female, she pulled the tick off of her leg recently, she does not know how long it was attached, no myalgias, arthralgias.  No rash.  I reviewed the past medical history, family history, social history, surgical history, and allergies today and no changes were needed.  Please see the problem list section below in epic for further details.  Past Medical History: Past Medical History:  Diagnosis Date  . Abnormal LFTs 10/29/2012  . Acquired bilateral renal cysts   . Arthritis   . Bilateral bunions   . Constipation 10/16/2011  . DDD (degenerative disc disease)    spine w narrowing  . Dehydration, mild 12/26/2011  . Diverticulitis   . Exercise counseling 11/14/2011  . Family history of brain cancer   . Family history of breast cancer   . Family history of cancer   . Family history of colon cancer   . Family history of kidney cancer   . Family history of oral cancer   . Family history of stomach cancer   . Fuch's endothelial dystrophy   . Guttate psoriasis   . Hematuria 08/24/2011  . Hemorrhoids   . History of chicken pox   . Hyperlipidemia   . Kidney stones   . Lipoma of arm 07/13/2012  . Lipoma of arm 10/07/2012   Left   . Mass of arm 07/13/2012   Has had them in past    . 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 Post-menopausal bleeding 10/08/2011  . Reflux 04/17/2012  . Renal insufficiency 01/11/2013  . Thrombocytopenia ([place]) 09/27/2011   Past Surgical History: Past Surgical History:  Procedure Laterality Date  . APPENDECTOMY  1986  . BREAST LUMPECTOMY WITH RADIOACTIVE SEED AND SENTINEL LYMPH NODE BIOPSY Right 10/24/2018   Procedure: RIGHT BREAST LUMPECTOMY WITH RADIOACTIVE SEED AND RIGHT AXILLARY SENTINEL LYMPH NODE BIOPSY, RIGHT BLUE DYE INJECTION;  Surgeon: Fanny Skates, MD;  Location: [place];  Service: General;  Laterality: Right;  PEC BLOCK  . CESAREAN SECTION    . CHOLECYSTECTOMY     . COLONOSCOPY  2005   (records here)[place] - diverticulosis and hemorrhoids  . CORNEAL TRANSPLANT  2013  . CYSTOSCOPY WITH RETROGRADE PYELOGRAM, URETEROSCOPY AND STENT PLACEMENT Right 10/22/2013   Procedure: CYSTOSCOPY WITH RETROGRADE PYELOGRAM, URETEROSCOPY ;  Surgeon: Raynelle Bring, MD;  Location: [facility];  Service: Urology;  Laterality: Right;  . LIPOMA EXCISION  2014   x 3, both arms  . TONSILLECTOMY     possible adenoids  . TOTAL ABDOMINAL HYSTERECTOMY  1991   for fibroids  . TUBAL LIGATION    . WRIST FRACTURE SURGERY     right, titanium plate and pins   Social History: Social History   Socioeconomic History  . Marital status: Married    Spouse name: Not on file  . Number of children: Not on file  . Years of education: Not on file  . Highest education level: Not on file  Occupational History  . Not on file  Social Needs  . Financial resource strain: Not on file  . Food insecurity    Worry: Not on file    Inability: Not on file  . Transportation needs    Medical: Not on file    Non-medical: Not on file  Tobacco Use  . Smoking status: Never Smoker  . Smokeless tobacco: Never Used  Substance and Sexual Activity  . Alcohol use: No  .  Drug use: No  . Sexual activity: Yes  Lifestyle  . Physical activity    Days per week: Not on file    Minutes per session: Not on file  . Stress: Not on file  Relationships  . Social Herbalist on phone: Not on file    Gets together: Not on file    Attends religious service: Not on file    Active member of club or organization: Not on file    Attends meetings of clubs or organizations: Not on file    Relationship status: Not on file  Other Topics Concern  . Not on file  Social History Narrative  . Not on file   Family History: Family History  Problem Relation Age of Onset  . Stroke Mother   . Heart disease Mother   . Hypertension Mother   . Kidney disease Father   . Breast cancer Sister 1  . Cancer Sister 76        ductal carcinoma breast- had it in 1999 and just found out its in the opposite breast  . Brain cancer Maternal Grandmother 55       brain tumor  . Breast cancer Paternal Grandmother 40       post menopausal   . Pneumonia Maternal Grandfather   . Colon cancer Maternal Aunt 41       40s or 26s  . Cancer Paternal Uncle 64       neck tumor  . Stomach cancer Maternal Aunt 80       80s  . Kidney cancer Cousin 47       30s, maternal first cousin  . Cancer Paternal Aunt        blood cancer, unknown but older age at diagnosis  . Cancer Maternal Aunt 70       unknown, but not breast, 26s  . Cancer Cousin        oral cancer diagnosed older, maternal first cousin  . Esophageal cancer Neg Hx   . Pancreatic cancer Neg Hx   . Rectal cancer Neg Hx    Allergies: Allergies  Allergen Reactions  . Augmentin [Amoxicillin-Pot Clavulanate]     Severe yeast infection-would prefer not to take.   Medications: See med rec.  Review of Systems: No fevers, chills, night sweats, weight loss, chest pain, or shortness of breath.   Objective:    General: Well Developed, well nourished, and in no acute distress.  Neuro: Alert and oriented x3, extra-ocular muscles intact, sensation grossly intact.  HEENT: Normocephalic, atraumatic, pupils equal round reactive to light, neck supple, no masses, no lymphadenopathy, thyroid nonpalpable.  Skin: Warm and dry, no rashes.  On the back of her left leg there is a small nodule, no evidence of retained components of the tick. Cardiac: Regular rate and rhythm, no murmurs rubs or gallops, no lower extremity edema.  Respiratory: Clear to auscultation bilaterally. Not using accessory muscles, speaking in full sentences.  Impression and Recommendations:    Tick bite Unclear how long the tick was attached we will do tickborne serology testing as well as doxycycline for 7 days.   ___________________________________________ Gwen Her. Dianah Field, M.D., ABFM.,  CAQSM. Primary Care and Sports Medicine [facility]  Adjunct Professor of [place] [employer] [facility] of Medicine

## 2018-11-06 ENCOUNTER — Encounter: Payer: Self-pay | Admitting: Genetic Counselor

## 2018-11-06 ENCOUNTER — Ambulatory Visit: Payer: Self-pay | Admitting: Genetic Counselor

## 2018-11-06 ENCOUNTER — Telehealth: Payer: Self-pay | Admitting: Genetic Counselor

## 2018-11-06 DIAGNOSIS — Z1379 Encounter for other screening for genetic and chromosomal anomalies: Secondary | ICD-10-CM

## 2018-11-06 LAB — EHRLICHIA ANTIBODY PANEL
E. CHAFFEENSIS AB IGG: <1:64 {titer}
E. CHAFFEENSIS AB IGM: <1:20 {titer}

## 2018-11-06 LAB — ROCKY MTN SPOTTED FVR ABS PNL(IGG+IGM)
RMSF IgG: NOT DETECTED
RMSF IgM: NOT DETECTED

## 2018-11-06 LAB — BABESIA MICROTI ANTIBODIES IGG, IGM
Babesia microti IgG: <1:64 {titer}
Babesia microti IgM: <1:20 {titer}

## 2018-11-06 LAB — B. BURGDORFI ANTIBODIES: B burgdorferi Ab IgG+IgM: <0.9 index

## 2018-11-06 NOTE — Progress Notes (Signed)
HPI:  Joan Lopez was previously seen in the [facility] due to a personal and family history of breast cancer and a family history of colon, stomach, kidney, brain, oral cancer and leukemia, and concerns regarding a hereditary predisposition to cancer. Please refer to our prior cancer genetics clinic note for more information regarding our discussion, assessment and recommendations, at the time. Joan Lopez recent genetic test results were disclosed to her, as were recommendations warranted by these results. These results and recommendations are discussed in more detail below.  CANCER HISTORY:  Oncology History  Malignant neoplasm of upper-inner quadrant of right breast in female, estrogen receptor positive ([place])  09/29/2018 Initial Diagnosis   Malignant neoplasm of upper-inner quadrant of right breast in female, estrogen receptor positive ([place])   10/08/2018 Cancer Staging   Staging form: Breast, AJCC 8th Edition - Clinical: Stage IA (cT1b, cN0, cM0, G1, ER+, PR+, HER2-) - Signed by Joan Phlegm, NP on 10/08/2018   10/30/2018 Cancer Staging   Staging form: Breast, AJCC 8th Edition - Pathologic stage from 10/30/2018: Stage IA (pT1b, pN0, cM0, G1, ER+, PR+, HER2-) - Signed by Joan Phlegm, NP on 11/05/2018   11/06/2018 Genetic Testing   Negative genetic testing on the [facility] Multi-Cancer panel.  The report date is 11/06/2018.  The Multi-Gene Panel offered by [facility] includes sequencing and/or deletion duplication testing of the following 85 genes: AIP, ALK, APC, ATM, AXIN2,BAP1,  BARD1, BLM, BMPR1A, BRCA1, BRCA2, BRIP1, CASR, CDC73, CDH1, CDK4, CDKN1B, CDKN1C, CDKN2A (p14ARF), CDKN2A (p16INK4a), CEBPA, CHEK2, CTNNA1, DICER1, DIS3L2, EGFR (c.2369C>T, p.Thr790Met variant only), EPCAM (Deletion/duplication testing only), FH, FLCN, GATA2, GPC3, GREM1 (Promoter region deletion/duplication testing only), HOXB13 (c.251G>A, p.Gly84Glu), HRAS, KIT, MAX, MEN1, MET,  MITF (c.952G>A, p.Glu318Lys variant only), MLH1, MSH2, MSH3, MSH6, MUTYH, NBN, NF1, NF2, NTHL1, PALB2, PDGFRA, PHOX2B, PMS2, POLD1, POLE, POT1, PRKAR1A, PTCH1, PTEN, RAD50, RAD51C, RAD51D, RB1, RECQL4, RET, RNF43, RUNX1, SDHAF2, SDHA (sequence changes only), SDHB, SDHC, SDHD, SMAD4, SMARCA4, SMARCB1, SMARCE1, STK11, SUFU, TERC, TERT, TMEM127, TP53, TSC1, TSC2, VHL, WRN and WT1.       FAMILY HISTORY:  We obtained a detailed, 4-generation family history.  Significant diagnoses are listed below: Family History  Problem Relation Age of Onset   Stroke Mother    Heart disease Mother    Hypertension Mother    Kidney disease Father    Breast cancer Sister 71   Cancer Sister 63       ductal carcinoma breast- had it in 1999 and just found out its in the opposite breast   Brain cancer Maternal Grandmother 36       brain tumor   Breast cancer Paternal Grandmother 40       post menopausal    Pneumonia Maternal Grandfather    Colon cancer Maternal Aunt 66       40s or 59s   Cancer Paternal Uncle 64       neck tumor   Stomach cancer Maternal Aunt 80       80s   Kidney cancer Cousin 30       30s, maternal first cousin   Cancer Paternal Aunt        blood cancer, unknown but older age at diagnosis   Cancer Maternal Aunt 70       unknown, but not breast, 55s   Cancer Cousin        oral cancer diagnosed older, maternal first cousin   Esophageal cancer Neg Hx    Pancreatic  cancer Neg Hx    Rectal cancer Neg Hx      Joan Lopez has one sister who was diagnosed with breast cancer first when she was 34, and a second time when she was 16. Joan Lopez also has one daughter and two granddaughters.   Joan Lopez mother passed away at age 22, and she had four maternal aunts. One maternal aunt had colon cancer diagnosed either in her 50s or 84s. A second maternal aunt had stomach cancer diagnosed in her 33s. A third maternal aunt had an unknown type of cancer diagnosed in her 49s. [name] has a maternal first cousin who had kidney cancer diagnosed in her 45s. She has another maternal first cousin who had oral cancer, diagnosed at an older age. Joan Lopez maternal grandmother had brain cancer at the age of 12.  Joan Lopez father passed away at age 72, and she has a paternal uncle who had a cancer of the neck that he died from when he was 78. She has a paternal aunt who had a blood cancer, possibly leukemia, diagnosed when she was older. Joan Lopez paternal grandmother had breast cancer when she was approximately 70 years old.  Joan Lopez is unaware of previous family history of genetic testing for hereditary cancer risks. Patient's maternal ancestors are of [name] and [place] descent, and paternal ancestors are of [place] and [place] descent. There is no reported Ashkenazi Jewish ancestry. There is no known consanguinity.  GENETIC TEST RESULTS: Genetic testing reported out on 11/06/2018 through the [facility] Multi-Cancer panel found no pathogenic mutations. The Multi-Gene Panel offered by [facility] includes sequencing and/or deletion duplication testing of the following 85 genes: AIP, ALK, APC, ATM, AXIN2,BAP1,  BARD1, BLM, BMPR1A, BRCA1, BRCA2, BRIP1, CASR, CDC73, CDH1, CDK4, CDKN1B, CDKN1C, CDKN2A (p14ARF), CDKN2A (p16INK4a), CEBPA, CHEK2, CTNNA1, DICER1, DIS3L2, EGFR (c.2369C>T, p.Thr790Met variant only), EPCAM (Deletion/duplication testing only), FH, FLCN, GATA2, GPC3, GREM1 (Promoter region deletion/duplication testing only), HOXB13 (c.251G>A, p.Gly84Glu), HRAS, KIT, MAX, MEN1, MET, MITF (c.952G>A, p.Glu318Lys variant only), MLH1, MSH2, MSH3, MSH6, MUTYH, NBN, NF1, NF2, NTHL1, PALB2, PDGFRA, PHOX2B, PMS2, POLD1, POLE, POT1, PRKAR1A, PTCH1, PTEN, RAD50, RAD51C, RAD51D, RB1, RECQL4, RET, RNF43, RUNX1, SDHAF2, SDHA (sequence changes only), SDHB, SDHC, SDHD, SMAD4, SMARCA4, SMARCB1, SMARCE1, STK11, SUFU, TERC, TERT, TMEM127, TP53, TSC1, TSC2, VHL, WRN and WT1. The test report has been  scanned into EPIC and is located under the Molecular Pathology section of the Results Review tab.  A portion of the result report is included below for reference.     We discussed with Joan Lopez that because current genetic testing is not perfect, it is possible there may be a gene mutation in one of these genes that current testing cannot detect, but that chance is small.  We also discussed, that there could be another gene that has not yet been discovered, or that we have not yet tested, that is responsible for the cancer diagnoses in the family. It is also possible there is a hereditary cause for the cancer in the family that Joan Lopez did not inherit and therefore was not identified in her testing.  Therefore, it is important to remain in touch with cancer genetics in the future so that we can continue to offer Joan Lopez the most up to date genetic testing.   ADDITIONAL GENETIC TESTING: We discussed with Joan Lopez that her genetic testing was fairly extensive.  If there are genes identified to increase cancer risk that can be analyzed in the future,  we would be happy to discuss and coordinate this testing at that time.    CANCER SCREENING RECOMMENDATIONS: Joan Lopez test result is considered negative (normal).  This means that we have not identified a hereditary cause for her personal and family history of cancer at this time. Most cancers happen by chance and this negative test suggests that her cancer may fall into this category.    While reassuring, this does not definitively rule out a hereditary predisposition to cancer. It is still possible that there could be genetic mutations that are undetectable by current technology. There could be genetic mutations in genes that have not been tested or identified to increase cancer risk.  Therefore, it is recommended she continue to follow the cancer management and screening guidelines provided by her oncology and primary healthcare provider.   An  individual's cancer risk and medical management are not determined by genetic test results alone. Overall cancer risk assessment incorporates additional factors, including personal medical history, family history, and any available genetic information that may result in a personalized plan for cancer prevention and surveillance  RECOMMENDATIONS FOR FAMILY MEMBERS:  Individuals in this family might be at some increased risk of developing cancer, over the general population risk, simply due to the family history of cancer.  We recommended women in this family have a yearly mammogram beginning at age 52, or 73 years younger than the earliest onset of cancer, an annual clinical breast exam, and perform monthly breast self-exams. Women in this family should also have a gynecological exam as recommended by their primary provider. All family members should have a colonoscopy by age 34.  It is also possible there is a hereditary cause for the cancer in Joan Lopez family that she did not inherit and therefore was not identified in her.  Based on Joan Lopez family history, we recommended her sister, who was diagnosed with breast cancer at age 73 and 39, have genetic counseling and testing. Joan Lopez will let us know if we can be of any assistance in coordinating genetic counseling and/or testing for this family member.   FOLLOW-UP: Lastly, we discussed with Joan Lopez that cancer genetics is a rapidly advancing field and it is possible that new genetic tests will be appropriate for her and/or her family members in the future. We encouraged her to remain in contact with cancer genetics on an annual basis so we can update her personal and family histories and let her know of advances in cancer genetics that may benefit this family.   Our contact number was provided. Ms. Demary questions were answered to her satisfaction, and she knows she is welcome to call us at anytime with additional questions or concerns.    Clint Guy, MS Genetic Counselor [email or web address] Phone: [phone]

## 2018-11-06 NOTE — Telephone Encounter (Signed)
Revealed negative genetic testing.  Discussed that we do not know why she has breast cancer or why there is cancer in the family.  It could be due to a different gene that we are not testing, or our current technology may not be able to pick something up.  Perhaps there is a pathogenic variant in the family that she did not inherit.  She should keep in touch periodically to learn about new genetic testing that she may qualify for.

## 2018-11-24 ENCOUNTER — Other Ambulatory Visit: Payer: Self-pay

## 2018-11-24 ENCOUNTER — Ambulatory Visit
Admission: RE | Admit: 2018-11-24 | Discharge: 2018-11-24 | Disposition: A | Discharge status: Home / Self Care | Payer: Medicare Other | Source: Ambulatory Visit | Attending: Radiation Oncology | Admitting: Radiation Oncology

## 2018-11-24 DIAGNOSIS — Z170 Estrogen receptor positive status [ER+]: Secondary | ICD-10-CM | POA: Diagnosis not present

## 2018-11-24 DIAGNOSIS — Z510 Encounter for antineoplastic radiation therapy: Secondary | ICD-10-CM | POA: Diagnosis not present

## 2018-11-24 DIAGNOSIS — C50211 Malignant neoplasm of upper-inner quadrant of right female breast: Secondary | ICD-10-CM | POA: Diagnosis not present

## 2018-11-24 NOTE — Progress Notes (Signed)
  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  Identity was confirmed.  All relevant records and images related to the planned course of therapy were reviewed.  The patient freely provided informed written consent to proceed with treatment after reviewing the details related to the planned course of therapy. The consent form was witnessed and verified by the simulation staff.  Then, the patient was set-up in a stable reproducible  supine position for radiation therapy.  CT images were obtained.  Surface markings were placed.  The CT images were loaded into the planning software.  Then the target and avoidance structures were contoured.  Treatment planning then occurred.  The radiation prescription was entered and confirmed.  Then, I designed and supervised the construction of a total of 5 medically necessary complex treatment devices.  I have requested : 3D Simulation  I have requested a DVH of the following structures: heart, lungs, lumpectomy cavity.  I have ordered:dose calc.  PLAN:  The patient will receive 40.05 Gy in 15 fractions directed at the whole breast. The patient will then proceed with a boost to the lumpectomy cavity of 10 Gy in 5 fractions.   Optical Surface Tracking Plan:  Since intensity modulated radiotherapy (IMRT) and 3D conformal radiation treatment methods are predicated on accurate and precise positioning for treatment, intrafraction motion monitoring is medically necessary to ensure accurate and safe treatment delivery.  The ability to quantify intrafraction motion without excessive ionizing radiation dose can only be performed with optical surface  tracking. Accordingly, surface imaging offers the opportunity to obtain 3D measurements of patient position throughout IMRT and 3D treatments without excessive radiation exposure.  I am ordering optical surface tracking for this patient's upcoming course of radiotherapy. ________________________________     Blair Promise, PhD, MD  This document serves as a record of services personally performed by Gery Pray, MD. It was created on his behalf by Wilburn Mylar, a trained medical scribe. The creation of this record is based on the scribe's personal observations and the provider's statements to them. This document has been checked and approved by the attending provider.

## 2018-11-27 DIAGNOSIS — Z170 Estrogen receptor positive status [ER+]: Secondary | ICD-10-CM | POA: Diagnosis not present

## 2018-11-27 DIAGNOSIS — Z510 Encounter for antineoplastic radiation therapy: Secondary | ICD-10-CM | POA: Diagnosis not present

## 2018-11-27 DIAGNOSIS — C50211 Malignant neoplasm of upper-inner quadrant of right female breast: Secondary | ICD-10-CM | POA: Diagnosis not present

## 2018-12-01 ENCOUNTER — Ambulatory Visit
Admission: RE | Admit: 2018-12-01 | Discharge: 2018-12-01 | Disposition: A | Discharge status: Home / Self Care | Payer: Medicare Other | Source: Ambulatory Visit | Attending: Radiation Oncology | Admitting: Radiation Oncology

## 2018-12-01 ENCOUNTER — Other Ambulatory Visit: Payer: Self-pay

## 2018-12-01 DIAGNOSIS — C50211 Malignant neoplasm of upper-inner quadrant of right female breast: Secondary | ICD-10-CM | POA: Diagnosis not present

## 2018-12-01 DIAGNOSIS — Z510 Encounter for antineoplastic radiation therapy: Secondary | ICD-10-CM | POA: Diagnosis not present

## 2018-12-01 DIAGNOSIS — Z170 Estrogen receptor positive status [ER+]: Secondary | ICD-10-CM | POA: Diagnosis not present

## 2018-12-01 NOTE — Progress Notes (Signed)
  Radiation Oncology         [phone] ________________________________  Name: Joan Lopez MRN: [id number]  Date: 12/01/2018  DOB: April 10, 1951  Simulation Verification Note    ICD-10-CM   1. Malignant neoplasm of upper-inner quadrant of right breast in female, estrogen receptor positive ([facility])  C50.211    Z17.0     Status: outpatient  NARRATIVE: The patient was brought to the treatment unit and placed in the planned treatment position. The clinical setup was verified. Then port films were obtained and uploaded to the radiation oncology medical record software.  The treatment beams were carefully compared against the planned radiation fields. The position location and shape of the radiation fields was reviewed. They targeted volume of tissue appears to be appropriately covered by the radiation beams. Organs at risk appear to be excluded as planned.  Based on my personal review, I approved the simulation verification. The patient's treatment will proceed as planned.  -----------------------------------  Blair Promise, PhD, MD  This document serves as a record of services personally performed by Gery Pray, MD. It was created on his behalf by Wilburn Mylar, a trained medical scribe. The creation of this record is based on the scribe's personal observations and the provider's statements to them. This document has been checked and approved by the attending provider.

## 2018-12-02 ENCOUNTER — Ambulatory Visit
Admission: RE | Admit: 2018-12-02 | Discharge: 2018-12-02 | Disposition: A | Discharge status: Home / Self Care | Payer: Medicare Other | Source: Ambulatory Visit | Attending: Radiation Oncology | Admitting: Radiation Oncology

## 2018-12-02 ENCOUNTER — Other Ambulatory Visit: Payer: Self-pay

## 2018-12-02 DIAGNOSIS — Z170 Estrogen receptor positive status [ER+]: Secondary | ICD-10-CM | POA: Diagnosis not present

## 2018-12-02 DIAGNOSIS — Z510 Encounter for antineoplastic radiation therapy: Secondary | ICD-10-CM | POA: Diagnosis not present

## 2018-12-02 DIAGNOSIS — C50211 Malignant neoplasm of upper-inner quadrant of right female breast: Secondary | ICD-10-CM

## 2018-12-02 MED ORDER — RADIAPLEXRX EX GEL
Freq: Once | CUTANEOUS | Status: AC
  Administered 2018-12-02: 15:00:00 via TOPICAL

## 2018-12-02 MED ORDER — ALRA NON-METALLIC DEODORANT (RAD-ONC)
1.0000 "application " | Freq: Once | TOPICAL | Status: AC
  Administered 2018-12-02: 1 via TOPICAL

## 2018-12-03 ENCOUNTER — Ambulatory Visit
Admission: RE | Admit: 2018-12-03 | Discharge: 2018-12-03 | Disposition: A | Discharge status: Home / Self Care | Payer: Medicare Other | Source: Ambulatory Visit | Attending: Radiation Oncology | Admitting: Radiation Oncology

## 2018-12-03 ENCOUNTER — Other Ambulatory Visit: Payer: Self-pay

## 2018-12-03 DIAGNOSIS — Z510 Encounter for antineoplastic radiation therapy: Secondary | ICD-10-CM | POA: Diagnosis not present

## 2018-12-03 DIAGNOSIS — C50211 Malignant neoplasm of upper-inner quadrant of right female breast: Secondary | ICD-10-CM | POA: Diagnosis not present

## 2018-12-03 DIAGNOSIS — Z170 Estrogen receptor positive status [ER+]: Secondary | ICD-10-CM | POA: Diagnosis not present

## 2018-12-04 ENCOUNTER — Ambulatory Visit
Admission: RE | Admit: 2018-12-04 | Discharge: 2018-12-04 | Disposition: A | Discharge status: Home / Self Care | Payer: Medicare Other | Source: Ambulatory Visit | Attending: Radiation Oncology | Admitting: Radiation Oncology

## 2018-12-04 ENCOUNTER — Other Ambulatory Visit: Payer: Self-pay

## 2018-12-04 DIAGNOSIS — C50211 Malignant neoplasm of upper-inner quadrant of right female breast: Secondary | ICD-10-CM | POA: Diagnosis not present

## 2018-12-04 DIAGNOSIS — Z170 Estrogen receptor positive status [ER+]: Secondary | ICD-10-CM | POA: Diagnosis not present

## 2018-12-04 DIAGNOSIS — Z510 Encounter for antineoplastic radiation therapy: Secondary | ICD-10-CM | POA: Diagnosis not present

## 2018-12-05 ENCOUNTER — Other Ambulatory Visit: Payer: Self-pay

## 2018-12-05 ENCOUNTER — Ambulatory Visit
Admission: RE | Admit: 2018-12-05 | Discharge: 2018-12-05 | Disposition: A | Discharge status: Home / Self Care | Payer: Medicare Other | Source: Ambulatory Visit | Attending: Radiation Oncology | Admitting: Radiation Oncology

## 2018-12-05 DIAGNOSIS — C50211 Malignant neoplasm of upper-inner quadrant of right female breast: Secondary | ICD-10-CM | POA: Diagnosis not present

## 2018-12-05 DIAGNOSIS — Z510 Encounter for antineoplastic radiation therapy: Secondary | ICD-10-CM | POA: Diagnosis not present

## 2018-12-05 DIAGNOSIS — Z170 Estrogen receptor positive status [ER+]: Secondary | ICD-10-CM | POA: Diagnosis not present

## 2018-12-08 ENCOUNTER — Other Ambulatory Visit: Payer: Self-pay

## 2018-12-08 ENCOUNTER — Ambulatory Visit
Admission: RE | Admit: 2018-12-08 | Discharge: 2018-12-08 | Disposition: A | Discharge status: Home / Self Care | Payer: Medicare Other | Source: Ambulatory Visit | Attending: Radiation Oncology | Admitting: Radiation Oncology

## 2018-12-08 DIAGNOSIS — Z510 Encounter for antineoplastic radiation therapy: Secondary | ICD-10-CM | POA: Diagnosis not present

## 2018-12-08 DIAGNOSIS — C50211 Malignant neoplasm of upper-inner quadrant of right female breast: Secondary | ICD-10-CM | POA: Diagnosis not present

## 2018-12-08 DIAGNOSIS — Z170 Estrogen receptor positive status [ER+]: Secondary | ICD-10-CM | POA: Diagnosis not present

## 2018-12-09 ENCOUNTER — Other Ambulatory Visit: Payer: Self-pay

## 2018-12-09 ENCOUNTER — Ambulatory Visit
Admission: RE | Admit: 2018-12-09 | Discharge: 2018-12-09 | Disposition: A | Discharge status: Home / Self Care | Payer: Medicare Other | Source: Ambulatory Visit | Attending: Radiation Oncology | Admitting: Radiation Oncology

## 2018-12-09 DIAGNOSIS — Z510 Encounter for antineoplastic radiation therapy: Secondary | ICD-10-CM | POA: Diagnosis not present

## 2018-12-09 DIAGNOSIS — Z170 Estrogen receptor positive status [ER+]: Secondary | ICD-10-CM | POA: Diagnosis not present

## 2018-12-09 DIAGNOSIS — C50211 Malignant neoplasm of upper-inner quadrant of right female breast: Secondary | ICD-10-CM | POA: Diagnosis not present

## 2018-12-10 ENCOUNTER — Ambulatory Visit
Admission: RE | Admit: 2018-12-10 | Discharge: 2018-12-10 | Disposition: A | Discharge status: Home / Self Care | Payer: Medicare Other | Source: Ambulatory Visit | Attending: Radiation Oncology | Admitting: Radiation Oncology

## 2018-12-10 ENCOUNTER — Other Ambulatory Visit: Payer: Self-pay

## 2018-12-10 DIAGNOSIS — Z170 Estrogen receptor positive status [ER+]: Secondary | ICD-10-CM | POA: Diagnosis not present

## 2018-12-10 DIAGNOSIS — C50211 Malignant neoplasm of upper-inner quadrant of right female breast: Secondary | ICD-10-CM | POA: Diagnosis not present

## 2018-12-10 DIAGNOSIS — Z510 Encounter for antineoplastic radiation therapy: Secondary | ICD-10-CM | POA: Diagnosis not present

## 2018-12-11 ENCOUNTER — Other Ambulatory Visit: Payer: Self-pay

## 2018-12-11 ENCOUNTER — Ambulatory Visit
Admission: RE | Admit: 2018-12-11 | Discharge: 2018-12-11 | Disposition: A | Discharge status: Home / Self Care | Payer: Medicare Other | Source: Ambulatory Visit | Attending: Radiation Oncology | Admitting: Radiation Oncology

## 2018-12-11 DIAGNOSIS — C50211 Malignant neoplasm of upper-inner quadrant of right female breast: Secondary | ICD-10-CM | POA: Diagnosis not present

## 2018-12-11 DIAGNOSIS — Z170 Estrogen receptor positive status [ER+]: Secondary | ICD-10-CM | POA: Diagnosis not present

## 2018-12-11 DIAGNOSIS — Z510 Encounter for antineoplastic radiation therapy: Secondary | ICD-10-CM | POA: Diagnosis not present

## 2018-12-12 ENCOUNTER — Ambulatory Visit
Admission: RE | Admit: 2018-12-12 | Discharge: 2018-12-12 | Disposition: A | Discharge status: Home / Self Care | Payer: Medicare Other | Source: Ambulatory Visit | Attending: Radiation Oncology | Admitting: Radiation Oncology

## 2018-12-12 ENCOUNTER — Other Ambulatory Visit: Payer: Self-pay

## 2018-12-12 DIAGNOSIS — C50211 Malignant neoplasm of upper-inner quadrant of right female breast: Secondary | ICD-10-CM | POA: Diagnosis not present

## 2018-12-12 DIAGNOSIS — Z510 Encounter for antineoplastic radiation therapy: Secondary | ICD-10-CM | POA: Diagnosis not present

## 2018-12-12 DIAGNOSIS — Z170 Estrogen receptor positive status [ER+]: Secondary | ICD-10-CM | POA: Diagnosis not present

## 2018-12-15 ENCOUNTER — Ambulatory Visit
Admission: RE | Admit: 2018-12-15 | Discharge: 2018-12-15 | Disposition: A | Discharge status: Home / Self Care | Payer: Medicare Other | Source: Ambulatory Visit | Attending: Radiation Oncology | Admitting: Radiation Oncology

## 2018-12-15 ENCOUNTER — Other Ambulatory Visit: Payer: Self-pay

## 2018-12-15 DIAGNOSIS — Z170 Estrogen receptor positive status [ER+]: Secondary | ICD-10-CM | POA: Diagnosis not present

## 2018-12-15 DIAGNOSIS — C50211 Malignant neoplasm of upper-inner quadrant of right female breast: Secondary | ICD-10-CM | POA: Diagnosis not present

## 2018-12-15 DIAGNOSIS — Z510 Encounter for antineoplastic radiation therapy: Secondary | ICD-10-CM | POA: Diagnosis not present

## 2018-12-16 ENCOUNTER — Ambulatory Visit
Admission: RE | Admit: 2018-12-16 | Discharge: 2018-12-16 | Disposition: A | Discharge status: Home / Self Care | Payer: Medicare Other | Source: Ambulatory Visit | Attending: Radiation Oncology | Admitting: Radiation Oncology

## 2018-12-16 ENCOUNTER — Other Ambulatory Visit: Payer: Self-pay

## 2018-12-16 ENCOUNTER — Ambulatory Visit: Payer: Medicare Other | Admitting: Radiation Oncology

## 2018-12-16 DIAGNOSIS — Z510 Encounter for antineoplastic radiation therapy: Secondary | ICD-10-CM | POA: Diagnosis not present

## 2018-12-16 DIAGNOSIS — Z170 Estrogen receptor positive status [ER+]: Secondary | ICD-10-CM | POA: Insufficient documentation

## 2018-12-16 DIAGNOSIS — C50211 Malignant neoplasm of upper-inner quadrant of right female breast: Secondary | ICD-10-CM | POA: Diagnosis not present

## 2018-12-17 ENCOUNTER — Ambulatory Visit: Payer: Medicare Other | Admitting: Radiation Oncology

## 2018-12-17 ENCOUNTER — Ambulatory Visit
Admission: RE | Admit: 2018-12-17 | Discharge: 2018-12-17 | Disposition: A | Discharge status: Home / Self Care | Payer: Medicare Other | Source: Ambulatory Visit | Attending: Radiation Oncology | Admitting: Radiation Oncology

## 2018-12-17 ENCOUNTER — Other Ambulatory Visit: Payer: Self-pay

## 2018-12-17 DIAGNOSIS — C50211 Malignant neoplasm of upper-inner quadrant of right female breast: Secondary | ICD-10-CM | POA: Diagnosis not present

## 2018-12-17 DIAGNOSIS — Z170 Estrogen receptor positive status [ER+]: Secondary | ICD-10-CM | POA: Diagnosis not present

## 2018-12-17 DIAGNOSIS — Z510 Encounter for antineoplastic radiation therapy: Secondary | ICD-10-CM | POA: Diagnosis not present

## 2018-12-18 ENCOUNTER — Other Ambulatory Visit: Payer: Self-pay

## 2018-12-18 ENCOUNTER — Ambulatory Visit
Admission: RE | Admit: 2018-12-18 | Discharge: 2018-12-18 | Disposition: A | Discharge status: Home / Self Care | Payer: Medicare Other | Source: Ambulatory Visit | Attending: Radiation Oncology | Admitting: Radiation Oncology

## 2018-12-18 ENCOUNTER — Ambulatory Visit: Admission: RE | Admit: 2018-12-18 | Payer: Medicare Other | Source: Ambulatory Visit | Admitting: Radiation Oncology

## 2018-12-18 DIAGNOSIS — C50211 Malignant neoplasm of upper-inner quadrant of right female breast: Secondary | ICD-10-CM | POA: Diagnosis not present

## 2018-12-18 DIAGNOSIS — Z170 Estrogen receptor positive status [ER+]: Secondary | ICD-10-CM | POA: Diagnosis not present

## 2018-12-18 DIAGNOSIS — Z510 Encounter for antineoplastic radiation therapy: Secondary | ICD-10-CM | POA: Diagnosis not present

## 2018-12-19 ENCOUNTER — Ambulatory Visit
Admission: RE | Admit: 2018-12-19 | Discharge: 2018-12-19 | Disposition: A | Discharge status: Home / Self Care | Payer: Medicare Other | Source: Ambulatory Visit | Attending: Radiation Oncology | Admitting: Radiation Oncology

## 2018-12-19 ENCOUNTER — Other Ambulatory Visit: Payer: Self-pay

## 2018-12-19 DIAGNOSIS — Z170 Estrogen receptor positive status [ER+]: Secondary | ICD-10-CM | POA: Diagnosis not present

## 2018-12-19 DIAGNOSIS — Z510 Encounter for antineoplastic radiation therapy: Secondary | ICD-10-CM | POA: Diagnosis not present

## 2018-12-19 DIAGNOSIS — C50211 Malignant neoplasm of upper-inner quadrant of right female breast: Secondary | ICD-10-CM | POA: Diagnosis not present

## 2018-12-23 ENCOUNTER — Ambulatory Visit
Admission: RE | Admit: 2018-12-23 | Discharge: 2018-12-23 | Disposition: A | Discharge status: Home / Self Care | Payer: Medicare Other | Source: Ambulatory Visit | Attending: Radiation Oncology | Admitting: Radiation Oncology

## 2018-12-23 ENCOUNTER — Other Ambulatory Visit: Payer: Self-pay

## 2018-12-23 DIAGNOSIS — C50211 Malignant neoplasm of upper-inner quadrant of right female breast: Secondary | ICD-10-CM

## 2018-12-23 DIAGNOSIS — Z510 Encounter for antineoplastic radiation therapy: Secondary | ICD-10-CM | POA: Diagnosis not present

## 2018-12-23 DIAGNOSIS — Z170 Estrogen receptor positive status [ER+]: Secondary | ICD-10-CM | POA: Diagnosis not present

## 2018-12-23 NOTE — Progress Notes (Signed)
.  Simulation verification  The patient was brought to the treatment machine and placed in the plan treatment position.  Clinical set up was verified to ensure that the target region is appropriately covered for the patient's upcoming electron boost treatment.  The targeted volume of tissue is appropriately covered by the radiation field.  Based on my personal review, I approve the simulation verification.  The patient's treatment will proceed as planned.  ------------------------------------------------  -----------------------------------  Trea Latner D. Dodd Schmid, PhD, MD  

## 2018-12-24 ENCOUNTER — Ambulatory Visit
Admission: RE | Admit: 2018-12-24 | Discharge: 2018-12-24 | Disposition: A | Discharge status: Home / Self Care | Payer: Medicare Other | Source: Ambulatory Visit | Attending: Radiation Oncology | Admitting: Radiation Oncology

## 2018-12-24 ENCOUNTER — Other Ambulatory Visit: Payer: Self-pay

## 2018-12-24 DIAGNOSIS — Z510 Encounter for antineoplastic radiation therapy: Secondary | ICD-10-CM | POA: Diagnosis not present

## 2018-12-24 DIAGNOSIS — Z170 Estrogen receptor positive status [ER+]: Secondary | ICD-10-CM | POA: Diagnosis not present

## 2018-12-24 DIAGNOSIS — C50211 Malignant neoplasm of upper-inner quadrant of right female breast: Secondary | ICD-10-CM | POA: Diagnosis not present

## 2018-12-25 ENCOUNTER — Ambulatory Visit
Admission: RE | Admit: 2018-12-25 | Discharge: 2018-12-25 | Disposition: A | Discharge status: Home / Self Care | Payer: Medicare Other | Source: Ambulatory Visit | Attending: Radiation Oncology | Admitting: Radiation Oncology

## 2018-12-25 DIAGNOSIS — Z170 Estrogen receptor positive status [ER+]: Secondary | ICD-10-CM | POA: Diagnosis not present

## 2018-12-25 DIAGNOSIS — C50211 Malignant neoplasm of upper-inner quadrant of right female breast: Secondary | ICD-10-CM | POA: Diagnosis not present

## 2018-12-25 DIAGNOSIS — Z510 Encounter for antineoplastic radiation therapy: Secondary | ICD-10-CM | POA: Diagnosis not present

## 2018-12-26 ENCOUNTER — Ambulatory Visit
Admission: RE | Admit: 2018-12-26 | Discharge: 2018-12-26 | Disposition: A | Discharge status: Home / Self Care | Payer: Medicare Other | Source: Ambulatory Visit | Attending: Radiation Oncology | Admitting: Radiation Oncology

## 2018-12-26 ENCOUNTER — Other Ambulatory Visit: Payer: Self-pay

## 2018-12-26 DIAGNOSIS — Z510 Encounter for antineoplastic radiation therapy: Secondary | ICD-10-CM | POA: Diagnosis not present

## 2018-12-26 DIAGNOSIS — C50211 Malignant neoplasm of upper-inner quadrant of right female breast: Secondary | ICD-10-CM | POA: Diagnosis not present

## 2018-12-26 DIAGNOSIS — Z170 Estrogen receptor positive status [ER+]: Secondary | ICD-10-CM | POA: Diagnosis not present

## 2018-12-29 ENCOUNTER — Ambulatory Visit
Admission: RE | Admit: 2018-12-29 | Discharge: 2018-12-29 | Disposition: A | Discharge status: Home / Self Care | Payer: Medicare Other | Source: Ambulatory Visit | Attending: Radiation Oncology | Admitting: Radiation Oncology

## 2018-12-29 ENCOUNTER — Other Ambulatory Visit: Payer: Self-pay

## 2018-12-29 ENCOUNTER — Encounter: Payer: Self-pay | Admitting: Radiation Oncology

## 2018-12-29 ENCOUNTER — Encounter: Payer: Self-pay | Admitting: *Deleted

## 2018-12-29 DIAGNOSIS — Z510 Encounter for antineoplastic radiation therapy: Secondary | ICD-10-CM | POA: Diagnosis not present

## 2018-12-29 DIAGNOSIS — Z170 Estrogen receptor positive status [ER+]: Secondary | ICD-10-CM | POA: Diagnosis not present

## 2018-12-29 DIAGNOSIS — C50211 Malignant neoplasm of upper-inner quadrant of right female breast: Secondary | ICD-10-CM | POA: Diagnosis not present

## 2019-01-09 ENCOUNTER — Telehealth: Payer: Self-pay | Admitting: *Deleted

## 2019-01-09 NOTE — Telephone Encounter (Signed)
CALLED PATIENT TO INFORM OF FU WITH DR. Santiago Glad ON 01-29-19 @ 10:30 AM, SPOKE WITH PATIENT AND SHE IS AWARE OF THIS APPT.

## 2019-01-20 NOTE — Progress Notes (Signed)
[place]  [phone] [phone]     ID: Joan Lopez DOB: 02-21-1951  MR#: [id number]  UUV#:[id number]  Patient Care Team: Silverio Decamp, MD as PCP - General (Sports Medicine) Fanny Skates, MD as Consulting Physician (General Surgery) , Virgie Dad, MD as Consulting Physician (Oncology) Bunnie Pion, MD as Consulting Physician (Ophthalmology) Chauncey Cruel, MD OTHER MD:  CHIEF COMPLAINT: Estrogen receptor positive breast cancer  CURRENT TREATMENT: Tamoxifen   INTERVAL HISTORY: Joan Lopez returns today for follow up of her estrogen receptor positive breast cancer.  Since her last visit, she underwent right breast lumpectomy with sentinel lymph node biopsy on 10/24/2018 under Dr. Dalbert Batman. Pathology from the procedure showed: invasive ductal carcinoma with associated calcifications, 0.9 cm, grade 1; margins uninvolved; three negative lymph nodes (0/3).  She underwent genetic counseling on 10/30/2018.  No deleterious mutations were found.  She also received radiation therapy to the right breast from 12/01/2018 to 12/29/2018. She tolerated this well, with only slight reddening of the skin, which quickly resolved.  She is now ready to start antiestrogens.   REVIEW OF SYSTEMS: [place] still has some sensitivity and soreness and occasionally slight shooting pains in the right breast.  She understands this is not uncommon and is "the new normal".  These may disappear or not and may recur or not.  She is back to running for exercise.  When she could not run because of bra was a little bit uncomfortable she did her bike on a daily basis.  A detailed review of systems today was otherwise stable.   HISTORY OF CURRENT ILLNESS: From the original intake note:  Joan Lopez had routine screening mammography on 09/19/2018 showing a possible abnormality in the right breast. She underwent bilateral diagnostic mammography with  tomography and right breast ultrasonography at [facility] on 09/25/2018 showing: breast density category B; suspicious right breast mass in the 1 o'clock position 9 cm from the nipple measuring 6 mm; indeterminate right breast mass in the 12 o'clock position 6 cm from the nipple measuring 9 mm; no suspicious right axillary lymphadenopathy.  Accordingly on 09/26/2018 she proceeded to biopsy of the right breast areas in question. 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 Prognostic indicators significant for: estrogen receptor, 100% positive and progesterone receptor, 10% positive, both with strong staining intensity. Proliferation marker Ki67 at 5%. HER2 equivocal by immunohistochemistry (2+), but negative by fluorescent in situ hybridization with a signals ratio 1.16 and number per cell 1.80.  Biopsy of the right breast 12 o'clock location from the same reports showed fibroadenoma, sclerotic, with no malignancy identified.  The patient's subsequent history is as detailed below.   PAST MEDICAL HISTORY: Past Medical History:  Diagnosis Date  . Abnormal LFTs 10/29/2012  . Acquired bilateral renal cysts   . Arthritis   . Bilateral bunions   . Constipation 10/16/2011  . DDD (degenerative disc disease)    spine w narrowing  . Dehydration, mild 12/26/2011  . Diverticulitis   . Exercise counseling 11/14/2011  . Family history of brain cancer   . Family history of breast cancer   . Family history of cancer   . Family history of colon cancer   . Family history of kidney cancer   . Family history of oral cancer   . Family history of stomach cancer   . Fuch's endothelial dystrophy   . Guttate  psoriasis   . Hematuria 08/24/2011  . Hemorrhoids   . History of chicken pox   . Hyperlipidemia   . Kidney stones   . Lipoma of arm 07/13/2012  . Lipoma of arm 10/07/2012   Left   . Mass of arm  07/13/2012   Has had them in past    . 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 Post-menopausal bleeding 10/08/2011  . Reflux 04/17/2012  . Renal insufficiency 01/11/2013  . Thrombocytopenia ([place]) 09/27/2011    PAST SURGICAL HISTORY: Past Surgical History:  Procedure Laterality Date  . APPENDECTOMY  1986  . BREAST LUMPECTOMY WITH RADIOACTIVE SEED AND SENTINEL LYMPH NODE BIOPSY Right 10/24/2018   Procedure: RIGHT BREAST LUMPECTOMY WITH RADIOACTIVE SEED AND RIGHT AXILLARY SENTINEL LYMPH NODE BIOPSY, RIGHT BLUE DYE INJECTION;  Surgeon: Fanny Skates, MD;  Location: [facility];  Service: General;  Laterality: Right;  PEC BLOCK  . CESAREAN SECTION    . CHOLECYSTECTOMY    . COLONOSCOPY  2005   (records here)[place] - diverticulosis and hemorrhoids  . CORNEAL TRANSPLANT  2013  . CYSTOSCOPY WITH RETROGRADE PYELOGRAM, URETEROSCOPY AND STENT PLACEMENT Right 10/22/2013   Procedure: CYSTOSCOPY WITH RETROGRADE PYELOGRAM, URETEROSCOPY ;  Surgeon: Raynelle Bring, MD;  Location: [facility];  Service: Urology;  Laterality: Right;  . LIPOMA EXCISION  2014   x 3, both arms  . TONSILLECTOMY     possible adenoids  . TOTAL ABDOMINAL HYSTERECTOMY  1991   for fibroids  . TUBAL LIGATION    . WRIST FRACTURE SURGERY     right, titanium plate and pins    FAMILY HISTORY: Family History  Problem Relation Age of Onset  . Stroke Mother   . Heart disease Mother   . Hypertension Mother   . Kidney disease Father   . Breast cancer Sister 63  . Cancer Sister 33       ductal carcinoma breast- had it in 1999 and just found out its in the opposite breast  . Brain cancer Maternal Grandmother 55       brain tumor  . Breast cancer Paternal Grandmother 14       post menopausal   . Pneumonia Maternal Grandfather   . Colon cancer Maternal Aunt 61       40s or 40s  . Cancer Paternal Uncle 64       neck tumor  . Stomach cancer Maternal Aunt 80       80s  . Kidney cancer Cousin 81       30s, maternal first  cousin  . Cancer Paternal Aunt        blood cancer, unknown but older age at diagnosis  . Cancer Maternal Aunt 70       unknown, but not breast, 101s  . Cancer Cousin        oral cancer diagnosed older, maternal first cousin  . Esophageal cancer Neg Hx   . Pancreatic cancer Neg Hx   . Rectal cancer Neg Hx    Patient's father was 64 years old when he died from kidney disease. Patient's mother died from old age at age 19, possibly something related to her colon.  The patient has 1 sister, no brothers.  Patient has a family history of breast cancer in her sister, diagnosed in her 30s with recurrence more than 5 years later, and in her paternal grandmother. Both were diagnosed following menopause. Her sister underwent bilateral mastectomies. The patient denies a family hx of ovarian, prostate,  or pancreatic cancer.  She also notes: colon cancer in a maternal aunt at age 98; stomach cancer in another maternal aunt; a neck tumor in a paternal uncle; a brain tumor in her maternal grandfather.   GYNECOLOGIC HISTORY:  No LMP recorded. Patient has had a hysterectomy. Menarche: 68 years old Age at first live birth: 68 years old [place] P 1 LMP 1991 Contraceptive: IUD, remote HRT estrogen approximately one month Hysterectomy? Yes, 1991 BSO? yes   SOCIAL HISTORY: (updated 10/08/2018)  Joan Lopez is currently retired from working as a Chief Executive Officer in [place]. She is married. Husband Stefanie Libel") is a retired Engineer, structural. He has significant health issues. The patient lives at home with husband Barnabas Lister. Daughter Gay Filler is a Associate Professor at [employer] and has two daughters, Harmon Pier and Chrys Racer (the patient's granddaughters). Chrys Racer is very interested in medicine.  The patient is not a church attender    ADVANCED DIRECTIVES: Currently, husband Barnabas Lister is automaticcaly her HCPOA.   HEALTH MAINTENANCE: Social History   Tobacco Use  . Smoking status: Never Smoker  . Smokeless tobacco: Never Used  Substance  Use Topics  . Alcohol use: No  . Drug use: No     Colonoscopy: 09/2013, Dr. Carlean Purl, repeat 2025  PAP: 09/2011, normal  Bone density: 05/2014, -2.2   Allergies  Allergen Reactions  . Augmentin [Amoxicillin-Pot Clavulanate]     Severe yeast infection-would prefer not to take.    Current Outpatient Medications  Medication Sig Dispense Refill  . calcium carbonate (OSCAL) 1500 (600 Ca) MG TABS tablet Take 600 mg of elemental calcium by mouth daily with breakfast.    . loteprednol (LOTEMAX) 0.5 % ophthalmic suspension Place 1 drop into both eyes as directed. Reported on 06/07/2015     No current facility-administered medications for this visit.     OBJECTIVE: Middle-aged white woman in no acute distress  Vitals:   01/21/19 1317  BP: 129/86  Pulse: 78  Resp: 18  Temp: 98.9 F (37.2 C)  SpO2: 98%     Body mass index is 22.38 kg/m.   Wt Readings from Last 3 Encounters:  01/21/19 114 lb 9.6 oz (52 kg)  11/03/18 125 lb (56.7 kg)  10/24/18 124 lb 5.4 oz (56.4 kg)      ECOG FS:1 - Symptomatic but completely ambulatory   Sclerae unicteric, EOMs intact Wearing a mask No cervical or supraclavicular adenopathy Lungs no rales or rhonchi Heart regular rate and rhythm Abd soft, nontender, positive bowel sounds MSK no focal spinal tenderness, no upper extremity lymphedema Neuro: nonfocal, well oriented, appropriate affect Breasts: The right breast is status post lumpectomy and radiation.  The cosmetic result is excellent.  There is no evidence of local recurrence.  The left breast is benign.  Both axillae are benign.  LAB RESULTS:  CMP     Component Value Date/Time   NA 143 01/21/2019 1304   K 4.8 01/21/2019 1304   CL 104 01/21/2019 1304   CO2 30 01/21/2019 1304   GLUCOSE 91 01/21/2019 1304   BUN 16 01/21/2019 1304   CREATININE 0.74 01/21/2019 1304   CREATININE 0.76 10/11/2017 1007   CALCIUM 9.6 01/21/2019 1304   PROT 7.3 01/21/2019 1304   ALBUMIN 4.1 01/21/2019 1304    AST 30 01/21/2019 1304   ALT 17 01/21/2019 1304   ALKPHOS 62 01/21/2019 1304   BILITOT 0.8 01/21/2019 1304   GFRNONAA >60 01/21/2019 1304   GFRNONAA >89 04/02/2011 1037   GFRAA >60 01/21/2019 1304  GFRAA >89 04/02/2011 1037    No results found for: TOTALPROTELP, ALBUMINELP, A1GS, A2GS, BETS, BETA2SER, GAMS, MSPIKE, SPEI  No results found for: Nils Pyle, [facility]  Lab Results  Component Value Date   WBC 4.2 01/21/2019   NEUTROABS 2.5 01/21/2019   HGB 14.7 01/21/2019   HCT 43.0 01/21/2019   MCV 88.8 01/21/2019   PLT 141 (L) 01/21/2019    @LASTCHEMISTRY @  No results found for: LABCA2  No components found for: [id number]  No results for input(s): INR in the last 168 hours.  No results found for: LABCA2  No results found for: YBW389  No results found for: HTD428  No results found for: JGO115  No results found for: CA2729  No components found for: HGQUANT  No results found for: CEA1 / No results found for: CEA1   No results found for: AFPTUMOR  No results found for: CHROMOGRNA  No results found for: PSA1  Appointment on 01/21/2019  Component Date Value Ref Range Status  . Sodium 01/21/2019 143  135 - 145 mmol/L Final  . Potassium 01/21/2019 4.8  3.5 - 5.1 mmol/L Final  . Chloride 01/21/2019 104  98 - 111 mmol/L Final  . CO2 01/21/2019 30  22 - 32 mmol/L Final  . Glucose, Bld 01/21/2019 91  70 - 99 mg/dL Final  . BUN 01/21/2019 16  8 - 23 mg/dL Final  . Creatinine, Ser 01/21/2019 0.74  0.44 - 1.00 mg/dL Final  . Calcium 01/21/2019 9.6  8.9 - 10.3 mg/dL Final  . Total Protein 01/21/2019 7.3  6.5 - 8.1 g/dL Final  . Albumin 01/21/2019 4.1  3.5 - 5.0 g/dL Final  . AST 01/21/2019 30  15 - 41 U/L Final  . ALT 01/21/2019 17  0 - 44 U/L Final  . Alkaline Phosphatase 01/21/2019 62  38 - 126 U/L Final  . Total Bilirubin 01/21/2019 0.8  0.3 - 1.2 mg/dL Final  . GFR calc non Af Amer 01/21/2019 >60  >60 mL/min Final  . GFR calc Af Amer 01/21/2019  >60  >60 mL/min Final  . Anion gap 01/21/2019 9  5 - 15 Final   Performed at [facility], [place] [address], [place], [place] [address]  . WBC 01/21/2019 4.2  4.0 - 10.5 K/uL Final  . RBC 01/21/2019 4.84  3.87 - 5.11 MIL/uL Final  . Hemoglobin 01/21/2019 14.7  12.0 - 15.0 g/dL Final  . HCT 01/21/2019 43.0  36.0 - 46.0 % Final  . MCV 01/21/2019 88.8  80.0 - 100.0 fL Final  . MCH 01/21/2019 30.4  26.0 - 34.0 pg Final  . MCHC 01/21/2019 34.2  30.0 - 36.0 g/dL Final  . RDW 01/21/2019 12.1  11.5 - 15.5 % Final  . Platelets 01/21/2019 141* 150 - 400 K/uL Final  . nRBC 01/21/2019 0.0  0.0 - 0.2 % Final  . Neutrophils Relative % 01/21/2019 60  % Final  . Neutro Abs 01/21/2019 2.5  1.7 - 7.7 K/uL Final  . Lymphocytes Relative 01/21/2019 25  % Final  . Lymphs Abs 01/21/2019 1.1  0.7 - 4.0 K/uL Final  . Monocytes Relative 01/21/2019 13  % Final  . Monocytes Absolute 01/21/2019 0.6  0.1 - 1.0 K/uL Final  . Eosinophils Relative 01/21/2019 1  % Final  . Eosinophils Absolute 01/21/2019 0.1  0.0 - 0.5 K/uL Final  . Basophils Relative 01/21/2019 1  % Final  . Basophils Absolute 01/21/2019 0.0  0.0 - 0.1 K/uL Final  .  Immature Granulocytes 01/21/2019 0  % Final  . Abs Immature Granulocytes 01/21/2019 0.01  0.00 - 0.07 K/uL Final   Performed at [facility], [address] [place], [place] [address]    (this displays the last labs from the last 3 days)  No results found for: TOTALPROTELP, ALBUMINELP, A1GS, A2GS, BETS, BETA2SER, GAMS, MSPIKE, SPEI (this displays SPEP labs)  No results found for: KPAFRELGTCHN, LAMBDASER, KAPLAMBRATIO (kappa/lambda light chains)  No results found for: HGBA, HGBA2QUANT, HGBFQUANT, HGBSQUAN (Hemoglobinopathy evaluation)   No results found for: LDH  No results found for: IRON, TIBC, IRONPCTSAT (Iron and TIBC)  No results found for: FERRITIN  Urinalysis    Component Value Date/Time   COLORURINE YELLOW 10/07/2012  [place] 10/07/2012 1331   LABSPEC 1.020 10/07/2012 1331   PHURINE 6.0 10/07/2012 1331   GLUCOSEU NEG 10/07/2012 1331   GLUCOSEU NEG mg/dL 04/02/2007 2006   HGBUR NEG 10/07/2012 1331   HGBUR large 07/25/2009 1427   BILIRUBINUR neg 03/01/2015 1201   KETONESUR NEG 10/07/2012 1331   PROTEINUR neg 03/01/2015 1201   PROTEINUR NEG 10/07/2012 1331   UROBILINOGEN 0.2 03/01/2015 1201   UROBILINOGEN 0.2 10/07/2012 1331   NITRITE neg 03/01/2015 1201   NITRITE NEG 10/07/2012 1331   LEUKOCYTESUR Negative 03/01/2015 1201     STUDIES: No results found.  ELIGIBLE FOR AVAILABLE RESEARCH PROTOCOL: no  ASSESSMENT: 68 y.o. [place], [place] woman status post right breast upper outer quadrant biopsy 09/26/2018 for a clinical T1BN0, stage Ia invasive ductal carcinoma, grade 1, estrogen and progesterone receptor positive, HER-2 negative, with an MIB-1 of 5%  (1) status post right lumpectomy 10/24/2018 for a pT1b pN0, stage IA invasive ductal carcinoma, grade 1, with negative margins  (a) a total of 3 right axillary sentinel lymph nodes removed  (2) no chemotherapy planned, no Oncotype needed  (3) adjuvant radiation 12/01/2018 - 12/29/2018  (a) Right Breast / 40.05 Gy in 15 fractions  (b) Right Breast Boost / 10 Gy in 5 fractions  (4) antiestrogens  (a) bone density 05/19/2014 showed a T score of -2.2  (b) patient is status post remote hysterectomy.  (5) genetics testing 10/30/2018  (a) Negative genetic testing on the [facility] Multi-Cancer panel.  The report date is 11/06/2018.  The Multi-Gene Panel offered by [facility] includes sequencing and/or deletion duplication testing of the following 85 genes: AIP, ALK, APC, ATM, AXIN2,BAP1,  BARD1, BLM, BMPR1A, BRCA1, BRCA2, BRIP1, CASR, CDC73, CDH1, CDK4, CDKN1B, CDKN1C, CDKN2A (p14ARF), CDKN2A (p16INK4a), CEBPA, CHEK2, CTNNA1, DICER1, DIS3L2, EGFR (c.2369C>T, p.Thr790Met variant only), EPCAM (Deletion/duplication testing only), FH, FLCN, GATA2,  GPC3, GREM1 (Promoter region deletion/duplication testing only), HOXB13 (c.251G>A, p.Gly84Glu), HRAS, KIT, MAX, MEN1, MET, MITF (c.952G>A, p.Glu318Lys variant only), MLH1, MSH2, MSH3, MSH6, MUTYH, NBN, NF1, NF2, NTHL1, PALB2, PDGFRA, PHOX2B, PMS2, POLD1, POLE, POT1, PRKAR1A, PTCH1, PTEN, RAD50, RAD51C, RAD51D, RB1, RECQL4, RET, RNF43, RUNX1, SDHAF2, SDHA (sequence changes only), SDHB, SDHC, SDHD, SMAD4, SMARCA4, SMARCB1, SMARCE1, STK11, SUFU, TERC, TERT, TMEM127, TP53, TSC1, TSC2, VHL, WRN and WT1.      PLAN: Joan Lopez has completed local treatment for breast cancer and is now ready to discuss antiestrogens which will be her only systemic therapy.  She understands the rationale for this and the fact that antiestrogens taken for 5 years will cut in half her risk of this cancer recurring and also the risk of her developing a new breast cancer in either breast in the future  We discussed the difference between tamoxifen and  anastrozole in detail. She understands that anastrozole and the aromatase inhibitors in general work by blocking estrogen production. Accordingly vaginal dryness, decrease in bone density, and of course hot flashes can result. The aromatase inhibitors can also negatively affect the cholesterol profile, although that is a minor effect. One out of 5 women on aromatase inhibitors we will feel "old and achy". This arthralgia/myalgia syndrome, which resembles fibromyalgia clinically, does resolve with stopping the medications. Accordingly this is not a reason to not try an aromatase inhibitor but it is a frequent reason to stop it (in other words 20% of women will not be able to tolerate these medications).  Tamoxifen on the other hand does not block estrogen production. It does not "take away a woman's estrogen". It blocks the estrogen receptor in breast cells. Like anastrozole, it can also cause hot flashes. As opposed to anastrozole, tamoxifen has many estrogen-like effects. It is technically an  estrogen receptor modulator. This means that in some tissues tamoxifen works like estrogen-- for example it helps strengthen the bones. It tends to improve the cholesterol profile. It can cause thickening of the endometrial lining, and even endometrial polyps or rarely cancer of the uterus.(The risk of uterine cancer due to tamoxifen is one additional cancer per thousand women year). It can cause vaginal wetness or stickiness. It can cause blood clots through this estrogen-like effect--the risk of blood clots with tamoxifen is exactly the same as with birth control pills or hormone replacement.  Neither of these agents causes mood changes or weight gain, despite the popular belief that they can have these side effects. We have data from studies comparing either of these drugs with placebo, and in those cases the control group had the same amount of weight gain and depression as the group that took the drug.  With all this in mind given the fact that she has significant osteopenia, close to osteoporosis, and that she is status post hysterectomy, I think tamoxifen would be a better choice for her.  We did discuss these possible symptoms of clots in detail so she can alert [name] but she is not overweight, does not smoke, and is extremely active.  She also took oral contraceptives remotely with no clotting complications  Accordingly she is going on tamoxifen now.  She will see me again in about 3 months.  If she tolerates tamoxifen well the plan will be to continue that a total of 5 years  She knows to call for any other issue that may develop before the next visit.   Chauncey Cruel, MD   01/21/2019 2:01 PM Medical Oncology and Hematology [facility] [address] [place], [place] [address] Tel. [phone]    Fax. [phone]   This document serves as a record of services personally performed by Lurline Del, MD. It was created on his behalf by Wilburn Mylar, a trained medical  scribe. The creation of this record is based on the scribe's personal observations and the provider's statements to them.   I, Lurline Del MD, have reviewed the above documentation for accuracy and completeness, and I agree with the above.

## 2019-01-21 ENCOUNTER — Other Ambulatory Visit: Payer: Self-pay

## 2019-01-21 ENCOUNTER — Inpatient Hospital Stay: Payer: Medicare Other | Attending: Oncology | Admitting: Oncology

## 2019-01-21 ENCOUNTER — Inpatient Hospital Stay: Payer: Medicare Other

## 2019-01-21 VITALS — BP 129/86 | HR 78 | Temp 98.9°F | Resp 18 | Ht 60.0 in | Wt 114.6 lb

## 2019-01-21 DIAGNOSIS — Z923 Personal history of irradiation: Secondary | ICD-10-CM | POA: Diagnosis not present

## 2019-01-21 DIAGNOSIS — Z170 Estrogen receptor positive status [ER+]: Secondary | ICD-10-CM | POA: Diagnosis not present

## 2019-01-21 DIAGNOSIS — M8580 Other specified disorders of bone density and structure, unspecified site: Secondary | ICD-10-CM | POA: Insufficient documentation

## 2019-01-21 DIAGNOSIS — C50211 Malignant neoplasm of upper-inner quadrant of right female breast: Secondary | ICD-10-CM | POA: Insufficient documentation

## 2019-01-21 DIAGNOSIS — D696 Thrombocytopenia, unspecified: Secondary | ICD-10-CM

## 2019-01-21 DIAGNOSIS — Z79810 Long term (current) use of selective estrogen receptor modulators (SERMs): Secondary | ICD-10-CM | POA: Diagnosis not present

## 2019-01-21 LAB — COMPREHENSIVE METABOLIC PANEL
ALT: 17 U/L (ref 0–44)
AST: 30 U/L (ref 15–41)
Albumin: 4.1 g/dL (ref 3.5–5.0)
Alkaline Phosphatase: 62 U/L (ref 38–126)
Anion gap: 9 (ref 5–15)
BUN: 16 mg/dL (ref 8–23)
CO2: 30 mmol/L (ref 22–32)
Calcium: 9.6 mg/dL (ref 8.9–10.3)
Chloride: 104 mmol/L (ref 98–111)
Creatinine, Ser: 0.74 mg/dL (ref 0.44–1.00)
GFR calc Af Amer: >60 mL/min (ref >60)
GFR calc non Af Amer: >60 mL/min (ref >60)
Glucose, Bld: 91 mg/dL (ref 70–99)
Potassium: 4.8 mmol/L (ref 3.5–5.1)
Sodium: 143 mmol/L (ref 135–145)
Total Bilirubin: 0.8 mg/dL (ref 0.3–1.2)
Total Protein: 7.3 g/dL (ref 6.5–8.1)

## 2019-01-21 LAB — CBC WITH DIFFERENTIAL/PLATELET
Abs Immature Granulocytes: 0.01 10*3/uL (ref 0.00–0.07)
Basophils Absolute: 0 10*3/uL (ref 0.0–0.1)
Basophils Relative: 1 %
Eosinophils Absolute: 0.1 10*3/uL (ref 0.0–0.5)
Eosinophils Relative: 1 %
HCT: 43 % (ref 36.0–46.0)
Hemoglobin: 14.7 g/dL (ref 12.0–15.0)
Immature Granulocytes: 0 %
Lymphocytes Relative: 25 %
Lymphs Abs: 1.1 10*3/uL (ref 0.7–4.0)
MCH: 30.4 pg (ref 26.0–34.0)
MCHC: 34.2 g/dL (ref 30.0–36.0)
MCV: 88.8 fL (ref 80.0–100.0)
Monocytes Absolute: 0.6 10*3/uL (ref 0.1–1.0)
Monocytes Relative: 13 %
Neutro Abs: 2.5 10*3/uL (ref 1.7–7.7)
Neutrophils Relative %: 60 %
Platelets: 141 10*3/uL — ABNORMAL LOW (ref 150–400)
RBC: 4.84 MIL/uL (ref 3.87–5.11)
RDW: 12.1 % (ref 11.5–15.5)
WBC: 4.2 10*3/uL (ref 4.0–10.5)
nRBC: 0 % (ref 0.0–0.2)

## 2019-01-21 MED ORDER — TAMOXIFEN CITRATE 20 MG PO TABS: 20.0000 mg | ORAL_TABLET | Freq: Every day | ORAL | 12 refills | Status: AC

## 2019-01-22 ENCOUNTER — Telehealth: Payer: Self-pay | Admitting: *Deleted

## 2019-01-22 ENCOUNTER — Telehealth: Payer: Self-pay | Admitting: Oncology

## 2019-01-22 NOTE — Telephone Encounter (Signed)
I talk with patient regarding schedule  

## 2019-01-22 NOTE — Telephone Encounter (Signed)
[facility] faxed Prior authorization request for Tamoxifen 20 mg.  Request to [facility] letter tray receptacle of Prior Authorization requests and forms for review.

## 2019-01-26 NOTE — Progress Notes (Signed)
  Patient Name: Joan Lopez MRN: [id number] DOB: 1950/10/10 Referring Physician: Aundria Mems (Profile Not Attached) Date of Service: 12/29/2018 [facility], [place]                                                        End Of Treatment Note  Diagnoses: C50.211-Malignant neoplasm of upper-inner quadrant of right female breast  Cancer Staging: Stage IA (pT1b, pN0), ER+ Maxine Glenn /Her2-, Grade 1  Intent: Curative  Radiation Treatment Dates: 12/01/2018 through 12/29/2018 Site Technique Total Dose Dose per Fx Completed Fx Beam Energies  Breast: Breast_Rt 3D 40.05/40.05 2.67 15/15 6X  Breast: Breast_Rt_Bst specialPort 10/10 2 5/5 9E   Narrative: The patient tolerated radiation therapy relatively well. She denied pain throughout treatment. She reported some fatigue. She experienced some erythema with no desquamation.  Plan: The patient will follow-up with radiation oncology in 1 month.  ________________________________________________   Blair Promise, PhD, MD  This document serves as a record of services personally performed by Gery Pray, MD. It was created on his behalf by Wilburn Mylar, a trained medical scribe. The creation of this record is based on the scribe's personal observations and the provider's statements to them. This document has been checked and approved by the attending provider.

## 2019-01-27 ENCOUNTER — Other Ambulatory Visit: Payer: Self-pay

## 2019-01-27 ENCOUNTER — Encounter: Payer: Self-pay | Admitting: Sports Medicine

## 2019-01-27 ENCOUNTER — Ambulatory Visit (INDEPENDENT_AMBULATORY_CARE_PROVIDER_SITE_OTHER): Payer: Medicare Other | Admitting: Sports Medicine

## 2019-01-27 DIAGNOSIS — M722 Plantar fascial fibromatosis: Secondary | ICD-10-CM

## 2019-01-27 NOTE — Progress Notes (Signed)
Subjective:    CC: Follow-up  HPI: This is a pleasant 68 year old female runner, she has had plantar fasciitis for some time now, it is persisted in spite of unloading, orthotics, scaphoid pads, air heel brace.  Moderate, persistent, localized without radiation  I reviewed the past medical history, family history, social history, surgical history, and allergies today and no changes were needed.  Please see the problem list section below in epic for further details.  Past Medical History: Past Medical History:  Diagnosis Date   Abnormal LFTs 10/29/2012   Acquired bilateral renal cysts    Arthritis    Bilateral bunions    Constipation 10/16/2011   DDD (degenerative disc disease)    spine w narrowing   Dehydration, mild 12/26/2011   Diverticulitis    Exercise counseling 11/14/2011   Family history of brain cancer    Family history of breast cancer    Family history of cancer    Family history of colon cancer    Family history of kidney cancer    Family history of oral cancer    Family history of stomach cancer    Fuch's endothelial dystrophy    Guttate psoriasis    Hematuria 08/24/2011   Hemorrhoids    History of chicken pox    Hyperlipidemia    Kidney stones    Lipoma of arm 07/13/2012   Lipoma of arm 10/07/2012   Left    Mass of arm 07/13/2012   Has had them in past     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   Post-menopausal bleeding 10/08/2011   Reflux 04/17/2012   Renal insufficiency 01/11/2013   Thrombocytopenia ([place]) 09/27/2011   Past Surgical History: Past Surgical History:  Procedure Laterality Date   APPENDECTOMY  1986   BREAST LUMPECTOMY WITH RADIOACTIVE SEED AND SENTINEL LYMPH NODE BIOPSY Right 10/24/2018   Procedure: RIGHT BREAST LUMPECTOMY WITH RADIOACTIVE SEED AND RIGHT AXILLARY SENTINEL LYMPH NODE BIOPSY, RIGHT BLUE DYE INJECTION;  Surgeon: Fanny Skates, MD;  Location: [facility];  Service: General;  Laterality: Right;   [place]     COLONOSCOPY  2005   (records here)[place] - diverticulosis and hemorrhoids   CORNEAL TRANSPLANT  2013   CYSTOSCOPY WITH RETROGRADE PYELOGRAM, URETEROSCOPY AND STENT PLACEMENT Right 10/22/2013   Procedure: CYSTOSCOPY WITH RETROGRADE PYELOGRAM, URETEROSCOPY ;  Surgeon: Raynelle Bring, MD;  Location: [facility];  Service: Urology;  Laterality: Right;   LIPOMA EXCISION  2014   x 3, both arms   TONSILLECTOMY     possible adenoids   TOTAL ABDOMINAL HYSTERECTOMY  1991   for fibroids   TUBAL LIGATION     WRIST FRACTURE SURGERY     right, titanium plate and pins   Social History: Social History   Socioeconomic History   Marital status: Married    Spouse name: Not on file   Number of children: Not on file   Years of education: Not on file   Highest education level: Not on file  Occupational History   Not on file  Social Needs   Financial resource strain: Not on file   Food insecurity    Worry: Not on file    Inability: Not on file   Transportation needs    Medical: Not on file    Non-medical: Not on file  Tobacco Use   Smoking status: Never Smoker   Smokeless tobacco: Never Used  Substance and Sexual Activity   Alcohol  use: No   Drug use: No   Sexual activity: Yes  Lifestyle   Physical activity    Days per week: Not on file    Minutes per session: Not on file   Stress: Not on file  Relationships   Social connections    Talks on phone: Not on file    Gets together: Not on file    Attends religious service: Not on file    Active member of club or organization: Not on file    Attends meetings of clubs or organizations: Not on file    Relationship status: Not on file  Other Topics Concern   Not on file  Social History Narrative   Not on file   Family History: Family History  Problem Relation Age of Onset   Stroke Mother    Heart disease Mother    Hypertension Mother    Kidney disease  Father    Breast cancer Sister 52   Cancer Sister 94       ductal carcinoma breast- had it in 1999 and just found out its in the opposite breast   Brain cancer Maternal Grandmother 64       brain tumor   Breast cancer Paternal Grandmother 32       post menopausal    Pneumonia Maternal Grandfather    Colon cancer Maternal Aunt 21       40s or 64s   Cancer Paternal Uncle 64       neck tumor   Stomach cancer Maternal Aunt 80       80s   Kidney cancer Cousin 30       30s, maternal first cousin   Cancer Paternal Aunt        blood cancer, unknown but older age at diagnosis   Cancer Maternal Aunt 70       unknown, but not breast, 7s   Cancer Cousin        oral cancer diagnosed older, maternal first cousin   Esophageal cancer Neg Hx    Pancreatic cancer Neg Hx    Rectal cancer Neg Hx    Allergies: Allergies  Allergen Reactions   Augmentin [Amoxicillin-Pot Clavulanate]     Severe yeast infection-would prefer not to take.   Medications: See med rec.  Review of Systems: No fevers, chills, night sweats, weight loss, chest pain, or shortness of breath.   Objective:    General: Well Developed, well nourished, and in no acute distress.  Neuro: Alert and oriented x3, extra-ocular muscles intact, sensation grossly intact.  HEENT: Normocephalic, atraumatic, pupils equal round reactive to light, neck supple, no masses, no lymphadenopathy, thyroid nonpalpable.  Skin: Warm and dry, no rashes. Cardiac: Regular rate and rhythm, no murmurs rubs or gallops, no lower extremity edema.  Respiratory: Clear to auscultation bilaterally. Not using accessory muscles, speaking in full sentences. Right foot: No visible erythema or swelling. Range of motion is full in all directions. Strength is 5/5 in all directions. No hallux valgus. No pes cavus or pes planus. No abnormal callus noted. No pain over the navicular prominence, or base of fifth metatarsal. Moderate tenderness to  palpation of the calcaneal insertion of plantar fascia. No pain at the Achilles insertion. No pain over the calcaneal bursa. No pain of the retrocalcaneal bursa. No tenderness to palpation over the tarsals, metatarsals, or phalanges. No hallux rigidus or limitus. No tenderness palpation over interphalangeal joints. No pain with compression of the metatarsal heads. Neurovascularly intact distally.  Procedure: Real-time Ultrasound Guided injection of the right plantar fascial origin Device: GE Logiq E  Verbal informed consent obtained.  Time-out conducted.  Noted no overlying erythema, induration, or other signs of local infection.  Skin prepped in a sterile fashion.  Local anesthesia: Topical Ethyl chloride.  With sterile technique and under real time ultrasound guidance:  1 cc Kenalog 40, 1 cc lidocaine, 1 cc bupivacaine injected easily Completed without difficulty  Pain immediately resolved suggesting accurate placement of the medication.  Advised to call if fevers/chills, erythema, induration, drainage, or persistent bleeding.  Images permanently stored and available for review in the ultrasound unit.  Impression: Technically successful ultrasound guided injection.  Impression and Recommendations:    Plantar fasciitis, right Ebony Hail returns, continues to have pain in her right plantar fascia, she is agreeable to proceed with injection which was done today, return in 1 month.   ___________________________________________ Gwen Her. Dianah Field, M.D., ABFM., CAQSM. Primary Care and Sports Medicine  [facility]  Adjunct Professor of [employer] of [facility] [employer] Medicine

## 2019-01-27 NOTE — Assessment & Plan Note (Signed)
Joan Lopez returns, continues to have pain in her right plantar fascia, she is agreeable to proceed with injection which was done today, return in 1 month.

## 2019-01-29 ENCOUNTER — Ambulatory Visit
Admission: RE | Admit: 2019-01-29 | Discharge: 2019-01-29 | Disposition: A | Discharge status: Home / Self Care | Payer: Medicare Other | Source: Ambulatory Visit | Attending: Radiation Oncology | Admitting: Radiation Oncology

## 2019-01-29 ENCOUNTER — Other Ambulatory Visit: Payer: Self-pay

## 2019-01-29 ENCOUNTER — Encounter: Payer: Self-pay | Admitting: Radiation Oncology

## 2019-01-29 VITALS — BP 134/83 | HR 70 | Temp 98.3°F | Resp 18 | Ht 60.0 in | Wt 118.5 lb

## 2019-01-29 DIAGNOSIS — Z79810 Long term (current) use of selective estrogen receptor modulators (SERMs): Secondary | ICD-10-CM | POA: Diagnosis not present

## 2019-01-29 DIAGNOSIS — C50211 Malignant neoplasm of upper-inner quadrant of right female breast: Secondary | ICD-10-CM | POA: Diagnosis not present

## 2019-01-29 DIAGNOSIS — Z923 Personal history of irradiation: Secondary | ICD-10-CM | POA: Diagnosis not present

## 2019-01-29 DIAGNOSIS — Z170 Estrogen receptor positive status [ER+]: Secondary | ICD-10-CM | POA: Insufficient documentation

## 2019-01-29 NOTE — Patient Instructions (Signed)
Coronavirus (COVID-19) Are you at risk?  Are you at risk for the Coronavirus (COVID-19)?  To be considered HIGH RISK for Coronavirus (COVID-19), you have to meet the following criteria:  . Traveled to [place], [place], [name], [place] or [place]; or in the [place] to [place], [place], [place], or [place]; and have fever, cough, and shortness of breath within the last 2 weeks of travel OR . Been in close contact with a person diagnosed with COVID-19 within the last 2 weeks and have fever, cough, and shortness of breath . IF YOU DO NOT MEET THESE CRITERIA, YOU ARE CONSIDERED LOW RISK FOR COVID-19.  What to do if you are HIGH RISK for COVID-19?  . If you are having a medical emergency, call 911. . Seek medical care right away. Before you go to a doctor's office, urgent care or emergency department, call ahead and tell them about your recent travel, contact with someone diagnosed with COVID-19, and your symptoms. You should receive instructions from your physician's office regarding next steps of care.  . When you arrive at healthcare provider, tell the healthcare staff immediately you have returned from visiting [place], [place], [place], [place] or [name]; or traveled in the [place] to [place], [place], [place], or [place]; in the last two weeks or you have been in close contact with a person diagnosed with COVID-19 in the last 2 weeks.   . Tell the health care staff about your symptoms: fever, cough and shortness of breath. . After you have been seen by a medical provider, you will be either: o Tested for (COVID-19) and discharged home on quarantine except to seek medical care if symptoms worsen, and asked to  - Stay home and avoid contact with others until you get your results (4-5 days)  - Avoid travel on public transportation if possible (such as bus, train, or airplane) or o Sent to the Emergency Department by EMS for evaluation, COVID-19 testing, and possible  admission depending on your condition and test results.  What to do if you are LOW RISK for COVID-19?  Reduce your risk of any infection by using the same precautions used for avoiding the common cold or flu:  . Wash your hands often with soap and warm water for at least 20 seconds.  If soap and water are not readily available, use an alcohol-based hand sanitizer with at least 60% alcohol.  . If coughing or sneezing, cover your mouth and nose by coughing or sneezing into the elbow areas of your shirt or coat, into a tissue or into your sleeve (not your hands). . Avoid shaking hands with others and consider head nods or verbal greetings only. . Avoid touching your eyes, nose, or mouth with unwashed hands.  . Avoid close contact with people who are sick. . Avoid places or events with large numbers of people in one location, like concerts or sporting events. . Carefully consider travel plans you have or are making. . If you are planning any travel outside or inside the [place], visit the CDC's Travelers' Health webpage for the latest health notices. . If you have some symptoms but not all symptoms, continue to monitor at home and seek medical attention if your symptoms worsen. . If you are having a medical emergency, call 911.   ADDITIONAL HEALTHCARE OPTIONS FOR PATIENTS  [facility] / e-Visit: [email or web address]         [facility]: [phone]  [facility]: [phone]                   [facility]: [phone]   

## 2019-01-29 NOTE — Progress Notes (Signed)
Radiation Oncology         [phone] ________________________________  Name: Joan Lopez MRN: [id number]  Date: 01/29/2019  DOB: 1951/01/11  Follow-Up Visit Note  CC: Silverio Decamp, MD  Silverio Decamp,*    ICD-10-CM   1. Malignant neoplasm of upper-inner quadrant of right breast in female, estrogen receptor positive ([place])  C50.211    Z17.0     Diagnosis:   StageIA (pT1b, pN0) RightBreast UIQ Invasive Ductal Carcinoma, ER+/ PR+/ Her2-, Grade1  Interval Since Last Radiation:  1 month   12/01/2018 through 12/29/2018 Site Technique Total Dose Dose per Fx Completed Fx Beam Energies  Breast: Breast_Rt 3D 40.05/40.05 Gy 2.67 15/15 6X  Breast: Breast_Rt_Bst specialPort 10/10 Gy 2 5/5 9E   Narrative:  The patient returns today for routine follow-up. She last saw Dr. Jana Hakim on 01/21/2019 and was started on tamoxifen.  She reports tolerating this medication well without any side effects thus far.  She has been on the medication approximately a week.  She will occasionally have some sharp shooting pains within the breast but no consistent pain.  She denies any itching in the breast area nipple discharge or bleeding.  On review of systems, she is bothered by plantar fasciitis and recently received injection by her primary care physician concerning this issue.  She is disappointed that her upcoming marathon was canceled due to COVID-19 issues  ALLERGIES:  is allergic to augmentin [amoxicillin-pot clavulanate].  Meds: Current Outpatient Medications  Medication Sig Dispense Refill  . calcium carbonate (OSCAL) 1500 (600 Ca) MG TABS tablet Take 600 mg of elemental calcium by mouth daily with breakfast.    . loteprednol (LOTEMAX) 0.5 % ophthalmic suspension Place 1 drop into both eyes as directed. Reported on 06/07/2015    . tamoxifen (NOLVADEX) 20 MG tablet Take 1 tablet (20 mg total) by mouth daily. 90 tablet 12   No current facility-administered medications for this  encounter.     Physical Findings: The patient is in no acute distress. Patient is alert and oriented.  height is 5' (1.524 m) and weight is 118 lb 8 oz (53.8 kg). Her temporal temperature is 98.3 F (36.8 C). Her blood pressure is 134/83 and her pulse is 70. Her respiration is 18 and oxygen saturation is 99%. .  No significant changes. Lungs are clear to auscultation bilaterally. Heart has regular rate and rhythm. No palpable cervical, supraclavicular, or axillary adenopathy. Abdomen soft, non-tender, normal bowel sounds. Left Breast: no palpable mass, nipple discharge or bleeding. Right Breast: Mild hyperpigmentation changes present.  Skin is well-healed.  Mild edema in the nipple areolar complex area.  No dominant mass appreciated in the breast nipple discharge or bleeding.  Lab Findings: Lab Results  Component Value Date   WBC 4.2 01/21/2019   HGB 14.7 01/21/2019   HCT 43.0 01/21/2019   MCV 88.8 01/21/2019   PLT 141 (L) 01/21/2019    Radiographic Findings: No results found.  Impression:  The patient is recovering from the effects of radiation.  No evidence of recurrence on clinical exam today.  She is tolerating adjuvant hormonal therapy well thus far.  Plan: Patient will follow up with Dr. Jana Hakim in January.  She would like to have one more follow-up in radiation oncology and this is been scheduled for April 2021.  ____________________________________ Gery Pray, MD   This document serves as a record of services personally performed by Gery Pray, MD. It was created on his behalf by Wilburn Mylar, a trained  medical scribe. The creation of this record is based on the scribe's personal observations and the provider's statements to them. This document has been checked and approved by the attending provider.

## 2019-01-29 NOTE — Progress Notes (Signed)
Pt presents today for f/u with Dr. Sondra Come. Pt reports fatigue has returned to baseline. Pt reports pain in foot due to plantar fasciitis. Pt reports that redness in breast has peeled and has resolved. Pt reports using moisturizer with vitamin E on breast. Breast with some hyperpigmentation, otherwise breast at baseline.  BP 134/83 (BP Location: Left Arm, Patient Position: Sitting)   Pulse 70   Temp 98.3 F (36.8 C) (Temporal)   Resp 18   Ht 5' (1.524 m)   Wt 118 lb 8 oz (53.8 kg)   SpO2 99%   BMI 23.14 kg/m   Wt Readings from Last 3 Encounters:  01/29/19 118 lb 8 oz (53.8 kg)  01/27/19 116 lb (52.6 kg)  01/21/19 114 lb 9.6 oz (52 kg)   Loma Sousa, RN BSN

## 2019-02-24 ENCOUNTER — Ambulatory Visit: Payer: Medicare Other

## 2019-02-24 ENCOUNTER — Ambulatory Visit (INDEPENDENT_AMBULATORY_CARE_PROVIDER_SITE_OTHER): Payer: Medicare Other

## 2019-02-24 ENCOUNTER — Other Ambulatory Visit: Payer: Self-pay

## 2019-02-24 ENCOUNTER — Ambulatory Visit (INDEPENDENT_AMBULATORY_CARE_PROVIDER_SITE_OTHER): Payer: Medicare Other | Admitting: Sports Medicine

## 2019-02-24 ENCOUNTER — Encounter: Payer: Self-pay | Admitting: Sports Medicine

## 2019-02-24 DIAGNOSIS — M722 Plantar fascial fibromatosis: Secondary | ICD-10-CM | POA: Diagnosis not present

## 2019-02-24 DIAGNOSIS — M816 Localized osteoporosis [Lequesne]: Secondary | ICD-10-CM | POA: Diagnosis not present

## 2019-02-24 DIAGNOSIS — M19071 Primary osteoarthritis, right ankle and foot: Secondary | ICD-10-CM

## 2019-02-24 DIAGNOSIS — Z0000 Encounter for general adult medical examination without abnormal findings: Secondary | ICD-10-CM | POA: Diagnosis not present

## 2019-02-24 NOTE — Progress Notes (Signed)
Subjective:    CC: Follow-up  HPI: Joan Lopez returns, she is a pleasant 68 year old female runner, we have been treating her for months of foot pain, localized over the plantar fascia as well as over the dorsal midfoot.  We have done activity modification, orthotics, injections without any improvement.  I reviewed the past medical history, family history, social history, surgical history, and allergies today and no changes were needed.  Please see the problem list section below in epic for further details.  Past Medical History: Past Medical History:  Diagnosis Date  . Abnormal LFTs 10/29/2012  . Acquired bilateral renal cysts   . Arthritis   . Bilateral bunions   . Constipation 10/16/2011  . DDD (degenerative disc disease)    spine w narrowing  . Dehydration, mild 12/26/2011  . Diverticulitis   . Exercise counseling 11/14/2011  . Family history of brain cancer   . Family history of breast cancer   . Family history of cancer   . Family history of colon cancer   . Family history of kidney cancer   . Family history of oral cancer   . Family history of stomach cancer   . Fuch's endothelial dystrophy   . Guttate psoriasis   . Hematuria 08/24/2011  . Hemorrhoids   . History of chicken pox   . Hyperlipidemia   . Kidney stones   . Lipoma of arm 07/13/2012  . Lipoma of arm 10/07/2012   Left   . Mass of arm 07/13/2012   Has had them in past    . 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 Post-menopausal bleeding 10/08/2011  . Reflux 04/17/2012  . Renal insufficiency 01/11/2013  . Thrombocytopenia ([place]) 09/27/2011   Past Surgical History: Past Surgical History:  Procedure Laterality Date  . APPENDECTOMY  1986  . BREAST LUMPECTOMY WITH RADIOACTIVE SEED AND SENTINEL LYMPH NODE BIOPSY Right 10/24/2018   Procedure: RIGHT BREAST LUMPECTOMY WITH RADIOACTIVE SEED AND RIGHT AXILLARY SENTINEL LYMPH NODE BIOPSY, RIGHT BLUE DYE INJECTION;  Surgeon: Fanny Skates, MD;  Location: [facility];   Service: General;  Laterality: Right;  PEC BLOCK  . CESAREAN SECTION    . CHOLECYSTECTOMY    . COLONOSCOPY  2005   (records here)[place] - diverticulosis and hemorrhoids  . CORNEAL TRANSPLANT  2013  . CYSTOSCOPY WITH RETROGRADE PYELOGRAM, URETEROSCOPY AND STENT PLACEMENT Right 10/22/2013   Procedure: CYSTOSCOPY WITH RETROGRADE PYELOGRAM, URETEROSCOPY ;  Surgeon: Raynelle Bring, MD;  Location: [facility];  Service: Urology;  Laterality: Right;  . LIPOMA EXCISION  2014   x 3, both arms  . TONSILLECTOMY     possible adenoids  . TOTAL ABDOMINAL HYSTERECTOMY  1991   for fibroids  . TUBAL LIGATION    . WRIST FRACTURE SURGERY     right, titanium plate and pins   Social History: Social History   Socioeconomic History  . Marital status: Married    Spouse name: Not on file  . Number of children: Not on file  . Years of education: Not on file  . Highest education level: Not on file  Occupational History  . Not on file  Social Needs  . Financial resource strain: Not on file  . Food insecurity    Worry: Not on file    Inability: Not on file  . Transportation needs    Medical: Not on file    Non-medical: Not on file  Tobacco Use  . Smoking status: Never Smoker  . Smokeless tobacco: Never Used  Substance and Sexual Activity  . Alcohol use: No  . Drug use: No  . Sexual activity: Yes  Lifestyle  . Physical activity    Days per week: Not on file    Minutes per session: Not on file  . Stress: Not on file  Relationships  . Social Herbalist on phone: Not on file    Gets together: Not on file    Attends religious service: Not on file    Active member of club or organization: Not on file    Attends meetings of clubs or organizations: Not on file    Relationship status: Not on file  Other Topics Concern  . Not on file  Social History Narrative  . Not on file   Family History: Family History  Problem Relation Age of Onset  . Stroke Mother   . Heart disease Mother   .  Hypertension Mother   . Kidney disease Father   . Breast cancer Sister 28  . Cancer Sister 56       ductal carcinoma breast- had it in 1999 and just found out its in the opposite breast  . Brain cancer Maternal Grandmother 55       brain tumor  . Breast cancer Paternal Grandmother 43       post menopausal   . Pneumonia Maternal Grandfather   . Colon cancer Maternal Aunt 73       40s or 42s  . Cancer Paternal Uncle 64       neck tumor  . Stomach cancer Maternal Aunt 80       80s  . Kidney cancer Cousin 42       30s, maternal first cousin  . Cancer Paternal Aunt        blood cancer, unknown but older age at diagnosis  . Cancer Maternal Aunt 70       unknown, but not breast, 32s  . Cancer Cousin        oral cancer diagnosed older, maternal first cousin  . Esophageal cancer Neg Hx   . Pancreatic cancer Neg Hx   . Rectal cancer Neg Hx    Allergies: Allergies  Allergen Reactions  . Augmentin [Amoxicillin-Pot Clavulanate]     Severe yeast infection-would prefer not to take.   Medications: See med rec.  Review of Systems: No fevers, chills, night sweats, weight loss, chest pain, or shortness of breath.   Objective:    General: Well Developed, well nourished, and in no acute distress.  Neuro: Alert and oriented x3, extra-ocular muscles intact, sensation grossly intact.  HEENT: Normocephalic, atraumatic, pupils equal round reactive to light, neck supple, no masses, no lymphadenopathy, thyroid nonpalpable.  Skin: Warm and dry, no rashes. Cardiac: Regular rate and rhythm, no murmurs rubs or gallops, no lower extremity edema.  Respiratory: Clear to auscultation bilaterally. Not using accessory muscles, speaking in full sentences. Right foot: No visible erythema or swelling. Range of motion is full in all directions. Strength is 5/5 in all directions. No hallux valgus. No pes cavus or pes planus. No abnormal callus noted. No pain over the navicular prominence, or base of fifth  metatarsal. Moderate tenderness to palpation of the calcaneal insertion of plantar fascia. No pain at the Achilles insertion. No pain over the calcaneal bursa. No pain of the retrocalcaneal bursa. Moderate tenderness over the third through fifth metatarsal shaft proximally No hallux rigidus or limitus. No tenderness palpation over interphalangeal joints. No pain with compression of  the metatarsal heads. Neurovascularly intact distally.  Impression and Recommendations:    Plantar fasciitis, right Persistent pain at the plantar fascial origin in spite of injection at the last visit. Proceed with heel MRI, next steps can be PRP at the plantar fascial origin versus referral for foot ankle surgery though I think she may just opt to live with it. She also has some pain over the dorsum of the fourth and fifth metatarsals and midfoot, so I am going to add a foot MRI as well.  Annual physical exam Adding a bone density test, just finished a month of tamoxifen.   ___________________________________________ Gwen Her. Dianah Field, M.D., ABFM., CAQSM. Primary Care and Sports Medicine [facility] [facility]  Adjunct Professor of [employer] [facility] of Medicine

## 2019-02-24 NOTE — Assessment & Plan Note (Signed)
Adding a bone density test, just finished a month of tamoxifen.

## 2019-02-24 NOTE — Assessment & Plan Note (Signed)
Persistent pain at the plantar fascial origin in spite of injection at the last visit. Proceed with heel MRI, next steps can be PRP at the plantar fascial origin versus referral for foot ankle surgery though I think she may just opt to live with it. She also has some pain over the dorsum of the fourth and fifth metatarsals and midfoot, so I am going to add a foot MRI as well.

## 2019-03-04 ENCOUNTER — Other Ambulatory Visit: Payer: Self-pay

## 2019-03-04 ENCOUNTER — Ambulatory Visit (INDEPENDENT_AMBULATORY_CARE_PROVIDER_SITE_OTHER): Payer: Medicare Other

## 2019-03-04 DIAGNOSIS — M85851 Other specified disorders of bone density and structure, right thigh: Secondary | ICD-10-CM | POA: Diagnosis not present

## 2019-03-04 DIAGNOSIS — Z780 Asymptomatic menopausal state: Secondary | ICD-10-CM | POA: Diagnosis not present

## 2019-03-04 DIAGNOSIS — M85852 Other specified disorders of bone density and structure, left thigh: Secondary | ICD-10-CM | POA: Diagnosis not present

## 2019-03-04 DIAGNOSIS — M810 Age-related osteoporosis without current pathological fracture: Secondary | ICD-10-CM | POA: Diagnosis not present

## 2019-03-04 DIAGNOSIS — M816 Localized osteoporosis [Lequesne]: Secondary | ICD-10-CM

## 2019-03-10 ENCOUNTER — Encounter: Payer: Self-pay | Admitting: Sports Medicine

## 2019-03-10 DIAGNOSIS — M810 Age-related osteoporosis without current pathological fracture: Secondary | ICD-10-CM | POA: Insufficient documentation

## 2019-03-11 ENCOUNTER — Other Ambulatory Visit: Payer: Self-pay

## 2019-03-23 DIAGNOSIS — C50211 Malignant neoplasm of upper-inner quadrant of right female breast: Secondary | ICD-10-CM | POA: Diagnosis not present

## 2019-04-16 ENCOUNTER — Ambulatory Visit: Payer: Medicare Other | Attending: Internal Medicine

## 2019-04-16 DIAGNOSIS — Z20822 Contact with and (suspected) exposure to covid-19: Secondary | ICD-10-CM

## 2019-04-18 LAB — NOVEL CORONAVIRUS, NAA: SARS-CoV-2, NAA: NOT DETECTED

## 2019-05-04 ENCOUNTER — Telehealth: Payer: Self-pay | Admitting: Oncology

## 2019-05-04 NOTE — Telephone Encounter (Signed)
Returned patient's phone call regarding rescheduling 01/25 appointment time, per patient's request appointment time has been rescheduled.

## 2019-05-05 DIAGNOSIS — Z961 Presence of intraocular lens: Secondary | ICD-10-CM | POA: Diagnosis not present

## 2019-05-05 DIAGNOSIS — H18519 Endothelial corneal dystrophy, unspecified eye: Secondary | ICD-10-CM | POA: Diagnosis not present

## 2019-05-10 NOTE — Progress Notes (Signed)
[place]  [phone] [phone]     ID: Joan Lopez DOB: 16-Apr-1951  MR#: [id number]  DQQ#:[id number]  Patient Care Team: Silverio Decamp, MD as PCP - General (Sports Medicine) Sheresa Cullop, Virgie Dad, MD as Consulting Physician (Oncology) Bunnie Pion, MD as Consulting Physician (Ophthalmology) Gery Pray, MD as Consulting Physician (Radiation Oncology) Erroll Luna, MD as Consulting Physician (General Surgery) Chauncey Cruel, MD OTHER MD:  CHIEF COMPLAINT: Estrogen receptor positive breast cancer  CURRENT TREATMENT: Tamoxifen   INTERVAL HISTORY: Bianney returns today for follow up of her estrogen receptor positive breast cancer.  She was started on tamoxifen at her last visit on 01/21/2019.  She has had absolutely no side effects related to this.  Specifically she has had no hot flashes and no vaginal wetness or dryness problems.  Since her last visit, she underwent bone density screening on 03/04/2019. This showed a T-score of -2.5, which is the cutoff for osteoporosis.  She tells me her primary care physician's nurse called her to start alendronate.  She is really very reluctant to take any more medications though she says  She is followed by Dr. Dianah Field for right foot plantar fasciitis.  That is getting better.  She is hoping to be able to get back to running soon   REVIEW OF SYSTEMS: Tynleigh is taking appropriate pandemic precautions.  She tells me her husband had ulnar nerve surgery today.  She has been reassigned to Dr. Brantley Stage since Dr. Dalbert Batman resigned and she is not sure how to make an appointment to see him.  Aside from these issues a detailed review of systems today was stable   HISTORY OF CURRENT ILLNESS: From the original intake note:  Joan Lopez had routine screening mammography on 09/19/2018 showing a possible abnormality in the right breast. She underwent bilateral diagnostic mammography  with tomography and right breast ultrasonography at [facility] on 09/25/2018 showing: breast density category B; suspicious right breast mass in the 1 o'clock position 9 cm from the nipple measuring 6 mm; indeterminate right breast mass in the 12 o'clock position 6 cm from the nipple measuring 9 mm; no suspicious right axillary lymphadenopathy.  Accordingly on 09/26/2018 she proceeded to biopsy of the right breast areas in question. 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 Prognostic indicators significant for: estrogen receptor, 100% positive and progesterone receptor, 10% positive, both with strong staining intensity. Proliferation marker Ki67 at 5%. HER2 equivocal by immunohistochemistry (2+), but negative by fluorescent in situ hybridization with a signals ratio 1.16 and number per cell 1.80.  Biopsy of the right breast 12 o'clock location from the same reports showed fibroadenoma, sclerotic, with no malignancy identified.  The patient's subsequent history is as detailed below.   PAST MEDICAL HISTORY: Past Medical History:  Diagnosis Date  . Abnormal LFTs 10/29/2012  . Acquired bilateral renal cysts   . Arthritis   . Bilateral bunions   . Constipation 10/16/2011  . DDD (degenerative disc disease)    spine w narrowing  . Dehydration, mild 12/26/2011  . Diverticulitis   . Exercise counseling 11/14/2011  . Family history of brain cancer   . Family history of breast cancer   . Family history of cancer   . Family history of colon cancer   . Family history of kidney cancer   . Family history of oral cancer   . Family history  of stomach cancer   . Fuch's endothelial dystrophy   . Guttate psoriasis   . Hematuria 08/24/2011  . Hemorrhoids   . History of chicken pox   . Hyperlipidemia   . Kidney stones   . Lipoma of arm 07/13/2012  . Lipoma of arm 10/07/2012   Left   . Mass of arm  07/13/2012   Has had them in past    . 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 Post-menopausal bleeding 10/08/2011  . Reflux 04/17/2012  . Renal insufficiency 01/11/2013  . Thrombocytopenia ([place]) 09/27/2011    PAST SURGICAL HISTORY: Past Surgical History:  Procedure Laterality Date  . APPENDECTOMY  1986  . BREAST LUMPECTOMY WITH RADIOACTIVE SEED AND SENTINEL LYMPH NODE BIOPSY Right 10/24/2018   Procedure: RIGHT BREAST LUMPECTOMY WITH RADIOACTIVE SEED AND RIGHT AXILLARY SENTINEL LYMPH NODE BIOPSY, RIGHT BLUE DYE INJECTION;  Surgeon: Fanny Skates, MD;  Location: [facility];  Service: General;  Laterality: Right;  PEC BLOCK  . CESAREAN SECTION    . CHOLECYSTECTOMY    . COLONOSCOPY  2005   (records here)[place] - diverticulosis and hemorrhoids  . CORNEAL TRANSPLANT  2013  . CYSTOSCOPY WITH RETROGRADE PYELOGRAM, URETEROSCOPY AND STENT PLACEMENT Right 10/22/2013   Procedure: CYSTOSCOPY WITH RETROGRADE PYELOGRAM, URETEROSCOPY ;  Surgeon: Raynelle Bring, MD;  Location: [facility];  Service: Urology;  Laterality: Right;  . LIPOMA EXCISION  2014   x 3, both arms  . TONSILLECTOMY     possible adenoids  . TOTAL ABDOMINAL HYSTERECTOMY  1991   for fibroids  . TUBAL LIGATION    . WRIST FRACTURE SURGERY     right, titanium plate and pins    FAMILY HISTORY: Family History  Problem Relation Age of Onset  . Stroke Mother   . Heart disease Mother   . Hypertension Mother   . Kidney disease Father   . Breast cancer Sister 3  . Cancer Sister 68       ductal carcinoma breast- had it in 1999 and just found out its in the opposite breast  . Brain cancer Maternal Grandmother 55       brain tumor  . Breast cancer Paternal Grandmother 59       post menopausal   . Pneumonia Maternal Grandfather   . Colon cancer Maternal Aunt 3       40s or 76s  . Cancer Paternal Uncle 64       neck tumor  . Stomach cancer Maternal Aunt 80       80s  . Kidney cancer Cousin 48       30s, maternal first  cousin  . Cancer Paternal Aunt        blood cancer, unknown but older age at diagnosis  . Cancer Maternal Aunt 70       unknown, but not breast, 67s  . Cancer Cousin        oral cancer diagnosed older, maternal first cousin  . Esophageal cancer Neg Hx   . Pancreatic cancer Neg Hx   . Rectal cancer Neg Hx    Patient's father was 51 years old when he died from kidney disease. Patient's mother died from old age at age 15, possibly something related to her colon.  The patient has 1 sister, no brothers.  Patient has a family history of breast cancer in her sister, diagnosed in her 67s with recurrence more than 5 years later, and in her paternal grandmother. Both were diagnosed following menopause. Her  sister underwent bilateral mastectomies. The patient denies a family hx of ovarian, prostate, or pancreatic cancer.  She also notes: colon cancer in a maternal aunt at age 62; stomach cancer in another maternal aunt; a neck tumor in a paternal uncle; a brain tumor in her maternal grandfather.   GYNECOLOGIC HISTORY:  No LMP recorded. Patient has had a hysterectomy. Menarche: 69 years old Age at first live birth: 69 years old [place] P 1 LMP 1991 Contraceptive: IUD, remote HRT estrogen approximately one month Hysterectomy? Yes, 1991 BSO? 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 Husband Stefanie Libel") is a retired Engineer, structural. He has significant health issues.  At home it is just the 2 of them.  Daughter Gay Filler is a Associate Professor at [employer] and has two daughters, Harmon Pier and Chrys Racer (the patient's granddaughters). Chrys Racer is very interested in medicine.  The patient is not a church attender    ADVANCED DIRECTIVES: Currently, husband Barnabas Lister is automaticcaly her HCPOA.    HEALTH MAINTENANCE: Social History   Tobacco Use  . Smoking status: Never Smoker  . Smokeless tobacco: Never Used  Substance Use Topics  . Alcohol use: No  .  Drug use: No     Colonoscopy: 09/2013, Dr. Carlean Purl, repeat 2025  PAP: 09/2011, normal  Bone density: 05/2014, -2.2   Allergies  Allergen Reactions  . Augmentin [Amoxicillin-Pot Clavulanate]     Severe yeast infection-would prefer not to take.    Current Outpatient Medications  Medication Sig Dispense Refill  . calcium carbonate (OSCAL) 1500 (600 Ca) MG TABS tablet Take 1 tablet (1,500 mg total) by mouth 2 (two) times daily with a meal.    . loteprednol (LOTEMAX) 0.5 % ophthalmic suspension Place 1 drop into both eyes as directed. Reported on 06/07/2015    . tamoxifen (NOLVADEX) 20 MG tablet Take 1 tablet (20 mg total) by mouth daily. 90 tablet 4   No current facility-administered medications for this visit.    OBJECTIVE: Middle-aged white woman who appears younger than stated age  80:   05/11/19 1500  BP: (!) 142/74  Pulse: 83  Resp: 18  Temp: 97.8 F (36.6 C)  SpO2: 98%     Body mass index is 24.94 kg/m.   Wt Readings from Last 3 Encounters:  05/11/19 127 lb 11.2 oz (57.9 kg)  02/24/19 118 lb (53.5 kg)  01/29/19 118 lb 8 oz (53.8 kg)      ECOG FS:1 - Symptomatic but completely ambulatory  Sclerae unicteric, EOMs intact Wearing a mask No cervical or supraclavicular adenopathy Lungs no rales or rhonchi Heart regular rate and rhythm Abd soft, nontender, positive bowel sounds MSK no focal spinal tenderness, no upper extremity lymphedema Neuro: nonfocal, well oriented, appropriate affect Breasts: The right breast is status post surgery and radiation.  The cosmetic result is good.  There is the expected thickening of the skin and some darkening of the skin as well.  Left breast is benign.  Both axillae are benign.  LAB RESULTS:  CMP     Component Value Date/Time   NA 143 05/11/2019 1448   K 3.9 05/11/2019 1448   CL 105 05/11/2019 1448   CO2 29 05/11/2019 1448   GLUCOSE 86 05/11/2019 1448   BUN 20 05/11/2019 1448   CREATININE 0.79 05/11/2019 1448   CREATININE  0.76 10/11/2017 1007   CALCIUM 8.8 (L) 05/11/2019 1448   PROT 6.9 05/11/2019 1448   ALBUMIN  3.8 05/11/2019 1448   AST 25 05/11/2019 1448   ALT 14 05/11/2019 1448   ALKPHOS 53 05/11/2019 1448   BILITOT 0.6 05/11/2019 1448   GFRNONAA >60 05/11/2019 1448   GFRNONAA >89 04/02/2011 1037   GFRAA >60 05/11/2019 1448   GFRAA >89 04/02/2011 1037    No results found for: TOTALPROTELP, ALBUMINELP, A1GS, A2GS, BETS, BETA2SER, GAMS, MSPIKE, SPEI  No results found for: KPAFRELGTCHN, LAMBDASER, KAPLAMBRATIO  Lab Results  Component Value Date   WBC 4.7 05/11/2019   NEUTROABS 2.8 05/11/2019   HGB 13.5 05/11/2019   HCT 40.7 05/11/2019   MCV 92.1 05/11/2019   PLT 144 (L) 05/11/2019   No results found for: LABCA2  No components found for: TFTDDU202  No results for input(s): INR in the last 168 hours.  No results found for: LABCA2  No results found for: RKY706  No results found for: CBJ628  No results found for: BTD176  No results found for: CA2729  No components found for: HGQUANT  No results found for: CEA1 / No results found for: CEA1   No results found for: AFPTUMOR  No results found for: CHROMOGRNA  No results found for: PSA1  Appointment on 05/11/2019  Component Date Value Ref Range Status  . WBC 05/11/2019 4.7  4.0 - 10.5 K/uL Final  . RBC 05/11/2019 4.42  3.87 - 5.11 MIL/uL Final  . Hemoglobin 05/11/2019 13.5  12.0 - 15.0 g/dL Final  . HCT 05/11/2019 40.7  36.0 - 46.0 % Final  . MCV 05/11/2019 92.1  80.0 - 100.0 fL Final  . MCH 05/11/2019 30.5  26.0 - 34.0 pg Final  . MCHC 05/11/2019 33.2  30.0 - 36.0 g/dL Final  . RDW 05/11/2019 12.2  11.5 - 15.5 % Final  . Platelets 05/11/2019 144* 150 - 400 K/uL Final  . nRBC 05/11/2019 0.0  0.0 - 0.2 % Final  . Neutrophils Relative % 05/11/2019 60  % Final  . Neutro Abs 05/11/2019 2.8  1.7 - 7.7 K/uL Final  . Lymphocytes Relative 05/11/2019 26  % Final  . Lymphs Abs 05/11/2019 1.2  0.7 - 4.0 K/uL Final  . Monocytes Relative  05/11/2019 12  % Final  . Monocytes Absolute 05/11/2019 0.5  0.1 - 1.0 K/uL Final  . Eosinophils Relative 05/11/2019 1  % Final  . Eosinophils Absolute 05/11/2019 0.0  0.0 - 0.5 K/uL Final  . Basophils Relative 05/11/2019 1  % Final  . Basophils Absolute 05/11/2019 0.0  0.0 - 0.1 K/uL Final  . Immature Granulocytes 05/11/2019 0  % Final  . Abs Immature Granulocytes 05/11/2019 0.02  0.00 - 0.07 K/uL Final   Performed at [facility], [place] [address], [place], [place] [address]  . Sodium 05/11/2019 143  135 - 145 mmol/L Final  . Potassium 05/11/2019 3.9  3.5 - 5.1 mmol/L Final  . Chloride 05/11/2019 105  98 - 111 mmol/L Final  . CO2 05/11/2019 29  22 - 32 mmol/L Final  . Glucose, Bld 05/11/2019 86  70 - 99 mg/dL Final  . BUN 05/11/2019 20  8 - 23 mg/dL Final  . Creatinine, Ser 05/11/2019 0.79  0.44 - 1.00 mg/dL Final  . Calcium 05/11/2019 8.8* 8.9 - 10.3 mg/dL Final  . Total Protein 05/11/2019 6.9  6.5 - 8.1 g/dL Final  . Albumin 05/11/2019 3.8  3.5 - 5.0 g/dL Final  . AST 05/11/2019 25  15 - 41 U/L Final  . ALT 05/11/2019 14  0 -  44 U/L Final  . Alkaline Phosphatase 05/11/2019 53  38 - 126 U/L Final  . Total Bilirubin 05/11/2019 0.6  0.3 - 1.2 mg/dL Final  . GFR calc non Af Amer 05/11/2019 >60  >60 mL/min Final  . GFR calc Af Amer 05/11/2019 >60  >60 mL/min Final  . Anion gap 05/11/2019 9  5 - 15 Final   Performed at [facility], [address] [place],  [address]    (this displays the last labs from the last 3 days)  No results found for: TOTALPROTELP, ALBUMINELP, A1GS, A2GS, BETS, BETA2SER, GAMS, MSPIKE, SPEI (this displays SPEP labs)  No results found for: KPAFRELGTCHN, LAMBDASER, KAPLAMBRATIO (kappa/lambda light chains)  No results found for: HGBA, HGBA2QUANT, HGBFQUANT, HGBSQUAN (Hemoglobinopathy evaluation)   No results found for: LDH  No results found for: IRON, TIBC, IRONPCTSAT (Iron and TIBC)  No results  found for: FERRITIN  Urinalysis    Component Value Date/Time   COLORURINE YELLOW 10/07/2012 [place] 10/07/2012 1331   LABSPEC 1.020 10/07/2012 1331   PHURINE 6.0 10/07/2012 1331   GLUCOSEU NEG 10/07/2012 1331   GLUCOSEU NEG mg/dL 04/02/2007 2006   HGBUR NEG 10/07/2012 1331   HGBUR large 07/25/2009 1427   BILIRUBINUR neg 03/01/2015 1201   KETONESUR NEG 10/07/2012 1331   PROTEINUR neg 03/01/2015 1201   PROTEINUR NEG 10/07/2012 1331   UROBILINOGEN 0.2 03/01/2015 1201   UROBILINOGEN 0.2 10/07/2012 1331   NITRITE neg 03/01/2015 1201   NITRITE NEG 10/07/2012 1331   LEUKOCYTESUR Negative 03/01/2015 1201     STUDIES: No results found.  ELIGIBLE FOR AVAILABLE RESEARCH PROTOCOL: no  ASSESSMENT: 69 y.o. [place], [place] woman status post right breast upper outer quadrant biopsy 09/26/2018 for a clinical T1BN0, stage Ia invasive ductal carcinoma, grade 1, estrogen and progesterone receptor positive, HER-2 negative, with an MIB-1 of 5%  (1) status post right lumpectomy 10/24/2018 for a pT1b pN0, stage IA invasive ductal carcinoma, grade 1, with negative margins  (a) a total of 3 right axillary sentinel lymph nodes removed  (2) no chemotherapy planned, no Oncotype needed  (3) adjuvant radiation 12/01/2018 - 12/29/2018  (a) Right Breast / 40.05 Gy in 15 fractions  (b) Right Breast Boost / 10 Gy in 5 fractions  (4) tamoxifen started October 2020  (a) bone density 05/19/2014 showed a T score of -2.2  (b) status post remote hysterectomy  (c) repeat bone density 03/05/2019 showed a T score of -2.5  (5) genetics testing 11/06/2018 through the [facility] Multi-Cancer panel.found no deleterious mutations in AIP, ALK, APC, ATM, AXIN2,BAP1,  BARD1, BLM, BMPR1A, BRCA1, BRCA2, BRIP1, CASR, CDC73, CDH1, CDK4, CDKN1B, CDKN1C, CDKN2A (p14ARF), CDKN2A (p16INK4a), CEBPA, CHEK2, CTNNA1, DICER1, DIS3L2, EGFR (c.2369C>T, p.Thr790Met variant only), EPCAM (Deletion/duplication testing only), FH,  FLCN, GATA2, GPC3, GREM1 (Promoter region deletion/duplication testing only), HOXB13 (c.251G>A, p.Gly84Glu), HRAS, KIT, MAX, MEN1, MET, MITF (c.952G>A, p.Glu318Lys variant only), MLH1, MSH2, MSH3, MSH6, MUTYH, NBN, NF1, NF2, NTHL1, PALB2, PDGFRA, PHOX2B, PMS2, POLD1, POLE, POT1, PRKAR1A, PTCH1, PTEN, RAD50, RAD51C, RAD51D, RB1, RECQL4, RET, RNF43, RUNX1, SDHAF2, SDHA (sequence changes only), SDHB, SDHC, SDHD, SMAD4, SMARCA4, SMARCB1, SMARCE1, STK11, SUFU, TERC, TERT, TMEM127, TP53, TSC1, TSC2, VHL, WRN and WT1.      PLAN: Joan Lopez is half year out from definitive surgery for breast cancer.  She is 3 months into tamoxifen.  She is tolerating this remarkably well.  She is at the borderline mark for osteoporosis.  We discussed all that in detail today.  She has a good understanding of the way the bisphosphonates work and also the many options both oral and intravenous and that group.  We also discussed denosumab/Prolia.  He understands tamoxifen does help with bone density issues.  At this point she really does not want to do any pharmacologic intervention.  She is going to increase her calcium and vitamin D from daily to twice a day and she understands the importance of walking standing jumping dancing and running.  She will have a repeat bone density with her mammography next year.  Normally she would see our practitioner for survivorship visit in 6 months but she will be seeing Dr. Brantley Stage around that time so we will omit that.  She is taking appropriate pandemic precautions.  She will see me again in 1 year.  She knows to call for any other issues that may develop before then  Total encounter time 30 minutes.Chauncey Cruel, MD   05/11/2019 3:50 PM Medical Oncology and Hematology [facility] [place],  [address] Tel. [phone]    Fax. [phone]   This document serves as a record of services personally performed by Lurline Del, MD. It was  created on his behalf by Wilburn Mylar, a trained medical scribe. The creation of this record is based on the scribe's personal observations and the provider's statements to them.   I, Lurline Del MD, have reviewed the above documentation for accuracy and completeness, and I agree with the above.   *Total Encounter Time as defined by the Centers for Medicare and Medicaid Services includes, in addition to the face-to-face time of a patient visit (documented in the note above) non-face-to-face time: obtaining and reviewing outside history, ordering and reviewing medications, tests or procedures, care coordination (communications with other health care professionals or caregivers) and documentation in the medical record.

## 2019-05-11 ENCOUNTER — Ambulatory Visit: Payer: Medicare Other | Admitting: Oncology

## 2019-05-11 ENCOUNTER — Other Ambulatory Visit: Payer: Self-pay

## 2019-05-11 ENCOUNTER — Inpatient Hospital Stay: Payer: Medicare Other | Attending: Oncology

## 2019-05-11 ENCOUNTER — Inpatient Hospital Stay (HOSPITAL_BASED_OUTPATIENT_CLINIC_OR_DEPARTMENT_OTHER): Payer: Medicare Other | Admitting: Oncology

## 2019-05-11 ENCOUNTER — Other Ambulatory Visit: Payer: Medicare Other

## 2019-05-11 VITALS — BP 142/74 | HR 83 | Temp 97.8°F | Resp 18 | Ht 60.0 in | Wt 127.7 lb

## 2019-05-11 DIAGNOSIS — M818 Other osteoporosis without current pathological fracture: Secondary | ICD-10-CM

## 2019-05-11 DIAGNOSIS — Z79810 Long term (current) use of selective estrogen receptor modulators (SERMs): Secondary | ICD-10-CM | POA: Insufficient documentation

## 2019-05-11 DIAGNOSIS — G500 Trigeminal neuralgia: Secondary | ICD-10-CM

## 2019-05-11 DIAGNOSIS — M8580 Other specified disorders of bone density and structure, unspecified site: Secondary | ICD-10-CM | POA: Insufficient documentation

## 2019-05-11 DIAGNOSIS — D696 Thrombocytopenia, unspecified: Secondary | ICD-10-CM

## 2019-05-11 DIAGNOSIS — M722 Plantar fascial fibromatosis: Secondary | ICD-10-CM

## 2019-05-11 DIAGNOSIS — Z170 Estrogen receptor positive status [ER+]: Secondary | ICD-10-CM | POA: Insufficient documentation

## 2019-05-11 DIAGNOSIS — C50811 Malignant neoplasm of overlapping sites of right female breast: Secondary | ICD-10-CM | POA: Insufficient documentation

## 2019-05-11 DIAGNOSIS — C50211 Malignant neoplasm of upper-inner quadrant of right female breast: Secondary | ICD-10-CM

## 2019-05-11 LAB — CBC WITH DIFFERENTIAL/PLATELET
Abs Immature Granulocytes: 0.02 10*3/uL (ref 0.00–0.07)
Basophils Absolute: 0 10*3/uL (ref 0.0–0.1)
Basophils Relative: 1 %
Eosinophils Absolute: 0 10*3/uL (ref 0.0–0.5)
Eosinophils Relative: 1 %
HCT: 40.7 % (ref 36.0–46.0)
Hemoglobin: 13.5 g/dL (ref 12.0–15.0)
Immature Granulocytes: 0 %
Lymphocytes Relative: 26 %
Lymphs Abs: 1.2 10*3/uL (ref 0.7–4.0)
MCH: 30.5 pg (ref 26.0–34.0)
MCHC: 33.2 g/dL (ref 30.0–36.0)
MCV: 92.1 fL (ref 80.0–100.0)
Monocytes Absolute: 0.5 10*3/uL (ref 0.1–1.0)
Monocytes Relative: 12 %
Neutro Abs: 2.8 10*3/uL (ref 1.7–7.7)
Neutrophils Relative %: 60 %
Platelets: 144 10*3/uL — ABNORMAL LOW (ref 150–400)
RBC: 4.42 MIL/uL (ref 3.87–5.11)
RDW: 12.2 % (ref 11.5–15.5)
WBC: 4.7 10*3/uL (ref 4.0–10.5)
nRBC: 0 % (ref 0.0–0.2)

## 2019-05-11 LAB — COMPREHENSIVE METABOLIC PANEL
ALT: 14 U/L (ref 0–44)
AST: 25 U/L (ref 15–41)
Albumin: 3.8 g/dL (ref 3.5–5.0)
Alkaline Phosphatase: 53 U/L (ref 38–126)
Anion gap: 9 (ref 5–15)
BUN: 20 mg/dL (ref 8–23)
CO2: 29 mmol/L (ref 22–32)
Calcium: 8.8 mg/dL — ABNORMAL LOW (ref 8.9–10.3)
Chloride: 105 mmol/L (ref 98–111)
Creatinine, Ser: 0.79 mg/dL (ref 0.44–1.00)
GFR calc Af Amer: >60 mL/min (ref >60)
GFR calc non Af Amer: >60 mL/min (ref >60)
Glucose, Bld: 86 mg/dL (ref 70–99)
Potassium: 3.9 mmol/L (ref 3.5–5.1)
Sodium: 143 mmol/L (ref 135–145)
Total Bilirubin: 0.6 mg/dL (ref 0.3–1.2)
Total Protein: 6.9 g/dL (ref 6.5–8.1)

## 2019-05-11 MED ORDER — TAMOXIFEN CITRATE 20 MG PO TABS: 20.0000 mg | ORAL_TABLET | Freq: Every day | ORAL | 4 refills | Status: DC

## 2019-05-12 ENCOUNTER — Telehealth: Payer: Self-pay | Admitting: Oncology

## 2019-05-12 NOTE — Telephone Encounter (Signed)
Scheduled per 1/25 los. Called and spoke with pt, confirmed 1/26 appt

## 2019-05-13 ENCOUNTER — Ambulatory Visit (INDEPENDENT_AMBULATORY_CARE_PROVIDER_SITE_OTHER): Payer: Medicare Other | Admitting: Sports Medicine

## 2019-05-13 ENCOUNTER — Ambulatory Visit (INDEPENDENT_AMBULATORY_CARE_PROVIDER_SITE_OTHER): Payer: Medicare Other

## 2019-05-13 ENCOUNTER — Other Ambulatory Visit: Payer: Self-pay

## 2019-05-13 DIAGNOSIS — M5412 Radiculopathy, cervical region: Secondary | ICD-10-CM

## 2019-05-13 DIAGNOSIS — M542 Cervicalgia: Secondary | ICD-10-CM | POA: Diagnosis not present

## 2019-05-13 DIAGNOSIS — M818 Other osteoporosis without current pathological fracture: Secondary | ICD-10-CM

## 2019-05-13 DIAGNOSIS — M47812 Spondylosis without myelopathy or radiculopathy, cervical region: Secondary | ICD-10-CM | POA: Diagnosis not present

## 2019-05-13 MED ORDER — MELOXICAM 15 MG PO TABS: ORAL_TABLET | ORAL | 3 refills | Status: DC

## 2019-05-13 MED ORDER — GABAPENTIN 300 MG PO CAPS: ORAL_CAPSULE | ORAL | 3 refills | Status: DC

## 2019-05-13 NOTE — Assessment & Plan Note (Signed)
Joan Lopez is having increasing pain in her neck, left periscapular region radiating down the left arm but not past the elbow. She does have an itching sensation in her hand. This likely represents cervical radiculitis, we are going to start conservatively, x-rays, meloxicam, gabapentin, formal PT. Because she does have a history of breast cancer we are also going to proceed with an MRI. Return to see me in 6 weeks.

## 2019-05-13 NOTE — Progress Notes (Signed)
    Procedures performed today:    None.  Independent interpretation of tests performed by another provider:   None.  Impression and Recommendations:    Radiculitis of left cervical region Joan Lopez is having increasing pain in her neck, left periscapular region radiating down the left arm but not past the elbow. She does have an itching sensation in her hand. This likely represents cervical radiculitis, we are going to start conservatively, x-rays, meloxicam, gabapentin, formal PT. Because she does have a history of breast cancer we are also going to proceed with an MRI. Return to see me in 6 weeks.  Osteoporosis Patient prefers to avoid osteoporosis treatment for now, risks, benefits explained, she will continue calcium and vitamin D supplementation. She may get some benefit from tamoxifen. We can recheck bone density test in the middle of 2022.    ___________________________________________ Gwen Her. Dianah Field, M.D., ABFM., CAQSM. Primary Care and [place] Instructor of [employer] [facility] of Medicine

## 2019-05-13 NOTE — Assessment & Plan Note (Signed)
Patient prefers to avoid osteoporosis treatment for now, risks, benefits explained, she will continue calcium and vitamin D supplementation. She may get some benefit from tamoxifen. We can recheck bone density test in the middle of 2022.

## 2019-05-16 ENCOUNTER — Ambulatory Visit (INDEPENDENT_AMBULATORY_CARE_PROVIDER_SITE_OTHER): Payer: Medicare Other

## 2019-05-16 ENCOUNTER — Other Ambulatory Visit: Payer: Self-pay

## 2019-05-16 DIAGNOSIS — M5412 Radiculopathy, cervical region: Secondary | ICD-10-CM

## 2019-05-16 DIAGNOSIS — M542 Cervicalgia: Secondary | ICD-10-CM

## 2019-05-20 ENCOUNTER — Telehealth: Payer: Self-pay | Admitting: *Deleted

## 2019-05-20 ENCOUNTER — Ambulatory Visit (INDEPENDENT_AMBULATORY_CARE_PROVIDER_SITE_OTHER): Payer: Medicare Other | Admitting: Physical Therapy

## 2019-05-20 ENCOUNTER — Encounter: Payer: Self-pay | Admitting: Physical Therapy

## 2019-05-20 ENCOUNTER — Other Ambulatory Visit: Payer: Self-pay

## 2019-05-20 DIAGNOSIS — M5412 Radiculopathy, cervical region: Secondary | ICD-10-CM

## 2019-05-20 NOTE — Telephone Encounter (Signed)
This RN returned VM to [place] at the [facility] per her call wanting to verify when next mammogram is to be done.  Order states 07/30/2019 - pt's last Mammogram was June of 2020.  Per review - MD wants new baseline mammo to be in April of 2021- pt was diagnosed per mammo from June 2020 - has now had surgery.  This obtained identified secure VM- message left per above.

## 2019-05-20 NOTE — Patient Instructions (Signed)
Access Code: [email or web address]  URL: [email or web address]  Date: 05/20/2019  Prepared by: Almyra Free Kalina Morabito   Exercises Seated Upper Trapezius Stretch - 3 reps - 1 sets - 30-60 sec hold - 2x daily - 7x weekly Seated Cervical Retraction - 10 reps - 1-3 sets - 5 sec hold - 1x daily - 7x weekly Standing Scapular Retraction - 10 reps - 3 sets - 1x daily - 7x weekly Patient Education Forward Head Posture

## 2019-05-20 NOTE — Therapy (Signed)
[facility], [place], [address] Phone: [phone]   Fax:  [phone]  Physical Therapy Evaluation  Patient Details  Name: Joan Lopez MRN: [id number] Date of Birth: February 23, 1951 Referring Provider (PT): Aundria Mems   Encounter Date: 05/20/2019  PT End of Session - 05/20/19 1020    Visit Number  1    Number of Visits  12    Date for PT Re-Evaluation  07/01/19    PT Start Time  1020    PT Stop Time  1103    PT Time Calculation (min)  43 min    Activity Tolerance  Patient tolerated treatment well    Behavior During Therapy  [facility] for tasks assessed/performed       Past Medical History:  Diagnosis Date  . Abnormal LFTs 10/29/2012  . Acquired bilateral renal cysts   . Arthritis   . Bilateral bunions   . Constipation 10/16/2011  . DDD (degenerative disc disease)    spine w narrowing  . Dehydration, mild 12/26/2011  . Diverticulitis   . Exercise counseling 11/14/2011  . Family history of brain cancer   . Family history of breast cancer   . Family history of cancer   . Family history of colon cancer   . Family history of kidney cancer   . Family history of oral cancer   . Family history of stomach cancer   . Fuch's endothelial dystrophy   . Guttate psoriasis   . Hematuria 08/24/2011  . Hemorrhoids   . History of chicken pox   . Hyperlipidemia   . Kidney stones   . Lipoma of arm 07/13/2012  . Lipoma of arm 10/07/2012   Left   . Mass of arm 07/13/2012   Has had them in past    . 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 Post-menopausal bleeding 10/08/2011  . Reflux 04/17/2012  . Renal insufficiency 01/11/2013  . Thrombocytopenia ([place]) 09/27/2011    Past Surgical History:  Procedure Laterality Date  . APPENDECTOMY  1986  . BREAST LUMPECTOMY WITH RADIOACTIVE SEED AND SENTINEL LYMPH NODE BIOPSY Right 10/24/2018   Procedure: RIGHT BREAST LUMPECTOMY WITH RADIOACTIVE SEED AND RIGHT AXILLARY  SENTINEL LYMPH NODE BIOPSY, RIGHT BLUE DYE INJECTION;  Surgeon: Fanny Skates, MD;  Location: [facility];  Service: General;  Laterality: Right;  PEC BLOCK  . CESAREAN SECTION    . CHOLECYSTECTOMY    . COLONOSCOPY  2005   (records here)[place] - diverticulosis and hemorrhoids  . CORNEAL TRANSPLANT  2013  . CYSTOSCOPY WITH RETROGRADE PYELOGRAM, URETEROSCOPY AND STENT PLACEMENT Right 10/22/2013   Procedure: CYSTOSCOPY WITH RETROGRADE PYELOGRAM, URETEROSCOPY ;  Surgeon: Raynelle Bring, MD;  Location: [facility];  Service: Urology;  Laterality: Right;  . LIPOMA EXCISION  2014   x 3, both arms  . TONSILLECTOMY     possible adenoids  . TOTAL ABDOMINAL HYSTERECTOMY  1991   for fibroids  . TUBAL LIGATION    . WRIST FRACTURE SURGERY     right, titanium plate and pins    There were no vitals filed for this visit.   Subjective Assessment - 05/20/19 1023    Subjective  Starting in July 2020  she began having symptoms in the left UE. She calls it an unpleasant sensation, not pain. Left arm post then needles down the arm to just past elbow. this is intermittent. She also has intermittent in lateral scapular region.  Patient has been on gabapentin for  a week and she started with 2/day today. Takes violin lessons and palm would become itchy with clapping.    Pertinent History  OP, OA, cervical spondylosis, Breast cancer lumpectomy right July 2020 had radiation, Rt wrist fx    Diagnostic tests  MRI - chronic spondylosis possibly affecting left C5 and/or C7 nerve root    Patient Stated Goals  to get rid of tingling    Currently in Pain?  Yes    Pain Score  1     Pain Location  Arm    Pain Orientation  Left    Pain Descriptors / Indicators  Pins and needles;Tingling    Pain Type  Chronic pain    Pain Radiating Towards  left lateral scapula soreness    Pain Onset  More than a month ago    Pain Frequency  Constant    Aggravating Factors   unsure    Effect of Pain on Daily Activities   getting into position for sleeping         [facility] PT Assessment - 05/20/19 0001      Assessment   Medical Diagnosis  radiculitis left cervical region    Referring Provider (PT)  Aundria Mems    Onset Date/Surgical Date  10/15/18    Hand Dominance  Right    Next MD Visit  6 weeks    Prior Therapy  no      Precautions   Precautions  Other (comment)    Precaution Comments  Recent Breast Cancer treatment      Restrictions   Weight Bearing Restrictions  No      Balance Screen   Has the patient fallen in the past 6 months  No    Has the patient had a decrease in activity level because of a fear of falling?   No    Is the patient reluctant to leave their home because of a fear of falling?   No      Prior Function   Level of Independence  Independent    Vocation  Retired    Leisure  long distance runner; Scientific laboratory technician   Posture/Postural Control  Postural limitations    Postural Limitations  Rounded Shoulders;Forward head;Flexed trunk      ROM / Strength   AROM / PROM / Strength  AROM;Strength      AROM   Overall AROM Comments  Functional Cervical ROM and left shoulder     AROM Assessment Site  Cervical    Cervical - Right Rotation  60    Cervical - Left Rotation  63      Strength   Overall Strength Comments  left shoulder 5/5 except ER 4+/5; Lt wrist 5/5, elbow flex 5/5; ext 4+/5    Strength Assessment Site  Cervical    Cervical Flexion  5/5    Cervical Extension  4/5    Cervical - Right Side Bend  5/5    Cervical - Left Side Bend  4/5      Palpation   Spinal mobility  WNL, no pain noted    Palpation comment  left distal scapula medially (lower rhomboids), marked tightness in bil UT; pectorals WNL      Special Tests    Special Tests  Cervical    Cervical Tests  Spurling's      Spurling's   Findings  Positive    Side  Left    Comment  neg on right  Objective measurements completed on examination:  See above findings.              PT Education - 05/20/19 1547    Education Details  HEP; Posture ed          PT Long Term Goals - 05/20/19 1604      PT LONG TERM GOAL #1   Title  Ind with HEP    Time  6    Period  Weeks    Status  New    Target Date  07/01/19      PT LONG TERM GOAL #2   Title  Patient to report decreased incidence of tingling in left UE by 90% or more.    Time  6    Period  Weeks    Status  New      PT LONG TERM GOAL #3   Title  Patient able to get to sleep without difficulty from LUE    Time  6    Period  Weeks    Status  New      PT LONG TERM GOAL #4   Title  Patient to demo in clinic without cueing  and verbalize importance of good posture in preventing UE sx.    Time  6    Period  Weeks    Status  New             Plan - 05/20/19 1547    Clinical Impression Statement  Patient presents with c/o left UE tingling beginning in July 2020. This began after her Rt lumpectomy surgery. Her MRI shows chronic spondylosis in the neck and she was recently diagnosed with osteoporosis. Tingling is intermittent and goes as far as just past the elbow. She also reports an intermittent soreness in the left latissimus region. Patient demos full functional neck and shoulder ROM and has 5/5 strength in her LUE except triceps and ER 4+/5. She has some weakness in neck extensin and left SB. She does demo poor posture in sitting and standing with forward head and rounded shoulders. In sitting she also has weak core engagment resulting in a flexed trunk. Patient is a long distance runner, but has not run recently. The tingling interfere's with her ability to find a comfortable sleeping position and get to sleep. She will benefit from PT to improve her core and upper back/neck strength to normalize posture and return her to her PLOF.    Personal Factors and Comorbidities  Comorbidity 3+    Comorbidities  OP, OA, cervical spondylosis, Breast cancer lumpectomy right  July 2020 had radiation, Rt wrist fx    Stability/Clinical Decision Making  Stable/Uncomplicated    Clinical Decision Making  Low    Rehab Potential  Excellent    PT Frequency  2x / week    PT Duration  6 weeks    PT Treatment/Interventions  ADLs/Self Care Home Management;Cryotherapy;Therapeutic exercise;Neuromuscular re-education;Patient/family education;Manual techniques;Dry needling;Taping    PT Next Visit Plan  postural and cervical strengthening    PT Home Exercise Plan  [id number]    Consulted and Agree with Plan of Care  Patient       Patient will benefit from skilled therapeutic intervention in order to improve the following deficits and impairments:  Pain, Postural dysfunction, Decreased strength  Visit Diagnosis: Radiculopathy, cervical region - Plan: PT plan of care cert/re-cert     Problem List Patient Active Problem List   Diagnosis Date Noted  . Osteoporosis 03/10/2019  . Genetic  testing 11/06/2018  . Tick bite 11/03/2018  . Family history of breast cancer   . Family history of colon cancer   . Family history of stomach cancer   . Family history of brain cancer   . Family history of kidney cancer   . Family history of oral cancer   . Family history of cancer   . Malignant neoplasm of upper-inner quadrant of right breast in female, estrogen receptor positive ([place]) 09/29/2018  . Plantar fasciitis, right 08/15/2018  . Left leg pain 11/07/2015  . Myalgia 11/07/2015  . Exercise counseling 03/01/2015  . Dysfunction of right rotator cuff 12/14/2014  . Skin tag 12/14/2014  . Tongue lesion 06/14/2014  . Primary osteoarthritis of both knees 05/17/2014  . Annual physical exam 08/20/2013  . 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 Reflux 04/17/2012  . Post-menopausal bleeding 10/08/2011  . Thrombocytopenia ([place]) 09/27/2011  . Hematuria 08/24/2011  . Trigeminal neuralgia 02/06/2011  . 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  . Radiculitis of left cervical region  04/06/2009  . Generalized anxiety disorder 05/31/2008    Madelyn Flavors PT 05/20/2019, 4:17 PM  [facility] [place], [place], [address] Phone: [phone]   Fax:  [phone]  Name: Joan Lopez MRN: [id number] Date of Birth: 03/20/1951

## 2019-05-25 ENCOUNTER — Ambulatory Visit (INDEPENDENT_AMBULATORY_CARE_PROVIDER_SITE_OTHER): Payer: Medicare Other | Admitting: Physical Therapy

## 2019-05-25 ENCOUNTER — Other Ambulatory Visit: Payer: Self-pay

## 2019-05-25 DIAGNOSIS — M5412 Radiculopathy, cervical region: Secondary | ICD-10-CM

## 2019-05-25 NOTE — Therapy (Signed)
[facility] [place], [place], [address] Phone: [phone]   Fax:  [phone]  Physical Therapy Treatment  Patient Details  Name: Joan Lopez MRN: [id number] Date of Birth: May 01, 1950 Referring Provider (PT): Aundria Mems   Encounter Date: 05/25/2019  PT End of Session - 05/25/19 1256    Visit Number  2    Number of Visits  12    Date for PT Re-Evaluation  07/01/19    PT Start Time  1152    PT Stop Time  1240    PT Time Calculation (min)  48 min    Activity Tolerance  Patient tolerated treatment well    Behavior During Therapy  [facility] for tasks assessed/performed       Past Medical History:  Diagnosis Date  . Abnormal LFTs 10/29/2012  . Acquired bilateral renal cysts   . Arthritis   . Bilateral bunions   . Constipation 10/16/2011  . DDD (degenerative disc disease)    spine w narrowing  . Dehydration, mild 12/26/2011  . Diverticulitis   . Exercise counseling 11/14/2011  . Family history of brain cancer   . Family history of breast cancer   . Family history of cancer   . Family history of colon cancer   . Family history of kidney cancer   . Family history of oral cancer   . Family history of stomach cancer   . Fuch's endothelial dystrophy   . Guttate psoriasis   . Hematuria 08/24/2011  . Hemorrhoids   . History of chicken pox   . Hyperlipidemia   . Kidney stones   . Lipoma of arm 07/13/2012  . Lipoma of arm 10/07/2012   Left   . Mass of arm 07/13/2012   Has had them in past    . 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 Post-menopausal bleeding 10/08/2011  . Reflux 04/17/2012  . Renal insufficiency 01/11/2013  . Thrombocytopenia ([place]) 09/27/2011    Past Surgical History:  Procedure Laterality Date  . APPENDECTOMY  1986  . BREAST LUMPECTOMY WITH RADIOACTIVE SEED AND SENTINEL LYMPH NODE BIOPSY Right 10/24/2018   Procedure: RIGHT BREAST LUMPECTOMY WITH RADIOACTIVE SEED AND RIGHT AXILLARY SENTINEL  LYMPH NODE BIOPSY, RIGHT BLUE DYE INJECTION;  Surgeon: Fanny Skates, MD;  Location: [facility];  Service: General;  Laterality: Right;  PEC BLOCK  . CESAREAN SECTION    . CHOLECYSTECTOMY    . COLONOSCOPY  2005   (records here)[place] - diverticulosis and hemorrhoids  . CORNEAL TRANSPLANT  2013  . CYSTOSCOPY WITH RETROGRADE PYELOGRAM, URETEROSCOPY AND STENT PLACEMENT Right 10/22/2013   Procedure: CYSTOSCOPY WITH RETROGRADE PYELOGRAM, URETEROSCOPY ;  Surgeon: Raynelle Bring, MD;  Location[facility];  Service: Urology;  Laterality: Right;  . LIPOMA EXCISION  2014   x 3, both arms  . TONSILLECTOMY     possible adenoids  . TOTAL ABDOMINAL HYSTERECTOMY  1991   for fibroids  . TUBAL LIGATION    . WRIST FRACTURE SURGERY     right, titanium plate and pins    There were no vitals filed for this visit.  Subjective Assessment - 05/25/19 1156    Subjective  She does not have pain in neck with holding violin.  She does have increased LUE symptoms after completing seated UT stretch.  Since starting gabapentin and meloxicam, she is able to sleep better.    Pertinent History  OP, OA, cervical spondylosis, Breast cancer lumpectomy right July 2020 had radiation,  Rt wrist fx    Diagnostic tests  MRI - chronic spondylosis possibly affecting left C5 and/or C7 nerve root    Patient Stated Goals  to get rid of tingling    Currently in Pain?  Yes    Pain Score  1     Pain Location  Arm    Pain Orientation  Left    Pain Onset  More than a month ago         [facility] PT Assessment - 05/25/19 0001      Assessment   Medical Diagnosis  radiculitis left cervical region    Referring Provider (PT)  Aundria Mems    Onset Date/Surgical Date  10/15/18    Hand Dominance  Right    Next MD Visit  6 weeks    Prior Therapy  no       [facility] Adult PT Treatment/Exercise - 05/25/19 0001      Self-Care   Self-Care  Posture    Posture  pt educated on importance of posture and effects on neck; pt  encouraged to be mindful of forward head posture with reading and setting a timer to remind her to reset her posture.   Pt shown pictures of musculature of neck and brachial plexus; explaining relation of muscles and how posture affects brachial plexus.  Frequent cues given throughout session for upright posture.       Exercises   Exercises  Neck;Shoulder      Neck Exercises: Seated   Neck Retraction  5 reps;3 secs      Neck Exercises: Supine   Other Supine Exercise  Lt shoulder nerve glides with LUE off edge of table x 10 reps       Shoulder Exercises: Seated   Other Seated Exercises  scap retraction, x 5 sec hold x 5 reps       Shoulder Exercises: Stretch   Other Shoulder Stretches  midlevel doorway stretch x 15 sec x 4 reps      Manual Therapy   Manual Therapy  Myofascial release;Soft tissue mobilization    Manual therapy comments  skilled palpation and monitoring of soft tissue during DN      Soft tissue mobilization  STM to Lt upper trap, levator, cervical paraspinals, rhomboid, scalenes.     Myofascial Release  Lt platysma; bilat suboccipital release      Neck Exercises: Stretches   Upper Trapezius Stretch  Right;3 reps;20 seconds    Upper Trapezius Stretch Limitations  with and without first rib stablization - increased radicular symptoms with head returning to neutral.       Trigger Point Dry Needling - 05/25/19 0001    Consent Given?  Yes    Education Handout Provided  Yes    Muscles Treated Head and Neck  Upper trapezius;Levator scapulae;Cervical multifidi    Muscles Treated Upper Quadrant  Pectoralis major    Upper Trapezius Response  Twitch reponse elicited;Palpable increased muscle length    Levator Scapulae Response  Twitch response elicited;Palpable increased muscle length    Cervical multifidi Response  Twitch reponse elicited;Palpable increased muscle length    Pectoralis Major Response  --   no TP just tightness          PT Education - 05/25/19 1238     Education Details  DN info , updated HEP   Person(s) Educated  Patient    Methods  Explanation;Handout    Comprehension  Verbalized understanding  PT Long Term Goals - 05/20/19 1604      PT LONG TERM GOAL #1   Title  Ind with HEP    Time  6    Period  Weeks    Status  New    Target Date  07/01/19      PT LONG TERM GOAL #2   Title  Patient to report decreased incidence of tingling in left UE by 90% or more.    Time  6    Period  Weeks    Status  New      PT LONG TERM GOAL #3   Title  Patient able to get to sleep without difficulty from LUE    Time  6    Period  Weeks    Status  New      PT LONG TERM GOAL #4   Title  Patient to demo in clinic without cueing  and verbalize importance of good posture in preventing UE sx.    Time  6    Period  Weeks    Status  New            Plan - 05/25/19 1240    Clinical Impression Statement  Palpable tightness in Lt tricep, pec, scalenes, and levator.  Pt reported increased tingles in Lt hand with attempts to do STM to Lt upper trap, suboccipitals, and MFR to Lt platysma.  Pt had positive twitch response to Lt upper trap with DN, per supervising PT, Madelyn Flavors. Pt unable to tolerate upper trap stretch due to increased radicular symptoms.  HEP modified.  Goals are ongoing.    Personal Factors and Comorbidities  Comorbidity 3+    Comorbidities  OP, OA, cervical spondylosis, Breast cancer lumpectomy right July 2020 had radiation, Rt wrist fx    Stability/Clinical Decision Making  Stable/Uncomplicated    Rehab Potential  Excellent    PT Frequency  2x / week    PT Duration  6 weeks    PT Treatment/Interventions  ADLs/Self Care Home Management;Cryotherapy;Therapeutic exercise;Neuromuscular re-education;Patient/family education;Manual techniques;Dry needling;Taping    PT Next Visit Plan  assess response to DN. postural strengthening / education.    PT Home Exercise Plan  [email or web address]    Recommended Other Services  NO modalities  due to history of cancer    Consulted and Agree with Plan of Care  Patient       Patient will benefit from skilled therapeutic intervention in order to improve the following deficits and impairments:  Pain, Postural dysfunction, Decreased strength  Visit Diagnosis: Radiculopathy, cervical region     Problem List Patient Active Problem List   Diagnosis Date Noted  . Osteoporosis 03/10/2019  . Genetic testing 11/06/2018  . Tick bite 11/03/2018  . Family history of breast cancer   . Family history of colon cancer   . Family history of stomach cancer   . Family history of brain cancer   . Family history of kidney cancer   . Family history of oral cancer   . Family history of cancer   . Malignant neoplasm of upper-inner quadrant of right breast in female, estrogen receptor positive ([place]) 09/29/2018  . Plantar fasciitis, right 08/15/2018  . Left leg pain 11/07/2015  . Myalgia 11/07/2015  . Exercise counseling 03/01/2015  . Dysfunction of right rotator cuff 12/14/2014  . Skin tag 12/14/2014  . Tongue lesion 06/14/2014  . Primary osteoarthritis of both knees 05/17/2014  . Annual physical exam 08/20/2013  . 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  Reflux 04/17/2012  . Post-menopausal bleeding 10/08/2011  . Thrombocytopenia ([place]) 09/27/2011  . Hematuria 08/24/2011  . Trigeminal neuralgia 02/06/2011  . 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  . Radiculitis of left cervical region 04/06/2009  . Generalized anxiety disorder 05/31/2008   Kerin Perna, PTA 05/25/19 1:45 PM  [place], [place], [address] Phone: [phone]   Fax:  [phone]  Name: Joan Lopez MRN: [id number] Date of Birth: 23-Sep-1950  Madelyn Flavors, PT 05/25/19 1:46 PM  [facility] [place], [place] [address]  [phone] (office) [phone]  (fax)

## 2019-05-25 NOTE — Patient Instructions (Addendum)
Trigger Point Dry Needling  . What is Trigger Point Dry Needling (DN)? o DN is a physical therapy technique used to treat muscle pain and dysfunction. Specifically, DN helps deactivate muscle trigger points (muscle knots).  o A thin filiform needle is used to penetrate the skin and stimulate the underlying trigger point. The goal is for a local twitch response (LTR) to occur and for the trigger point to relax. No medication of any kind is injected during the procedure.   . What Does Trigger Point Dry Needling Feel Like?  o The procedure feels different for each individual patient. Some patients report that they do not actually feel the needle enter the skin and overall the process is not painful. Very mild bleeding may occur. However, many patients feel a deep cramping in the muscle in which the needle was inserted. This is the local twitch response.   Marland Kitchen How Will I feel after the treatment? o Soreness is normal, and the onset of soreness may not occur for a few hours. Typically this soreness does not last longer than two days.  o Bruising is uncommon, however; ice can be used to decrease any possible bruising.  o In rare cases feeling tired or nauseous after the treatment is normal. In addition, your symptoms may get worse before they get better, this period will typically not last longer than 24 hours.   . What Can I do After My Treatment? o Increase your hydration by drinking more water for the next 24 hours. o You may place ice or heat on the areas treated that have become sore, however, do not use heat on inflamed or bruised areas. Heat often brings more relief post needling. o You can continue your regular activities, but vigorous activity is not recommended initially after the treatment for 24 hours. o DN is best combined with other physical therapy such as strengthening, stretching, and other therapies.  Access Code: [email or web address]  URL: [email or web address]  Date: 05/25/2019   Prepared by: Kerin Perna   Exercises  Seated Cervical Retraction - 10 reps - 1-3 sets - 5 sec hold - 1x daily - 7x weekly  Doorway Pec Stretch at 90 Degrees Abduction - 3 reps - 1 sets - 15 seconds hold - 1x daily - 7x weekly  Standing Scapular Retraction - 5 reps - 5-10 seconds hold - 3x daily - 7x weekly  Standing Radial Nerve Glide - 5 reps - 1 sets - 1x daily - 7x weekly

## 2019-05-27 ENCOUNTER — Encounter: Payer: Self-pay | Admitting: Physical Therapy

## 2019-05-27 ENCOUNTER — Ambulatory Visit (INDEPENDENT_AMBULATORY_CARE_PROVIDER_SITE_OTHER): Payer: Medicare Other | Admitting: Physical Therapy

## 2019-05-27 ENCOUNTER — Other Ambulatory Visit: Payer: Self-pay

## 2019-05-27 DIAGNOSIS — M5412 Radiculopathy, cervical region: Secondary | ICD-10-CM

## 2019-05-27 NOTE — Therapy (Signed)
[place], [place], [address] Phone: [phone]   Fax:  [phone]  Physical Therapy Treatment  Patient Details  Name: Joan Lopez MRN: [id number] Date of Birth: [birth year] Referring Provider (PT): Aundria Mems   Encounter Date: 05/27/2019  PT End of Session - 05/27/19 1605    Visit Number  3    Number of Visits  12    Date for PT Re-Evaluation  07/01/19    PT Start Time  1602    PT Stop Time  1638    PT Time Calculation (min)  36 min    Activity Tolerance  Patient tolerated treatment well    Behavior During Therapy  [facility] for tasks assessed/performed       Past Medical History:  Diagnosis Date  . Abnormal LFTs 10/29/2012  . Acquired bilateral renal cysts   . Arthritis   . Bilateral bunions   . Constipation 10/16/2011  . DDD (degenerative disc disease)    spine w narrowing  . Dehydration, mild 12/26/2011  . Diverticulitis   . Exercise counseling 11/14/2011  . Family history of brain cancer   . Family history of breast cancer   . Family history of cancer   . Family history of colon cancer   . Family history of kidney cancer   . Family history of oral cancer   . Family history of stomach cancer   . Fuch's endothelial dystrophy   . Guttate psoriasis   . Hematuria 08/24/2011  . Hemorrhoids   . History of chicken pox   . Hyperlipidemia   . Kidney stones   . Lipoma of arm 07/13/2012  . Lipoma of arm 10/07/2012   Left   . Mass of arm 07/13/2012   Has had them in past    . 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 Post-menopausal bleeding 10/08/2011  . Reflux 04/17/2012  . Renal insufficiency 01/11/2013  . Thrombocytopenia ([employer]) 09/27/2011    Past Surgical History:  Procedure Laterality Date  . APPENDECTOMY  1986  . BREAST LUMPECTOMY WITH RADIOACTIVE SEED AND SENTINEL LYMPH NODE BIOPSY Right 10/24/2018   Procedure: RIGHT BREAST LUMPECTOMY WITH RADIOACTIVE SEED AND RIGHT AXILLARY  SENTINEL LYMPH NODE BIOPSY, RIGHT BLUE DYE INJECTION;  Surgeon: Fanny Skates, MD;  Location: [facility];  Service: General;  Laterality: Right;  PEC BLOCK  . CESAREAN SECTION    . CHOLECYSTECTOMY    . COLONOSCOPY  2005   (records here)[place] - diverticulosis and hemorrhoids  . CORNEAL TRANSPLANT  2013  . CYSTOSCOPY WITH RETROGRADE PYELOGRAM, URETEROSCOPY AND STENT PLACEMENT Right 10/22/2013   Procedure: CYSTOSCOPY WITH RETROGRADE PYELOGRAM, URETEROSCOPY ;  Surgeon: Raynelle Bring, MD;  Location: [facility];  Service: Urology;  Laterality: Right;  . LIPOMA EXCISION  2014   x 3, both arms  . TONSILLECTOMY     possible adenoids  . TOTAL ABDOMINAL HYSTERECTOMY  1991   for fibroids  . TUBAL LIGATION    . WRIST FRACTURE SURGERY     right, titanium plate and pins    There were no vitals filed for this visit.  Subjective Assessment - 05/27/19 1606    Subjective  Pt reports she continues to have intermittent radicular symptoms in Lt arm to elbow. she reports she has noticed some increased pins and needles in elbow after completing doorway stretch as well as when driving.  She has taken her last dose of meloxicam today.    Pertinent History  OP,  OA, cervical spondylosis, Breast cancer lumpectomy right July 2020 had radiation, Rt wrist fx    Diagnostic tests  MRI - chronic spondylosis possibly affecting left C5 and/or C7 nerve root    Patient Stated Goals  to get rid of tingling    Currently in Pain?  No/denies    Pain Score  0-No pain    Pain Location  Arm    Pain Orientation  Left    Pain Onset  More than a month ago         [facility] PT Assessment - 05/27/19 0001      Assessment   Medical Diagnosis  radiculitis left cervical region    Referring Provider (PT)  Aundria Mems    Onset Date/Surgical Date  10/15/18    Hand Dominance  Right    Next MD Visit  6 weeks    Prior Therapy  no       [facility] Adult PT Treatment/Exercise - 05/27/19 0001      Self-Care   Posture   cues on upright posture; educated pt on use of lumbar roll in car and chair to maintain upright posture and spinal curves. pt verbalized understanding.       Neck Exercises: Seated   Cervical Rotation  Right;5 reps   with head nods.    Cervical Rotation Limitations  radicular symptoms with Lt rotation     Shoulder Rolls  Backwards;5 reps    Other Seated Exercise  ulnar nerve glides (bird man) x 5 reps       Shoulder Exercises: Supine   Other Supine Exercises  hooklying on pool noodle (with pillow and towel under head);  sustained stretch with LUE abdct ~80 deg x 1 min, then nerve flossing.        Shoulder Exercises: Stretch   Other Shoulder Stretches  midlevel doorway stretch x 15 sec x 3 reps; Lt high position doorway stretch x 2 reps of 10-15 sec       Manual Therapy   Soft tissue mobilization  IASTM to Lt tricep, posterior Lt shoulder, upper trap, cervical paraspinals.  STM to [employer].     Myofascial Release  Lt pec release                   PT Long Term Goals - 05/20/19 1604      PT LONG TERM GOAL #1   Title  Ind with HEP    Time  6    Period  Weeks    Status  New    Target Date  07/01/19      PT LONG TERM GOAL #2   Title  Patient to report decreased incidence of tingling in left UE by 90% or more.    Time  6    Period  Weeks    Status  New      PT LONG TERM GOAL #3   Title  Patient able to get to sleep without difficulty from LUE    Time  6    Period  Weeks    Status  New      PT LONG TERM GOAL #4   Title  Patient to demo in clinic without cueing  and verbalize importance of good posture in preventing UE sx.    Time  6    Period  Weeks    Status  New            Plan - 05/27/19 1652    Clinical Impression  Statement  Pt reported increased radicular symptoms during session with Lt neck rotation in sitting as well as supine, and also when hooklying on pool noodle. Symptoms resolved with when head returned to neutral, and with addition of double  pillow in supine. Trial of IASTM (tolerated well) and nerve glides in supine/ seated. goals are ongoing at this time.    Personal Factors and Comorbidities  Comorbidity 3+    Comorbidities  OP, OA, cervical spondylosis, Breast cancer lumpectomy right July 2020 had radiation, Rt wrist fx    Stability/Clinical Decision Making  Stable/Uncomplicated    Rehab Potential  Excellent    PT Frequency  2x / week    PT Duration  6 weeks    PT Treatment/Interventions  ADLs/Self Care Home Management;Cryotherapy;Therapeutic exercise;Neuromuscular re-education;Patient/family education;Manual techniques;Dry needling;Taping    PT Next Visit Plan  postural strengthening / education.  manual therapy to Lt upper quarter.    PT Home Exercise Plan  [id number]    Recommended Other Services  NO modalities due to history of cancer.    Consulted and Agree with Plan of Care  Patient       Patient will benefit from skilled therapeutic intervention in order to improve the following deficits and impairments:  Pain, Postural dysfunction, Decreased strength  Visit Diagnosis: Radiculopathy, cervical region     Problem List Patient Active Problem List   Diagnosis Date Noted  . Osteoporosis 03/10/2019  . Genetic testing 11/06/2018  . Tick bite 11/03/2018  . Family history of breast cancer   . Family history of colon cancer   . Family history of stomach cancer   . Family history of brain cancer   . Family history of kidney cancer   . Family history of oral cancer   . Family history of cancer   . Malignant neoplasm of upper-inner quadrant of right breast in female, estrogen receptor positive ([place]) 09/29/2018  . Plantar fasciitis, right 08/15/2018  . Left leg pain 11/07/2015  . Myalgia 11/07/2015  . Exercise counseling 03/01/2015  . Dysfunction of right rotator cuff 12/14/2014  . Skin tag 12/14/2014  . Tongue lesion 06/14/2014  . Primary osteoarthritis of both knees 05/17/2014  . Annual physical exam 08/20/2013   . 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 Reflux 04/17/2012  . Post-menopausal bleeding 10/08/2011  . Thrombocytopenia ([place]) 09/27/2011  . Hematuria 08/24/2011  . Trigeminal neuralgia 02/06/2011  . 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  . Radiculitis of left cervical region 04/06/2009  . Generalized anxiety disorder 05/31/2008   Kerin Perna, PTA 05/27/19 5:50 PM  [place], [place], [address] Phone: [phone]   Fax:  [phone]  Name: Joan Lopez MRN: [id number] Date of Birth: Apr 01, 1951

## 2019-05-28 ENCOUNTER — Encounter: Payer: Medicare Other | Admitting: Physical Therapy

## 2019-05-29 ENCOUNTER — Telehealth: Payer: Self-pay | Admitting: *Deleted

## 2019-05-29 NOTE — Telephone Encounter (Signed)
Push through it and have her see me sometime early next week to reevaluate.

## 2019-05-29 NOTE — Telephone Encounter (Signed)
Pt notified of provider recommendations. 

## 2019-05-29 NOTE — Telephone Encounter (Signed)
Pt left vm stating that she woke up this morning and her fingers were swollen.  She's been on Gabapentin for a few weeks now and on Wednesday she increased it to tid.  She read that swelling is a side effect and just wanted know if she should push through it or drop back down to bid or any other suggestions you have for her.

## 2019-06-01 ENCOUNTER — Ambulatory Visit (INDEPENDENT_AMBULATORY_CARE_PROVIDER_SITE_OTHER): Payer: Medicare Other | Admitting: Rehabilitative and Restorative Service Providers"

## 2019-06-01 ENCOUNTER — Other Ambulatory Visit: Payer: Self-pay

## 2019-06-01 ENCOUNTER — Encounter: Payer: Self-pay | Admitting: Rehabilitative and Restorative Service Providers"

## 2019-06-01 DIAGNOSIS — M5412 Radiculopathy, cervical region: Secondary | ICD-10-CM

## 2019-06-01 NOTE — Patient Instructions (Signed)
Access Code: [email or web address]  URL: [email or web address]  Date: 06/01/2019  Prepared by: Rudell Cobb   Exercises Seated Cervical Retraction - 10 reps - 1-3 sets - 5 sec hold - 1x daily - 7x weekly Doorway Pec Stretch at 90 Degrees Abduction - 3 reps - 1 sets - 15 seconds hold - 1x daily - 7x weekly Standing Radial Nerve Glide - 5 reps - 1 sets - 1x daily - 7x weekly Prone Scapular Retraction Arms at Side - 10 reps - 1 sets - 2x daily - 7x weekly Supine Thoracic Mobilization Towel Roll Vertical with Arm Stretch - 1 reps - 1 sets - 2 minutes hold - 2x daily - 7x weekly

## 2019-06-01 NOTE — Therapy (Signed)
[place], [place], [address] Phone: [phone]   Fax:  [phone]  Physical Therapy Treatment  Patient Details  Name: ABBIEGAIL Lopez MRN: [id number] Date of Birth: 1950-05-15 Referring Provider (PT): Aundria Mems   Encounter Date: 06/01/2019  PT End of Session - 06/01/19 2154    Visit Number  4    Number of Visits  12    Date for PT Re-Evaluation  07/01/19    PT Start Time  1347    PT Stop Time  1435    PT Time Calculation (min)  48 min    Activity Tolerance  Patient tolerated treatment well    Behavior During Therapy  [facility] for tasks assessed/performed       Past Medical History:  Diagnosis Date  . Abnormal LFTs 10/29/2012  . Acquired bilateral renal cysts   . Arthritis   . Bilateral bunions   . Constipation 10/16/2011  . DDD (degenerative disc disease)    spine w narrowing  . Dehydration, mild 12/26/2011  . Diverticulitis   . Exercise counseling 11/14/2011  . Family history of brain cancer   . Family history of breast cancer   . Family history of cancer   . Family history of colon cancer   . Family history of kidney cancer   . Family history of oral cancer   . Family history of stomach cancer   . Fuch's endothelial dystrophy   . Guttate psoriasis   . Hematuria 08/24/2011  . Hemorrhoids   . History of chicken pox   . Hyperlipidemia   . Kidney stones   . Lipoma of arm 07/13/2012  . Lipoma of arm 10/07/2012   Left   . Mass of arm 07/13/2012   Has had them in past    . 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 Post-menopausal bleeding 10/08/2011  . Reflux 04/17/2012  . Renal insufficiency 01/11/2013  . Thrombocytopenia ([place]) 09/27/2011    Past Surgical History:  Procedure Laterality Date  . APPENDECTOMY  1986  . BREAST LUMPECTOMY WITH RADIOACTIVE SEED AND SENTINEL LYMPH NODE BIOPSY Right 10/24/2018   Procedure: RIGHT BREAST LUMPECTOMY WITH RADIOACTIVE SEED AND RIGHT AXILLARY  SENTINEL LYMPH NODE BIOPSY, RIGHT BLUE DYE INJECTION;  Surgeon: Fanny Skates, MD;  Location: [place] [place];  Service: General;  Laterality: Right;  PEC BLOCK  . CESAREAN SECTION    . CHOLECYSTECTOMY    . COLONOSCOPY  2005   (records here)[place] - diverticulosis and hemorrhoids  . CORNEAL TRANSPLANT  2013  . CYSTOSCOPY WITH RETROGRADE PYELOGRAM, URETEROSCOPY AND STENT PLACEMENT Right 10/22/2013   Procedure: CYSTOSCOPY WITH RETROGRADE PYELOGRAM, URETEROSCOPY ;  Surgeon: Raynelle Bring, MD;  [facility];  Service: Urology;  Laterality: Right;  . LIPOMA EXCISION  2014   x 3, both arms  . TONSILLECTOMY     possible adenoids  . TOTAL ABDOMINAL HYSTERECTOMY  1991   for fibroids  . TUBAL LIGATION    . WRIST FRACTURE SURGERY     right, titanium plate and pins    There were no vitals filed for this visit.  Subjective Assessment - 06/01/19 1347    Subjective  The patient is hoping to return to running tomorrow.  She is planning for the [place] the end of September (she has done 22 marathons). She reports the plantar fascitis has improved and she feels she can run 3-4 miles tomorrow.    Pertinent History  OP, OA, cervical spondylosis,  Breast cancer lumpectomy right July 2020 had radiation, Rt wrist fx    Diagnostic tests  MRI - chronic spondylosis possibly affecting left C5 and/or C7 nerve root    Patient Stated Goals  to get rid of tingling    Currently in Pain?  No/denies         [facility] PT Assessment - 06/01/19 1400      Palpation   Spinal mobility  Patient has mid to upper thoracic tightness and palpating L thoracic region lateral to spine reproduces arm tingling.  She also notes a "heavy" sensation in L lateral scapula at times.      Palpation comment  *Due to recent dx of osteoporosis, PT could not assess joint mobility.  PT used gentle self mobilization with towel for extension stretch of the spine.                   [facility] Adult PT  Treatment/Exercise - 06/01/19 1400      Neck Exercises: Supine   Other Supine Exercise  Towel roll perpendicular and then parallel to spine for thoracic extension self mobilization bringing arms to "T" position x 60 seconds, also performed wrist flexion/extension for nerve glides.  Patient notes tightness in anterior chest/pectoralis musculature into biceps.      Shoulder Exercises: Prone   Retraction  Strengthening;Both;10 reps    Retraction Limitations  With arms at neutral in prone + shoulder blade squeezes, then arms abducted to 90 degrees performing scap retraction x 10 reps    Other Prone Exercises  Prone on elbows performing lateral rocking R<>L for thoracic mobiization and extension.    Other Prone Exercises  Prone on elbows alternating UE reaching emphasizing shoulder stabilization and lengthening through the neck.      Shoulder Exercises: Sidelying   Other Sidelying Exercises  Sidelying passive scapular mobilization and then shoulder abduction stretch with tightness noted in subscapular/lateral scapular region.        Shoulder Exercises: Standing   Other Standing Exercises  tennis ball pressure point at lateral scapula against wall    Other Standing Exercises  Anterior chest stretch reviewed in door frame      Manual Therapy   Manual Therapy  Soft tissue mobilization;Scapular mobilization    Soft tissue mobilization  STM left upper trap, scalenes, pectoralis, biceps.    Scapular Mobilization  sidelying passive scapular mobilization             PT Education - 06/01/19 1435    Education Details  HEP-- removed standing scap retraction and added prone scapular retraction and towel roll stretch    Person(s) Educated  Patient    Methods  Explanation;Demonstration;Handout    Comprehension  Verbalized understanding;Returned demonstration          PT Long Term Goals - 05/20/19 1604      PT LONG TERM GOAL #1   Title  Ind with HEP    Time  6    Period  Weeks    Status   New    Target Date  07/01/19      PT LONG TERM GOAL #2   Title  Patient to report decreased incidence of tingling in left UE by 90% or more.    Time  6    Period  Weeks    Status  New      PT LONG TERM GOAL #3   Title  Patient able to get to sleep without difficulty from LUE    Time  6    Period  Weeks    Status  New      PT LONG TERM GOAL #4   Title  Patient to demo in clinic without cueing  and verbalize importance of good posture in preventing UE sx.    Time  6    Period  Weeks    Status  New            Plan - 06/01/19 1400    Clinical Impression Statement  The patient reports numbness and tingling is reproduced in most positions for stretching (door frame stretch, supine chest opening, and sidelying shoulder abduction).  She has an area of heaviness under axilla at lateral border of scapula and describes these as 2 separate sensations.  L lateral spinal palpation also reproduces tingling in the arm.  PT working on reducing tightness in postural muscles and progressed scapular strengthening to improve posture.    Personal Factors and Comorbidities  Comorbidity 3+    Comorbidities  OP, OA, cervical spondylosis, Breast cancer lumpectomy right July 2020 had radiation, Rt wrist fx    Stability/Clinical Decision Making  Stable/Uncomplicated    Rehab Potential  Excellent    PT Frequency  2x / week    PT Duration  6 weeks    PT Treatment/Interventions  ADLs/Self Care Home Management;Cryotherapy;Therapeutic exercise;Neuromuscular re-education;Patient/family education;Manual techniques;Dry needling;Taping    PT Next Visit Plan  postural strengthening / education.  manual therapy to Lt upper quarter.    PT Home Exercise Plan  [id number]    Consulted and Agree with Plan of Care  Patient       Patient will benefit from skilled therapeutic intervention in order to improve the following deficits and impairments:  Pain, Postural dysfunction, Decreased strength  Visit  Diagnosis: Radiculopathy, cervical region     Problem List Patient Active Problem List   Diagnosis Date Noted  . Osteoporosis 03/10/2019  . Genetic testing 11/06/2018  . Tick bite 11/03/2018  . Family history of breast cancer   . Family history of colon cancer   . Family history of stomach cancer   . Family history of brain cancer   . Family history of kidney cancer   . Family history of oral cancer   . Family history of cancer   . Malignant neoplasm of upper-inner quadrant of right breast in female, estrogen receptor positive ([place]) 09/29/2018  . Plantar fasciitis, right 08/15/2018  . Left leg pain 11/07/2015  . Myalgia 11/07/2015  . Exercise counseling 03/01/2015  . Dysfunction of right rotator cuff 12/14/2014  . Skin tag 12/14/2014  . Tongue lesion 06/14/2014  . Primary osteoarthritis of both knees 05/17/2014  . Annual physical exam 08/20/2013  . 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 Reflux 04/17/2012  . Post-menopausal bleeding 10/08/2011  . Thrombocytopenia ([place]) 09/27/2011  . Hematuria 08/24/2011  . Trigeminal neuralgia 02/06/2011  . 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  . Radiculitis of left cervical region 04/06/2009  . Generalized anxiety disorder 05/31/2008    Dalya Maselli, PT 06/01/2019, 10:11 PM  [facility] [place], [place], [address] Phone: [phone]   Fax:  [phone]  Name: Joan Lopez MRN: [id number] Date of Birth: [birth year]

## 2019-06-04 ENCOUNTER — Encounter: Payer: Medicare Other | Admitting: Physical Therapy

## 2019-06-09 ENCOUNTER — Other Ambulatory Visit: Payer: Self-pay

## 2019-06-09 ENCOUNTER — Ambulatory Visit (INDEPENDENT_AMBULATORY_CARE_PROVIDER_SITE_OTHER): Payer: Medicare Other | Admitting: Rehabilitative and Restorative Service Providers"

## 2019-06-09 DIAGNOSIS — M5412 Radiculopathy, cervical region: Secondary | ICD-10-CM | POA: Diagnosis not present

## 2019-06-09 NOTE — Patient Instructions (Signed)
Access Code: [email or web address]  URL: [email or web address]  Date: 06/09/2019  Prepared by: Rudell Cobb   Exercises Doorway Pec Stretch at 90 Degrees Abduction - 3 reps - 1 sets - 15 seconds hold - 1x daily - 7x weekly Standing Pec Stretch at [place] - 3 reps - 1 sets - 20 seconds hold - 2x daily - 7x weekly Standing Isometric Cervical Flexion with Anchored Resistance - 10 reps - 1 sets - 1x daily - 7x weekly Prone Scapular Retraction Arms at Side - 10 reps - 1 sets - 2x daily - 7x weekly Prone Single Shoulder Flexion - 10 reps - 1 sets - 2x daily - 7x weekly Supine Thoracic Mobilization Towel Roll Vertical with Arm Stretch - 1 reps - 1 sets - 2 minutes hold - 2x daily - 7x weekly

## 2019-06-09 NOTE — Therapy (Signed)
[facility] [place], [place], [address] Phone: [phone]   Fax:  [phone]  Physical Therapy Treatment  Patient Details  Name: Joan Lopez MRN: [id number] Date of Birth: 1950/10/08 Referring Provider (PT): Aundria Mems   Encounter Date: 06/09/2019  PT End of Session - 06/09/19 1018    Visit Number  5    Number of Visits  12    Date for PT Re-Evaluation  07/01/19    PT Start Time  0938    PT Stop Time  1018    PT Time Calculation (min)  40 min    Activity Tolerance  Patient tolerated treatment well    Behavior During Therapy  [facility] for tasks assessed/performed       Past Medical History:  Diagnosis Date  . Abnormal LFTs 10/29/2012  . Acquired bilateral renal cysts   . Arthritis   . Bilateral bunions   . Constipation 10/16/2011  . DDD (degenerative disc disease)    spine w narrowing  . Dehydration, mild 12/26/2011  . Diverticulitis   . Exercise counseling 11/14/2011  . Family history of brain cancer   . Family history of breast cancer   . Family history of cancer   . Family history of colon cancer   . Family history of kidney cancer   . Family history of oral cancer   . Family history of stomach cancer   . Fuch's endothelial dystrophy   . Guttate psoriasis   . Hematuria 08/24/2011  . Hemorrhoids   . History of chicken pox   . Hyperlipidemia   . Kidney stones   . Lipoma of arm 07/13/2012  . Lipoma of arm 10/07/2012   Left   . Mass of arm 07/13/2012   Has had them in past    . 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 Post-menopausal bleeding 10/08/2011  . Reflux 04/17/2012  . Renal insufficiency 01/11/2013  . Thrombocytopenia ([place]) 09/27/2011    Past Surgical History:  Procedure Laterality Date  . APPENDECTOMY  1986  . BREAST LUMPECTOMY WITH RADIOACTIVE SEED AND SENTINEL LYMPH NODE BIOPSY Right 10/24/2018   Procedure: RIGHT BREAST LUMPECTOMY WITH RADIOACTIVE SEED AND RIGHT AXILLARY  SENTINEL LYMPH NODE BIOPSY, RIGHT BLUE DYE INJECTION;  Surgeon: Fanny Skates, MD;  Location: [place];  Service: General;  Laterality: Right;  PEC BLOCK  . CESAREAN SECTION    . CHOLECYSTECTOMY    . COLONOSCOPY  2005   (records here)[place] - diverticulosis and hemorrhoids  . CORNEAL TRANSPLANT  2013  . CYSTOSCOPY WITH RETROGRADE PYELOGRAM, URETEROSCOPY AND STENT PLACEMENT Right 10/22/2013   Procedure: CYSTOSCOPY WITH RETROGRADE PYELOGRAM, URETEROSCOPY ;  Surgeon: Raynelle Bring, MD;  [facility];  Service: Urology;  Laterality: Right;  . LIPOMA EXCISION  2014   x 3, both arms  . TONSILLECTOMY     possible adenoids  . TOTAL ABDOMINAL HYSTERECTOMY  1991   for fibroids  . TUBAL LIGATION    . WRIST FRACTURE SURGERY     right, titanium plate and pins    There were no vitals filed for this visit.  Subjective Assessment - 06/09/19 0940    Subjective  Patient always gets pins and needles with chin tucks (usually just first set), no change with radial nerve flossing or door frame str etch, with prone scapular retraction, she occasionally gets pins and needles.  She ran 2 days last week x 4 miles.  Patient reports looking down while jogging, but  it didn't lead to tingling sensation.    Pertinent History  OP, OA, cervical spondylosis, Breast cancer lumpectomy right July 2020 had radiation, Rt wrist fx    Diagnostic tests  MRI - chronic spondylosis possibly affecting left C5 and/or C7 nerve root    Patient Stated Goals  to get rid of tingling    Currently in Pain?  No/denies                       [facility] Adult PT Treatment/Exercise - 06/09/19 0949      Self-Care   Self-Care  Other Self-Care Comments    Other Self-Care Comments   Discussed return to jogging with patient reporting she looks down for changes in the pavement but did not notice any tingling in her UE during jogging.  She plans to journal when tingling occurs as it has improved for sleeping and  she no longer notes lateral scapular discomfort.      Exercises   Exercises  Neck;Shoulder      Neck Exercises: Theraband   Other Theraband Exercises  Neck stabilization with red theraband anchored in door; band around forehead taking 2 steps forward, holding, slow 2 steps back to engage neck.  Patient notes fatigue without tingling sensation.  Modified HEP.      Neck Exercises: Standing   Other Standing Exercises  reviewed nerve glide with no stretch noted-- d/c from HEP      Shoulder Exercises: Prone   Retraction  Strengthening;Both;10 reps    Retraction Limitations  Cues for scapular retraction    Flexion  Strengthening;Both;10 reps    Flexion Limitations  More limitation noted on the R UE; no tingling provoked      Shoulder Exercises: Stretch   Wall Stretch - ABduction  2 reps;20 seconds    Wall Stretch - ABduction Limitations  Palm against wall fingers pointed up, elbow extension, rotation away from hand to open through the chest.             PT Education - 06/09/19 1018    Education Details  HEP    Person(s) Educated  Patient    Methods  Explanation;Demonstration;Handout    Comprehension  Verbalized understanding;Returned demonstration          PT Long Term Goals - 06/09/19 0946      PT LONG TERM GOAL #1   Title  Ind with HEP    Time  6    Period  Weeks    Status  New      PT LONG TERM GOAL #2   Title  Patient to report decreased incidence of tingling in left UE by 90% or more.    Time  6    Period  Weeks    Status  New      PT LONG TERM GOAL #3   Title  Patient able to get to sleep without difficulty from LUE    Time  6    Period  Weeks    Status  Achieved      PT LONG TERM GOAL #4   Title  Patient to demo in clinic without cueing  and verbalize importance of good posture in preventing UE sx.    Time  6    Period  Weeks    Status  New            Plan - 06/09/19 2224    Clinical Impression Statement  The patient notes less discomfort in  scapula and improved sleeping.  She continues with intermittent tingling in the L UE and plans to journal when this occurs.  PT and patient modified HEP to change chin tucks to cervical stabilization with t-band due to eliciting tingling sensation (possibly due to using anterior neck musculature).  Plan to continue progressing to LTGs.    Personal Factors and Comorbidities  Comorbidity 3+    Comorbidities  OP, OA, cervical spondylosis, Breast cancer lumpectomy right July 2020 had radiation, Rt wrist fx    Stability/Clinical Decision Making  Stable/Uncomplicated    Rehab Potential  Excellent    PT Frequency  2x / week    PT Duration  6 weeks    PT Treatment/Interventions  ADLs/Self Care Home Management;Cryotherapy;Therapeutic exercise;Neuromuscular re-education;Patient/family education;Manual techniques;Dry needling;Taping    PT Next Visit Plan  postural strengthening / education.  manual therapy to Lt upper quarter.    PT Home Exercise Plan  [id number]    Consulted and Agree with Plan of Care  Patient       Patient will benefit from skilled therapeutic intervention in order to improve the following deficits and impairments:  Pain, Postural dysfunction, Decreased strength  Visit Diagnosis: Radiculopathy, cervical region     Problem List Patient Active Problem List   Diagnosis Date Noted  . Osteoporosis 03/10/2019  . Genetic testing 11/06/2018  . Tick bite 11/03/2018  . Family history of breast cancer   . Family history of colon cancer   . Family history of stomach cancer   . Family history of brain cancer   . Family history of kidney cancer   . Family history of oral cancer   . Family history of cancer   . Malignant neoplasm of upper-inner quadrant of right breast in female, estrogen receptor positive ([place]) 09/29/2018  . Plantar fasciitis, right 08/15/2018  . Left leg pain 11/07/2015  . Myalgia 11/07/2015  . Exercise counseling 03/01/2015  . Dysfunction of right rotator cuff  12/14/2014  . Skin tag 12/14/2014  . Tongue lesion 06/14/2014  . Primary osteoarthritis of both knees 05/17/2014  . Annual physical exam 08/20/2013  . 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 Reflux 04/17/2012  . Post-menopausal bleeding 10/08/2011  . Thrombocytopenia ([place]) 09/27/2011  . Hematuria 08/24/2011  . Trigeminal neuralgia 02/06/2011  . 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  . Radiculitis of left cervical region 04/06/2009  . Generalized anxiety disorder 05/31/2008    Breckyn Troyer, PT 06/09/2019, 10:26 PM  [facility] [place], [place], [address] Phone: [phone]   Fax:  [phone]  Name: DAKODAH GARRAND MRN: [id number] Date of Birth: 10-Apr-1951

## 2019-06-11 ENCOUNTER — Ambulatory Visit (INDEPENDENT_AMBULATORY_CARE_PROVIDER_SITE_OTHER): Payer: Medicare Other | Admitting: Physical Therapy

## 2019-06-11 ENCOUNTER — Encounter: Payer: Self-pay | Admitting: Physical Therapy

## 2019-06-11 ENCOUNTER — Other Ambulatory Visit: Payer: Self-pay

## 2019-06-11 DIAGNOSIS — M5412 Radiculopathy, cervical region: Secondary | ICD-10-CM

## 2019-06-11 NOTE — Therapy (Signed)
[place], [place], [address] Phone: [phone]   Fax:  [phone]  Physical Therapy Treatment  Patient Details  Name: Joan Lopez MRN: [id number] Date of Birth: Oct 22, 1950 Referring Provider (PT): Aundria Mems   Encounter Date: 06/11/2019  PT End of Session - 06/11/19 1017    Visit Number  6    Number of Visits  12    Date for PT Re-Evaluation  07/01/19    PT Start Time  1018    PT Stop Time  1059    PT Time Calculation (min)  41 min    Activity Tolerance  Patient tolerated treatment well    Behavior During Therapy  [facility] for tasks assessed/performed       Past Medical History:  Diagnosis Date  . Abnormal LFTs 10/29/2012  . Acquired bilateral renal cysts   . Arthritis   . Bilateral bunions   . Constipation 10/16/2011  . DDD (degenerative disc disease)    spine w narrowing  . Dehydration, mild 12/26/2011  . Diverticulitis   . Exercise counseling 11/14/2011  . Family history of brain cancer   . Family history of breast cancer   . Family history of cancer   . Family history of colon cancer   . Family history of kidney cancer   . Family history of oral cancer   . Family history of stomach cancer   . Fuch's endothelial dystrophy   . Guttate psoriasis   . Hematuria 08/24/2011  . Hemorrhoids   . History of chicken pox   . Hyperlipidemia   . Kidney stones   . Lipoma of arm 07/13/2012  . Lipoma of arm 10/07/2012   Left   . Mass of arm 07/13/2012   Has had them in past    . 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 Post-menopausal bleeding 10/08/2011  . Reflux 04/17/2012  . Renal insufficiency 01/11/2013  . Thrombocytopenia ([place]) 09/27/2011    Past Surgical History:  Procedure Laterality Date  . APPENDECTOMY  1986  . BREAST LUMPECTOMY WITH RADIOACTIVE SEED AND SENTINEL LYMPH NODE BIOPSY Right 10/24/2018   Procedure: RIGHT BREAST LUMPECTOMY WITH RADIOACTIVE SEED AND RIGHT AXILLARY  SENTINEL LYMPH NODE BIOPSY, RIGHT BLUE DYE INJECTION;  Surgeon: Fanny Skates, MD;  Location: [place];  Service: General;  Laterality: Right;  PEC BLOCK  . CESAREAN SECTION    . CHOLECYSTECTOMY    . COLONOSCOPY  2005   (records here)[place] - diverticulosis and hemorrhoids  . CORNEAL TRANSPLANT  2013  . CYSTOSCOPY WITH RETROGRADE PYELOGRAM, URETEROSCOPY AND STENT PLACEMENT Right 10/22/2013   Procedure: CYSTOSCOPY WITH RETROGRADE PYELOGRAM, URETEROSCOPY ;  Surgeon: Raynelle Bring, MD;  [facility];  Service: Urology;  Laterality: Right;  . LIPOMA EXCISION  2014   x 3, both arms  . TONSILLECTOMY     possible adenoids  . TOTAL ABDOMINAL HYSTERECTOMY  1991   for fibroids  . TUBAL LIGATION    . WRIST FRACTURE SURGERY     right, titanium plate and pins    There were no vitals filed for this visit.  Subjective Assessment - 06/11/19 1232    Subjective  Patient continues to report improvement but still experiencing intermittent pins and needles.    Pertinent History  OP, OA, cervical spondylosis, Breast cancer lumpectomy right July 2020 had radiation, Rt wrist fx    Diagnostic tests  MRI - chronic spondylosis possibly affecting left C5 and/or C7 nerve root  Patient Stated Goals  to get rid of tingling    Currently in Pain?  No/denies                       [facility] Adult PT Treatment/Exercise - 06/11/19 0001      Neck Exercises: Theraband   Other Theraband Exercises  Neck stab with yellow band x 10 cues to keep chin tuck engaged      Neck Exercises: Prone   W Back  10 reps    Shoulder Extension  10 reps    Upper Extremity Flexion with Stabilization  Flexion    UE Flexion with Stabilization Limitations  Reviewed from HEP; patient shown modifications of arm position      Shoulder Exercises: Stretch   Wall Stretch - ABduction  20 seconds;3 reps   one rep post IASTM   Wall Stretch - ABduction Limitations  pt feels in dorsal lower arm into hand     Other Shoulder Stretches  mid level single arm doorway stretch post IASTM    Other Shoulder Stretches  on foam roller: T stretch 2 x 60 sec; other variations with right arm in 90/90, Y position      Manual Therapy   Manual Therapy  Soft tissue mobilization    Soft tissue mobilization  IASTM to left dorsal forearm to decrease restrictions             PT Education - 06/11/19 1234    Education Details  HEP reviewed and corrections made.    Person(s) Educated  Patient    Methods  Explanation;Demonstration;Handout    Comprehension  Returned demonstration          PT Long Term Goals - 06/09/19 0946      PT LONG TERM GOAL #1   Title  Ind with HEP    Time  6    Period  Weeks    Status  New      PT LONG TERM GOAL #2   Title  Patient to report decreased incidence of tingling in left UE by 90% or more.    Time  6    Period  Weeks    Status  New      PT LONG TERM GOAL #3   Title  Patient able to get to sleep without difficulty from LUE    Time  6    Period  Weeks    Status  Achieved      PT LONG TERM GOAL #4   Title  Patient to demo in clinic without cueing  and verbalize importance of good posture in preventing UE sx.    Time  6    Period  Weeks    Status  New            Plan - 06/11/19 1235    Clinical Impression Statement  Patient had questions re: her HEP today so we corrected and/or modified those to maximize benefit. She forgot her journal re: tingling but she is doing it. She did fairly well with cervical stab with tband, but loses chin tuck if not focusing on the exercise. She had dorsal foream pull with single arm chest stretch at doorway. IASTM resolved these sx.    Comorbidities  OP, OA, cervical spondylosis, Breast cancer lumpectomy right July 2020 had radiation, Rt wrist fx    PT Treatment/Interventions  ADLs/Self Care Home Management;Cryotherapy;Therapeutic exercise;Neuromuscular re-education;Patient/family education;Manual techniques;Dry  needling;Taping    PT Next Visit Plan  postural strengthening / education.  manual therapy to Lt upper quarter.    PT Home Exercise Plan  [id number]       Patient will benefit from skilled therapeutic intervention in order to improve the following deficits and impairments:  Pain, Postural dysfunction, Decreased strength  Visit Diagnosis: Radiculopathy, cervical region     Problem List Patient Active Problem List   Diagnosis Date Noted  . Osteoporosis 03/10/2019  . Genetic testing 11/06/2018  . Tick bite 11/03/2018  . Family history of breast cancer   . Family history of colon cancer   . Family history of stomach cancer   . Family history of brain cancer   . Family history of kidney cancer   . Family history of oral cancer   . Family history of cancer   . Malignant neoplasm of upper-inner quadrant of right breast in female, estrogen receptor positive ([place]) 09/29/2018  . Plantar fasciitis, right 08/15/2018  . Left leg pain 11/07/2015  . Myalgia 11/07/2015  . Exercise counseling 03/01/2015  . Dysfunction of right rotator cuff 12/14/2014  . Skin tag 12/14/2014  . Tongue lesion 06/14/2014  . Primary osteoarthritis of both knees 05/17/2014  . Annual physical exam 08/20/2013  . 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 Reflux 04/17/2012  . Post-menopausal bleeding 10/08/2011  . Thrombocytopenia ([place]) 09/27/2011  . Hematuria 08/24/2011  . Trigeminal neuralgia 02/06/2011  . 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  . Radiculitis of left cervical region 04/06/2009  . Generalized anxiety disorder 05/31/2008    Madelyn Flavors PT 06/11/2019, 1:10 PM  [facility] [place], [place], [address] Phone: [phone]   Fax:  [phone]  Name: Joan Lopez MRN: [id number] Date of Birth: [birth year]

## 2019-06-16 ENCOUNTER — Encounter: Payer: Self-pay | Admitting: Physical Therapy

## 2019-06-16 ENCOUNTER — Other Ambulatory Visit: Payer: Self-pay

## 2019-06-16 ENCOUNTER — Ambulatory Visit (INDEPENDENT_AMBULATORY_CARE_PROVIDER_SITE_OTHER): Payer: Medicare Other | Admitting: Physical Therapy

## 2019-06-16 DIAGNOSIS — M5412 Radiculopathy, cervical region: Secondary | ICD-10-CM | POA: Diagnosis not present

## 2019-06-16 NOTE — Therapy (Signed)
[place], [place], [address] Phone: [phone]   Fax:  [phone]  Physical Therapy Treatment  Patient Details  Name: Joan Lopez MRN: [id number] Date of Birth: 11-Nov-1950 Referring Provider (PT): Aundria Mems   Encounter Date: 06/16/2019  PT End of Session - 06/16/19 1104    Visit Number  7    Number of Visits  12    Date for PT Re-Evaluation  07/01/19    PT Start Time  1105    PT Stop Time  1143    PT Time Calculation (min)  38 min    Activity Tolerance  Patient tolerated treatment well    Behavior During Therapy  [facility] for tasks assessed/performed       Past Medical History:  Diagnosis Date  . Abnormal LFTs 10/29/2012  . Acquired bilateral renal cysts   . Arthritis   . Bilateral bunions   . Constipation 10/16/2011  . DDD (degenerative disc disease)    spine w narrowing  . Dehydration, mild 12/26/2011  . Diverticulitis   . Exercise counseling 11/14/2011  . Family history of brain cancer   . Family history of breast cancer   . Family history of cancer   . Family history of colon cancer   . Family history of kidney cancer   . Family history of oral cancer   . Family history of stomach cancer   . Fuch's endothelial dystrophy   . Guttate psoriasis   . Hematuria 08/24/2011  . Hemorrhoids   . History of chicken pox   . Hyperlipidemia   . Kidney stones   . Lipoma of arm 07/13/2012  . Lipoma of arm 10/07/2012   Left   . Mass of arm 07/13/2012   Has had them in past    . 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 Post-menopausal bleeding 10/08/2011  . Reflux 04/17/2012  . Renal insufficiency 01/11/2013  . Thrombocytopenia ([place]) 09/27/2011    Past Surgical History:  Procedure Laterality Date  . APPENDECTOMY  1986  . BREAST LUMPECTOMY WITH RADIOACTIVE SEED AND SENTINEL LYMPH NODE BIOPSY Right 10/24/2018   Procedure: RIGHT BREAST LUMPECTOMY WITH RADIOACTIVE SEED AND RIGHT AXILLARY SENTINEL  LYMPH NODE BIOPSY, RIGHT BLUE DYE INJECTION;  Surgeon: Fanny Skates, MD;  Location: [place];  Service: General;  Laterality: Right;  PEC BLOCK  . CESAREAN SECTION    . CHOLECYSTECTOMY    . COLONOSCOPY  2005   (records here)[place] - diverticulosis and hemorrhoids  . CORNEAL TRANSPLANT  2013  . CYSTOSCOPY WITH RETROGRADE PYELOGRAM, URETEROSCOPY AND STENT PLACEMENT Right 10/22/2013   Procedure: CYSTOSCOPY WITH RETROGRADE PYELOGRAM, URETEROSCOPY ;  Surgeon: Raynelle Bring, MD;  [facility];  Service: Urology;  Laterality: Right;  . LIPOMA EXCISION  2014   x 3, both arms  . TONSILLECTOMY     possible adenoids  . TOTAL ABDOMINAL HYSTERECTOMY  1991   for fibroids  . TUBAL LIGATION    . WRIST FRACTURE SURGERY     right, titanium plate and pins    There were no vitals filed for this visit.  Subjective Assessment - 06/16/19 1107    Subjective  Pt reports performing the exercises of HEP 2x/day is a bit too much. She has been monitoring her symptoms with HEP and during the day. Her Lt forearm was sore for a few days after IASTM last session.    Pertinent History  OP, OA, cervical spondylosis, Breast cancer lumpectomy right  July 2020 had radiation, Rt wrist fx    Diagnostic tests  MRI - chronic spondylosis possibly affecting left C5 and/or C7 nerve root    Patient Stated Goals  to get rid of tingling    Currently in Pain?  No/denies    Pain Score  0-No pain    Pain Location  Arm    Pain Orientation  Left    Aggravating Factors   driving         [facility] PT Assessment - 06/16/19 0001      Assessment   Medical Diagnosis  radiculitis left cervical region    Referring Provider (PT)  Aundria Mems    Onset Date/Surgical Date  10/15/18    Hand Dominance  Right    Next MD Visit  06/24/19    Prior Therapy  no       [facility] Adult PT Treatment/Exercise - 06/16/19 0001      Neck Exercises: Supine   Cervical Isometrics  Flexion;3 secs;10 reps   head press into  pillow with chin tuck     Neck Exercises: Prone   W Back Limitations  verbally reviewed from HEP.    UE Flexion with Stabilization Limitations  verbally reviewed from HEP; patient shown modifications of arm position      Shoulder Exercises: Stretch   Wall Stretch - ABduction  20 seconds;2 reps    Wall Stretch - ABduction Limitations  pt feels in dorsal lower arm into hand    Other Shoulder Stretches  midlevel bilat pec stretch x 15 sec;  unilateral high doorway stretch x 20 sec x 2 reps each arm     Other Shoulder Stretches  hooklying on 1/2  foam roller: T stretch 2 x 60 sec;  bilat snow angels x 10 reps       Manual Therapy   Soft tissue mobilization  IASTM to left wrist to medial/lateral epicondyle musculature to decrease fascial restrictions     reviewed and modified HEP; pt verbalized understanding.       PT Long Term Goals - 06/09/19 0946      PT LONG TERM GOAL #1   Title  Ind with HEP    Time  6    Period  Weeks    Status  New      PT LONG TERM GOAL #2   Title  Patient to report decreased incidence of tingling in left UE by 90% or more.    Time  6    Period  Weeks    Status  New      PT LONG TERM GOAL #3   Title  Patient able to get to sleep without difficulty from LUE    Time  6    Period  Weeks    Status  Achieved      PT LONG TERM GOAL #4   Title  Patient to demo in clinic without cueing  and verbalize importance of good posture in preventing UE sx.    Time  6    Period  Weeks    Status  New            Plan - 06/16/19 1153    Clinical Impression Statement  Pt now reporting 75% reduction in LUE symptoms; progressing well towards LTG#2.  Reviewed HEP and modified pec stretch to high level doorway with good response. She did have some fascial tightness in Lt wrist flexors; reduced with IASTM to this area.  Only one incidence of pins  and needles in Lt forearm during sessions, and this was when she was sitting with slumped posture and head turned Lt;  resolved with position change.    Comorbidities  OP, OA, cervical spondylosis, Breast cancer lumpectomy right July 2020 had radiation, Rt wrist fx    Rehab Potential  Excellent    PT Frequency  2x / week    PT Duration  6 weeks    PT Treatment/Interventions  ADLs/Self Care Home Management;Cryotherapy;Therapeutic exercise;Neuromuscular re-education;Patient/family education;Manual techniques;Dry needling;Taping    PT Next Visit Plan  postural strengthening / education.  manual therapy to Lt upper quarter.    PT Home Exercise Plan  [id number]       Patient will benefit from skilled therapeutic intervention in order to improve the following deficits and impairments:  Pain, Postural dysfunction, Decreased strength  Visit Diagnosis: Radiculopathy, cervical region     Problem List Patient Active Problem List   Diagnosis Date Noted  . Osteoporosis 03/10/2019  . Genetic testing 11/06/2018  . Tick bite 11/03/2018  . Family history of breast cancer   . Family history of colon cancer   . Family history of stomach cancer   . Family history of brain cancer   . Family history of kidney cancer   . Family history of oral cancer   . Family history of cancer   . Malignant neoplasm of upper-inner quadrant of right breast in female, estrogen receptor positive ([place]) 09/29/2018  . Plantar fasciitis, right 08/15/2018  . Left leg pain 11/07/2015  . Myalgia 11/07/2015  . Exercise counseling 03/01/2015  . Dysfunction of right rotator cuff 12/14/2014  . Skin tag 12/14/2014  . Tongue lesion 06/14/2014  . Primary osteoarthritis of both knees 05/17/2014  . Annual physical exam 08/20/2013  . 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 Reflux 04/17/2012  . Post-menopausal bleeding 10/08/2011  . Thrombocytopenia ([place]) 09/27/2011  . Hematuria 08/24/2011  . Trigeminal neuralgia 02/06/2011  . 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  . Radiculitis of left cervical region 04/06/2009  . Generalized anxiety disorder  05/31/2008   Kerin Perna, PTA 06/16/19 12:05 PM  [place], [place], [address] Phone: [phone]   Fax:  [phone]  Name: Joan Lopez MRN: [id number] Date of Birth: 11/05/1950

## 2019-06-18 ENCOUNTER — Encounter: Payer: Self-pay | Admitting: Rehabilitative and Restorative Service Providers"

## 2019-06-18 ENCOUNTER — Other Ambulatory Visit: Payer: Self-pay

## 2019-06-18 ENCOUNTER — Ambulatory Visit (INDEPENDENT_AMBULATORY_CARE_PROVIDER_SITE_OTHER): Payer: Medicare Other | Admitting: Rehabilitative and Restorative Service Providers"

## 2019-06-18 DIAGNOSIS — M5412 Radiculopathy, cervical region: Secondary | ICD-10-CM

## 2019-06-18 NOTE — Therapy (Signed)
[place], [place], [address] Phone: [phone]   Fax:  [phone]  Physical Therapy Treatment  Patient Details  Name: Joan Lopez MRN: [id number] Date of Birth: Mar 21, 1951 Referring Provider (PT): Aundria Mems   Encounter Date: 06/18/2019  PT End of Session - 06/18/19 1153    Visit Number  8    Number of Visits  12    Date for PT Re-Evaluation  07/01/19    PT Start Time  [id number]    PT Stop Time  1230    PT Time Calculation (min)  45 min    Activity Tolerance  Patient tolerated treatment well    Behavior During Therapy  [facility] for tasks assessed/performed       Past Medical History:  Diagnosis Date  . Abnormal LFTs 10/29/2012  . Acquired bilateral renal cysts   . Arthritis   . Bilateral bunions   . Constipation 10/16/2011  . DDD (degenerative disc disease)    spine w narrowing  . Dehydration, mild 12/26/2011  . Diverticulitis   . Exercise counseling 11/14/2011  . Family history of brain cancer   . Family history of breast cancer   . Family history of cancer   . Family history of colon cancer   . Family history of kidney cancer   . Family history of oral cancer   . Family history of stomach cancer   . Fuch's endothelial dystrophy   . Guttate psoriasis   . Hematuria 08/24/2011  . Hemorrhoids   . History of chicken pox   . Hyperlipidemia   . Kidney stones   . Lipoma of arm 07/13/2012  . Lipoma of arm 10/07/2012   Left   . Mass of arm 07/13/2012   Has had them in past    . 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 Post-menopausal bleeding 10/08/2011  . Reflux 04/17/2012  . Renal insufficiency 01/11/2013  . Thrombocytopenia ([place]) 09/27/2011    Past Surgical History:  Procedure Laterality Date  . APPENDECTOMY  1986  . BREAST LUMPECTOMY WITH RADIOACTIVE SEED AND SENTINEL LYMPH NODE BIOPSY Right 10/24/2018   Procedure: RIGHT BREAST LUMPECTOMY WITH RADIOACTIVE SEED AND RIGHT AXILLARY SENTINEL  LYMPH NODE BIOPSY, RIGHT BLUE DYE INJECTION;  Surgeon: Fanny Skates, MD;  Location: [place] [facility];  Service: General;  Laterality: Right;  PEC BLOCK  . CESAREAN SECTION    . CHOLECYSTECTOMY    . COLONOSCOPY  2005   (records here)[place] - diverticulosis and hemorrhoids  . CORNEAL TRANSPLANT  2013  . CYSTOSCOPY WITH RETROGRADE PYELOGRAM, URETEROSCOPY AND STENT PLACEMENT Right 10/22/2013   Procedure: CYSTOSCOPY WITH RETROGRADE PYELOGRAM, URETEROSCOPY ;  Surgeon: Raynelle Bring, MD;  Location: [facility];  Service: Urology;  Laterality: Right;  . LIPOMA EXCISION  2014   x 3, both arms  . TONSILLECTOMY     possible adenoids  . TOTAL ABDOMINAL HYSTERECTOMY  1991   for fibroids  . TUBAL LIGATION    . WRIST FRACTURE SURGERY     right, titanium plate and pins    There were no vitals filed for this visit.  Subjective Assessment - 06/18/19 1148    Subjective  The patient reports she is going to do a [employer] 1/2 marathon.  She does not have any episodes of waking with tingling.  She gets tingling with exercises intermittently, and when driving.  Overall, significant reduction in sensation.    Pertinent History  OP, OA, cervical spondylosis, Breast cancer  lumpectomy right July 2020 had radiation, Rt wrist fx    Patient Stated Goals  to get rid of tingling    Currently in Pain?  No/denies                       [facility] Adult PT Treatment/Exercise - 06/18/19 1306      Exercises   Exercises  Neck;Shoulder;Hand      Neck Exercises: Standing   Other Standing Exercises  Standing nerve glide exercises focused on ulnar nerve including:  arm abduction to 90 + MCP flexion > extending to arm extension at 90 degrees with wrist extension and finger extension x 5 reps.  Performed palms together focused on keeping L 5th digit extended (it pulls into flexion).        Hand Exercises for Cervical Radiculopathy   Other Hand Exercise for Cervical Radiculopathy  performed wrist  stretch at wall into extension working on 5th digit exension and opening through anterior chest muslculature.      Hand Exercises   MCPJ Extension  Strengthening;Left    MCPJ Extension Limitations  with hand on table working on PIP extension L 5th digit    PIPJ Extension  Strengthening;Left    PIPJ Extension Limitations  with hand on table working on PIP extension L 5th digit    Thumb Opposition  with each finger    Other Hand Exercises  *In addition to patient and PT discussing posture, frequency of pins and needles senation, she discussed L UE limitations when learning violin (which she practices 1-2 hours/day.  She is unable to extend the 5th digit at the PIP to reach cords.               PT Education - 06/18/19 1305    Education Details  added nerve glides to HEP    Person(s) Educated  Patient    Methods  Explanation;Demonstration;Handout    Comprehension  Verbalized understanding;Returned demonstration          PT Long Term Goals - 06/18/19 1305      PT LONG TERM GOAL #1   Title  Ind with HEP    Time  6    Period  Weeks    Status  On-going    Target Date  07/01/19      PT LONG TERM GOAL #2   Title  Patient to report decreased incidence of tingling in left UE by 90% or more.    Baseline  reports 75% reduction    Time  6    Period  Weeks    Status  On-going      PT LONG TERM GOAL #3   Title  Patient able to get to sleep without difficulty from LUE    Time  6    Period  Weeks    Status  Achieved      PT LONG TERM GOAL #4   Title  Patient to demo in clinic without cueing  and verbalize importance of good posture in preventing UE sx.    Time  6    Period  Weeks    Status  On-going            Plan - 06/18/19 1312    Clinical Impression Statement  The patient continues to report reduced pins and needlesl sensations.  She is doing HEP and able to demonstrate wall stretch for anterior pectoralis.  Pins and needles is located in the triceps region and at  times in her forearm.  At today's visit based on patient c/o difficulty learning violin due to 5th digit ROM, we noted flexion at the PIP with difficulty performing extension when isolating that joint.  PT added exercises to include neural flossing and finger extension ROM.    Comorbidities  OP, OA, cervical spondylosis, Breast cancer lumpectomy right July 2020 had radiation, Rt wrist fx    Rehab Potential  Excellent    PT Frequency  2x / week    PT Duration  6 weeks    PT Treatment/Interventions  ADLs/Self Care Home Management;Cryotherapy;Therapeutic exercise;Neuromuscular re-education;Patient/family education;Manual techniques;Dry needling;Taping    PT Next Visit Plan  postural strengthening / education.  manual therapy to Lt upper quarter, neural gliding; consider working to early d/c as she is doing well with exercises.    PT Home Exercise Plan  [id number]    Consulted and Agree with Plan of Care  Patient       Patient will benefit from skilled therapeutic intervention in order to improve the following deficits and impairments:  Pain, Postural dysfunction, Decreased strength  Visit Diagnosis: Radiculopathy, cervical region     Problem List Patient Active Problem List   Diagnosis Date Noted  . Osteoporosis 03/10/2019  . Genetic testing 11/06/2018  . Tick bite 11/03/2018  . Family history of breast cancer   . Family history of colon cancer   . Family history of stomach cancer   . Family history of brain cancer   . Family history of kidney cancer   . Family history of oral cancer   . Family history of cancer   . Malignant neoplasm of upper-inner quadrant of right breast in female, estrogen receptor positive ([place]) 09/29/2018  . Plantar fasciitis, right 08/15/2018  . Left leg pain 11/07/2015  . Myalgia 11/07/2015  . Exercise counseling 03/01/2015  . Dysfunction of right rotator cuff 12/14/2014  . Skin tag 12/14/2014  . Tongue lesion 06/14/2014  . Primary osteoarthritis of both  knees 05/17/2014  . Annual physical exam 08/20/2013  . 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 Reflux 04/17/2012  . Post-menopausal bleeding 10/08/2011  . Thrombocytopenia ([place]) 09/27/2011  . Hematuria 08/24/2011  . Trigeminal neuralgia 02/06/2011  . 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  . Radiculitis of left cervical region 04/06/2009  . Generalized anxiety disorder 05/31/2008    Joan Lopez, PT 06/18/2019, 1:15 PM  [facility] [place], [place], [address] Phone: [phone]   Fax:  [phone]  Name: Joan Lopez MRN: [id number] Date of Birth: April 15, 1951

## 2019-06-18 NOTE — Patient Instructions (Signed)
Access Code: [email or web address]  URL: [email or web address]  Date: 06/18/2019  Prepared by: Rudell Cobb   Exercises Doorway Pec Stretch at 90 Degrees Abduction - 2 reps - 1 sets - 15-30 seconds hold - 2x daily - 7x weekly Single Arm Doorway Pec Stretch at 120 Degrees Abduction - 2 reps - 15-30 hold - 2x daily - 7x weekly Supine Thoracic Mobilization Towel Roll Vertical with Arm Stretch - 1 reps - 1 sets - 1-2 minutes hold - 1-2x daily - 7x weekly Snow Angels on Foam Roll - 5 reps - 1 sets - 1x daily - 7x weekly Standing Isometric Cervical Flexion with Anchored Resistance - 10 reps - 1 sets - 1x daily - 7x weekly Prone Scapular Retraction Arms at Side - 10 reps - 1 sets - 1x daily - 7x weekly Prone Single Shoulder Flexion - 10 reps - 1 sets - 1x daily - 7x weekly Ulnar Nerve Flossing - 10 reps - 1 sets - 2x daily - 7x weekly Ulnar Nerve Flossing - 5 reps - 1 sets - 20 seconds hold - 2x daily - 7x weekly

## 2019-06-23 ENCOUNTER — Ambulatory Visit (INDEPENDENT_AMBULATORY_CARE_PROVIDER_SITE_OTHER): Payer: Medicare Other | Admitting: Physical Therapy

## 2019-06-23 ENCOUNTER — Other Ambulatory Visit: Payer: Self-pay

## 2019-06-23 DIAGNOSIS — M5412 Radiculopathy, cervical region: Secondary | ICD-10-CM | POA: Diagnosis not present

## 2019-06-23 NOTE — Therapy (Signed)
[place], [place], [address] Phone: [phone]   Fax:  [phone]  Physical Therapy Treatment  Patient Details  Name: Joan Lopez MRN: [id number] Date of Birth: 11-18-1950 Referring Provider (PT): Aundria Mems   Encounter Date: 06/23/2019  PT End of Session - 06/23/19 1103    Visit Number  9    Number of Visits  12    Date for PT Re-Evaluation  07/01/19    PT Start Time  1104    PT Stop Time  1140    PT Time Calculation (min)  36 min    Activity Tolerance  Patient tolerated treatment well    Behavior During Therapy  [facility] for tasks assessed/performed       Past Medical History:  Diagnosis Date  . Abnormal LFTs 10/29/2012  . Acquired bilateral renal cysts   . Arthritis   . Bilateral bunions   . Constipation 10/16/2011  . DDD (degenerative disc disease)    spine w narrowing  . Dehydration, mild 12/26/2011  . Diverticulitis   . Exercise counseling 11/14/2011  . Family history of brain cancer   . Family history of breast cancer   . Family history of cancer   . Family history of colon cancer   . Family history of kidney cancer   . Family history of oral cancer   . Family history of stomach cancer   . Fuch's endothelial dystrophy   . Guttate psoriasis   . Hematuria 08/24/2011  . Hemorrhoids   . History of chicken pox   . Hyperlipidemia   . Kidney stones   . Lipoma of arm 07/13/2012  . Lipoma of arm 10/07/2012   Left   . Mass of arm 07/13/2012   Has had them in past    . 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 Post-menopausal bleeding 10/08/2011  . Reflux 04/17/2012  . Renal insufficiency 01/11/2013  . Thrombocytopenia ([place]) 09/27/2011    Past Surgical History:  Procedure Laterality Date  . APPENDECTOMY  1986  . BREAST LUMPECTOMY WITH RADIOACTIVE SEED AND SENTINEL LYMPH NODE BIOPSY Right 10/24/2018   Procedure: RIGHT BREAST LUMPECTOMY WITH RADIOACTIVE SEED AND RIGHT AXILLARY SENTINEL  LYMPH NODE BIOPSY, RIGHT BLUE DYE INJECTION;  Surgeon: Fanny Skates, MD;  Location: [place];  Service: General;  Laterality: Right;  PEC BLOCK  . CESAREAN SECTION    . CHOLECYSTECTOMY    . COLONOSCOPY  2005   (records here)[place] - diverticulosis and hemorrhoids  . CORNEAL TRANSPLANT  2013  . CYSTOSCOPY WITH RETROGRADE PYELOGRAM, URETEROSCOPY AND STENT PLACEMENT Right 10/22/2013   Procedure: CYSTOSCOPY WITH RETROGRADE PYELOGRAM, URETEROSCOPY ;  Surgeon: Raynelle Bring, MD;  [facility];  Service: Urology;  Laterality: Right;  . LIPOMA EXCISION  2014   x 3, both arms  . TONSILLECTOMY     possible adenoids  . TOTAL ABDOMINAL HYSTERECTOMY  1991   for fibroids  . TUBAL LIGATION    . WRIST FRACTURE SURGERY     right, titanium plate and pins    There were no vitals filed for this visit.  Subjective Assessment - 06/23/19 1105    Subjective  Pt reports that she is not getting pins and needles, it's more of a tingling sensation.  She gets occasional pins and needles when she drives.    Patient Stated Goals  to get rid of tingling    Currently in Pain?  No/denies    Pain Score  0-No pain         [facility] PT Assessment - 06/23/19 0001      Assessment   Medical Diagnosis  radiculitis left cervical region    Referring Provider (PT)  Aundria Mems    Onset Date/Surgical Date  10/15/18    Hand Dominance  Right    Next MD Visit  06/24/19    Prior Therapy  no       [facility] Adult PT Treatment/Exercise - 06/23/19 0001      Shoulder Exercises: Supine   Other Supine Exercises  hooklying:  arms abdct to 90 deg off of bed x 20 sec x 2 reps, then 2 more reps with wrist flex/ext for nerve glide.  hooklying on noodle/ pillow under head:  10 snow angels to tolerance.        Shoulder Exercises: Seated   Other Seated Exercises  ulnar nerve glide with LUE ( arm abdct to 90 with elbow/wrist flexion to ext) from HEP x 10 reps with VC, demo and mirror for feedback on form.     Lt fifth finger ext stretch with elbow straight and bent x 15 sec x 5 reps, with cues to support DIP and PIP joints.     Other Seated Exercises  Lt hand finger ext and thumb abdct with rubberband resistance x 10 reps; pt shown variations with thumb and finger ext - to assist with strengthening for playing violin.   Ulnar nerve stretch (owl man) x 20 sec (minimal stretch felt)      Shoulder Exercises: Stretch   Other Shoulder Stretches  verbally reviewed doorway stretch         PT Long Term Goals - 06/18/19 1305      PT LONG TERM GOAL #1   Title  Ind with HEP    Time  6    Period  Weeks    Status  On-going    Target Date  07/01/19      PT LONG TERM GOAL #2   Title  Patient to report decreased incidence of tingling in left UE by 90% or more.    Baseline  reports 75% reduction    Time  6    Period  Weeks    Status  On-going      PT LONG TERM GOAL #3   Title  Patient able to get to sleep without difficulty from LUE    Time  6    Period  Weeks    Status  Achieved      PT LONG TERM GOAL #4   Title  Patient to demo in clinic without cueing  and verbalize importance of good posture in preventing UE sx.    Time  6    Period  Weeks    Status  On-going            Plan - 06/23/19 1307    Clinical Impression Statement  Pt reporting reduction in frequency and quality of radicular symptoms with certain motions of Lt shoulder. She had some radicular symptoms with shoulder horiz abdct (arms off table); reduced with repositioning arm ~80 deg and aligning neck to neutral with small pillow under head.  She remains concerned at the tightness of 5th digit on Lt hand; added 2 exercise to HEP for this.  Progressing well towards remaining goals.    Comorbidities  OP, OA, cervical spondylosis, Breast cancer lumpectomy right July 2020 had radiation, Rt wrist fx    Rehab Potential  Excellent  PT Frequency  2x / week    PT Duration  6 weeks    PT Treatment/Interventions  ADLs/Self Care Home  Management;Cryotherapy;Therapeutic exercise;Neuromuscular re-education;Patient/family education;Manual techniques;Dry needling;Taping    PT Next Visit Plan  continue postural strengthening and d/c planning.  Pt interested to wean off of medicine, and would like to see how exercises will benefit her after she is off of medicine.    PT Home Exercise Plan  [id number]    Consulted and Agree with Plan of Care  Patient       Patient will benefit from skilled therapeutic intervention in order to improve the following deficits and impairments:  Pain, Postural dysfunction, Decreased strength  Visit Diagnosis: Radiculopathy, cervical region     Problem List Patient Active Problem List   Diagnosis Date Noted  . Osteoporosis 03/10/2019  . Genetic testing 11/06/2018  . Tick bite 11/03/2018  . Family history of breast cancer   . Family history of colon cancer   . Family history of stomach cancer   . Family history of brain cancer   . Family history of kidney cancer   . Family history of oral cancer   . Family history of cancer   . Malignant neoplasm of upper-inner quadrant of right breast in female, estrogen receptor positive ([place]) 09/29/2018  . Plantar fasciitis, right 08/15/2018  . Left leg pain 11/07/2015  . Myalgia 11/07/2015  . Exercise counseling 03/01/2015  . Dysfunction of right rotator cuff 12/14/2014  . Skin tag 12/14/2014  . Tongue lesion 06/14/2014  . Primary osteoarthritis of both knees 05/17/2014  . Annual physical exam 08/20/2013  . 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 Reflux 04/17/2012  . Post-menopausal bleeding 10/08/2011  . Thrombocytopenia ([place]) 09/27/2011  . Hematuria 08/24/2011  . Trigeminal neuralgia 02/06/2011  . 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  . Radiculitis of left cervical region 04/06/2009  . Generalized anxiety disorder 05/31/2008   Kerin Perna, PTA 06/23/19 1:13 PM  [place], [place], [address] Phone: [phone]   Fax:  [phone]  Name: Joan Lopez MRN: [id number] Date of Birth: November 24, 1950

## 2019-06-24 ENCOUNTER — Ambulatory Visit (INDEPENDENT_AMBULATORY_CARE_PROVIDER_SITE_OTHER): Payer: Medicare Other | Admitting: Sports Medicine

## 2019-06-24 DIAGNOSIS — M5412 Radiculopathy, cervical region: Secondary | ICD-10-CM

## 2019-06-24 NOTE — Progress Notes (Signed)
    Procedures performed today:    None.  Independent interpretation of notes and tests performed by another provider:   None.  Impression and Recommendations:    Radiculitis of left cervical region This pleasant 69-year-old female returns, she has cervical DDD with left periscapular radiculitis, completely now resolved with physical therapy, gabapentin. She is going to discontinue her gabapentin, continue her home rehab exercises, we discussed the option of continuing intermittently with gabapentin, I will leave this up to her, return as needed.    ___________________________________________ Gwen Her. Dianah Field, M.D., ABFM., CAQSM. Primary Care and [place] Instructor of [place] of [facility] of Medicine

## 2019-06-24 NOTE — Assessment & Plan Note (Signed)
This pleasant 69-year-old female returns, she has cervical DDD with left periscapular radiculitis, completely now resolved with physical therapy, gabapentin. She is going to discontinue her gabapentin, continue her home rehab exercises, we discussed the option of continuing intermittently with gabapentin, I will leave this up to her, return as needed.

## 2019-06-26 ENCOUNTER — Encounter: Payer: Self-pay | Admitting: Rehabilitative and Restorative Service Providers"

## 2019-06-26 ENCOUNTER — Ambulatory Visit (INDEPENDENT_AMBULATORY_CARE_PROVIDER_SITE_OTHER): Payer: Medicare Other | Admitting: Rehabilitative and Restorative Service Providers"

## 2019-06-26 ENCOUNTER — Other Ambulatory Visit: Payer: Self-pay

## 2019-06-26 DIAGNOSIS — M5412 Radiculopathy, cervical region: Secondary | ICD-10-CM

## 2019-06-26 NOTE — Therapy (Signed)
[place] [address] [place], [place], [address] Phone: [phone]   Fax:  [phone]  Physical Therapy Treatment and Discharge Summary  Patient Details  Name: Joan Lopez MRN: [id number] Date of Birth: [birth year] Referring Provider (PT): Aundria Mems   Encounter Date: 06/26/2019  PT End of Session - 06/26/19 1132    Visit Number  10    Number of Visits  12    Date for PT Re-Evaluation  07/01/19    PT Start Time  1106    PT Stop Time  1145    PT Time Calculation (min)  39 min    Activity Tolerance  Patient tolerated treatment well    Behavior During Therapy  [facility] for tasks assessed/performed       Past Medical History:  Diagnosis Date  . Abnormal LFTs 10/29/2012  . Acquired bilateral renal cysts   . Arthritis   . Bilateral bunions   . Constipation 10/16/2011  . DDD (degenerative disc disease)    spine w narrowing  . Dehydration, mild 12/26/2011  . Diverticulitis   . Exercise counseling 11/14/2011  . Family history of brain cancer   . Family history of breast cancer   . Family history of cancer   . Family history of colon cancer   . Family history of kidney cancer   . Family history of oral cancer   . Family history of stomach cancer   . Fuch's endothelial dystrophy   . Guttate psoriasis   . Hematuria 08/24/2011  . Hemorrhoids   . History of chicken pox   . Hyperlipidemia   . Kidney stones   . Lipoma of arm 07/13/2012  . Lipoma of arm 10/07/2012   Left   . Mass of arm 07/13/2012   Has had them in past    . 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 Post-menopausal bleeding 10/08/2011  . Reflux 04/17/2012  . Renal insufficiency 01/11/2013  . Thrombocytopenia ([place]) 09/27/2011    Past Surgical History:  Procedure Laterality Date  . APPENDECTOMY  1986  . BREAST LUMPECTOMY WITH RADIOACTIVE SEED AND SENTINEL LYMPH NODE BIOPSY Right 10/24/2018   Procedure: RIGHT BREAST LUMPECTOMY WITH RADIOACTIVE SEED AND  RIGHT AXILLARY SENTINEL LYMPH NODE BIOPSY, RIGHT BLUE DYE INJECTION;  Surgeon: Fanny Skates, MD;  Location: [facility];  Service: General;  Laterality: Right;  PEC BLOCK  . CESAREAN SECTION    . CHOLECYSTECTOMY    . COLONOSCOPY  2005   (records here)[place] - diverticulosis and hemorrhoids  . CORNEAL TRANSPLANT  2013  . CYSTOSCOPY WITH RETROGRADE PYELOGRAM, URETEROSCOPY AND STENT PLACEMENT Right 10/22/2013   Procedure: CYSTOSCOPY WITH RETROGRADE PYELOGRAM, URETEROSCOPY ;  Surgeon: Raynelle Bring, MD;  Location: [facility];  Service: Urology;  Laterality: Right;  . LIPOMA EXCISION  2014   x 3, both arms  . TONSILLECTOMY     possible adenoids  . TOTAL ABDOMINAL HYSTERECTOMY  1991   for fibroids  . TUBAL LIGATION    . WRIST FRACTURE SURGERY     right, titanium plate and pins    There were no vitals filed for this visit.  Subjective Assessment - 06/26/19 1109    Subjective  The patient saw Dr. Jacklynn Bue earlier this week. She is planing to reduce gabapentin and forgot to take it last night.  She has noticed finger tips have a numbness and feel cold.    Pertinent History  OP, OA, cervical spondylosis, Breast cancer lumpectomy right July 2020  had radiation, Rt wrist fx    Patient Stated Goals  to get rid of tingling    Currently in Pain?  No/denies                       [facility] Adult PT Treatment/Exercise - 06/26/19 1605      Self-Care   Self-Care  Other Self-Care Comments    Other Self-Care Comments   discussed posture and positioning recommending setting a timer to go off every few hours to get in habit of checking posture since patient notes she does not think about it during the day.  Also discussed yoga as possible wellness routine to improve postural awareness and offered some suggestions for online instruction at this time.      Exercises   Exercises  Neck;Shoulder;Hand      Neck Exercises: Supine   Other Supine Exercise  Reviewed supine towel roll  stretch and foam roll "snow angel" exercise working on positioning.  We modified to avoid abducting shoulders beyond horizontal plane (patient was doing off corner of bed) and this decreases pins and needles sensation.      Hand Exercises for Cervical Radiculopathy   Other Hand Exercise for Cervical Radiculopathy  performed standing wrist extension against wall working on 5th digit extension      Hand Exercises   Other Hand Exercises  reviewed finger extension stretch and also performed palms together for 5th finger stretch.                  PT Long Term Goals - 06/26/19 1132      PT LONG TERM GOAL #1   Title  Ind with HEP    Time  6    Period  Weeks    Status  Achieved      PT LONG TERM GOAL #2   Title  Patient to report decreased incidence of tingling in left UE by 90% or more.    Baseline  reports 80% reduction in pins and needles sensation.    Time  6    Period  Weeks    Status  Partially Met      PT LONG TERM GOAL #3   Title  Patient able to get to sleep without difficulty from LUE    Time  6    Period  Weeks    Status  Achieved      PT LONG TERM GOAL #4   Title  Patient to demo in clinic without cueing  and verbalize importance of good posture in preventing UE sx.    Baseline  The patient has rounded shoulder and forward head position.    Time  6    Period  Weeks    Status  Partially Met            Plan - 06/26/19 1608    Clinical Impression Statement  The patient has partially met LTGs with significant reduction in pins and needles/ radiating symptoms in the L UE.  She continues with intermittent symptoms.  She has HEP to address for post d/c.    Comorbidities  OP, OA, cervical spondylosis, Breast cancer lumpectomy right July 2020 had radiation, Rt wrist fx    Rehab Potential  Excellent    PT Frequency  2x / week    PT Duration  6 weeks    PT Treatment/Interventions  ADLs/Self Care Home Management;Cryotherapy;Therapeutic exercise;Neuromuscular  re-education;Patient/family education;Manual techniques;Dry needling;Taping    PT Next Visit Plan  discharge.    PT Home Exercise Plan  [id number]    Consulted and Agree with Plan of Care  Patient       Patient will benefit from skilled therapeutic intervention in order to improve the following deficits and impairments:  Pain, Postural dysfunction, Decreased strength  Visit Diagnosis: Radiculopathy, cervical region    PHYSICAL THERAPY DISCHARGE SUMMARY  Visits from Start of Care: 10  Current functional level related to goals / functional outcomes: See above   Remaining deficits: Occasional pins and needles. Postural changes:  Rounded shoulders forward head position   Education / Equipment: Home program  Plan: Patient agrees to discharge.  Patient goals were partially met. Patient is being discharged due to meeting the stated rehab goals.  ?????         Thank you for the referral of this patient. Rudell Cobb, MPT Problem List Patient Active Problem List   Diagnosis Date Noted  . Osteoporosis 03/10/2019  . Genetic testing 11/06/2018  . Tick bite 11/03/2018  . Family history of breast cancer   . Family history of colon cancer   . Family history of stomach cancer   . Family history of brain cancer   . Family history of kidney cancer   . Family history of oral cancer   . Family history of cancer   . Malignant neoplasm of upper-inner quadrant of right breast in female, estrogen receptor positive ([place]) 09/29/2018  . Plantar fasciitis, right 08/15/2018  . Left leg pain 11/07/2015  . Myalgia 11/07/2015  . Exercise counseling 03/01/2015  . Dysfunction of right rotator cuff 12/14/2014  . Skin tag 12/14/2014  . Tongue lesion 06/14/2014  . Primary osteoarthritis of both knees 05/17/2014  . Annual physical exam 08/20/2013  . 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 Reflux 04/17/2012  . Post-menopausal bleeding 10/08/2011  . Thrombocytopenia ([place]) 09/27/2011  .  Hematuria 08/24/2011  . Trigeminal neuralgia 02/06/2011  . 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  . Radiculitis of left cervical region 04/06/2009  . Generalized anxiety disorder 05/31/2008    Jamilette Suchocki, PT 06/26/2019, 4:10 PM  [facility] [place], [place], [address] Phone: [phone]   Fax:  [phone]  Name: Joan Lopez MRN: [id number] Date of Birth: 19-Dec-1950

## 2019-06-26 NOTE — Patient Instructions (Signed)
Access Code: [email or web address]  URL: [email or web address]  Date: 06/26/2019  Prepared by: Rudell Cobb   Exercises Doorway Pec Stretch at 90 Degrees Abduction - 2 reps - 1 sets - 15-30 seconds hold - 1x daily - 7x weekly Single Arm Doorway Pec Stretch at 120 Degrees Abduction - 2 reps - 15-30 hold - 1x daily - 7x weekly Supine Thoracic Mobilization Towel Roll Vertical with Arm Stretch - 1 reps - 1 sets - 1-2 minutes hold - 1x daily - 7x weekly Snow Angels on Foam Roll - 5 reps - 1 sets - 1x daily - 7x weekly Standing Isometric Cervical Flexion with Anchored Resistance - 10 reps - 1 sets - 1x daily - 7x weekly Prone Scapular Retraction Arms at Side - 10 reps - 1 sets - 1x daily - 7x weekly Prone Single Shoulder Flexion - 10 reps - 1 sets - 1x daily - 7x weekly Ulnar Nerve Flossing - 5 reps - 1 sets - 20 seconds hold - 2x daily - 7x weekly Resisted Finger Extension and Thumb Abduction - 10 reps - 1 sets - 1x daily - 7x weekly Seated Finger DIP Extension PROM - 10 reps - 3 sets - 1x daily - 7x weekly

## 2019-06-30 ENCOUNTER — Encounter: Payer: Medicare Other | Admitting: Physical Therapy

## 2019-07-03 ENCOUNTER — Encounter: Payer: Medicare Other | Admitting: Physical Therapy

## 2019-07-13 ENCOUNTER — Telehealth: Payer: Self-pay | Admitting: *Deleted

## 2019-07-13 NOTE — Telephone Encounter (Signed)
The pt called want to change her appt until May, has a lot going on in April.

## 2019-07-28 ENCOUNTER — Other Ambulatory Visit: Payer: Self-pay

## 2019-07-28 ENCOUNTER — Ambulatory Visit
Admission: RE | Admit: 2019-07-28 | Discharge: 2019-07-28 | Disposition: A | Discharge status: Home / Self Care | Payer: Medicare Other | Source: Ambulatory Visit | Attending: Oncology | Admitting: Oncology

## 2019-07-28 DIAGNOSIS — G500 Trigeminal neuralgia: Secondary | ICD-10-CM

## 2019-07-28 DIAGNOSIS — Z853 Personal history of malignant neoplasm of breast: Secondary | ICD-10-CM | POA: Diagnosis not present

## 2019-07-28 DIAGNOSIS — M818 Other osteoporosis without current pathological fracture: Secondary | ICD-10-CM

## 2019-07-28 DIAGNOSIS — R928 Other abnormal and inconclusive findings on diagnostic imaging of breast: Secondary | ICD-10-CM | POA: Diagnosis not present

## 2019-07-28 DIAGNOSIS — D696 Thrombocytopenia, unspecified: Secondary | ICD-10-CM

## 2019-07-28 DIAGNOSIS — M722 Plantar fascial fibromatosis: Secondary | ICD-10-CM

## 2019-07-28 DIAGNOSIS — C50211 Malignant neoplasm of upper-inner quadrant of right female breast: Secondary | ICD-10-CM

## 2019-07-28 DIAGNOSIS — Z170 Estrogen receptor positive status [ER+]: Secondary | ICD-10-CM

## 2019-07-28 HISTORY — DX: Personal history of irradiation: Z92.3

## 2019-07-30 ENCOUNTER — Ambulatory Visit: Payer: Self-pay | Admitting: Radiation Oncology

## 2019-08-26 NOTE — Progress Notes (Signed)
Radiation Oncology         [phone] ________________________________  Name: Joan Lopez MRN: [id number]  Date: 08/27/2019  DOB: 12-18-1950  Follow-Up Visit Note  CC: Silverio Decamp, MD  Silverio Decamp,*    ICD-10-CM   1. Malignant neoplasm of upper-inner quadrant of right breast in female, estrogen receptor positive ([place])  C50.211    Z17.0     Diagnosis: StageIA (pT1b, pN0) RightBreast UIQ Invasive Ductal Carcinoma, ER+/ PR+/ Her2-, Grade1  Interval Since Last Radiation: Eight months.  12/01/2018 through 12/29/2018 Site Technique Total Dose Dose per Fx Completed Fx Beam Energies  Breast: Breast_Rt 3D 40.05/40.05 Gy 2.67 15/15 6X  Breast: Breast_Rt_Bst specialPort 10/10 Gy 2 5/5 9E   Narrative:  The patient returns today for routine follow-up. She was last seen by Dr. Jana Hakim on 05/11/2019, during which time she was doing very well on the Tamoxifen.  Of note, the patient had an MRI of her cervical spine on 05/16/2019 for neck pain without associated injury. Results showed chronic spondylosis, bony foraminal narrowing, and facet osteoarthritis. No cord compression.  She had a bilateral diagnostic mammogram on 07/28/2019 that did not show any evidence of malignancy in either breast.  On review of systems, she reports occasional discomfort in the axillary region consistent basis. She denies pain within the breast area nipple discharge or bleeding.  She denies any problems with swelling in her right arm or hand.  ALLERGIES:  is allergic to augmentin [amoxicillin-pot clavulanate].  Meds: Current Outpatient Medications  Medication Sig Dispense Refill  . calcium carbonate (OSCAL) 1500 (600 Ca) MG TABS tablet Take 1 tablet (1,500 mg total) by mouth 2 (two) times daily with a meal.    . gabapentin (NEURONTIN) 300 MG capsule One tab PO qHS for a week, then BID for a week, then TID. May double weekly to a max of 3,622m/day 90 capsule 3  . loteprednol  (LOTEMAX) 0.5 % ophthalmic suspension Place 1 drop into both eyes as directed. Reported on 06/07/2015    . tamoxifen (NOLVADEX) 20 MG tablet Take 1 tablet (20 mg total) by mouth daily. 90 tablet 4   No current facility-administered medications for this encounter.    Physical Findings: The patient is in no acute distress. Patient is alert and oriented.  height is 5' (1.524 m) and weight is 130 lb 12.8 oz (59.3 kg). Her temperature is 98.7 F (37.1 C). Her blood pressure is 153/86 (abnormal) and her pulse is 64. Her respiration is 20 and oxygen saturation is 99%. . Lungs are clear to auscultation bilaterally. Heart has regular rate and rhythm. No palpable cervical, supraclavicular, or axillary adenopathy. Abdomen soft, non-tender, normal bowel sounds. Left Breast: no palpable mass, nipple discharge or bleeding. Right Breast: Mild hyperpigmentation changes noted.  Patient does have some edema in the breast.  No dominant mass appreciated breast nipple discharge or bleeding  Lab Findings: Lab Results  Component Value Date   WBC 4.7 05/11/2019   HGB 13.5 05/11/2019   HCT 40.7 05/11/2019   MCV 92.1 05/11/2019   PLT 144 (L) 05/11/2019    Radiographic Findings: MM DIAG BREAST TOMO BILATERAL  Result Date: 07/28/2019 CLINICAL DATA:  69year old female with history of right breast cancer post lumpectomy 10/24/2018 followed by radiation therapy and tamoxifen. EXAM: DIGITAL DIAGNOSTIC BILATERAL MAMMOGRAM WITH CAD AND TOMO COMPARISON:  Previous exams. ACR Breast Density Category b: There are scattered areas of fibroglandular density. FINDINGS: No suspicious masses or calcifications are seen in either breast.  New surgical changes are present within the upper central to slightly inner right breast related to interval lumpectomy. Skin and trabecular thickening consistent with radiation therapy. Spot compression magnification CC view of the right breast lumpectomy site was performed. There is no mammographic  evidence of locally recurrent malignancy. Mammographic images were processed with CAD. IMPRESSION: New lumpectomy site right breast. No mammographic evidence of malignancy in either breast. RECOMMENDATION: Diagnostic mammogram is suggested in 1 year. (Code:[id number]-B-01Y) I have discussed the findings and recommendations with the patient. If applicable, a reminder letter will be sent to the patient regarding the next appointment. BI-RADS CATEGORY  2: Benign. Electronically Signed   By: Everlean Alstrom M.D.   On: 07/28/2019 12:25    Impression: StageIA (pT1b, pN0) RightBreast UIQ Invasive Ductal Carcinoma, ER+/ PR+/ Her2-, Grade1  No evidence of recurrence on clinical exam today. She is tolerating adjuvant hormonal therapy well.  Patient has some edema in the breast but does not seem to be bothering her.  We discussed potential consideration for physical therapy if this became an issue later on.   Plan: She is scheduled to follow-up with Dr. Jana Hakim on 05/11/2020. She will follow-up with radiation oncology on as needed basis.  She will also follow-up with general surgery proximally once a year. __________________________________   Blair Promise, PhD, MD  This document serves as a record of services personally performed by Gery Pray, MD. It was created on his behalf by Clerance Lav, a trained medical scribe. The creation of this record is based on the scribe's personal observations and the provider's statements to them. This document has been checked and approved by the attending provider.

## 2019-08-26 NOTE — Progress Notes (Signed)
Patient presents for a f/u visit with Dr. Sondra Come. Last visit was 10/20.  Pain: Intermittent pain that started on left shoulder, and then also occurred on right. Patient went to PCP for evaluation and was referred for physical therapy and started on gabapentin. She admits she is not as diligent about doing her PT nor consistent in taking the gabapentin, but she notes the pain eases or resolves when she is adhearing to both.  Fatigue: She denies any fatigue, but does note she feels depressed which can cause her to lose her motivation/will to do anything. She is trying to manage on her own before she seeks out professional assistance. "I want to know I've tried everything I can, and attempted to handle it myself before I go that route [therapy]" Skin irritation: Breast is still a slightly darker color and feels "more dense" per patient, but skin is intact and healed.  Lymphedema: None  Vitals:   08/27/19 1007  BP: (!) 153/86  Pulse: 64  Resp: 20  Temp: 98.7 F (37.1 C)  SpO2: 99%   Wt Readings from Last 3 Encounters:  08/27/19 130 lb 12.8 oz (59.3 kg)  05/11/19 127 lb 11.2 oz (57.9 kg)  02/24/19 118 lb (53.5 kg)

## 2019-08-27 ENCOUNTER — Other Ambulatory Visit: Payer: Self-pay

## 2019-08-27 ENCOUNTER — Encounter: Payer: Self-pay | Admitting: Radiation Oncology

## 2019-08-27 ENCOUNTER — Ambulatory Visit
Admission: RE | Admit: 2019-08-27 | Discharge: 2019-08-27 | Disposition: A | Discharge status: Home / Self Care | Payer: Medicare Other | Source: Ambulatory Visit | Attending: Radiation Oncology | Admitting: Radiation Oncology

## 2019-08-27 VITALS — BP 153/86 | HR 64 | Temp 98.7°F | Resp 20 | Ht 60.0 in | Wt 130.8 lb

## 2019-08-27 DIAGNOSIS — Z79810 Long term (current) use of selective estrogen receptor modulators (SERMs): Secondary | ICD-10-CM | POA: Diagnosis not present

## 2019-08-27 DIAGNOSIS — Z170 Estrogen receptor positive status [ER+]: Secondary | ICD-10-CM | POA: Insufficient documentation

## 2019-08-27 DIAGNOSIS — Z923 Personal history of irradiation: Secondary | ICD-10-CM | POA: Insufficient documentation

## 2019-08-27 DIAGNOSIS — C50211 Malignant neoplasm of upper-inner quadrant of right female breast: Secondary | ICD-10-CM | POA: Insufficient documentation

## 2019-08-27 DIAGNOSIS — Z79899 Other long term (current) drug therapy: Secondary | ICD-10-CM | POA: Insufficient documentation

## 2019-08-27 DIAGNOSIS — Z08 Encounter for follow-up examination after completed treatment for malignant neoplasm: Secondary | ICD-10-CM | POA: Diagnosis not present

## 2019-11-09 DIAGNOSIS — C50211 Malignant neoplasm of upper-inner quadrant of right female breast: Secondary | ICD-10-CM | POA: Diagnosis not present

## 2019-12-01 DIAGNOSIS — Z961 Presence of intraocular lens: Secondary | ICD-10-CM | POA: Diagnosis not present

## 2019-12-01 DIAGNOSIS — H18519 Endothelial corneal dystrophy, unspecified eye: Secondary | ICD-10-CM | POA: Diagnosis not present

## 2019-12-24 ENCOUNTER — Other Ambulatory Visit: Payer: Self-pay

## 2019-12-24 MED ORDER — TAMOXIFEN CITRATE 20 MG PO TABS: 20.0000 mg | ORAL_TABLET | Freq: Every day | ORAL | 4 refills | Status: DC

## 2019-12-25 DIAGNOSIS — H60393 Other infective otitis externa, bilateral: Secondary | ICD-10-CM | POA: Diagnosis not present

## 2019-12-25 DIAGNOSIS — R011 Cardiac murmur, unspecified: Secondary | ICD-10-CM | POA: Diagnosis not present

## 2019-12-25 DIAGNOSIS — H6123 Impacted cerumen, bilateral: Secondary | ICD-10-CM | POA: Diagnosis not present

## 2020-01-01 DIAGNOSIS — Z20822 Contact with and (suspected) exposure to covid-19: Secondary | ICD-10-CM | POA: Diagnosis not present

## 2020-01-01 DIAGNOSIS — Z03818 Encounter for observation for suspected exposure to other biological agents ruled out: Secondary | ICD-10-CM | POA: Diagnosis not present

## 2020-01-18 ENCOUNTER — Ambulatory Visit (INDEPENDENT_AMBULATORY_CARE_PROVIDER_SITE_OTHER): Payer: Medicare Other | Admitting: Sports Medicine

## 2020-01-18 ENCOUNTER — Encounter: Payer: Self-pay | Admitting: Sports Medicine

## 2020-01-18 DIAGNOSIS — H6121 Impacted cerumen, right ear: Secondary | ICD-10-CM | POA: Diagnosis not present

## 2020-01-18 DIAGNOSIS — N898 Other specified noninflammatory disorders of vagina: Secondary | ICD-10-CM

## 2020-01-18 DIAGNOSIS — N952 Postmenopausal atrophic vaginitis: Secondary | ICD-10-CM | POA: Insufficient documentation

## 2020-01-18 MED ORDER — PREMARIN 0.625 MG/GM VA CREA: 1.0000 | TOPICAL_CREAM | Freq: Every day | VAGINAL | 12 refills | Status: DC

## 2020-01-18 NOTE — Patient Instructions (Signed)
Atrophic Vaginitis  Atrophic vaginitis is a condition in which the tissues that line the vagina become dry and thin. This condition is most common in women who have stopped having regular menstrual periods (are in menopause). This usually starts when a woman is 45-71 years old. That is the time when a woman's estrogen levels begin to drop (decrease). Estrogen is a female hormone. It helps to keep the tissues of the vagina moist. It stimulates the vagina to produce a clear fluid that lubricates the vagina for sexual intercourse. This fluid also protects the vagina from infection. Lack of estrogen can cause the lining of the vagina to get thinner and dryer. The vagina may also shrink in size. It may become less elastic. Atrophic vaginitis tends to get worse over time as a woman's estrogen level drops. What are the causes? This condition is caused by the normal drop in estrogen that happens around the time of menopause. What increases the risk? Certain conditions or situations may lower a woman's estrogen level, leading to a higher risk for atrophic vaginitis. You are more likely to develop this condition if:  You are taking medicines that block estrogen.  You have had your ovaries removed.  You are being treated for cancer with X-ray (radiation) or medicines (chemotherapy).  You have given birth or are breastfeeding.  You are older than age 50.  You smoke. What are the signs or symptoms? Symptoms of this condition include:  Pain, soreness, or bleeding during sexual intercourse (dyspareunia).  Vaginal burning, irritation, or itching.  Pain or bleeding when a speculum is used in a vaginal exam (pelvic exam).  Having burning pain when passing urine.  Vaginal discharge that is brown or yellow. In some cases, there are no symptoms. How is this diagnosed? This condition is diagnosed by taking a medical history and doing a physical exam. This will include a pelvic exam that checks the  vaginal tissues. Though rare, you may also have other tests, including:  A urine test.  A test that checks the acid balance in your vagina (acid balance test). How is this treated? Treatment for this condition depends on how severe your symptoms are. Treatment may include:  Using an over-the-counter vaginal lubricant before sex.  Using a long-acting vaginal moisturizer.  Using low-dose vaginal estrogen for moderate to severe symptoms that do not respond to other treatments. Options include creams, tablets, and inserts (vaginal rings). Before you use a vaginal estrogen, tell your health care provider if you have a history of: ? Breast cancer. ? Endometrial cancer. ? Blood clots. If you are not sexually active and your symptoms are very mild, you may not need treatment. Follow these instructions at home: Medicines  Take over-the-counter and prescription medicines only as told by your health care provider. Do not use herbal or alternative medicines unless your health care provider says that you can.  Use over-the-counter creams, lubricants, or moisturizers for dryness only as directed by your health care provider. General instructions  If your atrophic vaginitis is caused by menopause, discuss all of your menopause symptoms and treatment options with your health care provider.  Do not douche.  Do not use products that can make your vagina dry. These include: ? Scented feminine sprays. ? Scented tampons. ? Scented soaps.  Vaginal intercourse can help to improve blood flow and elasticity of vaginal tissue. If it hurts to have sex, try using a lubricant or moisturizer just before having intercourse. Contact a health care provider if:    Your discharge looks different than normal.  Your vagina has an unusual smell.  You have new symptoms.  Your symptoms do not improve with treatment.  Your symptoms get worse. Summary  Atrophic vaginitis is a condition in which the tissues that  line the vagina become dry and thin. It is most common in women who have stopped having regular menstrual periods (are in menopause).  Treatment options include using vaginal lubricants and low-dose vaginal estrogen.  Contact a health care provider if your vagina has an unusual smell, or if your symptoms get worse or do not improve after treatment. This information is not intended to replace advice given to you by your health care provider. Make sure you discuss any questions you have with your health care provider. Document Revised: 03/15/2017 Document Reviewed: 12/27/2016 Elsevier Patient Education  2020 Elsevier Inc.  

## 2020-01-18 NOTE — Progress Notes (Signed)
    Procedures performed today:    None.  Independent interpretation of notes and tests performed by another provider:   None.  Brief History, Exam, Impression, and Recommendations:    Joan Lopez is a pleasant 69yo female who comes in for complaints of a heart murmur, ear impaction, and vaginal odor. [facility]. At this visit they noticed a 1/6 murmur. On exam today we heard no murmur. She is not having any excessive fatigue or dizziness or SOB. She does run frequently with no issues.  They also were unable to disimpact the right ear. We tried manual disimpaction with no luck. We succesfully disimpacted the ear with irrigation.  She reports that her urine has been smelling different the last three months. She has no burning or pain with urination. She does reports some pain when wiping that occurs on her labia. 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 We are going to giver her vaginal estrogen cream to help with symtpoms.  She can follow up as needed.   Marcelino Duster, MS3   ___________________________________________ Gwen Her. Dianah Field, M.D., ABFM., CAQSM. Primary Care and [place] Instructor of [place] of [facility] of [facility]

## 2020-01-18 NOTE — Assessment & Plan Note (Signed)
Unable to remove wax with instrumentation, will try irrigation.

## 2020-01-18 NOTE — Assessment & Plan Note (Signed)
Simple irritation around the labia minora per her report, no urinary urgency, frequency, burning. 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

## 2020-02-15 ENCOUNTER — Ambulatory Visit: Payer: Medicare Other | Admitting: Sports Medicine

## 2020-02-24 ENCOUNTER — Ambulatory Visit (INDEPENDENT_AMBULATORY_CARE_PROVIDER_SITE_OTHER): Payer: Medicare Other | Admitting: Sports Medicine

## 2020-02-24 DIAGNOSIS — N898 Other specified noninflammatory disorders of vagina: Secondary | ICD-10-CM | POA: Diagnosis not present

## 2020-02-24 NOTE — Progress Notes (Signed)
    Procedures performed today:    None.  Independent interpretation of notes and tests performed by another provider:   None.  Brief History, Exam, Impression, and Recommendations:    Vaginal irritation This pleasant 69 year old female status post surgical menopause and currently on tamoxifen for breast cancer had reported some vaginal labia minora irritation, she had no urgency, frequency, burning. 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 She had a fantastic improvement in symptoms, and desires to stop the cream, she understands that her symptoms may return, and if they do she can certainly restart her Premarin. If insufficient improvement we would likely refer her to gynecology at that point. She did discuss some mild depressive symptoms without suicidal or homicidal ideation, I offered treatment, and she is going to think about it. Return as needed.    ___________________________________________ Gwen Her. Dianah Field, M.D., ABFM., CAQSM. Primary Care and [place] Instructor of [place] of [facility] of Medicine

## 2020-02-24 NOTE — Assessment & Plan Note (Signed)
This pleasant 69 year old female status post surgical menopause and currently on tamoxifen for breast cancer had reported some vaginal labia minora irritation, she had no urgency, frequency, burning. 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 She had a fantastic improvement in symptoms, and desires to stop the cream, she understands that her symptoms may return, and if they do she can certainly restart her Premarin. If insufficient improvement we would likely refer her to gynecology at that point. She did discuss some mild depressive symptoms without suicidal or homicidal ideation, I offered treatment, and she is going to think about it. Return as needed.

## 2020-05-02 IMAGING — MG DIGITAL SCREENING BILATERAL MAMMOGRAM WITH TOMO AND CAD
6 of 10 series · 6 of 30 positions shown · non-contrast
Comparison: Previous exam(s).

CLINICAL DATA: Screening.

EXAM:
DIGITAL SCREENING BILATERAL MAMMOGRAM WITH TOMO AND CAD

[R CC synth-2D]
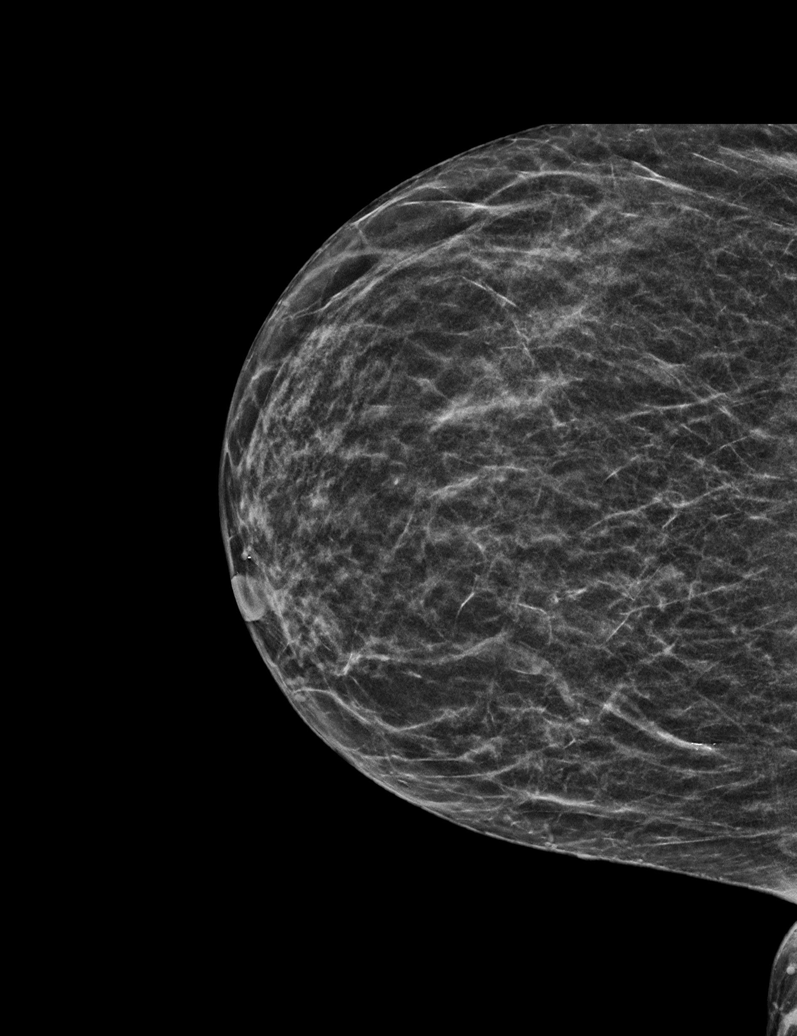

[R MLO synth-2D (1 of 2)]
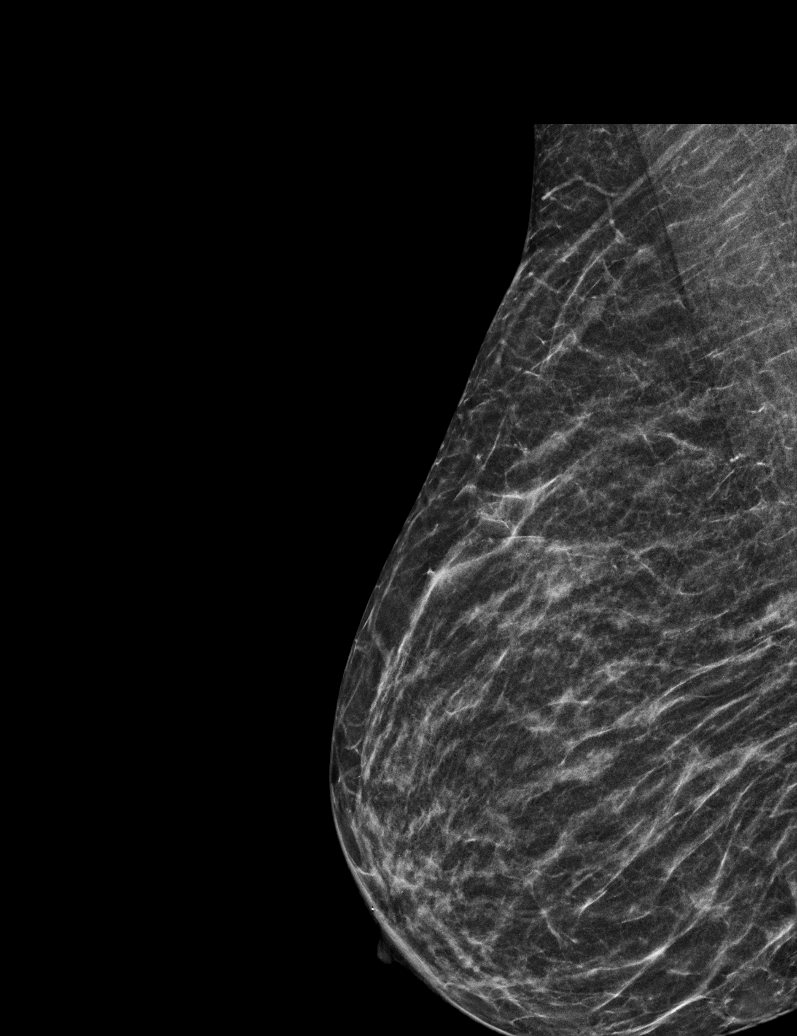

[R MLO synth-2D (2 of 2)]
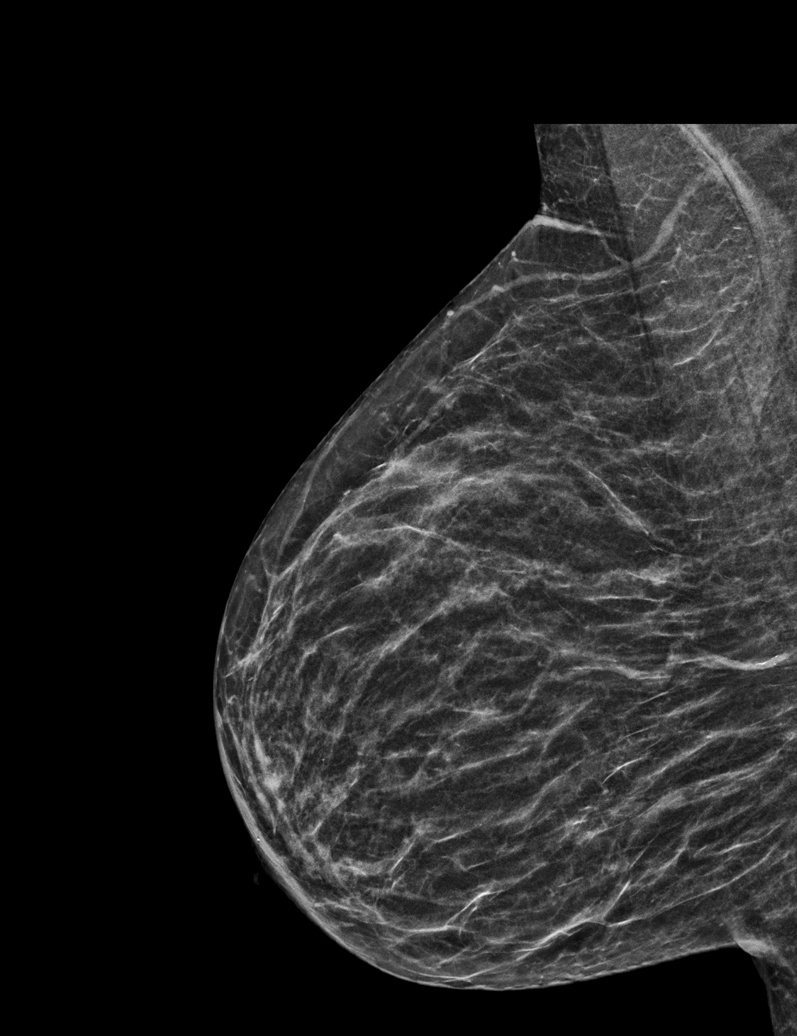

[L MLO synth-2D]
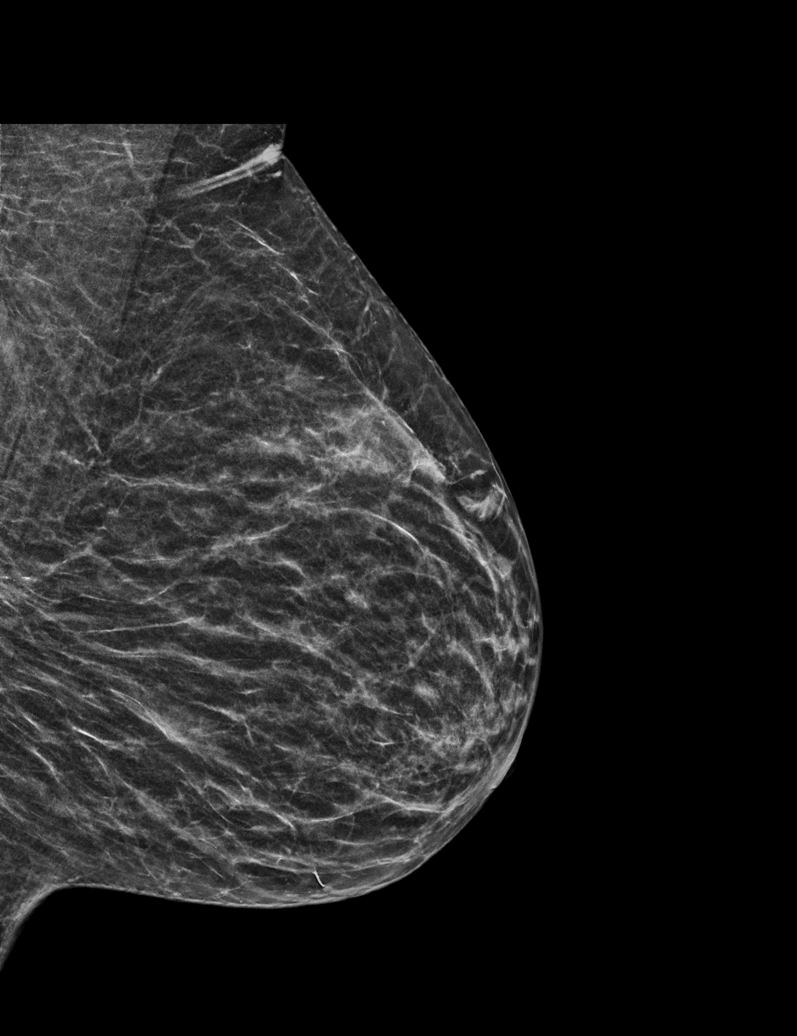

[L CC synth-2D]
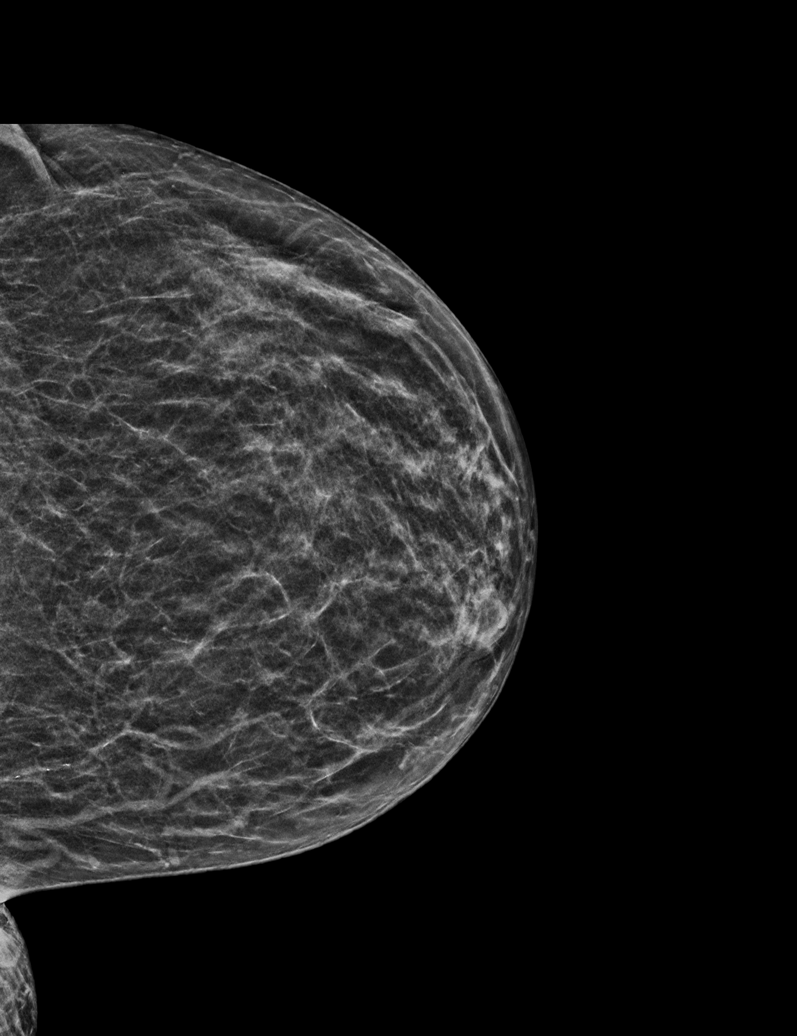

[R CC tomo · tomo slice 19/37.0]
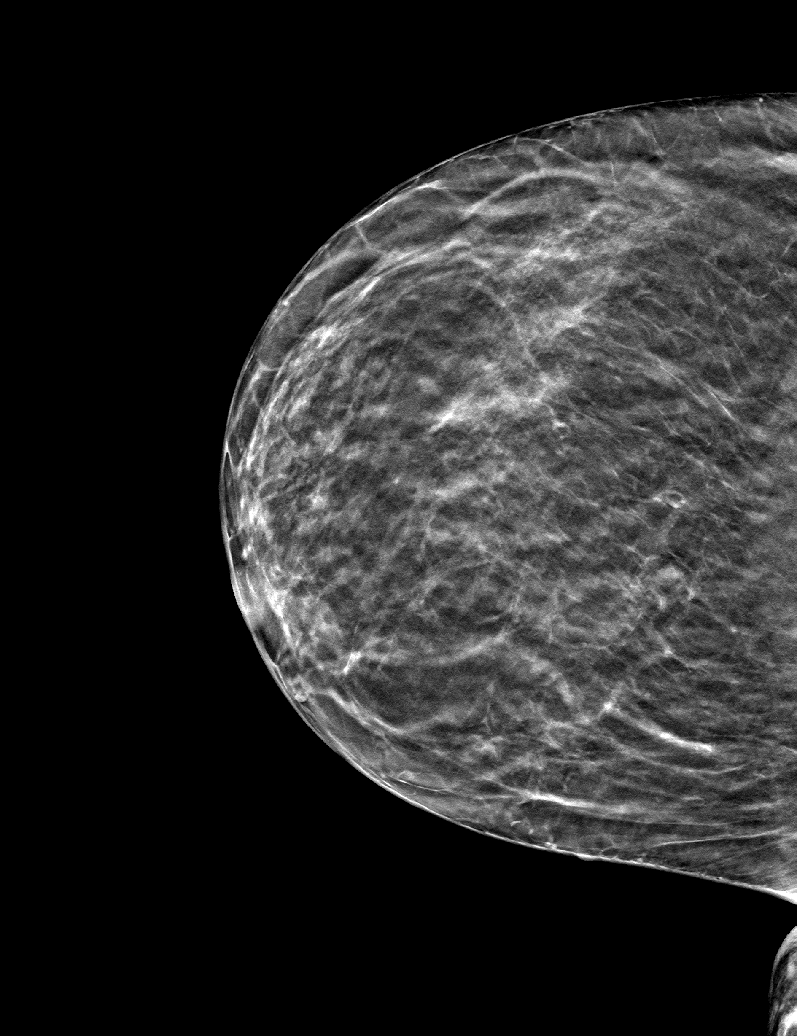

[6 of 30 positions shown; findings below may reference images not displayed]

ACR Breast Density Category b: There are scattered areas of
fibroglandular density.
FINDINGS: There are no findings suspicious for malignancy. Images were
processed with CAD.
IMPRESSION: No mammographic evidence of malignancy. A result letter of this
screening mammogram will be mailed directly to the patient.

RECOMMENDATION:
Screening mammogram in one year. (Code:[id number])

BI-RADS CATEGORY  1: Negative.

## 2020-05-11 ENCOUNTER — Inpatient Hospital Stay: Payer: Medicare Other

## 2020-05-11 ENCOUNTER — Other Ambulatory Visit: Payer: Self-pay

## 2020-05-11 ENCOUNTER — Inpatient Hospital Stay: Payer: Medicare Other | Attending: Oncology | Admitting: Oncology

## 2020-05-11 VITALS — BP 151/89 | HR 88 | Temp 97.9°F | Resp 18 | Ht 65.0 in | Wt 132.6 lb

## 2020-05-11 DIAGNOSIS — M818 Other osteoporosis without current pathological fracture: Secondary | ICD-10-CM

## 2020-05-11 DIAGNOSIS — C50211 Malignant neoplasm of upper-inner quadrant of right female breast: Secondary | ICD-10-CM | POA: Diagnosis not present

## 2020-05-11 DIAGNOSIS — Z79810 Long term (current) use of selective estrogen receptor modulators (SERMs): Secondary | ICD-10-CM | POA: Insufficient documentation

## 2020-05-11 DIAGNOSIS — Z170 Estrogen receptor positive status [ER+]: Secondary | ICD-10-CM

## 2020-05-11 DIAGNOSIS — G500 Trigeminal neuralgia: Secondary | ICD-10-CM

## 2020-05-11 DIAGNOSIS — C50411 Malignant neoplasm of upper-outer quadrant of right female breast: Secondary | ICD-10-CM | POA: Insufficient documentation

## 2020-05-11 DIAGNOSIS — D696 Thrombocytopenia, unspecified: Secondary | ICD-10-CM | POA: Diagnosis not present

## 2020-05-11 DIAGNOSIS — Z79899 Other long term (current) drug therapy: Secondary | ICD-10-CM | POA: Diagnosis not present

## 2020-05-11 DIAGNOSIS — Z923 Personal history of irradiation: Secondary | ICD-10-CM | POA: Diagnosis not present

## 2020-05-11 LAB — COMPREHENSIVE METABOLIC PANEL
ALT: 11 U/L (ref 0–44)
AST: 21 U/L (ref 15–41)
Albumin: 3.8 g/dL (ref 3.5–5.0)
Alkaline Phosphatase: 36 U/L — ABNORMAL LOW (ref 38–126)
Anion gap: 7 (ref 5–15)
BUN: 20 mg/dL (ref 8–23)
CO2: 30 mmol/L (ref 22–32)
Calcium: 9.1 mg/dL (ref 8.9–10.3)
Chloride: 105 mmol/L (ref 98–111)
Creatinine, Ser: 0.83 mg/dL (ref 0.44–1.00)
GFR, Estimated: >60 mL/min (ref >60)
Glucose, Bld: 85 mg/dL (ref 70–99)
Potassium: 3.6 mmol/L (ref 3.5–5.1)
Sodium: 142 mmol/L (ref 135–145)
Total Bilirubin: 0.7 mg/dL (ref 0.3–1.2)
Total Protein: 7.1 g/dL (ref 6.5–8.1)

## 2020-05-11 LAB — CBC WITH DIFFERENTIAL/PLATELET
Abs Immature Granulocytes: 0.02 10*3/uL (ref 0.00–0.07)
Basophils Absolute: 0 10*3/uL (ref 0.0–0.1)
Basophils Relative: 0 %
Eosinophils Absolute: 0 10*3/uL (ref 0.0–0.5)
Eosinophils Relative: 1 %
HCT: 40.5 % (ref 36.0–46.0)
Hemoglobin: 13.8 g/dL (ref 12.0–15.0)
Immature Granulocytes: 0 %
Lymphocytes Relative: 27 %
Lymphs Abs: 1.3 10*3/uL (ref 0.7–4.0)
MCH: 30.7 pg (ref 26.0–34.0)
MCHC: 34.1 g/dL (ref 30.0–36.0)
MCV: 90.2 fL (ref 80.0–100.0)
Monocytes Absolute: 0.5 10*3/uL (ref 0.1–1.0)
Monocytes Relative: 10 %
Neutro Abs: 3.1 10*3/uL (ref 1.7–7.7)
Neutrophils Relative %: 62 %
Platelets: 146 10*3/uL — ABNORMAL LOW (ref 150–400)
RBC: 4.49 MIL/uL (ref 3.87–5.11)
RDW: 11.9 % (ref 11.5–15.5)
WBC: 5 10*3/uL (ref 4.0–10.5)
nRBC: 0 % (ref 0.0–0.2)

## 2020-05-11 NOTE — Progress Notes (Signed)
[place]  [phone] Fax:(336) [phone]     ID: Joan Lopez DOB: April 07, 1951  MR#: [id number]  UTM#:[id number]  Patient Care Team: Silverio Decamp, MD as PCP - General (Sports Medicine) Garrell Flagg, Virgie Dad, MD as Consulting Physician (Oncology) Bunnie Pion, MD as Consulting Physician (Ophthalmology) Gery Pray, MD as Consulting Physician (Radiation Oncology) Erroll Luna, MD as Consulting Physician (General Surgery) Chauncey Cruel, MD OTHER MD:  CHIEF COMPLAINT: Estrogen receptor positive breast cancer  CURRENT TREATMENT: Tamoxifen   INTERVAL HISTORY: Joan Lopez returns today for follow up of her estrogen receptor positive breast cancer.  She was started on tamoxifen on 01/21/2019.  She has had absolutely no side effects related to this.  Specifically she has had no hot flashes and no vaginal wetness problems.  She has developed some vaginal atrophy issues which are not likely related to tamoxifen: Tamoxifen does not diminish estrogen production.  She has been started on estradiol vaginal cream by her primary physician.  This is quite safe so long she continues on tamoxifen.  It seems to be working for her.  Since her last visit, she underwent bilateral diagnostic mammography with tomography at [facility] on 07/28/2019 showing: breast density category B; no evidence of malignancy in either breast.    REVIEW OF SYSTEMS: [name] has not ran a marathon in the last 18 months but she is planning to run one in [place] later this year.  [place] she [place].  Her knees are holding up.  She has gained a little weight.  Aside from that a detailed review of systems today was stable   COVID 19 VACCINATION STATUS: [place] x2, most recently 05/2019, no booster as of 05/11/2020   HISTORY OF CURRENT ILLNESS: From the original intake note:  Joan Lopez had routine screening mammography on 09/19/2018  showing a possible abnormality in the right breast. She underwent bilateral diagnostic mammography with tomography and right breast ultrasonography at [facility] on 09/25/2018 showing: breast density category B; suspicious right breast mass in the 1 o'clock position 9 cm from the nipple measuring 6 mm; indeterminate right breast mass in the 12 o'clock position 6 cm from the nipple measuring 9 mm; no suspicious right axillary lymphadenopathy.  Accordingly on 09/26/2018 she proceeded to biopsy of the right breast areas in question. 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 Prognostic indicators significant for: estrogen receptor, 100% positive and progesterone receptor, 10% positive, both with strong staining intensity. Proliferation marker Ki67 at 5%. HER2 equivocal by immunohistochemistry (2+), but negative by fluorescent in situ hybridization with a signals ratio 1.16 and number per cell 1.80.  Biopsy of the right breast 12 o'clock location from the same reports showed fibroadenoma, sclerotic, with no malignancy identified.  The patient's subsequent history is as detailed below.   PAST MEDICAL HISTORY: Past Medical History:  Diagnosis Date  . Abnormal LFTs 10/29/2012  . Acquired bilateral renal cysts   . Arthritis   . Bilateral bunions   . Constipation 10/16/2011  . DDD (degenerative disc disease)    spine w narrowing  . Dehydration, mild 12/26/2011  . Diverticulitis   . Exercise counseling 11/14/2011  . Family history of brain cancer   . Family history of breast cancer   . Family history of cancer   . Family history of colon cancer   . Family history of  kidney cancer   . Family history of oral cancer   . Family history of stomach cancer   . Fuch's endothelial dystrophy   . Guttate psoriasis   . Hematuria 08/24/2011  . Hemorrhoids   . History of chicken pox   . Hyperlipidemia    . Kidney stones   . Lipoma of arm 07/13/2012  . Lipoma of arm 10/07/2012   Left   . Mass of arm 07/13/2012   Has had them in past    . Personal history of radiation therapy   . 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 Post-menopausal bleeding 10/08/2011  . Reflux 04/17/2012  . Renal insufficiency 01/11/2013  . Thrombocytopenia ([place]) 09/27/2011    PAST SURGICAL HISTORY: Past Surgical History:  Procedure Laterality Date  . APPENDECTOMY  1986  . BREAST LUMPECTOMY Right 10/2018  . BREAST LUMPECTOMY WITH RADIOACTIVE SEED AND SENTINEL LYMPH NODE BIOPSY Right 10/24/2018   Procedure: RIGHT BREAST LUMPECTOMY WITH RADIOACTIVE SEED AND RIGHT AXILLARY SENTINEL LYMPH NODE BIOPSY, RIGHT BLUE DYE INJECTION;  Surgeon: Fanny Skates, MD;  Location: [facility];  Service: General;  Laterality: Right;  PEC BLOCK  . CESAREAN SECTION    . CHOLECYSTECTOMY    . COLONOSCOPY  2005   (records here)[place] - diverticulosis and hemorrhoids  . CORNEAL TRANSPLANT  2013  . CYSTOSCOPY WITH RETROGRADE PYELOGRAM, URETEROSCOPY AND STENT PLACEMENT Right 10/22/2013   Procedure: CYSTOSCOPY WITH RETROGRADE PYELOGRAM, URETEROSCOPY ;  Surgeon: Raynelle Bring, MD;  Location: [facility];  Service: Urology;  Laterality: Right;  . LIPOMA EXCISION  2014   x 3, both arms  . TONSILLECTOMY     possible adenoids  . TOTAL ABDOMINAL HYSTERECTOMY  1991   for fibroids  . TUBAL LIGATION    . WRIST FRACTURE SURGERY     right, titanium plate and pins    FAMILY HISTORY: Family History  Problem Relation Age of Onset  . Stroke Mother   . Heart disease Mother   . Hypertension Mother   . Kidney disease Father   . Breast cancer Sister 35  . Cancer Sister 32       ductal carcinoma breast- had it in 1999 and just found out its in the opposite breast  . Brain cancer Maternal Grandmother 55       brain tumor  . Breast cancer Paternal Grandmother 67       post menopausal   . Pneumonia Maternal Grandfather   . Colon cancer Maternal  Aunt 85       40s or 64s  . Cancer Paternal Uncle 64       neck tumor  . Stomach cancer Maternal Aunt 80       80s  . Kidney cancer Cousin 55       30s, maternal first cousin  . Cancer Paternal Aunt        blood cancer, unknown but older age at diagnosis  . Cancer Maternal Aunt 70       unknown, but not breast, 65s  . Cancer Cousin        oral cancer diagnosed older, maternal first cousin  . Esophageal cancer Neg Hx   . Pancreatic cancer Neg Hx   . Rectal cancer Neg Hx   Patient's father was 33 years old when he died from kidney disease. Patient's mother died from old age at age 25, possibly something related to her colon.  The patient has 1 sister, no brothers.  Patient has a family history  of breast cancer in her sister, diagnosed in her 63s with recurrence more than 5 years later, and in her paternal grandmother. Both were diagnosed following menopause. Her sister underwent bilateral mastectomies. The patient denies a family hx of ovarian, prostate, or pancreatic cancer.  She also notes: colon cancer in a maternal aunt at age 90; stomach cancer in another maternal aunt; a neck tumor in a paternal uncle; a brain tumor in her maternal grandfather.   GYNECOLOGIC HISTORY:  No LMP recorded. Patient has had a hysterectomy. Menarche: 70 years old Age at first live birth: 70 years old [place] P 1 LMP 1991 Contraceptive: IUD, remote HRT estrogen approximately one month Hysterectomy? Yes, 1991 BSO? 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 Husband Joan Lopez") is a retired Engineer, structural. He has significant health issues.  At home it is just the 2 of them.  Daughter Joan Lopez is a Associate Professor at [employer] and has two daughters, Harmon Pier and Chrys Racer (the patient's granddaughters). Chrys Racer is very interested in medicine.  The patient is not a church attender    ADVANCED DIRECTIVES: Currently, husband Joan Lopez is automaticcaly her HCPOA.     HEALTH MAINTENANCE: Social History   Tobacco Use  . Smoking status: Never Smoker  . Smokeless tobacco: Never Used  Substance Use Topics  . Alcohol use: No  . Drug use: No     Colonoscopy: 09/2013, Dr. Carlean Purl, repeat 2025  PAP: 09/2011, normal  Bone density: 05/2014, -2.2   Allergies  Allergen Reactions  . Augmentin [Amoxicillin-Pot Clavulanate]     Severe yeast infection-would prefer not to take.    Current Outpatient Medications  Medication Sig Dispense Refill  . calcium carbonate (OSCAL) 1500 (600 Ca) MG TABS tablet Take 1 tablet (1,500 mg total) by mouth 2 (two) times daily with a meal.    . conjugated estrogens (PREMARIN) vaginal cream Place 1 Applicatorful vaginally daily. 42.5 g 12  . gabapentin (NEURONTIN) 300 MG capsule One tab PO qHS for a week, then BID for a week, then TID. May double weekly to a max of 3,654m/day 90 capsule 3  . loteprednol (LOTEMAX) 0.5 % ophthalmic suspension Place 1 drop into both eyes as directed. Reported on 06/07/2015    . tamoxifen (NOLVADEX) 20 MG tablet Take 1 tablet (20 mg total) by mouth daily. 90 tablet 4   No current facility-administered medications for this visit.    OBJECTIVE: white woman who appears younger than stated age  V44   05/11/20 1518  BP: (!) 151/89  Pulse: 88  Resp: 18  Temp: 97.9 F (36.6 C)  SpO2: 99%     Body mass index is 22.07 kg/m.   Wt Readings from Last 3 Encounters:  05/11/20 132 lb 9.6 oz (60.1 kg)  01/18/20 130 lb 1.9 oz (59 kg)  08/27/19 130 lb 12.8 oz (59.3 kg)      ECOG FS:1 - Symptomatic but completely ambulatory  Sclerae unicteric, EOMs intact Wearing a mask No cervical or supraclavicular adenopathy Lungs no rales or rhonchi Heart regular rate and rhythm Abd soft, nontender, positive bowel sounds MSK no focal spinal tenderness, no upper extremity lymphedema Neuro: nonfocal, well oriented, appropriate affect Breasts: The right breast has undergone lumpectomy and radiation.   There is no evidence of local recurrence.  Left breast and both axillae are benign   LAB RESULTS:  CMP     Component Value Date/Time   NA 142 05/11/2020  1500   K 3.6 05/11/2020 1500   CL 105 05/11/2020 1500   CO2 30 05/11/2020 1500   GLUCOSE 85 05/11/2020 1500   BUN 20 05/11/2020 1500   CREATININE 0.83 05/11/2020 1500   CREATININE 0.76 10/11/2017 1007   CALCIUM 9.1 05/11/2020 1500   PROT 7.1 05/11/2020 1500   ALBUMIN 3.8 05/11/2020 1500   AST 21 05/11/2020 1500   ALT 11 05/11/2020 1500   ALKPHOS 36 (L) 05/11/2020 1500   BILITOT 0.7 05/11/2020 1500   GFRNONAA >60 05/11/2020 1500   GFRNONAA >89 04/02/2011 1037   GFRAA >60 05/11/2019 1448   GFRAA >89 04/02/2011 1037    Lab Results  Component Value Date   WBC 5.0 05/11/2020   NEUTROABS 3.1 05/11/2020   HGB 13.8 05/11/2020   HCT 40.5 05/11/2020   MCV 90.2 05/11/2020   PLT 146 (L) 05/11/2020   No results found for: LABCA2  No components found for: UVOZDG644  No results for input(s): INR in the last 168 hours.  No results found for: LABCA2  No results found for: IHK742  No results found for: VZD638  No results found for: VFI433  No results found for: CA2729  No components found for: HGQUANT  No results found for: CEA1 / No results found for: CEA1   No results found for: AFPTUMOR  No results found for: CHROMOGRNA  No results found for: TOTALPROTELP, ALBUMINELP, A1GS, A2GS, BETS, BETA2SER, GAMS, MSPIKE, SPEI (this displays SPEP labs)  No results found for: KPAFRELGTCHN, LAMBDASER, KAPLAMBRATIO (kappa/lambda light chains)  No results found for: HGBA, HGBA2QUANT, HGBFQUANT, HGBSQUAN (Hemoglobinopathy evaluation)   No results found for: LDH  No results found for: IRON, TIBC, IRONPCTSAT (Iron and TIBC)  No results found for: FERRITIN  Urinalysis    Component Value Date/Time   COLORURINE YELLOW 10/07/2012 1331   APPEARANCEUR CLEAR 10/07/2012 1331   LABSPEC 1.020 10/07/2012 1331   PHURINE 6.0  10/07/2012 1331   GLUCOSEU NEG 10/07/2012 1331   GLUCOSEU NEG mg/dL 04/02/2007 2006   HGBUR NEG 10/07/2012 1331   HGBUR large 07/25/2009 1427   BILIRUBINUR neg 03/01/2015 1201   KETONESUR NEG 10/07/2012 1331   PROTEINUR neg 03/01/2015 1201   PROTEINUR NEG 10/07/2012 1331   UROBILINOGEN 0.2 03/01/2015 1201   UROBILINOGEN 0.2 10/07/2012 1331   NITRITE neg 03/01/2015 1201   NITRITE NEG 10/07/2012 1331   LEUKOCYTESUR Negative 03/01/2015 1201    STUDIES: No results found.   ELIGIBLE FOR AVAILABLE RESEARCH PROTOCOL: no  ASSESSMENT: 70 y.o. [place], [place] woman status post right breast upper outer quadrant biopsy 09/26/2018 for a clinical T1BN0, stage Ia invasive ductal carcinoma, grade 1, estrogen and progesterone receptor positive, HER-2 negative, with an MIB-1 of 5%  (1) status post right lumpectomy 10/24/2018 for a pT1b pN0, stage IA invasive ductal carcinoma, grade 1, with negative margins  (a) a total of 3 right axillary sentinel lymph nodes removed  (2) no chemotherapy planned, no Oncotype needed  (3) adjuvant radiation 12/01/2018 - 12/29/2018  (a) Right Breast / 40.05 Gy in 15 fractions  (b) Right Breast Boost / 10 Gy in 5 fractions  (4) tamoxifen started October 2020  (a) bone density 05/19/2014 showed a T score of -2.2  (b) status post remote hysterectomy  (c) repeat bone density 03/05/2019 showed a T score of -2.5  (5) genetics testing 11/06/2018 through the [facility] Multi-Cancer panel.found no deleterious mutations in AIP, ALK, APC, ATM, AXIN2,BAP1,  BARD1, BLM, BMPR1A, BRCA1, BRCA2, BRIP1, CASR, CDC73, CDH1, CDK4,  CDKN1B, CDKN1C, CDKN2A (p14ARF), CDKN2A (p16INK4a), CEBPA, CHEK2, CTNNA1, DICER1, DIS3L2, EGFR (c.2369C>T, p.Thr790Met variant only), EPCAM (Deletion/duplication testing only), FH, FLCN, GATA2, GPC3, GREM1 (Promoter region deletion/duplication testing only), HOXB13 (c.251G>A, p.Gly84Glu), HRAS, KIT, MAX, MEN1, MET, MITF (c.952G>A, p.Glu318Lys variant only), MLH1,  MSH2, MSH3, MSH6, MUTYH, NBN, NF1, NF2, NTHL1, PALB2, PDGFRA, PHOX2B, PMS2, POLD1, POLE, POT1, PRKAR1A, PTCH1, PTEN, RAD50, RAD51C, RAD51D, RB1, RECQL4, RET, RNF43, RUNX1, SDHAF2, SDHA (sequence changes only), SDHB, SDHC, SDHD, SMAD4, SMARCA4, SMARCB1, SMARCE1, STK11, SUFU, TERC, TERT, TMEM127, TP53, TSC1, TSC2, VHL, WRN and WT1.     PLAN: Adelayde is now a year and a half out from definitive surgery for breast cancer with no evidence of disease recurrence.  This is very favorable.  She is tolerating tamoxifen well.  We again reviewed the fact that tamoxifen does not "take away patient's estrogen".  It blocks the estrogen receptor and breast cells but the patient continues to make as much estrogen as before.  For that reason we considered it safe for patients to use vaginal estrogen while on tamoxifen.  Some estrogen will be absorbed through the vaginal wall but this is of no consequence as that cannot affect breast cells which are the real issue here.  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  Normally she would have seen him in 6 months.  She would prefer to see [name] again in 6 months from now rather than establish herself with a different surgeon.  I was glad to operationalize that.  She knows to call for any other issue that may develop before the next visit  Total encounter time 25 minutes.Chauncey Cruel, MD   05/11/2020 5:17 PM Medical Oncology and Hematology [facility] [place], [place] [address] Tel. [phone]    Fax. [phone]   This document serves as a record of services personally performed by Lurline Del, MD. It was created on his behalf by Wilburn Mylar, a trained medical scribe. The creation of this record is based on the scribe's personal observations and the provider's statements to them.   I, Lurline Del MD, have reviewed the above documentation for accuracy and completeness, and I agree with the above.   *Total Encounter Time as  defined by the Centers for Medicare and Medicaid Services includes, in addition to the face-to-face time of a patient visit (documented in the note above) non-face-to-face time: obtaining and reviewing outside history, ordering and reviewing medications, tests or procedures, care coordination (communications with other health care professionals or caregivers) and documentation in the medical record.

## 2020-05-13 ENCOUNTER — Telehealth: Payer: Self-pay | Admitting: Oncology

## 2020-05-13 NOTE — Telephone Encounter (Signed)
Scheduled follow-up appointment per 1/26 los. Patient is aware. 

## 2020-06-06 ENCOUNTER — Telehealth: Payer: Self-pay

## 2020-06-06 NOTE — Telephone Encounter (Signed)
Returned call to pt. Informed pt that per Dr. Jana Hakim it is ok for pt to get COVID booster in right arm. Educated pt that she may experience some lymph node edema as a result but not to be alarmed. Pt verbalized understanding.

## 2020-06-11 DIAGNOSIS — Z23 Encounter for immunization: Secondary | ICD-10-CM | POA: Diagnosis not present

## 2020-07-06 ENCOUNTER — Other Ambulatory Visit: Payer: Self-pay | Admitting: Oncology

## 2020-07-06 DIAGNOSIS — Z1231 Encounter for screening mammogram for malignant neoplasm of breast: Secondary | ICD-10-CM

## 2020-07-06 DIAGNOSIS — Z9889 Other specified postprocedural states: Secondary | ICD-10-CM

## 2020-07-06 DIAGNOSIS — Z853 Personal history of malignant neoplasm of breast: Secondary | ICD-10-CM

## 2020-08-12 ENCOUNTER — Other Ambulatory Visit: Payer: Self-pay | Admitting: Oncology

## 2020-08-15 ENCOUNTER — Other Ambulatory Visit: Payer: Self-pay

## 2020-08-15 ENCOUNTER — Ambulatory Visit
Admission: RE | Admit: 2020-08-15 | Discharge: 2020-08-15 | Disposition: A | Discharge status: Home / Self Care | Payer: Medicare Other | Source: Ambulatory Visit | Attending: Oncology | Admitting: Oncology

## 2020-08-15 DIAGNOSIS — R922 Inconclusive mammogram: Secondary | ICD-10-CM | POA: Diagnosis not present

## 2020-08-15 DIAGNOSIS — Z1231 Encounter for screening mammogram for malignant neoplasm of breast: Secondary | ICD-10-CM

## 2020-08-15 DIAGNOSIS — Z853 Personal history of malignant neoplasm of breast: Secondary | ICD-10-CM

## 2020-08-15 DIAGNOSIS — Z9889 Other specified postprocedural states: Secondary | ICD-10-CM

## 2020-08-30 DIAGNOSIS — H18513 Endothelial corneal dystrophy, bilateral: Secondary | ICD-10-CM | POA: Diagnosis not present

## 2020-08-30 DIAGNOSIS — Z947 Corneal transplant status: Secondary | ICD-10-CM | POA: Insufficient documentation

## 2020-08-30 DIAGNOSIS — Z961 Presence of intraocular lens: Secondary | ICD-10-CM | POA: Diagnosis not present

## 2020-09-14 DIAGNOSIS — Z20822 Contact with and (suspected) exposure to covid-19: Secondary | ICD-10-CM | POA: Diagnosis not present

## 2020-11-18 ENCOUNTER — Encounter: Payer: Self-pay | Admitting: Oncology

## 2020-12-22 DIAGNOSIS — Z23 Encounter for immunization: Secondary | ICD-10-CM | POA: Diagnosis not present

## 2021-01-16 ENCOUNTER — Telehealth: Payer: Self-pay | Admitting: Oncology

## 2021-01-16 NOTE — Telephone Encounter (Signed)
Scheduled appointment per 10/01 sch msg. Patient is aware.  

## 2021-01-17 ENCOUNTER — Ambulatory Visit (INDEPENDENT_AMBULATORY_CARE_PROVIDER_SITE_OTHER): Payer: Medicare Other | Admitting: Sports Medicine

## 2021-01-17 ENCOUNTER — Ambulatory Visit (INDEPENDENT_AMBULATORY_CARE_PROVIDER_SITE_OTHER): Payer: Medicare Other

## 2021-01-17 ENCOUNTER — Other Ambulatory Visit: Payer: Self-pay

## 2021-01-17 ENCOUNTER — Other Ambulatory Visit: Payer: Medicare Other

## 2021-01-17 ENCOUNTER — Other Ambulatory Visit: Payer: Self-pay | Admitting: Sports Medicine

## 2021-01-17 ENCOUNTER — Ambulatory Visit: Payer: Medicare Other | Admitting: Oncology

## 2021-01-17 DIAGNOSIS — M25552 Pain in left hip: Secondary | ICD-10-CM | POA: Diagnosis not present

## 2021-01-17 DIAGNOSIS — M47817 Spondylosis without myelopathy or radiculopathy, lumbosacral region: Secondary | ICD-10-CM | POA: Diagnosis not present

## 2021-01-17 DIAGNOSIS — R270 Ataxia, unspecified: Secondary | ICD-10-CM

## 2021-01-17 DIAGNOSIS — G6289 Other specified polyneuropathies: Secondary | ICD-10-CM | POA: Diagnosis not present

## 2021-01-17 DIAGNOSIS — R29898 Other symptoms and signs involving the musculoskeletal system: Secondary | ICD-10-CM

## 2021-01-17 DIAGNOSIS — M48061 Spinal stenosis, lumbar region without neurogenic claudication: Secondary | ICD-10-CM | POA: Diagnosis not present

## 2021-01-17 DIAGNOSIS — R531 Weakness: Secondary | ICD-10-CM | POA: Diagnosis not present

## 2021-01-17 DIAGNOSIS — M25551 Pain in right hip: Secondary | ICD-10-CM

## 2021-01-17 DIAGNOSIS — M4316 Spondylolisthesis, lumbar region: Secondary | ICD-10-CM | POA: Diagnosis not present

## 2021-01-17 DIAGNOSIS — M419 Scoliosis, unspecified: Secondary | ICD-10-CM | POA: Diagnosis not present

## 2021-01-17 DIAGNOSIS — E538 Deficiency of other specified B group vitamins: Secondary | ICD-10-CM

## 2021-01-17 NOTE — Assessment & Plan Note (Signed)
This is a pleasant 70 year old female, she runs marathons, she recalls halfway through marathon developing some weakness in her hips to the point where she felt like she was listing to the side, she also felt like she was forced into a forward flexed posture. She also endorses feeling unsteady over the preceding several months. On exam today she has weakness of her hip abductor's bilaterally. I think she likely is a combination of hip abductor weakness which is fairly important for runners, she also likely has worsening lumbar spinal stenosis. We will start evaluation for metabolic neuropathy, structural neuropathy. Would also like her to do some physical therapy for lumbar stabilization and hip abductor strengthening. We will see her back in approximately 8 weeks. Certainly nerve conduction study and EMG may be necessary in the future.

## 2021-01-17 NOTE — Progress Notes (Signed)
    Procedures performed today:    None.  Independent interpretation of notes and tests performed by another provider:   I did personally review a CT of the abdomen and pelvis from 2013, there was multilevel mild to moderate spinal stenosis.  Brief History, Exam, Impression, and Recommendations:    Weakness of hip This is a pleasant 70 year old female, she runs marathons, she recalls halfway through marathon developing some weakness in her hips to the point where she felt like she was listing to the side, she also felt like she was forced into a forward flexed posture. She also endorses feeling unsteady over the preceding several months. On exam today she has weakness of her hip abductor's bilaterally. I think she likely is a combination of hip abductor weakness which is fairly important for runners, she also likely has worsening lumbar spinal stenosis. We will start evaluation for metabolic neuropathy, structural neuropathy. Would also like her to do some physical therapy for lumbar stabilization and hip abductor strengthening. We will see her back in approximately 8 weeks. Certainly nerve conduction study and EMG may be necessary in the future.    ___________________________________________ Gwen Her. Dianah Field, M.D., ABFM., CAQSM. Primary Care and [place] Instructor of [place] [employer] [facility] of Medicine

## 2021-01-19 ENCOUNTER — Ambulatory Visit (INDEPENDENT_AMBULATORY_CARE_PROVIDER_SITE_OTHER): Payer: Medicare Other | Admitting: Rehabilitative and Restorative Service Providers"

## 2021-01-19 ENCOUNTER — Encounter: Payer: Self-pay | Admitting: Rehabilitative and Restorative Service Providers"

## 2021-01-19 ENCOUNTER — Other Ambulatory Visit: Payer: Self-pay

## 2021-01-19 DIAGNOSIS — R29898 Other symptoms and signs involving the musculoskeletal system: Secondary | ICD-10-CM | POA: Diagnosis not present

## 2021-01-19 DIAGNOSIS — R293 Abnormal posture: Secondary | ICD-10-CM

## 2021-01-19 DIAGNOSIS — R531 Weakness: Secondary | ICD-10-CM

## 2021-01-19 LAB — COMPREHENSIVE METABOLIC PANEL
AG Ratio: 1.4 (calc) (ref 1.0–2.5)
ALT: 13 U/L (ref 6–29)
AST: 22 U/L (ref 10–35)
Albumin: 4.1 g/dL (ref 3.6–5.1)
Alkaline phosphatase (APISO): 34 U/L — ABNORMAL LOW (ref 37–153)
BUN: 17 mg/dL (ref 7–25)
CO2: 30 mmol/L (ref 20–32)
Calcium: 9.7 mg/dL (ref 8.6–10.4)
Chloride: 104 mmol/L (ref 98–110)
Creat: 0.7 mg/dL (ref 0.60–1.00)
Globulin: 3 g/dL (calc) (ref 1.9–3.7)
Glucose, Bld: 92 mg/dL (ref 65–99)
Potassium: 4.1 mmol/L (ref 3.5–5.3)
Sodium: 145 mmol/L (ref 135–146)
Total Bilirubin: 0.6 mg/dL (ref 0.2–1.2)
Total Protein: 7.1 g/dL (ref 6.1–8.1)

## 2021-01-19 LAB — CBC
HCT: 43.2 % (ref 35.0–45.0)
Hemoglobin: 14.6 g/dL (ref 11.7–15.5)
MCH: 30.8 pg (ref 27.0–33.0)
MCHC: 33.8 g/dL (ref 32.0–36.0)
MCV: 91.1 fL (ref 80.0–100.0)
MPV: 11.2 fL (ref 7.5–12.5)
Platelets: 133 10*3/uL — ABNORMAL LOW (ref 140–400)
RBC: 4.74 10*6/uL (ref 3.80–5.10)
RDW: 12.5 % (ref 11.0–15.0)
WBC: 4.9 10*3/uL (ref 3.8–10.8)

## 2021-01-19 LAB — VITAMIN B12: Vitamin B-12: 314 pg/mL (ref 200–1100)

## 2021-01-19 LAB — TSH: TSH: 1.25 mIU/L (ref 0.40–4.50)

## 2021-01-19 LAB — METHYLMALONIC ACID AND HOMOCYSTEINE
Homocysteine: 18.7 umol/L — ABNORMAL HIGH (ref <10.4)
Methylmalonic Acid, Quant: 139 nmol/L (ref 87–318)

## 2021-01-19 NOTE — Therapy (Signed)
[facility] [place], [place], [address] Phone: [phone]   Fax:  [phone]  Physical Therapy Evaluation  Patient Details  Name: Joan Lopez MRN: [id number] Date of Birth: Sep 30, 1950 Referring Provider (PT): Dr Dianah Field   Encounter Date: 01/19/2021   PT End of Session - 01/19/21 1249     Visit Number 1    Number of Visits 12    Date for PT Re-Evaluation 03/16/21    PT Start Time 8768    PT Stop Time 1240    PT Time Calculation (min) 56 min    Activity Tolerance Patient tolerated treatment well             Past Medical History:  Diagnosis Date   Abnormal LFTs 10/29/2012   Acquired bilateral renal cysts    Arthritis    Bilateral bunions    Constipation 10/16/2011   DDD (degenerative disc disease)    spine w narrowing   Dehydration, mild 12/26/2011   Diverticulitis    Exercise counseling 11/14/2011   Family history of brain cancer    Family history of breast cancer    Family history of cancer    Family history of colon cancer    Family history of kidney cancer    Family history of oral cancer    Family history of stomach cancer    Fuch's endothelial dystrophy    Guttate psoriasis    Hematuria 08/24/2011   Hemorrhoids    History of chicken pox    Hyperlipidemia    Kidney stones    Lipoma of arm 07/13/2012   Lipoma of arm 10/07/2012   Left    Mass of arm 07/13/2012   Has had them in past     Personal history of radiation therapy    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   Post-menopausal bleeding 10/08/2011   Reflux 04/17/2012   Renal insufficiency 01/11/2013   Thrombocytopenia ([place]) 09/27/2011    Past Surgical History:  Procedure Laterality Date   APPENDECTOMY  1986   BREAST LUMPECTOMY Right 10/2018   BREAST LUMPECTOMY WITH RADIOACTIVE SEED AND SENTINEL LYMPH NODE BIOPSY Right 10/24/2018   Procedure: RIGHT BREAST LUMPECTOMY WITH RADIOACTIVE SEED AND RIGHT AXILLARY SENTINEL LYMPH NODE  BIOPSY, RIGHT BLUE DYE INJECTION;  Surgeon: Fanny Skates, MD;  Location: [facility];  Service: General;  Laterality: Right;  [place]     COLONOSCOPY  2005   (records here)[place] - diverticulosis and hemorrhoids   CORNEAL TRANSPLANT  2013   CYSTOSCOPY WITH RETROGRADE PYELOGRAM, URETEROSCOPY AND STENT PLACEMENT Right 10/22/2013   Procedure: CYSTOSCOPY WITH RETROGRADE PYELOGRAM, URETEROSCOPY ;  Surgeon: Raynelle Bring, MD;  Location: [facility];  Service: Urology;  Laterality: Right;   LIPOMA EXCISION  2014   x 3, both arms   TONSILLECTOMY     possible adenoids   TOTAL ABDOMINAL HYSTERECTOMY  1991   for fibroids   TUBAL LIGATION     WRIST FRACTURE SURGERY     right, titanium plate and pins    There were no vitals filed for this visit.    Subjective Assessment - 01/19/21 1144     Subjective Patient reports that she wsa running in a marathon 01/08/21 and noted that she had problems about 20-22 K. She noticed she wsa running side ways and her body was tipping to the Rt. She was checked at the medical tent and decided to stop the  race. She rested for ~ 1.5 hours and walking was more normal. When she walked later in the day she again noticed listing to the Rt and had some difficulty standing upright. She ran today ~ 5 miles with no problem durning the running. She noticed that her body was leaning to the Rt after a shower. She also notes muscle soreness in the Lt side to back on an intermittent basis.    Pertinent History Rt wrist fx 2012; appendectomy 1968; C-section 1975; hysterectomy 1991; cataract surgery    Patient Stated Goals run normally without listing to the Rt    Currently in Pain? No/denies    Pain Score 0-No pain    Aggravating Factors  running - lists to Rt    Pain Relieving Factors rest                [facility] PT Assessment - 01/19/21 0001       Assessment   Medical Diagnosis Weakness bilat hips; lumbar stenosis     Referring Provider (PT) Dr Dianah Field    Onset Date/Surgical Date 01/08/21    Hand Dominance Right    Next MD Visit 03/14/21    Prior Therapy here for neck 2/21      Precautions   Precautions None      Restrictions   Weight Bearing Restrictions No      Balance Screen   Has the patient fallen in the past 6 months No    Has the patient had a decrease in activity level because of a fear of falling?  No    Is the patient reluctant to leave their home because of a fear of falling?  No      Home Ecologist residence    Living Arrangements Spouse/significant other      Prior Function   Level of Independence Independent    Vocation Retired    Advertising account planner - retired ~ 20 yrs ago    Leisure running 4 days x 3-6 miles; reading; Educational psychologist; walking 1 day a week x 1.5 hours      Observation/Other Assessments   Focus on Therapeutic Outcomes (FOTO)  54      Sensation   Additional Comments WFLs per pt report      Posture/Postural Control   Posture Comments sits with trunk rounded; decreased lumbar lordosis      AROM   Lumbar Flexion 80% pulling iin bilat LE's    Lumbar Extension 50%    Lumbar - Right Side Bend 80%    Lumbar - Left Side Bend 70% pulling in the Rt LB    Lumbar - Right Rotation 65%    Lumbar - Left Rotation 70%      Strength   Overall Strength Comments poor core strength/stability    Right Hip Flexion 4+/5    Right Hip Extension 4/5    Right Hip ABduction 4/5    Left Hip Flexion 4+/5    Left Hip Extension 4/5    Left Hip ABduction 4-/5      Flexibility   Hamstrings tight Rt > Lt    Quadriceps tight Rt > Lt    ITB WFL's    Piriformis tight Rt > Lt    Quadratus Lumborum tight Lt      Palpation   Spinal mobility hypomobile    SI assessment  slightly higher Lt hemipelvis compared to Rt    Palpation comment muscular tightness Rt > Lt piriformis;  glut med; Lt QL; Lt psoas      Special Tests   Other special  tests (-) SLR bilat      Ambulation/Gait   Gait Comments forward flexed posture; no asymmetry noted                        Objective measurements completed on examination: See above findings.       [facility] Adult PT Treatment/Exercise - 01/19/21 0001       Neuro Re-ed    Neuro Re-ed Details  postural correction      Lumbar Exercises: Stretches   Passive Hamstring Stretch Right;Left;2 reps;30 seconds    Passive Hamstring Stretch Limitations supine with strap    Hip Flexor Stretch Left;2 reps;30 seconds   Thomas position supine   Prone on Elbows Stretch 1 rep;60 seconds    Quad Stretch Right;Left;2 reps;30 seconds    Quad Stretch Limitations prone with strap    Piriformis Stretch Right;2 reps;30 seconds    Piriformis Stretch Limitations supine travell      Lumbar Exercises: Supine   AB Set Limitations 4 part core 10 sec x 5 reps      Lumbar Exercises: Sidelying   Hip Abduction Right;Left;5 reps;3 seconds;5 seconds    Hip Abduction Limitations hips forward; leading with heel                     PT Education - 01/19/21 1243     Education Details HEP; POC    Person(s) Educated Patient    Methods Explanation;Demonstration;Tactile cues;Verbal cues;Handout    Comprehension Verbalized understanding;Returned demonstration;Verbal cues required;Tactile cues required              PT Short Term Goals - 01/19/21 1258       PT SHORT TERM GOAL #1   Title Patient independent with initial HEP    Time 4    Period Weeks    Status New    Target Date 02/16/21      PT SHORT TERM GOAL #2   Title Patient demonstrates improved symmetry in LE flexibility    Time 4    Period Weeks    Status New    Target Date 02/17/21      PT SHORT TERM GOAL #3   Title Patient demonstrate/verbalize understanding of improve posture and alignment with sitting/standing/walking    Time 4    Period Weeks    Status New    Target Date 02/17/21               PT Long  Term Goals - 01/19/21 1300       PT LONG TERM GOAL #1   Title 5/5 strength bilat LE's    Time 8    Period Weeks    Status New    Target Date 03/16/21      PT LONG TERM GOAL #2   Title Improve core strength and stability in lower and upper core with patient to demonstrate impeove upright posture and alignment with posterior shoulder girdle musculature engaged    Baseline -    Time 8    Period Weeks    Target Date 03/16/21      PT LONG TERM GOAL #3   Title Patient reports no episodes of asymmetry with running    Time 8    Period Weeks    Status New    Target Date 03/16/21      PT LONG  TERM GOAL #4   Title Independent inHEP    Baseline -    Time 8    Period Weeks    Status New    Target Date 03/16/21      PT LONG TERM GOAL #5   Title Improve functional limitation score to 80    Time 8    Period Weeks    Status New    Target Date 03/16/21                    Plan - 01/19/21 1250     Clinical Impression Statement Patient presents with c/o weakness and listing to the Rt when running in a marathon 01/08/21. She experienced a similar incident 5/21 with no recurrent problems since that time until 01/08/21. Patient has poor posture with increased thoracic kyphosis, decreased lumbar lordosis; core and bilat hip weakness; asymmetrical muscular tightness through bilat hips (tight Lt psoas/hip flexors, Rt piriformis/glut med); inability to continue running at desired level. Patient will benefit from PT to address deficits identified.    Stability/Clinical Decision Making Stable/Uncomplicated    Clinical Decision Making Low    Rehab Potential Good    PT Frequency 2x / week    PT Duration 8 weeks    PT Treatment/Interventions ADLs/Self Care Home Management;Aquatic Therapy;Cryotherapy;Electrical Stimulation;Iontophoresis 4mg /ml Dexamethasone;Moist Heat;Ultrasound;Gait training;Stair training;Functional mobility training;Therapeutic activities;Therapeutic exercise;Balance  training;Neuromuscular re-education;Patient/family education;Manual techniques;Passive range of motion;Dry needling;Taping    PT Next Visit Plan review HEP; progress with exercises (add doorway stretch, posterior shoulder girdle/core TB exercises, antirotation); manual work v DN Rt posterior hip/Lt psoas; myofacial ball release work; modalities as indicated    PT Home Exercise Plan [phone]    Consulted and Agree with Plan of Care Patient             Patient will benefit from skilled therapeutic intervention in order to improve the following deficits and impairments:  Decreased range of motion, Decreased activity tolerance, Hypomobility, Impaired flexibility, Improper body mechanics, Decreased mobility, Decreased strength, Postural dysfunction  Visit Diagnosis: Weakness generalized  Other symptoms and signs involving the musculoskeletal system  Abnormal posture     Problem List Patient Active Problem List   Diagnosis Date Noted   Weakness of hip 01/17/2021   Hearing loss due to cerumen impaction, right 01/18/2020   Vaginal irritation 01/18/2020   Osteoporosis 03/10/2019   Genetic testing 11/06/2018   Tick bite 11/03/2018   Family history of breast cancer    Family history of colon cancer    Family history of stomach cancer    Family history of brain cancer    Family history of kidney cancer    Family history of oral cancer    Family history of cancer    Malignant neoplasm of upper-inner quadrant of right breast in female, estrogen receptor positive ([place]) 09/29/2018   Plantar fasciitis, right 08/15/2018   Left leg pain 11/07/2015   Myalgia 11/07/2015   Exercise counseling 03/01/2015   Dysfunction of right rotator cuff 12/14/2014   Skin tag 12/14/2014   Tongue lesion 06/14/2014   Primary osteoarthritis of both knees 05/17/2014   Annual physical exam 08/20/2013   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

## 2021-01-19 NOTE — Patient Instructions (Signed)
Access Code: [email or web address] URL: [email or web address] Date: 01/19/2021 Prepared by: Gillermo Murdoch  Exercises Prone Quadriceps Stretch with Strap - 2 x daily - 7 x weekly - 1 sets - 3 reps - 30 sec hold Hooklying Hamstring Stretch with Strap - 2 x daily - 7 x weekly - 1 sets - 3 reps - 30 sec hold Supine Piriformis Stretch with Leg Straight - 2 x daily - 7 x weekly - 1 sets - 3 reps - 30 sec hold Hip Flexor Stretch at Edge of Bed - 2 x daily - 7 x weekly - 1 sets - 3 reps - 30 sec hold Supine Transversus Abdominis Bracing with Pelvic Floor Contraction - 2 x daily - 7 x weekly - 1 sets - 10 reps - 10sec hold Sidelying Hip Abduction - 2 x daily - 7 x weekly - 2-3 sets - 10 reps - 3-5 sec hold

## 2021-01-20 NOTE — Progress Notes (Signed)
Hi Joan Lopez, Dr. Dianah Field is out of the office today.  1 additional lab test which was still pending came back in.  Your homocystine level was elevated.  This could can be seen in people who smoke or people who have B6, folic acid, or G39 deficiency.  Your B12 was normal so it does rule that out.    We will call the lab and see if we can add a B6 and a folate.  But if they cannot then we can certainly have you come back to the lab in the next couple of weeks.

## 2021-01-23 ENCOUNTER — Ambulatory Visit: Payer: Medicare Other | Admitting: Sports Medicine

## 2021-01-25 ENCOUNTER — Other Ambulatory Visit: Payer: Self-pay

## 2021-01-25 ENCOUNTER — Encounter: Payer: Self-pay | Admitting: Rehabilitative and Restorative Service Providers"

## 2021-01-25 ENCOUNTER — Ambulatory Visit (INDEPENDENT_AMBULATORY_CARE_PROVIDER_SITE_OTHER): Payer: Medicare Other | Admitting: Rehabilitative and Restorative Service Providers"

## 2021-01-25 DIAGNOSIS — R29898 Other symptoms and signs involving the musculoskeletal system: Secondary | ICD-10-CM

## 2021-01-25 DIAGNOSIS — R531 Weakness: Secondary | ICD-10-CM | POA: Diagnosis not present

## 2021-01-25 DIAGNOSIS — R293 Abnormal posture: Secondary | ICD-10-CM

## 2021-01-25 NOTE — Therapy (Signed)
[facility], [place], [address] Phone: [phone]   Fax:  [phone]  Physical Therapy Treatment  Patient Details  Name: Joan Lopez MRN: [id number] Date of Birth: 1950-11-02 Referring Provider (PT): Dr Dianah Field   Encounter Date: 01/25/2021   PT End of Session - 01/25/21 1105     Visit Number 2    Number of Visits 12    Date for PT Re-Evaluation 03/16/21    PT Start Time 1103    PT Stop Time 1147    PT Time Calculation (min) 44 min    Activity Tolerance Patient tolerated treatment well             Past Medical History:  Diagnosis Date   Abnormal LFTs 10/29/2012   Acquired bilateral renal cysts    Arthritis    Bilateral bunions    Constipation 10/16/2011   DDD (degenerative disc disease)    spine w narrowing   Dehydration, mild 12/26/2011   Diverticulitis    Exercise counseling 11/14/2011   Family history of brain cancer    Family history of breast cancer    Family history of cancer    Family history of colon cancer    Family history of kidney cancer    Family history of oral cancer    Family history of stomach cancer    Fuch's endothelial dystrophy    Guttate psoriasis    Hematuria 08/24/2011   Hemorrhoids    History of chicken pox    Hyperlipidemia    Kidney stones    Lipoma of arm 07/13/2012   Lipoma of arm 10/07/2012   Left    Mass of arm 07/13/2012   Has had them in past     Personal history of radiation therapy    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   Post-menopausal bleeding 10/08/2011   Reflux 04/17/2012   Renal insufficiency 01/11/2013   Thrombocytopenia ([place]) 09/27/2011    Past Surgical History:  Procedure Laterality Date   APPENDECTOMY  1986   BREAST LUMPECTOMY Right 10/2018   BREAST LUMPECTOMY WITH RADIOACTIVE SEED AND SENTINEL LYMPH NODE BIOPSY Right 10/24/2018   Procedure: RIGHT BREAST LUMPECTOMY WITH RADIOACTIVE SEED AND RIGHT AXILLARY SENTINEL LYMPH NODE  BIOPSY, RIGHT BLUE DYE INJECTION;  Surgeon: Fanny Skates, MD;  Location: [facility];  Service: General;  Laterality: Right;  [place]     COLONOSCOPY  2005   (records here)[place] - diverticulosis and hemorrhoids   CORNEAL TRANSPLANT  2013   CYSTOSCOPY WITH RETROGRADE PYELOGRAM, URETEROSCOPY AND STENT PLACEMENT Right 10/22/2013   Procedure: CYSTOSCOPY WITH RETROGRADE PYELOGRAM, URETEROSCOPY ;  Surgeon: Raynelle Bring, MD;  Location[facility];  Service: Urology;  Laterality: Right;   LIPOMA EXCISION  2014   x 3, both arms   TONSILLECTOMY     possible adenoids   TOTAL ABDOMINAL HYSTERECTOMY  1991   for fibroids   TUBAL LIGATION     WRIST FRACTURE SURGERY     right, titanium plate and pins    There were no vitals filed for this visit.   Subjective Assessment - 01/25/21 1105     Subjective Patient reports that she is doing exercises daily. Feels creaking in knees with HS stretch. Does not want to have increased exercies - take to much time.    Currently in Pain? No/denies    Pain Score 0-No pain  [facility] Adult PT Treatment/Exercise - 01/25/21 0001       Lumbar Exercises: Stretches   Passive Hamstring Stretch Right;Left;1 rep;30 seconds    Hip Flexor Stretch Left;Right;2 reps;30 seconds   Thomas position supine   Prone on Elbows Stretch 1 rep;60 seconds    Quad Stretch Right;Left;1 rep;30 seconds    Piriformis Stretch Right;2 reps;30 seconds    Piriformis Stretch Limitations supine travell - foot across opposite thigh resting on surface      Lumbar Exercises: Standing   Scapular Retraction Strengthening;Both;10 reps;Theraband    Theraband Level (Scapular Retraction) Level 2 (Red)    Scapular Retraction Limitations VC to engage core    Row Strengthening;Both;10 reps;Theraband    Theraband Level (Row) Level 3 (Green)    Row Limitations VC to engaged core    Shoulder Extension  Strengthening;Both;10 reps;Theraband    Theraband Level (Shoulder Extension) Level 3 (Green)    Shoulder Extension Limitations VC to engage core      Lumbar Exercises: Supine   AB Set Limitations 4 part core 10 sec x 5 reps   moving to sitting and standing     Lumbar Exercises: Sidelying   Hip Abduction Right;Left;5 reps;3 seconds;5 seconds    Hip Abduction Limitations hips forward; leading with heel   difficulty with technique - will try with modifiications     Manual Therapy   Joint Mobilization lumbar mobs PA    Myofascial Release bilat lumbar paraspinals    Other Manual Therapy deep tissue work Rt > Lt lumbar paraspinals into the Rt posterior hip                     PT Education - 01/25/21 1145     Education Details HEP    Person(s) Educated Patient    Methods Explanation;Tactile cues;Demonstration;Verbal cues;Handout    Comprehension Verbalized understanding;Returned demonstration;Verbal cues required;Tactile cues required              PT Short Term Goals - 01/19/21 1258       PT SHORT TERM GOAL #1   Title Patient independent with initial HEP    Time 4    Period Weeks    Status New    Target Date 02/16/21      PT SHORT TERM GOAL #2   Title Patient demonstrates improved symmetry in LE flexibility    Time 4    Period Weeks    Status New    Target Date 02/17/21      PT SHORT TERM GOAL #3   Title Patient demonstrate/verbalize understanding of improve posture and alignment with sitting/standing/walking    Time 4    Period Weeks    Status New    Target Date 02/17/21               PT Long Term Goals - 01/19/21 1300       PT LONG TERM GOAL #1   Title 5/5 strength bilat LE's    Time 8    Period Weeks    Status New    Target Date 03/16/21      PT LONG TERM GOAL #2   Title Improve core strength and stability in lower and upper core with patient to demonstrate impeove upright posture and alignment with posterior shoulder girdle musculature  engaged    Baseline -    Time 8    Period Weeks    Target Date 03/16/21      PT LONG TERM GOAL #3  Title Patient reports no episodes of asymmetry with running    Time 8    Period Weeks    Status New    Target Date 03/16/21      PT LONG TERM GOAL #4   Title Independent inHEP    Baseline -    Time 8    Period Weeks    Status New    Target Date 03/16/21      PT LONG TERM GOAL #5   Title Improve functional limitation score to 80    Time 8    Period Weeks    Status New    Target Date 03/16/21                   Plan - 01/25/21 1118     Clinical Impression Statement Tight Lt psoas/hip flexors; Rt piriformis/glut med. Decreased lumbar lordosis; core and hip weakness. Continued education re- importance of core strength/stability. Discussed ways of adding core work to functional activities. Corrected and modified HEP. Added core strengthening and stabilization exercises in standing.    Rehab Potential Good    PT Frequency 2x / week    PT Duration 8 weeks    PT Treatment/Interventions ADLs/Self Care Home Management;Aquatic Therapy;Cryotherapy;Electrical Stimulation;Iontophoresis 4mg /ml Dexamethasone;Moist Heat;Ultrasound;Gait training;Stair training;Functional mobility training;Therapeutic activities;Therapeutic exercise;Balance training;Neuromuscular re-education;Patient/family education;Manual techniques;Passive range of motion;Dry needling;Taping    PT Next Visit Plan review HEP; progress with exercises (add antirotation); manual work v DN Rt posterior hip/Lt psoas; myofacial ball release work; modalities as indicated - trial of DN ?posterior Rt hip, anterior Lt hip, lumbar spine paraspinals    PT Home Exercise Plan [email or web address]    Consulted and Agree with Plan of Care Patient             Patient will benefit from skilled therapeutic intervention in order to improve the following deficits and impairments:     Visit Diagnosis: Weakness generalized  Other symptoms and  signs involving the musculoskeletal system  Abnormal posture     Problem List Patient Active Problem List   Diagnosis Date Noted   Weakness of hip 01/17/2021   Hearing loss due to cerumen impaction, right 01/18/2020   Vaginal irritation 01/18/2020   Osteoporosis 03/10/2019   Genetic testing 11/06/2018   Tick bite 11/03/2018   Family history of breast cancer    Family history of colon cancer    Family history of stomach cancer    Family history of brain cancer    Family history of kidney cancer    Family history of oral cancer    Family history of cancer    Malignant neoplasm of upper-inner quadrant of right breast in female, estrogen receptor positive ([place]) 09/29/2018   Plantar fasciitis, right 08/15/2018   Left leg pain 11/07/2015   Myalgia 11/07/2015   Exercise counseling 03/01/2015   Dysfunction of right rotator cuff 12/14/2014   Skin tag 12/14/2014   Tongue lesion 06/14/2014   Primary osteoarthritis of both knees 05/17/2014   Annual physical exam 08/20/2013   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

## 2021-01-25 NOTE — Patient Instructions (Signed)
Access Code: [email or web address] URL: [email or web address] Date: 01/25/2021 Prepared by: Gillermo Murdoch  Exercises Prone Quadriceps Stretch with Strap - 2 x daily - 7 x weekly - 1 sets - 3 reps - 30 sec hold Hooklying Hamstring Stretch with Strap - 2 x daily - 7 x weekly - 1 sets - 3 reps - 30 sec hold Supine Piriformis Stretch with Leg Straight - 2 x daily - 7 x weekly - 1 sets - 3 reps - 30 sec hold Hip Flexor Stretch at Edge of Bed - 2 x daily - 7 x weekly - 1 sets - 3 reps - 30 sec hold Supine Transversus Abdominis Bracing with Pelvic Floor Contraction - 2 x daily - 7 x weekly - 1 sets - 10 reps - 10sec hold Sidelying Hip Abduction - 2 x daily - 7 x weekly - 2-3 sets - 10 reps - 3-5 sec hold Shoulder External Rotation and Scapular Retraction with Resistance - 2 x daily - 7 x weekly - 3 sets - 10 reps - 3 hold Scapular Retraction with Resistance - 2 x daily - 7 x weekly - 3 sets - 10 reps - 3 hold Scapular Retraction with Resistance Advanced - 2 x daily - 7 x weekly - 3 sets - 10 reps - 3 hold Doorway Pec Stretch at 60 Degrees Abduction - 3 x daily - 7 x weekly - 3 reps - 1 sets Doorway Pec Stretch at 90 Degrees Abduction - 3 x daily - 7 x weekly - 3 reps - 1 sets - 30 seconds hold Doorway Pec Stretch at 120 Degrees Abduction - 3 x daily - 7 x weekly - 3 reps - 1 sets - 30 second hold hold

## 2021-01-27 ENCOUNTER — Ambulatory Visit (INDEPENDENT_AMBULATORY_CARE_PROVIDER_SITE_OTHER): Payer: Medicare Other | Admitting: Rehabilitative and Restorative Service Providers"

## 2021-01-27 ENCOUNTER — Encounter: Payer: Self-pay | Admitting: Rehabilitative and Restorative Service Providers"

## 2021-01-27 ENCOUNTER — Other Ambulatory Visit: Payer: Self-pay

## 2021-01-27 DIAGNOSIS — R531 Weakness: Secondary | ICD-10-CM

## 2021-01-27 DIAGNOSIS — R29898 Other symptoms and signs involving the musculoskeletal system: Secondary | ICD-10-CM

## 2021-01-27 DIAGNOSIS — M5412 Radiculopathy, cervical region: Secondary | ICD-10-CM

## 2021-01-27 DIAGNOSIS — R293 Abnormal posture: Secondary | ICD-10-CM

## 2021-01-27 NOTE — Therapy (Signed)
[place], [place], [address] Phone: [phone]   Fax:  [phone]  Physical Therapy Treatment  Patient Details  Name: Joan Lopez MRN: [id number] Date of Birth: 1951/03/11 Referring Provider (PT): Dr Dianah Field   Encounter Date: 01/27/2021   PT End of Session - 01/27/21 1149     Visit Number 3    Number of Visits 12    Date for PT Re-Evaluation 03/16/21    PT Start Time 1194    PT Stop Time 1233    PT Time Calculation (min) 47 min    Activity Tolerance Patient tolerated treatment well             Past Medical History:  Diagnosis Date   Abnormal LFTs 10/29/2012   Acquired bilateral renal cysts    Arthritis    Bilateral bunions    Constipation 10/16/2011   DDD (degenerative disc disease)    spine w narrowing   Dehydration, mild 12/26/2011   Diverticulitis    Exercise counseling 11/14/2011   Family history of brain cancer    Family history of breast cancer    Family history of cancer    Family history of colon cancer    Family history of kidney cancer    Family history of oral cancer    Family history of stomach cancer    Fuch's endothelial dystrophy    Guttate psoriasis    Hematuria 08/24/2011   Hemorrhoids    History of chicken pox    Hyperlipidemia    Kidney stones    Lipoma of arm 07/13/2012   Lipoma of arm 10/07/2012   Left    Mass of arm 07/13/2012   Has had them in past     Personal history of radiation therapy    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   Post-menopausal bleeding 10/08/2011   Reflux 04/17/2012   Renal insufficiency 01/11/2013   Thrombocytopenia ([place]) 09/27/2011    Past Surgical History:  Procedure Laterality Date   APPENDECTOMY  1986   BREAST LUMPECTOMY Right 10/2018   BREAST LUMPECTOMY WITH RADIOACTIVE SEED AND SENTINEL LYMPH NODE BIOPSY Right 10/24/2018   Procedure: RIGHT BREAST LUMPECTOMY WITH RADIOACTIVE SEED AND RIGHT AXILLARY SENTINEL LYMPH NODE  BIOPSY, RIGHT BLUE DYE INJECTION;  Surgeon: Fanny Skates, MD;  Location: [facility];  Service: General;  Laterality: Right;  [place]     COLONOSCOPY  2005   (records here)[place] - diverticulosis and hemorrhoids   CORNEAL TRANSPLANT  2013   CYSTOSCOPY WITH RETROGRADE PYELOGRAM, URETEROSCOPY AND STENT PLACEMENT Right 10/22/2013   Procedure: CYSTOSCOPY WITH RETROGRADE PYELOGRAM, URETEROSCOPY ;  Surgeon: Raynelle Bring, MD;  Location: [facility];  Service: Urology;  Laterality: Right;   LIPOMA EXCISION  2014   x 3, both arms   TONSILLECTOMY     possible adenoids   TOTAL ABDOMINAL HYSTERECTOMY  1991   for fibroids   TUBAL LIGATION     WRIST FRACTURE SURGERY     right, titanium plate and pins    There were no vitals filed for this visit.   Subjective Assessment - 01/27/21 1149     Subjective Working on exercises and has made modifications to exercises at home but the exercises take tooo long. More aware of the posture and alignment. Ran 5 miles [place] and 6.5 miles yesterday with some hills with no problem.    Currently in Pain? No/denies  Pain Score 0-No pain                               [facility] Adult PT Treatment/Exercise - 01/27/21 0001       Self-Care   Self-Care Other Self-Care Comments    Other Self-Care Comments  education re- exercise; importance of core stabilization/strengthening      Lumbar Exercises: Stretches   Piriformis Stretch Right;2 reps;30 seconds    Piriformis Stretch Limitations supine travell - foot across opposite thigh resting on surface repeated with foot unsupported, knee closer to chest      Lumbar Exercises: Standing   Wall Slides 5 reps    Wall Slides Limitations 10 sec hold - some knee pain    Other Standing Lumbar Exercises standing chest press 5# KB with core engaged      Lumbar Exercises: Seated   Sit to Stand 5 reps    Sit to Stand Limitations some Lt knee pain       Lumbar Exercises: Supine   AB Set Limitations 4 part core 10 sec x 5 reps   moving to sitting and standing     Electrical Stimulation   Electrical Stimulation Location Rt posterior hip    Electrical Stimulation Action mAmp x 5 min; micro x 5 min    Electrical Stimulation Parameters to tolerance    Electrical Stimulation Goals Pain;Tone      Manual Therapy   Manual therapy comments skilled palpation to assess response to DN and manual work    Joint Mobilization PA mobs Rt posterior greater trochanter    Myofascial Release Rt posterior hip through gluts/piriformis    Other Manual Therapy deep tissue work Rt posterior hip - gluts and piriformis              Trigger Point Dry Needling - 01/27/21 0001     Consent Given? Yes    Education Handout Provided Yes    Dry Needling Comments Rt    Electrical Stimulation Performed with Dry Needling Yes    E-stim with Dry Needling Details 5 min mAmp current; % min micro    Gluteus Medius Response Palpable increased muscle length;Twitch response elicited    Gluteus Maximus Response Palpable increased muscle length;Twitch response elicited    Piriformis Response Palpable increased muscle length;Twitch response elicited                   PT Education - 01/27/21 1239     Education Details DN HEP    Person(s) Educated Patient    Methods Explanation;Demonstration;Tactile cues;Verbal cues;Handout    Comprehension Verbalized understanding;Returned demonstration;Verbal cues required;Tactile cues required              PT Short Term Goals - 01/19/21 1258       PT SHORT TERM GOAL #1   Title Patient independent with initial HEP    Time 4    Period Weeks    Status New    Target Date 02/16/21      PT SHORT TERM GOAL #2   Title Patient demonstrates improved symmetry in LE flexibility    Time 4    Period Weeks    Status New    Target Date 02/17/21      PT SHORT TERM GOAL #3   Title Patient demonstrate/verbalize understanding  of improve posture and alignment with sitting/standing/walking    Time 4    Period Weeks  Status New    Target Date 02/17/21               PT Long Term Goals - 01/19/21 1300       PT LONG TERM GOAL #1   Title 5/5 strength bilat LE's    Time 8    Period Weeks    Status New    Target Date 03/16/21      PT LONG TERM GOAL #2   Title Improve core strength and stability in lower and upper core with patient to demonstrate impeove upright posture and alignment with posterior shoulder girdle musculature engaged    Baseline -    Time 8    Period Weeks    Target Date 03/16/21      PT LONG TERM GOAL #3   Title Patient reports no episodes of asymmetry with running    Time 8    Period Weeks    Status New    Target Date 03/16/21      PT LONG TERM GOAL #4   Title Independent inHEP    Baseline -    Time 8    Period Weeks    Status New    Target Date 03/16/21      PT LONG TERM GOAL #5   Title Improve functional limitation score to 80    Time 8    Period Weeks    Status New    Target Date 03/16/21                   Plan - 01/27/21 1152     Clinical Impression Statement Patient is concerned about the time it takes to do her HEP - modified again to decrease exercises and drop some exercises to QOD. Added chest press with 5# to work core. Continued education re-exercise; posture and alignment; DN. Trial of DN to posterior Rt hip - glut med/piriformis which pt tolerated well. Add antirotation next visit.    Rehab Potential Good    PT Frequency 2x / week    PT Duration 8 weeks    PT Treatment/Interventions ADLs/Self Care Home Management;Aquatic Therapy;Cryotherapy;Electrical Stimulation;Iontophoresis 4mg /ml Dexamethasone;Moist Heat;Ultrasound;Gait training;Stair training;Functional mobility training;Therapeutic activities;Therapeutic exercise;Balance training;Neuromuscular re-education;Patient/family education;Manual techniques;Passive range of motion;Dry  needling;Taping    PT Next Visit Plan review HEP; progress with exercises (add antirotation); manual work v DN Rt posterior hip/Lt psoas; myofacial ball release work; modalities as indicated - assess response to DN posterior Rt hip, consider DN anterior Lt hip, lumbar spine paraspinals - add antirotation TB    PT Home Exercise Plan [email or web address]    Consulted and Agree with Plan of Care Patient             Patient will benefit from skilled therapeutic intervention in order to improve the following deficits and impairments:     Visit Diagnosis: Weakness generalized  Other symptoms and signs involving the musculoskeletal system  Abnormal posture  Radiculopathy, cervical region     Problem List Patient Active Problem List   Diagnosis Date Noted   Weakness of hip 01/17/2021   Hearing loss due to cerumen impaction, right 01/18/2020   Vaginal irritation 01/18/2020   Osteoporosis 03/10/2019   Genetic testing 11/06/2018   Tick bite 11/03/2018   Family history of breast cancer    Family history of colon cancer    Family history of stomach cancer    Family history of brain cancer    Family history of kidney cancer    Family history  of oral cancer    Family history of cancer    Malignant neoplasm of upper-inner quadrant of right breast in female, estrogen receptor positive ([place]) 09/29/2018   Plantar fasciitis, right 08/15/2018   Left leg pain 11/07/2015   Myalgia 11/07/2015   Exercise counseling 03/01/2015   Dysfunction of right rotator cuff 12/14/2014   Skin tag 12/14/2014   Tongue lesion 06/14/2014   Primary osteoarthritis of both knees 05/17/2014   Annual physical exam 08/20/2013   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 [place], [place], [address] Phone: [phone]   Fax:  [phone]  Name: Joan Lopez MRN: [id number] Date of Birth: [birth year]

## 2021-01-27 NOTE — Patient Instructions (Addendum)
Trigger Point Dry Needling  What is Trigger Point Dry Needling (DN)? DN is a physical therapy technique used to treat muscle pain and dysfunction. Specifically, DN helps deactivate muscle trigger points (muscle knots).  A thin filiform needle is used to penetrate the skin and stimulate the underlying trigger point. The goal is for a local twitch response (LTR) to occur and for the trigger point to relax. No medication of any kind is injected during the procedure.   What Does Trigger Point Dry Needling Feel Like?  The procedure feels different for each individual patient. Some patients report that they do not actually feel the needle enter the skin and overall the process is not painful. Very mild bleeding may occur. However, many patients feel a deep cramping in the muscle in which the needle was inserted. This is the local twitch response.   How Will I feel after the treatment? Soreness is normal, and the onset of soreness may not occur for a few hours. Typically this soreness does not last longer than two days.  Bruising is uncommon, however; ice can be used to decrease any possible bruising.  In rare cases feeling tired or nauseous after the treatment is normal. In addition, your symptoms may get worse before they get better, this period will typically not last longer than 24 hours.   What Can I do After My Treatment? Increase your hydration by drinking more water for the next 24 hours. You may place ice or heat on the areas treated that have become sore, however, do not use heat on inflamed or bruised areas. Heat often brings more relief post needling. You can continue your regular activities, but vigorous activity is not recommended initially after the treatment for 24 hours. DN is best combined with other physical therapy such as strengthening, stretching, and other therapies.  Access Code: [email or web address] URL: [email or web address] Date: 01/27/2021 Prepared by: Gillermo Murdoch  Exercises Prone Quadriceps Stretch with Strap - 2 x daily - 7 x weekly - 1 sets - 3 reps - 30 sec hold Hooklying Hamstring Stretch with Strap - 2 x daily - 7 x weekly - 1 sets - 3 reps - 30 sec hold Supine Piriformis Stretch with Leg Straight - 2 x daily - 7 x weekly - 1 sets - 3 reps - 30 sec hold Hip Flexor Stretch at Edge of Bed - 2 x daily - 7 x weekly - 1 sets - 3 reps - 30 sec hold Supine Transversus Abdominis Bracing with Pelvic Floor Contraction - 2 x daily - 7 x weekly - 1 sets - 10 reps - 10sec hold Sidelying Hip Abduction - 2 x daily - 7 x weekly - 2-3 sets - 10 reps - 3-5 sec hold Shoulder External Rotation and Scapular Retraction with Resistance - 2 x daily - 7 x weekly - 3 sets - 10 reps - 3 hold Scapular Retraction with Resistance - 2 x daily - 7 x weekly - 3 sets - 10 reps - 3 hold Scapular Retraction with Resistance Advanced - 2 x daily - 7 x weekly - 3 sets - 10 reps - 3 hold Doorway Pec Stretch at 60 Degrees Abduction - 3 x daily - 7 x weekly - 1 sets - 3 reps Doorway Pec Stretch at 90 Degrees Abduction - 3 x daily - 7 x weekly - 1 sets - 3 reps - 30 seconds hold Doorway Pec Stretch at 120 Degrees Abduction - 3 x daily - 7 x weekly -  1 sets - 3 reps - 30 second hold hold Standing Chest Press with Dumbbells - 7 x weekly - 2 sets - 10 reps - 3 sec hold

## 2021-01-30 ENCOUNTER — Encounter: Payer: Self-pay | Admitting: Rehabilitative and Restorative Service Providers"

## 2021-01-30 ENCOUNTER — Other Ambulatory Visit: Payer: Self-pay

## 2021-01-30 ENCOUNTER — Ambulatory Visit (INDEPENDENT_AMBULATORY_CARE_PROVIDER_SITE_OTHER): Payer: Medicare Other | Admitting: Rehabilitative and Restorative Service Providers"

## 2021-01-30 DIAGNOSIS — R293 Abnormal posture: Secondary | ICD-10-CM | POA: Diagnosis not present

## 2021-01-30 DIAGNOSIS — M5412 Radiculopathy, cervical region: Secondary | ICD-10-CM | POA: Diagnosis not present

## 2021-01-30 DIAGNOSIS — R29898 Other symptoms and signs involving the musculoskeletal system: Secondary | ICD-10-CM

## 2021-01-30 DIAGNOSIS — R531 Weakness: Secondary | ICD-10-CM

## 2021-01-30 NOTE — Patient Instructions (Signed)
Access Code: [email or web address] URL: [email or web address] Date: 01/30/2021 Prepared by: Gillermo Murdoch  Exercises Prone Quadriceps Stretch with Strap - 2 x daily - 7 x weekly - 1 sets - 3 reps - 30 sec hold Hooklying Hamstring Stretch with Strap - 2 x daily - 7 x weekly - 1 sets - 3 reps - 30 sec hold Supine Piriformis Stretch with Leg Straight - 2 x daily - 7 x weekly - 1 sets - 3 reps - 30 sec hold Hip Flexor Stretch at Edge of Bed - 2 x daily - 7 x weekly - 1 sets - 3 reps - 30 sec hold Supine Transversus Abdominis Bracing with Pelvic Floor Contraction - 2 x daily - 7 x weekly - 1 sets - 10 reps - 10sec hold Sidelying Hip Abduction - 2 x daily - 7 x weekly - 2-3 sets - 10 reps - 3-5 sec hold Shoulder External Rotation and Scapular Retraction with Resistance - 2 x daily - 7 x weekly - 3 sets - 10 reps - 3 hold Scapular Retraction with Resistance - 2 x daily - 7 x weekly - 3 sets - 10 reps - 3 hold Doorway Pec Stretch at 60 Degrees Abduction - 3 x daily - 7 x weekly - 1 sets - 3 reps Doorway Pec Stretch at 90 Degrees Abduction - 3 x daily - 7 x weekly - 1 sets - 3 reps - 30 seconds hold Doorway Pec Stretch at 120 Degrees Abduction - 3 x daily - 7 x weekly - 1 sets - 3 reps - 30 second hold hold Side Stepping with Resistance at Thighs and Counter Support Standing Shoulder Row Reactive Isometric - 5-10 reps - 60 sec hold Anti-Rotation Lateral Stepping with Press - 2 x daily - 7 x weekly - 1-2 sets - 10 reps - 2-3 sec hold

## 2021-01-30 NOTE — Therapy (Signed)
[place], [place], [address] Phone: [phone]   Fax:  [phone]  Physical Therapy Treatment  Patient Details  Name: Joan Lopez MRN: [id number] Date of Birth: [birth year] Referring Provider (PT): Dr Dianah Field   Encounter Date: 01/30/2021   PT End of Session - 01/30/21 0958     Visit Number 4    Number of Visits 12    Date for PT Re-Evaluation 03/16/21    PT Start Time 0845    PT Stop Time 0933    PT Time Calculation (min) 48 min    Activity Tolerance Patient tolerated treatment well             Past Medical History:  Diagnosis Date   Abnormal LFTs 10/29/2012   Acquired bilateral renal cysts    Arthritis    Bilateral bunions    Constipation 10/16/2011   DDD (degenerative disc disease)    spine w narrowing   Dehydration, mild 12/26/2011   Diverticulitis    Exercise counseling 11/14/2011   Family history of brain cancer    Family history of breast cancer    Family history of cancer    Family history of colon cancer    Family history of kidney cancer    Family history of oral cancer    Family history of stomach cancer    Fuch's endothelial dystrophy    Guttate psoriasis    Hematuria 08/24/2011   Hemorrhoids    History of chicken pox    Hyperlipidemia    Kidney stones    Lipoma of arm 07/13/2012   Lipoma of arm 10/07/2012   Left    Mass of arm 07/13/2012   Has had them in past     Personal history of radiation therapy    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   Post-menopausal bleeding 10/08/2011   Reflux 04/17/2012   Renal insufficiency 01/11/2013   Thrombocytopenia ([place]) 09/27/2011    Past Surgical History:  Procedure Laterality Date   APPENDECTOMY  1986   BREAST LUMPECTOMY Right 10/2018   BREAST LUMPECTOMY WITH RADIOACTIVE SEED AND SENTINEL LYMPH NODE BIOPSY Right 10/24/2018   Procedure: RIGHT BREAST LUMPECTOMY WITH RADIOACTIVE SEED AND RIGHT AXILLARY SENTINEL LYMPH NODE  BIOPSY, RIGHT BLUE DYE INJECTION;  Surgeon: Fanny Skates, MD;  Location: [facility];  Service: General;  Laterality: Right;  [place]     COLONOSCOPY  2005   (records here)[place] - diverticulosis and hemorrhoids   CORNEAL TRANSPLANT  2013   CYSTOSCOPY WITH RETROGRADE PYELOGRAM, URETEROSCOPY AND STENT PLACEMENT Right 10/22/2013   Procedure: [place], URETEROSCOPY ;  Surgeon: Raynelle Bring, MD;  [facility];  Service: Urology;  Laterality: Right;   LIPOMA EXCISION  2014   x 3, both arms   TONSILLECTOMY     possible adenoids   TOTAL ABDOMINAL HYSTERECTOMY  1991   for fibroids   TUBAL LIGATION     WRIST FRACTURE SURGERY     right, titanium plate and pins    There were no vitals filed for this visit.   Subjective Assessment - 01/30/21 0850     Subjective Patient reports that the exercises are going well at home. She is doing exercises twice a day and it takes less than an hour. Ran 12.7 miles Saturday and did ok. Had some pain in the Lt knee that was more noticable than it has been.  Currently in Pain? No/denies    Pain Score 0-No pain                [facility] PT Assessment - 01/30/21 0001       Assessment   Medical Diagnosis Weakness bilat hips; lumbar stenosis    Referring Provider (PT) Dr Dianah Field    Onset Date/Surgical Date 01/08/21    Hand Dominance Right    Next MD Visit 03/14/21    Prior Therapy here for neck 2/21      Flexibility   Piriformis tight Rt > Lt      Palpation   Palpation comment muscular tightness Rt > Lt piriformis; glut med; Lt QL; Lt psoas                           [facility] Adult PT Treatment/Exercise - 01/30/21 0001       Therapeutic Activites    Therapeutic Activities Other Therapeutic Activities    Other Therapeutic Activities myofacial release hip flexors      Lumbar Exercises: Stretches   Hip Flexor Stretch Left;Right;2 reps;30  seconds    Hip Flexor Stretch Limitations Thomas position supine    Prone on Elbows Stretch 1 rep;60 seconds    Piriformis Stretch Right;2 reps;30 seconds    Piriformis Stretch Limitations supine travell - foot across opposite thigh resting on surface repeated with foot unsupported, knee closer to chest      Lumbar Exercises: Standing   Row Strengthening;Both;Theraband    Theraband Level (Row) Level 4 (Blue)    Row Limitations reactive isometric step back and hold 60 sec    Other Standing Lumbar Exercises antirotation green TB x 10 reps each side VC to engage core    Other Standing Lumbar Exercises monster walks green TB crossed under feet ~ 10 feet x 4 reps each side      Lumbar Exercises: Seated   Sit to Stand Limitations hold      Lumbar Exercises: Supine   AB Set Limitations working on core in standing, walking, running, sitting in car      Manual Therapy   Soft tissue mobilization myofacial release work Lt psoas                     PT Education - 01/30/21 0939     Education Details HEP    Person(s) Educated Patient    Methods Explanation;Demonstration;Tactile cues;Verbal cues;Handout    Comprehension Verbalized understanding;Returned demonstration;Verbal cues required;Tactile cues required              PT Short Term Goals - 01/19/21 1258       PT SHORT TERM GOAL #1   Title Patient independent with initial HEP    Time 4    Period Weeks    Status New    Target Date 02/16/21      PT SHORT TERM GOAL #2   Title Patient demonstrates improved symmetry in LE flexibility    Time 4    Period Weeks    Status New    Target Date 02/17/21      PT SHORT TERM GOAL #3   Title Patient demonstrate/verbalize understanding of improve posture and alignment with sitting/standing/walking    Time 4    Period Weeks    Status New    Target Date 02/17/21               PT Long Term Goals - 01/19/21 1300  PT LONG TERM GOAL #1   Title 5/5 strength bilat  LE's    Time 8    Period Weeks    Status New    Target Date 03/16/21      PT LONG TERM GOAL #2   Title Improve core strength and stability in lower and upper core with patient to demonstrate impeove upright posture and alignment with posterior shoulder girdle musculature engaged    Baseline -    Time 8    Period Weeks    Target Date 03/16/21      PT LONG TERM GOAL #3   Title Patient reports no episodes of asymmetry with running    Time 8    Period Weeks    Status New    Target Date 03/16/21      PT LONG TERM GOAL #4   Title Independent inHEP    Baseline -    Time 8    Period Weeks    Status New    Target Date 03/16/21      PT LONG TERM GOAL #5   Title Improve functional limitation score to 80    Time 8    Period Weeks    Status New    Target Date 03/16/21                   Plan - 01/30/21 0918     Clinical Impression Statement Patient ran 12.7 miles Saturday without presenting symptoms. She does report some Lt knee discomfort which seemed slightly worse than she has experienced in the past. No bending forward or listing to the side. Patient continues to have muscular tightness to palpation through the Lt > Rt iliopsoas and Rt > Lt posterior hip. Added hip strengthening and core stabilization exercises replacing other exercises in HEP to keep exercise time more reasonable.    Rehab Potential Good    PT Frequency 2x / week    PT Duration 8 weeks    PT Treatment/Interventions ADLs/Self Care Home Management;Aquatic Therapy;Cryotherapy;Electrical Stimulation;Iontophoresis 4mg /ml Dexamethasone;Moist Heat;Ultrasound;Gait training;Stair training;Functional mobility training;Therapeutic activities;Therapeutic exercise;Balance training;Neuromuscular re-education;Patient/family education;Manual techniques;Passive range of motion;Dry needling;Taping    PT Next Visit Plan review HEP; progress with exercises working hips and core; manual work v DN Rt posterior hip/Lt psoas;  myofacial ball release work; modalities as indicated - continue DN posterior Rt hip, anterior Lt hip, lumbar spine paraspinals as indicated - check antirotation TB    PT Home Exercise Plan [email or web address]    Consulted and Agree with Plan of Care Patient             Patient will benefit from skilled therapeutic intervention in order to improve the following deficits and impairments:     Visit Diagnosis: Weakness generalized  Other symptoms and signs involving the musculoskeletal system  Abnormal posture  Radiculopathy, cervical region     Problem List Patient Active Problem List   Diagnosis Date Noted   Weakness of hip 01/17/2021   Hearing loss due to cerumen impaction, right 01/18/2020   Vaginal irritation 01/18/2020   Osteoporosis 03/10/2019   Genetic testing 11/06/2018   Tick bite 11/03/2018   Family history of breast cancer    Family history of colon cancer    Family history of stomach cancer    Family history of brain cancer    Family history of kidney cancer    Family history of oral cancer    Family history of cancer    Malignant neoplasm of upper-inner quadrant of  right breast in female, estrogen receptor positive ([place]) 09/29/2018   Plantar fasciitis, right 08/15/2018   Left leg pain 11/07/2015   Myalgia 11/07/2015   Exercise counseling 03/01/2015   Dysfunction of right rotator cuff 12/14/2014   Skin tag 12/14/2014   Tongue lesion 06/14/2014   Primary osteoarthritis of both knees 05/17/2014   Annual physical exam 08/20/2013   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

## 2021-02-01 ENCOUNTER — Other Ambulatory Visit: Payer: Self-pay

## 2021-02-01 ENCOUNTER — Encounter: Payer: Self-pay | Admitting: Rehabilitative and Restorative Service Providers"

## 2021-02-01 ENCOUNTER — Ambulatory Visit (INDEPENDENT_AMBULATORY_CARE_PROVIDER_SITE_OTHER): Payer: Medicare Other | Admitting: Rehabilitative and Restorative Service Providers"

## 2021-02-01 DIAGNOSIS — R293 Abnormal posture: Secondary | ICD-10-CM | POA: Diagnosis not present

## 2021-02-01 DIAGNOSIS — R531 Weakness: Secondary | ICD-10-CM | POA: Diagnosis not present

## 2021-02-01 DIAGNOSIS — R29898 Other symptoms and signs involving the musculoskeletal system: Secondary | ICD-10-CM

## 2021-02-01 NOTE — Therapy (Signed)
[facility] [place], [place], [address] Phone: [phone]   Fax:  [phone]  Physical Therapy Treatment  Patient Details  Name: Joan Lopez MRN: [id number] Date of Birth: [birth year] Referring Provider (PT): Dr Dianah Field   Encounter Date: 02/01/2021   PT End of Session - 02/01/21 0848     Visit Number 5    Number of Visits 12    Date for PT Re-Evaluation 03/16/21    PT Start Time 0846    PT Stop Time 0930    PT Time Calculation (min) 44 min    Activity Tolerance Patient tolerated treatment well             Past Medical History:  Diagnosis Date   Abnormal LFTs 10/29/2012   Acquired bilateral renal cysts    Arthritis    Bilateral bunions    Constipation 10/16/2011   DDD (degenerative disc disease)    spine w narrowing   Dehydration, mild 12/26/2011   Diverticulitis    Exercise counseling 11/14/2011   Family history of brain cancer    Family history of breast cancer    Family history of cancer    Family history of colon cancer    Family history of kidney cancer    Family history of oral cancer    Family history of stomach cancer    Fuch's endothelial dystrophy    Guttate psoriasis    Hematuria 08/24/2011   Hemorrhoids    History of chicken pox    Hyperlipidemia    Kidney stones    Lipoma of arm 07/13/2012   Lipoma of arm 10/07/2012   Left    Mass of arm 07/13/2012   Has had them in past     Personal history of radiation therapy    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   Post-menopausal bleeding 10/08/2011   Reflux 04/17/2012   Renal insufficiency 01/11/2013   Thrombocytopenia ([place]) 09/27/2011    Past Surgical History:  Procedure Laterality Date   APPENDECTOMY  1986   BREAST LUMPECTOMY Right 10/2018   BREAST LUMPECTOMY WITH RADIOACTIVE SEED AND SENTINEL LYMPH NODE BIOPSY Right 10/24/2018   Procedure: RIGHT BREAST LUMPECTOMY WITH RADIOACTIVE SEED AND RIGHT AXILLARY SENTINEL LYMPH NODE  BIOPSY, RIGHT BLUE DYE INJECTION;  Surgeon: Fanny Skates, MD;  Location: [facility];  Service: General;  Laterality: Right;  [place]     COLONOSCOPY  2005   (records here)[place] - diverticulosis and hemorrhoids   CORNEAL TRANSPLANT  2013   CYSTOSCOPY WITH RETROGRADE PYELOGRAM, URETEROSCOPY AND STENT PLACEMENT Right 10/22/2013   Procedure: CYSTOSCOPY WITH RETROGRADE PYELOGRAM, URETEROSCOPY ;  Surgeon: Raynelle Bring, MD;  Location: [facility];  Service: Urology;  Laterality: Right;   LIPOMA EXCISION  2014   x 3, both arms   TONSILLECTOMY     possible adenoids   TOTAL ABDOMINAL HYSTERECTOMY  1991   for fibroids   TUBAL LIGATION     WRIST FRACTURE SURGERY     right, titanium plate and pins    There were no vitals filed for this visit.   Subjective Assessment - 02/01/21 0849     Subjective Patient reports that she is working on exercises. Time required for exercises has decreased. Lt knee hurts when she runs.    Currently in Pain? No/denies    Pain Score 0-No pain  [facility] PT Assessment - 02/01/21 0001       Assessment   Medical Diagnosis Weakness bilat hips; lumbar stenosis    Referring Provider (PT) Dr Dianah Field    Onset Date/Surgical Date 01/08/21    Hand Dominance Right    Next MD Visit 03/14/21    Prior Therapy here for neck 2/21      Palpation   Patella mobility laterally tracking Lt patella with tightness in the lateral quad and insertion of quad to lateral superior patellar pole                           [facility] Adult PT Treatment/Exercise - 02/01/21 0001       Lumbar Exercises: Stretches   Hip Flexor Stretch Left;Right;2 reps;30 seconds    Hip Flexor Stretch Limitations Thomas position supine    Prone on Elbows Stretch 1 rep;60 seconds    Quad Stretch Right;Left;3 reps;30 seconds    Piriformis Stretch Right;2 reps;30 seconds    Piriformis Stretch Limitations supine  travell - foot across opposite thigh resting on surface repeated with foot unsupported, knee closer to chest      Manual Therapy   Manual therapy comments skilled palpation to assess response to DN and manual work    Joint Mobilization lateral mobs Lt patella    Soft tissue mobilization myofacial release work Lt lateral quad    Other Manual Therapy deep tissue work Lt lateral quad    Kinesiotex Inhibit Muscle;Facilitate Muscle      Kinesiotix   Inhibit Muscle  inhibit lateral quad    Facilitate Muscle  facilitate medial quad - correct patellar alignment              Trigger Point Dry Needling - 02/01/21 0001     Consent Given? Yes    Education Handout Provided Previously provided    Dry Needling Comments Lt    Quadriceps Response Palpable increased muscle length;Twitch response elicited                   PT Education - 02/01/21 0932     Education Details kinesotape    Person(s) Educated Patient    Methods Explanation;Handout    Comprehension Verbalized understanding              PT Short Term Goals - 01/19/21 1258       PT SHORT TERM GOAL #1   Title Patient independent with initial HEP    Time 4    Period Weeks    Status New    Target Date 02/16/21      PT SHORT TERM GOAL #2   Title Patient demonstrates improved symmetry in LE flexibility    Time 4    Period Weeks    Status New    Target Date 02/17/21      PT SHORT TERM GOAL #3   Title Patient demonstrate/verbalize understanding of improve posture and alignment with sitting/standing/walking    Time 4    Period Weeks    Status New    Target Date 02/17/21               PT Long Term Goals - 01/19/21 1300       PT LONG TERM GOAL #1   Title 5/5 strength bilat LE's    Time 8    Period Weeks    Status New    Target Date 03/16/21      PT LONG  TERM GOAL #2   Title Improve core strength and stability in lower and upper core with patient to demonstrate impeove upright posture and  alignment with posterior shoulder girdle musculature engaged    Baseline -    Time 8    Period Weeks    Target Date 03/16/21      PT LONG TERM GOAL #3   Title Patient reports no episodes of asymmetry with running    Time 8    Period Weeks    Status New    Target Date 03/16/21      PT LONG TERM GOAL #4   Title Independent inHEP    Baseline -    Time 8    Period Weeks    Status New    Target Date 03/16/21      PT LONG TERM GOAL #5   Title Improve functional limitation score to 80    Time 8    Period Weeks    Status New    Target Date 03/16/21                   Plan - 02/01/21 0931     Clinical Impression Statement Exercises going well at home and taking less time. Continues to notice Lt knee pain with running. Noticed poor tracking of Lt patella laterally. Trial of DN and kineso taping to correct patellar alignment.    Rehab Potential Good    PT Frequency 2x / week    PT Duration 8 weeks    PT Treatment/Interventions ADLs/Self Care Home Management;Aquatic Therapy;Cryotherapy;Electrical Stimulation;Iontophoresis 4mg /ml Dexamethasone;Moist Heat;Ultrasound;Gait training;Stair training;Functional mobility training;Therapeutic activities;Therapeutic exercise;Balance training;Neuromuscular re-education;Patient/family education;Manual techniques;Passive range of motion;Dry needling;Taping    PT Next Visit Plan review HEP; progress with exercises working hips and core; manual work v DN Rt posterior hip/Lt psoas; myofacial ball release work; modalities as indicated - continue DN posterior Rt hip, anterior Lt hip, lumbar spine paraspinals as indicated - check antirotation TB    PT Home Exercise Plan [email or web address]    Consulted and Agree with Plan of Care Patient             Patient will benefit from skilled therapeutic intervention in order to improve the following deficits and impairments:     Visit Diagnosis: Weakness generalized  Other symptoms and signs involving the  musculoskeletal system  Abnormal posture     Problem List Patient Active Problem List   Diagnosis Date Noted   Weakness of hip 01/17/2021   Hearing loss due to cerumen impaction, right 01/18/2020   Vaginal irritation 01/18/2020   Osteoporosis 03/10/2019   Genetic testing 11/06/2018   Tick bite 11/03/2018   Family history of breast cancer    Family history of colon cancer    Family history of stomach cancer    Family history of brain cancer    Family history of kidney cancer    Family history of oral cancer    Family history of cancer    Malignant neoplasm of upper-inner quadrant of right breast in female, estrogen receptor positive ([place]) 09/29/2018   Plantar fasciitis, right 08/15/2018   Left leg pain 11/07/2015   Myalgia 11/07/2015   Exercise counseling 03/01/2015   Dysfunction of right rotator cuff 12/14/2014   Skin tag 12/14/2014   Tongue lesion 06/14/2014   Primary osteoarthritis of both knees 05/17/2014   Annual physical exam 08/20/2013   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

## 2021-02-01 NOTE — Patient Instructions (Signed)

## 2021-02-06 ENCOUNTER — Encounter: Payer: Self-pay | Admitting: Rehabilitative and Restorative Service Providers"

## 2021-02-06 ENCOUNTER — Ambulatory Visit (INDEPENDENT_AMBULATORY_CARE_PROVIDER_SITE_OTHER): Payer: Medicare Other | Admitting: Rehabilitative and Restorative Service Providers"

## 2021-02-06 ENCOUNTER — Other Ambulatory Visit: Payer: Self-pay

## 2021-02-06 ENCOUNTER — Other Ambulatory Visit: Payer: Self-pay | Admitting: Sports Medicine

## 2021-02-06 DIAGNOSIS — R29898 Other symptoms and signs involving the musculoskeletal system: Secondary | ICD-10-CM

## 2021-02-06 DIAGNOSIS — N898 Other specified noninflammatory disorders of vagina: Secondary | ICD-10-CM

## 2021-02-06 DIAGNOSIS — R531 Weakness: Secondary | ICD-10-CM | POA: Diagnosis not present

## 2021-02-06 DIAGNOSIS — R293 Abnormal posture: Secondary | ICD-10-CM | POA: Diagnosis not present

## 2021-02-06 DIAGNOSIS — M5412 Radiculopathy, cervical region: Secondary | ICD-10-CM | POA: Diagnosis not present

## 2021-02-06 NOTE — Therapy (Signed)
[facility] [place], [place], [address] Phone: [phone]   Fax:  [phone]  Physical Therapy Treatment  Patient Details  Name: Joan Lopez MRN: [id number] Date of Birth: Jan 28, 1951 Referring Provider (PT): Dr Dianah Field   Encounter Date: 02/06/2021   PT End of Session - 02/06/21 1435     Visit Number 6    Number of Visits 12    Date for PT Re-Evaluation 03/16/21    PT Start Time 7342    PT Stop Time 1515    PT Time Calculation (min) 41 min    Activity Tolerance Patient tolerated treatment well             Past Medical History:  Diagnosis Date   Abnormal LFTs 10/29/2012   Acquired bilateral renal cysts    Arthritis    Bilateral bunions    Constipation 10/16/2011   DDD (degenerative disc disease)    spine w narrowing   Dehydration, mild 12/26/2011   Diverticulitis    Exercise counseling 11/14/2011   Family history of brain cancer    Family history of breast cancer    Family history of cancer    Family history of colon cancer    Family history of kidney cancer    Family history of oral cancer    Family history of stomach cancer    Fuch's endothelial dystrophy    Guttate psoriasis    Hematuria 08/24/2011   Hemorrhoids    History of chicken pox    Hyperlipidemia    Kidney stones    Lipoma of arm 07/13/2012   Lipoma of arm 10/07/2012   Left    Mass of arm 07/13/2012   Has had them in past     Personal history of radiation therapy    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   Post-menopausal bleeding 10/08/2011   Reflux 04/17/2012   Renal insufficiency 01/11/2013   Thrombocytopenia ([place]) 09/27/2011    Past Surgical History:  Procedure Laterality Date   APPENDECTOMY  1986   BREAST LUMPECTOMY Right 10/2018   BREAST LUMPECTOMY WITH RADIOACTIVE SEED AND SENTINEL LYMPH NODE BIOPSY Right 10/24/2018   Procedure: RIGHT BREAST LUMPECTOMY WITH RADIOACTIVE SEED AND RIGHT AXILLARY SENTINEL LYMPH NODE  BIOPSY, RIGHT BLUE DYE INJECTION;  Surgeon: Fanny Skates, MD;  Location: [facility];  Service: General;  Laterality: Right;  [place]     COLONOSCOPY  2005   (records here)[place] - diverticulosis and hemorrhoids   CORNEAL TRANSPLANT  2013   CYSTOSCOPY WITH RETROGRADE PYELOGRAM, URETEROSCOPY AND STENT PLACEMENT Right 10/22/2013   Procedure: CYSTOSCOPY WITH RETROGRADE PYELOGRAM, URETEROSCOPY ;  Surgeon: Raynelle Bring, MD;  Location[facility];  Service: Urology;  Laterality: Right;   LIPOMA EXCISION  2014   x 3, both arms   TONSILLECTOMY     possible adenoids   TOTAL ABDOMINAL HYSTERECTOMY  1991   for fibroids   TUBAL LIGATION     WRIST FRACTURE SURGERY     right, titanium plate and pins    There were no vitals filed for this visit.   Subjective Assessment - 02/06/21 1436     Subjective Feeling on discomfort in the Lt knee with running. Seeing Dr Dianah Field Wednesday for evaluaation of her Lt knee.    Currently in Pain? Yes    Pain Score 2     Pain Location Knee    Pain Orientation Left  Pain Descriptors / Indicators Discomfort    Pain Type Acute pain;Chronic pain    Pain Onset More than a month ago    Pain Frequency Intermittent                [facility] PT Assessment - 02/06/21 0001       Assessment   Medical Diagnosis Weakness bilat hips; lumbar stenosis    Referring Provider (PT) Dr Dianah Field    Onset Date/Surgical Date 01/08/21    Hand Dominance Right    Next MD Visit 03/14/21    Prior Therapy here for neck 2/21      Strength   Right Hip Flexion 5/5    Right Hip Extension 4+/5    Right Hip ABduction 4+/5    Left Hip Flexion 5/5    Left Hip Extension 4+/5    Left Hip ABduction 4+/5      Flexibility   Piriformis was tight Rt > Lt; now ~ equal      Palpation   Patella mobility laterally tracking Lt patella with tightness in the lateral quad and insertion of quad to lateral superior patellar pole     Palpation comment improving muscular tightness Rt > Lt piriformis; glut med; Lt QL; Lt psoas      Ambulation/Gait   Gait Comments decreasing forward flexed posture with verbal cues; no asymmetry noted                           [facility] Adult PT Treatment/Exercise - 02/06/21 0001       Lumbar Exercises: Stretches   Hip Flexor Stretch Left;Right;2 reps;30 seconds    Hip Flexor Stretch Limitations Thomas position supine    Prone on Elbows Stretch 1 rep;60 seconds    Quad Stretch Right;Left;3 reps;30 seconds    Piriformis Stretch Right;2 reps;30 seconds    Piriformis Stretch Limitations supine travell - foot across opposite thigh resting on surface repeated with foot unsupported, knee closer to chest      Lumbar Exercises: Standing   Row Strengthening;Both;Theraband    Theraband Level (Row) Level 4 (Blue)    Row Limitations reactive isometric    Other Standing Lumbar Exercises antirotation green TB x 10 reps each side VC to engage core    Other Standing Lumbar Exercises monster walks green TB crossed under feet ~ 10 feet x 4 reps each side      Lumbar Exercises: Prone   Other Prone Lumbar Exercises prone series arms at side thoracic and cervical extension 5 sec hold x 5; airplane difficult with Lt shoulder; goal post difficult with Lt                     PT Education - 02/06/21 1538     Education Details HEP    Person(s) Educated Patient    Methods Explanation;Demonstration;Tactile cues;Verbal cues    Comprehension Verbalized understanding;Returned demonstration;Verbal cues required;Tactile cues required              PT Short Term Goals - 01/19/21 1258       PT SHORT TERM GOAL #1   Title Patient independent with initial HEP    Time 4    Period Weeks    Status New    Target Date 02/16/21      PT SHORT TERM GOAL #2   Title Patient demonstrates improved symmetry in LE flexibility    Time 4    Period Weeks  Status New    Target Date  02/17/21      PT SHORT TERM GOAL #3   Title Patient demonstrate/verbalize understanding of improve posture and alignment with sitting/standing/walking    Time 4    Period Weeks    Status New    Target Date 02/17/21               PT Long Term Goals - 01/19/21 1300       PT LONG TERM GOAL #1   Title 5/5 strength bilat LE's    Time 8    Period Weeks    Status New    Target Date 03/16/21      PT LONG TERM GOAL #2   Title Improve core strength and stability in lower and upper core with patient to demonstrate impeove upright posture and alignment with posterior shoulder girdle musculature engaged    Baseline -    Time 8    Period Weeks    Target Date 03/16/21      PT LONG TERM GOAL #3   Title Patient reports no episodes of asymmetry with running    Time 8    Period Weeks    Status New    Target Date 03/16/21      PT LONG TERM GOAL #4   Title Independent inHEP    Baseline -    Time 8    Period Weeks    Status New    Target Date 03/16/21      PT LONG TERM GOAL #5   Title Improve functional limitation score to 80    Time 8    Period Weeks    Status New    Target Date 03/16/21                   Plan - 02/06/21 1442     Clinical Impression Statement Patient demonstrates good gains in ROM/mobility and increased strength in bilat hips. She has Lt knee discomfort, which has increased with addition of LE strengthening. She is unable to tolerate sit to stand and squats due to pain. Patient has lateral tracking patella and muscular tightness in the lateral quad. Trial of taping not successful.    Rehab Potential Good    PT Frequency 2x / week    PT Duration 8 weeks    PT Treatment/Interventions ADLs/Self Care Home Management;Aquatic Therapy;Cryotherapy;Electrical Stimulation;Iontophoresis 4mg /ml Dexamethasone;Moist Heat;Ultrasound;Gait training;Stair training;Functional mobility training;Therapeutic activities;Therapeutic exercise;Balance training;Neuromuscular  re-education;Patient/family education;Manual techniques;Passive range of motion;Dry needling;Taping    PT Next Visit Plan review HEP; progress with exercises working hips and core; manual work v DN Rt posterior hip/Lt psoas; myofacial ball release work; modalities as indicated - continue DN posterior Rt hip, anterior Lt hip, lumbar spine paraspinals as indicated - check antirotation TB    PT Home Exercise Plan [email or web address]    Consulted and Agree with Plan of Care Patient             Patient will benefit from skilled therapeutic intervention in order to improve the following deficits and impairments:     Visit Diagnosis: Weakness generalized  Other symptoms and signs involving the musculoskeletal system  Abnormal posture  Radiculopathy, cervical region     Problem List Patient Active Problem List   Diagnosis Date Noted   Weakness of hip 01/17/2021   Hearing loss due to cerumen impaction, right 01/18/2020   Vaginal irritation 01/18/2020   Osteoporosis 03/10/2019   Genetic testing 11/06/2018   Tick bite 11/03/2018  Family history of breast cancer    Family history of colon cancer    Family history of stomach cancer    Family history of brain cancer    Family history of kidney cancer    Family history of oral cancer    Family history of cancer    Malignant neoplasm of upper-inner quadrant of right breast in female, estrogen receptor positive ([place]) 09/29/2018   Plantar fasciitis, right 08/15/2018   Left leg pain 11/07/2015   Myalgia 11/07/2015   Exercise counseling 03/01/2015   Dysfunction of right rotator cuff 12/14/2014   Skin tag 12/14/2014   Tongue lesion 06/14/2014   Primary osteoarthritis of both knees 05/17/2014   Annual physical exam 08/20/2013   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

## 2021-02-06 NOTE — Patient Instructions (Signed)
Access Code: [email or web address] URL: [email or web address] Date: 01/30/2021 Prepared by: Gillermo Murdoch  Exercises Prone Quadriceps Stretch with Strap - 2 x daily - 7 x weekly - 1 sets - 3 reps - 30 sec hold Hooklying Hamstring Stretch with Strap - 2 x daily - 7 x weekly - 1 sets - 3 reps - 30 sec hold Supine Piriformis Stretch with Leg Straight - 2 x daily - 7 x weekly - 1 sets - 3 reps - 30 sec hold Hip Flexor Stretch at Edge of Bed - 2 x daily - 7 x weekly - 1 sets - 3 reps - 30 sec hold Supine Transversus Abdominis Bracing with Pelvic Floor Contraction - 2 x daily - 7 x weekly - 1 sets - 10 reps - 10sec hold Sidelying Hip Abduction - 2 x daily - 7 x weekly - 2-3 sets - 10 reps - 3-5 sec hold Shoulder External Rotation and Scapular Retraction with Resistance - 2 x daily - 7 x weekly - 3 sets - 10 reps - 3 hold Scapular Retraction with Resistance - 2 x daily - 7 x weekly - 3 sets - 10 reps - 3 hold Doorway Pec Stretch at 60 Degrees Abduction - 3 x daily - 7 x weekly - 1 sets - 3 reps Doorway Pec Stretch at 90 Degrees Abduction - 3 x daily - 7 x weekly - 1 sets - 3 reps - 30 seconds hold Doorway Pec Stretch at 120 Degrees Abduction - 3 x daily - 7 x weekly - 1 sets - 3 reps - 30 second hold hold Side Stepping with Resistance at Thighs and Counter Support Standing Shoulder Row Reactive Isometric - 5-10 reps - 60 sec hold Anti-Rotation Lateral Stepping with Press - 2 x daily - 7 x weekly - 1-2 sets - 10 reps - 2-3 sec hold Prone Scapular Retraction - 2 x daily - 7 x weekly - 1 sets - 5-10 reps - 3-5 sec hold Prone Scapular Retraction Arms at Side - 2 x daily - 7 x weekly - 1 sets - 5-10 reps - 3-5 sec hold Prone Scapular Retraction Y - 2 x daily - 7 x weekly - 1 sets - 3-5 reps - 3-5 sec hold

## 2021-02-08 ENCOUNTER — Ambulatory Visit (INDEPENDENT_AMBULATORY_CARE_PROVIDER_SITE_OTHER): Payer: Medicare Other

## 2021-02-08 ENCOUNTER — Ambulatory Visit (INDEPENDENT_AMBULATORY_CARE_PROVIDER_SITE_OTHER): Payer: Medicare Other | Admitting: Sports Medicine

## 2021-02-08 ENCOUNTER — Encounter: Payer: Medicare Other | Admitting: Rehabilitative and Restorative Service Providers"

## 2021-02-08 ENCOUNTER — Other Ambulatory Visit: Payer: Self-pay

## 2021-02-08 DIAGNOSIS — M25562 Pain in left knee: Secondary | ICD-10-CM

## 2021-02-08 DIAGNOSIS — M170 Bilateral primary osteoarthritis of knee: Secondary | ICD-10-CM | POA: Diagnosis not present

## 2021-02-08 DIAGNOSIS — M25561 Pain in right knee: Secondary | ICD-10-CM | POA: Diagnosis not present

## 2021-02-08 DIAGNOSIS — E538 Deficiency of other specified B group vitamins: Secondary | ICD-10-CM | POA: Diagnosis not present

## 2021-02-08 DIAGNOSIS — M25462 Effusion, left knee: Secondary | ICD-10-CM | POA: Diagnosis not present

## 2021-02-08 DIAGNOSIS — M1711 Unilateral primary osteoarthritis, right knee: Secondary | ICD-10-CM | POA: Diagnosis not present

## 2021-02-08 MED ORDER — VITAMIN B-12 1000 MCG PO TABS: 1000.0000 ug | ORAL_TABLET | Freq: Every day | ORAL | 3 refills | Status: AC

## 2021-02-08 NOTE — Assessment & Plan Note (Signed)
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 I would like updated x-rays of her knees, if no better in 6 weeks we will do an injection.

## 2021-02-08 NOTE — Progress Notes (Signed)
    Procedures performed today:    None.  Independent interpretation of notes and tests performed by another provider:   None.  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 I would like updated x-rays of her knees, if no better in 6 weeks we will do an injection.  Deficiency of vitamin B12 Ebony Hail also had a B12 level at the bottom of the normal range, homocystine levels were elevated suggesting that this may actually represent a functional deficiency even though the absolute level was normal. We will add B12 supplementation, cyanocobalamin 1000 mcg daily. We may consider rechecking her homocystine levels at a future visit.    ___________________________________________ Gwen Her. Dianah Field, M.D., ABFM., CAQSM. Primary Care and [place] Instructor of [place] [employer] [facility] [employer]

## 2021-02-08 NOTE — Assessment & Plan Note (Signed)
Joan Lopez also had a B12 level at the bottom of the normal range, homocystine levels were elevated suggesting that this may actually represent a functional deficiency even though the absolute level was normal. We will add B12 supplementation, cyanocobalamin 1000 mcg daily. We may consider rechecking her homocystine levels at a future visit.

## 2021-02-10 ENCOUNTER — Encounter: Payer: Self-pay | Admitting: Rehabilitative and Restorative Service Providers"

## 2021-02-10 ENCOUNTER — Other Ambulatory Visit: Payer: Self-pay

## 2021-02-10 ENCOUNTER — Ambulatory Visit (INDEPENDENT_AMBULATORY_CARE_PROVIDER_SITE_OTHER): Payer: Medicare Other | Admitting: Rehabilitative and Restorative Service Providers"

## 2021-02-10 DIAGNOSIS — R29898 Other symptoms and signs involving the musculoskeletal system: Secondary | ICD-10-CM

## 2021-02-10 DIAGNOSIS — R293 Abnormal posture: Secondary | ICD-10-CM | POA: Diagnosis not present

## 2021-02-10 DIAGNOSIS — M5412 Radiculopathy, cervical region: Secondary | ICD-10-CM | POA: Diagnosis not present

## 2021-02-10 DIAGNOSIS — R531 Weakness: Secondary | ICD-10-CM | POA: Diagnosis not present

## 2021-02-10 NOTE — Therapy (Signed)
[employer] [place], [place], [address] Phone: [phone]   Fax:  [phone]  Physical Therapy Treatment  Patient Details  Name: Joan Lopez MRN: [id number] Date of Birth: 09-06-1950 Referring Provider (PT): Dr Dianah Field   Encounter Date: 02/10/2021   PT End of Session - 02/10/21 0857     Visit Number 7    Number of Visits 12    Date for PT Re-Evaluation 03/16/21    PT Start Time 0848    PT Stop Time 0930    PT Time Calculation (min) 42 min    Activity Tolerance Patient tolerated treatment well             Past Medical History:  Diagnosis Date   Abnormal LFTs 10/29/2012   Acquired bilateral renal cysts    Arthritis    Bilateral bunions    Constipation 10/16/2011   DDD (degenerative disc disease)    spine w narrowing   Dehydration, mild 12/26/2011   Diverticulitis    Exercise counseling 11/14/2011   Family history of brain cancer    Family history of breast cancer    Family history of cancer    Family history of colon cancer    Family history of kidney cancer    Family history of oral cancer    Family history of stomach cancer    Fuch's endothelial dystrophy    Guttate psoriasis    Hematuria 08/24/2011   Hemorrhoids    History of chicken pox    Hyperlipidemia    Kidney stones    Lipoma of arm 07/13/2012   Lipoma of arm 10/07/2012   Left    Mass of arm 07/13/2012   Has had them in past     Personal history of radiation therapy    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   Post-menopausal bleeding 10/08/2011   Reflux 04/17/2012   Renal insufficiency 01/11/2013   Thrombocytopenia ([place]) 09/27/2011    Past Surgical History:  Procedure Laterality Date   APPENDECTOMY  1986   BREAST LUMPECTOMY Right 10/2018   BREAST LUMPECTOMY WITH RADIOACTIVE SEED AND SENTINEL LYMPH NODE BIOPSY Right 10/24/2018   Procedure: RIGHT BREAST LUMPECTOMY WITH RADIOACTIVE SEED AND RIGHT AXILLARY SENTINEL LYMPH NODE  BIOPSY, RIGHT BLUE DYE INJECTION;  Surgeon: Fanny Skates, MD;  Location: [facility];  Service: General;  Laterality: Right;  [place]     COLONOSCOPY  2005   (records here)[place] - diverticulosis and hemorrhoids   CORNEAL TRANSPLANT  2013   CYSTOSCOPY WITH RETROGRADE PYELOGRAM, URETEROSCOPY AND STENT PLACEMENT Right 10/22/2013   Procedure: [place], URETEROSCOPY ;  Surgeon: Raynelle Bring, MD;  Location: [facility];  Service: Urology;  Laterality: Right;   LIPOMA EXCISION  2014   x 3, both arms   TONSILLECTOMY     possible adenoids   TOTAL ABDOMINAL HYSTERECTOMY  1991   for fibroids   TUBAL LIGATION     WRIST FRACTURE SURGERY     right, titanium plate and pins    There were no vitals filed for this visit.   Subjective Assessment - 02/10/21 0857     Subjective Saw Dr Darene Lamer Wednesday and he evaluated knees and xrayed both knees. He feels she has arthritic changes in knees and wants to add PT for medial quad strengthening. Patient does not want to try DN for lateral quad. Hesitant to try taping at home for fear  she will not tape correctly. [name] done all her exercises today. [name] does report that she exercised and ran yesterday and felt good.    Currently in Pain? No/denies    Pain Score 0-No pain                               [facility] Adult PT Treatment/Exercise - 02/10/21 0001       Self-Care   Other Self-Care Comments  massage stick lateral quad to quad bilat      Lumbar Exercises: Standing   Heel Raises Limitations SLS 20-30 sec x 3 reps each side min UE support for balance as needed      Lumbar Exercises: Seated   Other Seated Lumbar Exercises thoracic extension and rotation sitting with coregeous ball btn shoulder blades    Other Seated Lumbar Exercises ant/post pelvic tilt in sitting after supine exercises to decrease LB discomfort and tightness      Lumbar Exercises: Supine    AB Set Limitations working on core in standing, walking, running, sitting in car    Bridge with [employer] 10 reps    Bridge with [employer] Limitations 10 sec hold    Straight Leg Raise 10 reps    Straight Leg Raises Limitations in ER 10 sec hold    Other Supine Lumbar Exercises adductor squeeze w/ coregeous ball 10 sec hold x 10 reps                     PT Education - 02/10/21 0924     Education Details HEP    Person(s) Educated Patient    Methods Explanation;Demonstration;Tactile cues;Verbal cues;Handout    Comprehension Verbalized understanding;Returned demonstration;Verbal cues required;Tactile cues required              PT Short Term Goals - 01/19/21 1258       PT SHORT TERM GOAL #1   Title Patient independent with initial HEP    Time 4    Period Weeks    Status New    Target Date 02/16/21      PT SHORT TERM GOAL #2   Title Patient demonstrates improved symmetry in LE flexibility    Time 4    Period Weeks    Status New    Target Date 02/17/21      PT SHORT TERM GOAL #3   Title Patient demonstrate/verbalize understanding of improve posture and alignment with sitting/standing/walking    Time 4    Period Weeks    Status New    Target Date 02/17/21               PT Long Term Goals - 01/19/21 1300       PT LONG TERM GOAL #1   Title 5/5 strength bilat LE's    Time 8    Period Weeks    Status New    Target Date 03/16/21      PT LONG TERM GOAL #2   Title Improve core strength and stability in lower and upper core with patient to demonstrate impeove upright posture and alignment with posterior shoulder girdle musculature engaged    Baseline -    Time 8    Period Weeks    Target Date 03/16/21      PT LONG TERM GOAL #3   Title Patient reports no episodes of asymmetry with running    Time 8    Period  Weeks    Status New    Target Date 03/16/21      PT LONG TERM GOAL #4   Title Independent inHEP    Baseline -    Time 8    Period  Weeks    Status New    Target Date 03/16/21      PT LONG TERM GOAL #5   Title Improve functional limitation score to 80    Time 8    Period Weeks    Status New    Target Date 03/16/21                   Plan - 02/10/21 9629     Clinical Impression Statement MD diagnosed knee pain as artiritic pain and added strengthening for medial quad and myofacial release for lateral quad with massage stick. Continued education re- alignment of patella and selective stretching and strengthening for LE's as well as core stabilization.    Rehab Potential Good    PT Frequency 2x / week    PT Duration 8 weeks    PT Treatment/Interventions ADLs/Self Care Home Management;Aquatic Therapy;Cryotherapy;Electrical Stimulation;Iontophoresis 4mg /ml Dexamethasone;Moist Heat;Ultrasound;Gait training;Stair training;Functional mobility training;Therapeutic activities;Therapeutic exercise;Balance training;Neuromuscular re-education;Patient/family education;Manual techniques;Passive range of motion;Dry needling;Taping    PT Next Visit Plan review HEP; progress with exercises working hips and core; add strengthening for quads focus on medial quad; myofacial release work; modalities as indicated - does not like DN    PT Home Exercise Plan [email or web address]    Consulted and Agree with Plan of Care Patient             Patient will benefit from skilled therapeutic intervention in order to improve the following deficits and impairments:     Visit Diagnosis: Weakness generalized  Other symptoms and signs involving the musculoskeletal system  Abnormal posture  Radiculopathy, cervical region     Problem List Patient Active Problem List   Diagnosis Date Noted   Deficiency of vitamin B12 02/08/2021   Weakness of hip 01/17/2021   Hearing loss due to cerumen impaction, right 01/18/2020   Vaginal irritation 01/18/2020   Osteoporosis 03/10/2019   Genetic testing 11/06/2018   Tick bite 11/03/2018   Family  history of breast cancer    Family history of colon cancer    Family history of stomach cancer    Family history of brain cancer    Family history of kidney cancer    Family history of oral cancer    Family history of cancer    Malignant neoplasm of upper-inner quadrant of right breast in female, estrogen receptor positive ([place]) 09/29/2018   Plantar fasciitis, right 08/15/2018   Left leg pain 11/07/2015   Myalgia 11/07/2015   Exercise counseling 03/01/2015   Dysfunction of right rotator cuff 12/14/2014   Skin tag 12/14/2014   Tongue lesion 06/14/2014   Primary osteoarthritis of both knees 05/17/2014   Annual physical exam 08/20/2013   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 [place], [place], [address] Phone: [phone]   Fax:  [phone]  Name: Joan Lopez MRN: [id number] Date of Birth: 1950-12-06

## 2021-02-10 NOTE — Patient Instructions (Signed)
Access Code: [email or web address] URL: [email or web address] Date: 02/10/2021 Prepared by: Gillermo Murdoch  Exercises Prone Quadriceps Stretch with Strap - 2 x daily - 7 x weekly - 1 sets - 3 reps - 30 sec hold Hooklying Hamstring Stretch with Strap - 2 x daily - 7 x weekly - 1 sets - 3 reps - 30 sec hold Supine Piriformis Stretch with Leg Straight - 2 x daily - 7 x weekly - 1 sets - 3 reps - 30 sec hold Hip Flexor Stretch at Edge of Bed - 2 x daily - 7 x weekly - 1 sets - 3 reps - 30 sec hold Supine Transversus Abdominis Bracing with Pelvic Floor Contraction - 2 x daily - 7 x weekly - 1 sets - 10 reps - 10sec hold Shoulder External Rotation and Scapular Retraction with Resistance - 2 x daily - 7 x weekly - 3 sets - 10 reps - 3 hold Doorway Pec Stretch at 60 Degrees Abduction - 3 x daily - 7 x weekly - 1 sets - 3 reps Doorway Pec Stretch at 90 Degrees Abduction - 3 x daily - 7 x weekly - 1 sets - 3 reps - 30 seconds hold Doorway Pec Stretch at 120 Degrees Abduction - 3 x daily - 7 x weekly - 1 sets - 3 reps - 30 second hold hold Side Stepping with Resistance at Thighs and Counter Support Standing Shoulder Row Reactive Isometric - 5-10 reps - 60 sec hold Anti-Rotation Lateral Stepping with Press - 2 x daily - 7 x weekly - 1-2 sets - 10 reps - 2-3 sec hold Supine Hip Adduction Isometric with Ball - 2 x daily - 7 x weekly - 1 sets - 10 reps - 10 sec hold Supine Bridge with Mini Swiss Ball Between Knees - 1 x daily - 7 x weekly - 1-2 sets - 10 reps - 5-10 sec hold Straight Leg Raise with External Rotation - 2 x daily - 7 x weekly - 1-2 sets - 10 reps - 3-5 sec hold Single Leg Stance - 2 x daily - 7 x weekly - 2 sets - 5 reps - 20-30 sec hold

## 2021-02-14 ENCOUNTER — Ambulatory Visit (INDEPENDENT_AMBULATORY_CARE_PROVIDER_SITE_OTHER): Payer: Medicare Other | Admitting: Rehabilitative and Restorative Service Providers"

## 2021-02-14 ENCOUNTER — Encounter: Payer: Self-pay | Admitting: Rehabilitative and Restorative Service Providers"

## 2021-02-14 ENCOUNTER — Other Ambulatory Visit: Payer: Self-pay

## 2021-02-14 DIAGNOSIS — R293 Abnormal posture: Secondary | ICD-10-CM

## 2021-02-14 DIAGNOSIS — M5412 Radiculopathy, cervical region: Secondary | ICD-10-CM | POA: Diagnosis not present

## 2021-02-14 DIAGNOSIS — R29898 Other symptoms and signs involving the musculoskeletal system: Secondary | ICD-10-CM | POA: Diagnosis not present

## 2021-02-14 DIAGNOSIS — R531 Weakness: Secondary | ICD-10-CM

## 2021-02-14 NOTE — Patient Instructions (Signed)
Access Code: [email or web address] URL: [email or web address] Date: 02/14/2021 Prepared by: Gillermo Murdoch  Exercises Prone Quadriceps Stretch with Strap - 2 x daily - 7 x weekly - 1 sets - 3 reps - 30 sec hold Hooklying Hamstring Stretch with Strap - 2 x daily - 7 x weekly - 1 sets - 3 reps - 30 sec hold Supine Piriformis Stretch with Leg Straight - 2 x daily - 7 x weekly - 1 sets - 3 reps - 30 sec hold Hip Flexor Stretch at Edge of Bed - 2 x daily - 7 x weekly - 1 sets - 3 reps - 30 sec hold Supine Transversus Abdominis Bracing with Pelvic Floor Contraction - 2 x daily - 7 x weekly - 1 sets - 10 reps - 10sec hold Shoulder External Rotation and Scapular Retraction with Resistance - 2 x daily - 7 x weekly - 3 sets - 10 reps - 3 hold Doorway Pec Stretch at 60 Degrees Abduction - 3 x daily - 7 x weekly - 1 sets - 3 reps Doorway Pec Stretch at 90 Degrees Abduction - 3 x daily - 7 x weekly - 1 sets - 3 reps - 30 seconds hold Doorway Pec Stretch at 120 Degrees Abduction - 3 x daily - 7 x weekly - 1 sets - 3 reps - 30 second hold hold Side Stepping with Resistance at Thighs and Counter Support Standing Shoulder Row Reactive Isometric - 5-10 reps - 60 sec hold Anti-Rotation Lateral Stepping with Press - 2 x daily - 7 x weekly - 1-2 sets - 10 reps - 2-3 sec hold Supine Hip Adduction Isometric with Ball - 2 x daily - 7 x weekly - 1 sets - 10 reps - 10 sec hold Supine Bridge with Mini Swiss Ball Between Knees - 1 x daily - 7 x weekly - 1-2 sets - 10 reps - 5-10 sec hold Straight Leg Raise with External Rotation - 2 x daily - 7 x weekly - 1-2 sets - 10 reps - 3-5 sec hold Single Leg Stance - 1 x daily - 7 x weekly - 2 sets - 5 reps - 20-30 sec hold Seated Thoracic Lumbar Extension with Pectoralis Stretch - 1 x daily - 7 x weekly - 1 sets - 5-8 reps - 10 sec hold Seated Thoracic Extension and Rotation with Reach - 1 x daily - 7 x weekly - 1 sets - 3 reps - 10 sec hold Thoracic Y on Foam Roll - 1 x daily - 7  x weekly - 1 sets - 1 reps - 1-2 min hold Prone Scapular Retraction - 2 x daily - 7 x weekly - 1 sets - 5-10 reps - 3-5 sec hold Prone Scapular Retraction Arms at Side - 2 x daily - 7 x weekly - 1 sets - 5-10 reps - 3-5 sec hold Prone W Scapular Retraction - 2 x daily - 7 x weekly - 1 sets - 5-10 reps - 3-5 sec hold Prone Scapular Retraction Y - 2 x daily - 7 x weekly - 1 sets - 3-5 reps - 5-10 sec hold Prone Scapular Retraction in Flexion - 2 x daily - 7 x weekly - 1 sets - 5-10 reps - 3-5 sec hold Standing Plank on Wall - 2 x daily - 7 x weekly - 1 sets - 3 reps - 30 sec hold Anti-Rotation Lateral Stepping with Press - 2 x daily - 7 x weekly - 1-2 sets - 10 reps - 2-3 sec hold

## 2021-02-14 NOTE — Therapy (Signed)
[place], [place], [address] Phone: [phone]   Fax:  [phone]  Physical Therapy Treatment  Patient Details  Name: Joan Lopez MRN: [id number] Date of Birth: 1950/06/04 Referring Provider (PT): Dr Dianah Field   Encounter Date: 02/14/2021   PT End of Session - 02/14/21 1106     Visit Number 8    Number of Visits 12    Date for PT Re-Evaluation 03/16/21    PT Start Time 1105    PT Stop Time 2979    PT Time Calculation (min) 40 min             Past Medical History:  Diagnosis Date   Abnormal LFTs 10/29/2012   Acquired bilateral renal cysts    Arthritis    Bilateral bunions    Constipation 10/16/2011   DDD (degenerative disc disease)    spine w narrowing   Dehydration, mild 12/26/2011   Diverticulitis    Exercise counseling 11/14/2011   Family history of brain cancer    Family history of breast cancer    Family history of cancer    Family history of colon cancer    Family history of kidney cancer    Family history of oral cancer    Family history of stomach cancer    Fuch's endothelial dystrophy    Guttate psoriasis    Hematuria 08/24/2011   Hemorrhoids    History of chicken pox    Hyperlipidemia    Kidney stones    Lipoma of arm 07/13/2012   Lipoma of arm 10/07/2012   Left    Mass of arm 07/13/2012   Has had them in past     Personal history of radiation therapy    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   Post-menopausal bleeding 10/08/2011   Reflux 04/17/2012   Renal insufficiency 01/11/2013   Thrombocytopenia ([place]) 09/27/2011    Past Surgical History:  Procedure Laterality Date   APPENDECTOMY  1986   BREAST LUMPECTOMY Right 10/2018   BREAST LUMPECTOMY WITH RADIOACTIVE SEED AND SENTINEL LYMPH NODE BIOPSY Right 10/24/2018   Procedure: RIGHT BREAST LUMPECTOMY WITH RADIOACTIVE SEED AND RIGHT AXILLARY SENTINEL LYMPH NODE BIOPSY, RIGHT BLUE DYE INJECTION;  Surgeon: Fanny Skates, MD;  Location: [facility];  Service: General;  Laterality: Right;  [place]     COLONOSCOPY  2005   (records here)[place] - diverticulosis and hemorrhoids   CORNEAL TRANSPLANT  2013   CYSTOSCOPY WITH RETROGRADE PYELOGRAM, URETEROSCOPY AND STENT PLACEMENT Right 10/22/2013   Procedure: CYSTOSCOPY WITH RETROGRADE PYELOGRAM, URETEROSCOPY ;  Surgeon: Raynelle Bring, MD;  [facility];  Service: Urology;  Laterality: Right;   LIPOMA EXCISION  2014   x 3, both arms   TONSILLECTOMY     possible adenoids   TOTAL ABDOMINAL HYSTERECTOMY  1991   for fibroids   TUBAL LIGATION     WRIST FRACTURE SURGERY     right, titanium plate and pins    There were no vitals filed for this visit.   Subjective Assessment - 02/14/21 1114     Subjective Working on exercises but has not had a chance to do the new exercises from last week.    Currently in Pain? No/denies    Pain Score 0-No pain                [facility] PT Assessment - 02/14/21 0001  Assessment   Medical Diagnosis Weakness bilat hips; lumbar stenosis    Referring Provider (PT) Dr Dianah Field    Onset Date/Surgical Date 01/08/21    Hand Dominance Right    Next MD Visit 03/14/21    Prior Therapy here for neck 2/21      Flexibility   Hamstrings tight Rt > Lt    Quadriceps tight Rt > Lt    Piriformis was tight Rt > Lt; now ~ equal                           [facility] Adult PT Treatment/Exercise - 02/14/21 0001       Self-Care   Other Self-Care Comments  massage stick lateral quad to quad bilat      Lumbar Exercises: Standing   Heel Raises Limitations SLS 20-30 sec x 3 reps each side min UE support for balance as needed    Other Standing Lumbar Exercises antirotation green TB x 10 reps each side VC to engage core added flexion with TB in tight position      Lumbar Exercises: Seated   Other Seated Lumbar Exercises thoracic extension and rotation  sitting with coregeous ball btn shoulder blades      Lumbar Exercises: Supine   AB Set Limitations working on core in standing, walking, running, sitting in car    Bridge with [employer] 10 reps    Bridge with [employer] Limitations 10 sec hold    Straight Leg Raise 10 reps    Straight Leg Raises Limitations in ER 10 sec hold    Other Supine Lumbar Exercises adductor squeeze w/ coregeous ball 10 sec hold x 10 reps      Lumbar Exercises: Prone   Other Prone Lumbar Exercises prone series arms at side thoracic and cervical extension 5 sec hold x 5; airplane difficult with Lt shoulder; goal post review                     PT Education - 02/14/21 1142     Education Details HEP    Person(s) Educated Patient    Methods Explanation;Demonstration;Tactile cues;Verbal cues;Handout    Comprehension Verbalized understanding;Returned demonstration;Verbal cues required;Tactile cues required              PT Short Term Goals - 01/19/21 1258       PT SHORT TERM GOAL #1   Title Patient independent with initial HEP    Time 4    Period Weeks    Status New    Target Date 02/16/21      PT SHORT TERM GOAL #2   Title Patient demonstrates improved symmetry in LE flexibility    Time 4    Period Weeks    Status New    Target Date 02/17/21      PT SHORT TERM GOAL #3   Title Patient demonstrate/verbalize understanding of improve posture and alignment with sitting/standing/walking    Time 4    Period Weeks    Status New    Target Date 02/17/21               PT Long Term Goals - 01/19/21 1300       PT LONG TERM GOAL #1   Title 5/5 strength bilat LE's    Time 8    Period Weeks    Status New    Target Date 03/16/21      PT LONG TERM GOAL #  2   Title Improve core strength and stability in lower and upper core with patient to demonstrate impeove upright posture and alignment with posterior shoulder girdle musculature engaged    Baseline -    Time 8    Period Weeks     Target Date 03/16/21      PT LONG TERM GOAL #3   Title Patient reports no episodes of asymmetry with running    Time 8    Period Weeks    Status New    Target Date 03/16/21      PT LONG TERM GOAL #4   Title Independent inHEP    Baseline -    Time 8    Period Weeks    Status New    Target Date 03/16/21      PT LONG TERM GOAL #5   Title Improve functional limitation score to 80    Time 8    Period Weeks    Status New    Target Date 03/16/21                   Plan - 02/14/21 1122     Clinical Impression Statement Reviewed HEP and added variation of antirotation exercise. Continued education re-posture and alignment.    Rehab Potential Good    PT Frequency 2x / week    PT Duration 8 weeks    PT Treatment/Interventions ADLs/Self Care Home Management;Aquatic Therapy;Cryotherapy;Electrical Stimulation;Iontophoresis 4mg /ml Dexamethasone;Moist Heat;Ultrasound;Gait training;Stair training;Functional mobility training;Therapeutic activities;Therapeutic exercise;Balance training;Neuromuscular re-education;Patient/family education;Manual techniques;Passive range of motion;Dry needling;Taping    PT Next Visit Plan review HEP; progress with exercises working hips and core; add strengthening for quads focus on medial quad; myofacial release work; modalities as indicated - does not like DN    PT Home Exercise Plan [email or web address]    Consulted and Agree with Plan of Care Patient             Patient will benefit from skilled therapeutic intervention in order to improve the following deficits and impairments:     Visit Diagnosis: Weakness generalized  Other symptoms and signs involving the musculoskeletal system  Abnormal posture  Radiculopathy, cervical region     Problem List Patient Active Problem List   Diagnosis Date Noted   Deficiency of vitamin B12 02/08/2021   Weakness of hip 01/17/2021   Hearing loss due to cerumen impaction, right 01/18/2020   Vaginal  irritation 01/18/2020   Osteoporosis 03/10/2019   Genetic testing 11/06/2018   Tick bite 11/03/2018   Family history of breast cancer    Family history of colon cancer    Family history of stomach cancer    Family history of brain cancer    Family history of kidney cancer    Family history of oral cancer    Family history of cancer    Malignant neoplasm of upper-inner quadrant of right breast in female, estrogen receptor positive ([place]) 09/29/2018   Plantar fasciitis, right 08/15/2018   Left leg pain 11/07/2015   Myalgia 11/07/2015   Exercise counseling 03/01/2015   Dysfunction of right rotator cuff 12/14/2014   Skin tag 12/14/2014   Tongue lesion 06/14/2014   Primary osteoarthritis of both knees 05/17/2014   Annual physical exam 08/20/2013   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

## 2021-02-16 ENCOUNTER — Other Ambulatory Visit: Payer: Self-pay

## 2021-02-16 ENCOUNTER — Encounter: Payer: Self-pay | Admitting: Rehabilitative and Restorative Service Providers"

## 2021-02-16 ENCOUNTER — Ambulatory Visit (INDEPENDENT_AMBULATORY_CARE_PROVIDER_SITE_OTHER): Payer: Medicare Other | Admitting: Rehabilitative and Restorative Service Providers"

## 2021-02-16 DIAGNOSIS — R293 Abnormal posture: Secondary | ICD-10-CM

## 2021-02-16 DIAGNOSIS — M5412 Radiculopathy, cervical region: Secondary | ICD-10-CM

## 2021-02-16 DIAGNOSIS — R531 Weakness: Secondary | ICD-10-CM

## 2021-02-16 DIAGNOSIS — R29898 Other symptoms and signs involving the musculoskeletal system: Secondary | ICD-10-CM | POA: Diagnosis not present

## 2021-02-16 NOTE — Patient Instructions (Signed)
Access Code: [email or web address] URL: [email or web address] Date: 02/16/2021 Prepared by: Gillermo Murdoch  Exercises Prone Quadriceps Stretch with Strap - 2 x daily - 7 x weekly - 1 sets - 3 reps - 30 sec hold Hooklying Hamstring Stretch with Strap - 2 x daily - 7 x weekly - 1 sets - 3 reps - 30 sec hold Supine Piriformis Stretch with Leg Straight - 2 x daily - 7 x weekly - 1 sets - 3 reps - 30 sec hold Hip Flexor Stretch at Edge of Bed - 2 x daily - 7 x weekly - 1 sets - 3 reps - 30 sec hold Supine Transversus Abdominis Bracing with Pelvic Floor Contraction - 2 x daily - 7 x weekly - 1 sets - 10 reps - 10sec hold Shoulder External Rotation and Scapular Retraction with Resistance - 2 x daily - 7 x weekly - 3 sets - 10 reps - 3 hold Doorway Pec Stretch at 60 Degrees Abduction - 3 x daily - 7 x weekly - 1 sets - 3 reps Doorway Pec Stretch at 90 Degrees Abduction - 3 x daily - 7 x weekly - 1 sets - 3 reps - 30 seconds hold Doorway Pec Stretch at 120 Degrees Abduction - 3 x daily - 7 x weekly - 1 sets - 3 reps - 30 second hold hold Side Stepping with Resistance at Thighs and Counter Support Standing Shoulder Row Reactive Isometric - 5-10 reps - 60 sec hold Anti-Rotation Lateral Stepping with Press - 2 x daily - 7 x weekly - 1-2 sets - 10 reps - 2-3 sec hold Supine Hip Adduction Isometric with Ball - 2 x daily - 7 x weekly - 1 sets - 10 reps - 10 sec hold Supine Bridge with Mini Swiss Ball Between Knees - 1 x daily - 7 x weekly - 1-2 sets - 10 reps - 5-10 sec hold Straight Leg Raise with External Rotation - 2 x daily - 7 x weekly - 1-2 sets - 10 reps - 3-5 sec hold Single Leg Stance - 1 x daily - 7 x weekly - 2 sets - 5 reps - 20-30 sec hold Seated Thoracic Lumbar Extension with Pectoralis Stretch - 1 x daily - 7 x weekly - 1 sets - 5-8 reps - 10 sec hold Seated Thoracic Extension and Rotation with Reach - 1 x daily - 7 x weekly - 1 sets - 3 reps - 10 sec hold Thoracic Y on Foam Roll - 1 x daily - 7  x weekly - 1 sets - 1 reps - 1-2 min hold Prone Scapular Retraction - 2 x daily - 7 x weekly - 1 sets - 5-10 reps - 3-5 sec hold Prone Scapular Retraction Arms at Side - 2 x daily - 7 x weekly - 1 sets - 5-10 reps - 3-5 sec hold Prone W Scapular Retraction - 2 x daily - 7 x weekly - 1 sets - 5-10 reps - 3-5 sec hold Prone Scapular Retraction Y - 2 x daily - 7 x weekly - 1 sets - 3-5 reps - 5-10 sec hold Prone Scapular Retraction in Flexion - 2 x daily - 7 x weekly - 1 sets - 5-10 reps - 3-5 sec hold Standing Plank on Wall - 2 x daily - 7 x weekly - 1 sets - 3 reps - 30 sec hold Anti-Rotation Lateral Stepping with Press - 2 x daily - 7 x weekly - 1-2 sets - 10 reps - 2-3 sec hold  Wall Squat Hold with Ball - 1 x daily - 7 x weekly - 1-2 sets - 10 reps - 5-10 sec hold Step Up - 1 x daily - 7 x weekly - 1 sets - 10 reps - 2 sec hold Lateral Step Up - 1 x daily - 7 x weekly - 2 sets - 10 reps - 2 sec hold The Diver - 1 x daily - 7 x weekly - 1 sets - 10 reps - 2-3 sec hold

## 2021-02-16 NOTE — Therapy (Signed)
[facility] [place], [place], [address] Phone: [phone]   Fax:  [phone]  Physical Therapy Treatment  Patient Details  Name: Joan Lopez MRN: [id number] Date of Birth: Apr 20, 1950 Referring Provider (PT): Dr Dianah Field   Encounter Date: 02/16/2021   PT End of Session - 02/16/21 1450     Visit Number 9    Number of Visits 12    Date for PT Re-Evaluation 03/16/21    PT Start Time 3299    PT Stop Time 1530    PT Time Calculation (min) 43 min    Activity Tolerance Patient tolerated treatment well             Past Medical History:  Diagnosis Date   Abnormal LFTs 10/29/2012   Acquired bilateral renal cysts    Arthritis    Bilateral bunions    Constipation 10/16/2011   DDD (degenerative disc disease)    spine w narrowing   Dehydration, mild 12/26/2011   Diverticulitis    Exercise counseling 11/14/2011   Family history of brain cancer    Family history of breast cancer    Family history of cancer    Family history of colon cancer    Family history of kidney cancer    Family history of oral cancer    Family history of stomach cancer    Fuch's endothelial dystrophy    Guttate psoriasis    Hematuria 08/24/2011   Hemorrhoids    History of chicken pox    Hyperlipidemia    Kidney stones    Lipoma of arm 07/13/2012   Lipoma of arm 10/07/2012   Left    Mass of arm 07/13/2012   Has had them in past     Personal history of radiation therapy    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   Post-menopausal bleeding 10/08/2011   Reflux 04/17/2012   Renal insufficiency 01/11/2013   Thrombocytopenia ([place]) 09/27/2011    Past Surgical History:  Procedure Laterality Date   APPENDECTOMY  1986   BREAST LUMPECTOMY Right 10/2018   BREAST LUMPECTOMY WITH RADIOACTIVE SEED AND SENTINEL LYMPH NODE BIOPSY Right 10/24/2018   Procedure: RIGHT BREAST LUMPECTOMY WITH RADIOACTIVE SEED AND RIGHT AXILLARY SENTINEL LYMPH NODE  BIOPSY, RIGHT BLUE DYE INJECTION;  Surgeon: Fanny Skates, MD;  Location: [facility];  Service: General;  Laterality: Right;  [place]     COLONOSCOPY  2005   (records here)[place] - diverticulosis and hemorrhoids   CORNEAL TRANSPLANT  2013   CYSTOSCOPY WITH RETROGRADE PYELOGRAM, URETEROSCOPY AND STENT PLACEMENT Right 10/22/2013   Procedure: CYSTOSCOPY WITH RETROGRADE PYELOGRAM, URETEROSCOPY ;  Surgeon: Raynelle Bring, MD;  Location: [facility];  Service: Urology;  Laterality: Right;   LIPOMA EXCISION  2014   x 3, both arms   TONSILLECTOMY     possible adenoids   TOTAL ABDOMINAL HYSTERECTOMY  1991   for fibroids   TUBAL LIGATION     WRIST FRACTURE SURGERY     right, titanium plate and pins    There were no vitals filed for this visit.   Subjective Assessment - 02/16/21 1450     Subjective Has done her exercises everyday. Ordered ball and massage stick.    Currently in Pain? No/denies    Pain Score 0-No pain  [facility] Adult PT Treatment/Exercise - 02/16/21 0001       Lumbar Exercises: Stretches   Hip Flexor Stretch Left;Right;2 reps;30 seconds    Hip Flexor Stretch Limitations Thomas position supine    Piriformis Stretch Right;2 reps;30 seconds    Piriformis Stretch Limitations supine travell - foot across opposite thigh resting on surface repeated with foot unsupported, knee closer to chest      Lumbar Exercises: Standing   Heel Raises Limitations SLS 20-30 sec x 3 reps each side min UE support for balance as needed    Wall Slides 10 reps    Wall Slides Limitations 10 sec hold coregeous ball btn knees      Lumbar Exercises: Supine   Bridge with [employer] 10 reps    Bridge with [employer] Limitations 10 sec hold    Straight Leg Raise 10 reps    Straight Leg Raises Limitations in ER 10 sec hold    Other Supine Lumbar Exercises adductor squeeze w/ coregeous ball 10 sec  hold x 10 reps      Knee/Hip Exercises: Standing   Lateral Step Up Right;Left;10 reps;Hand Hold: 1;Step Height: 4"    Lateral Step Up Limitations heel tap    Forward Step Up Right;Left;10 reps;Hand Hold: 0;Step Height: 4"    Wall Squat 10 reps;10 seconds    Wall Squat Limitations with ball squeeze btn knees                     PT Education - 02/16/21 1518     Education Details HEP    Person(s) Educated Patient    Methods Explanation;Demonstration;Tactile cues;Verbal cues;Handout    Comprehension Verbalized understanding;Returned demonstration;Verbal cues required;Tactile cues required              PT Short Term Goals - 01/19/21 1258       PT SHORT TERM GOAL #1   Title Patient independent with initial HEP    Time 4    Period Weeks    Status New    Target Date 02/16/21      PT SHORT TERM GOAL #2   Title Patient demonstrates improved symmetry in LE flexibility    Time 4    Period Weeks    Status New    Target Date 02/17/21      PT SHORT TERM GOAL #3   Title Patient demonstrate/verbalize understanding of improve posture and alignment with sitting/standing/walking    Time 4    Period Weeks    Status New    Target Date 02/17/21               PT Long Term Goals - 01/19/21 1300       PT LONG TERM GOAL #1   Title 5/5 strength bilat LE's    Time 8    Period Weeks    Status New    Target Date 03/16/21      PT LONG TERM GOAL #2   Title Improve core strength and stability in lower and upper core with patient to demonstrate impeove upright posture and alignment with posterior shoulder girdle musculature engaged    Baseline -    Time 8    Period Weeks    Target Date 03/16/21      PT LONG TERM GOAL #3   Title Patient reports no episodes of asymmetry with running    Time 8    Period Weeks    Status New    Target Date  03/16/21      PT LONG TERM GOAL #4   Title Independent inHEP    Baseline -    Time 8    Period Weeks    Status New     Target Date 03/16/21      PT LONG TERM GOAL #5   Title Improve functional limitation score to 80    Time 8    Period Weeks    Status New    Target Date 03/16/21                   Plan - 02/16/21 1455     Clinical Impression Statement Continued with LE and core stabilization exercises, progressing with LE exercises as tolerated.    Rehab Potential Good    PT Frequency 2x / week    PT Duration 8 weeks    PT Treatment/Interventions ADLs/Self Care Home Management;Aquatic Therapy;Cryotherapy;Electrical Stimulation;Iontophoresis 4mg /ml Dexamethasone;Moist Heat;Ultrasound;Gait training;Stair training;Functional mobility training;Therapeutic activities;Therapeutic exercise;Balance training;Neuromuscular re-education;Patient/family education;Manual techniques;Passive range of motion;Dry needling;Taping    PT Next Visit Plan review HEP; progress with exercises working hips and core; add strengthening for quads focus on medial quad; myofacial release work; modalities as indicated - does not like DN    PT Home Exercise Plan [id number]    Consulted and Agree with Plan of Care Patient             Patient will benefit from skilled therapeutic intervention in order to improve the following deficits and impairments:     Visit Diagnosis: Weakness generalized  Other symptoms and signs involving the musculoskeletal system  Abnormal posture  Radiculopathy, cervical region     Problem List Patient Active Problem List   Diagnosis Date Noted   Deficiency of vitamin B12 02/08/2021   Weakness of hip 01/17/2021   Hearing loss due to cerumen impaction, right 01/18/2020   Vaginal irritation 01/18/2020   Osteoporosis 03/10/2019   Genetic testing 11/06/2018   Tick bite 11/03/2018   Family history of breast cancer    Family history of colon cancer    Family history of stomach cancer    Family history of brain cancer    Family history of kidney cancer    Family history of oral cancer     Family history of cancer    Malignant neoplasm of upper-inner quadrant of right breast in female, estrogen receptor positive ([place]) 09/29/2018   Plantar fasciitis, right 08/15/2018   Left leg pain 11/07/2015   Myalgia 11/07/2015   Exercise counseling 03/01/2015   Dysfunction of right rotator cuff 12/14/2014   Skin tag 12/14/2014   Tongue lesion 06/14/2014   Primary osteoarthritis of both knees 05/17/2014   Annual physical exam 08/20/2013   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

## 2021-02-20 ENCOUNTER — Ambulatory Visit (INDEPENDENT_AMBULATORY_CARE_PROVIDER_SITE_OTHER): Payer: Medicare Other | Admitting: Rehabilitative and Restorative Service Providers"

## 2021-02-20 ENCOUNTER — Encounter: Payer: Self-pay | Admitting: Rehabilitative and Restorative Service Providers"

## 2021-02-20 ENCOUNTER — Other Ambulatory Visit: Payer: Self-pay

## 2021-02-20 DIAGNOSIS — R29898 Other symptoms and signs involving the musculoskeletal system: Secondary | ICD-10-CM | POA: Diagnosis not present

## 2021-02-20 DIAGNOSIS — R531 Weakness: Secondary | ICD-10-CM

## 2021-02-20 DIAGNOSIS — R293 Abnormal posture: Secondary | ICD-10-CM | POA: Diagnosis not present

## 2021-02-20 NOTE — Therapy (Signed)
[facility] [place], [place], [address] Phone: [phone]   Fax:  [phone]  Physical Therapy Treatment And Medicare 10th visit note   Progress Note Reporting Period 01/19/21 to 02/20/21  See note below for Objective Data and Assessment of Progress/Goals.      Patient Details  Name: Joan Lopez MRN: [id number] Date of Birth: 1951/03/27 Referring Provider (PT): Dr Dianah Field   Encounter Date: 02/20/2021   PT End of Session - 02/20/21 0847     Visit Number 10    Number of Visits 12    Date for PT Re-Evaluation 03/16/21    PT Start Time 2694    PT Stop Time 0930    PT Time Calculation (min) 43 min             Past Medical History:  Diagnosis Date   Abnormal LFTs 10/29/2012   Acquired bilateral renal cysts    Arthritis    Bilateral bunions    Constipation 10/16/2011   DDD (degenerative disc disease)    spine w narrowing   Dehydration, mild 12/26/2011   Diverticulitis    Exercise counseling 11/14/2011   Family history of brain cancer    Family history of breast cancer    Family history of cancer    Family history of colon cancer    Family history of kidney cancer    Family history of oral cancer    Family history of stomach cancer    Fuch's endothelial dystrophy    Guttate psoriasis    Hematuria 08/24/2011   Hemorrhoids    History of chicken pox    Hyperlipidemia    Kidney stones    Lipoma of arm 07/13/2012   Lipoma of arm 10/07/2012   Left    Mass of arm 07/13/2012   Has had them in past     Personal history of radiation therapy    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   Post-menopausal bleeding 10/08/2011   Reflux 04/17/2012   Renal insufficiency 01/11/2013   Thrombocytopenia ([place]) 09/27/2011    Past Surgical History:  Procedure Laterality Date   APPENDECTOMY  1986   BREAST LUMPECTOMY Right 10/2018   BREAST LUMPECTOMY WITH RADIOACTIVE SEED AND SENTINEL LYMPH NODE BIOPSY Right  10/24/2018   Procedure: RIGHT BREAST LUMPECTOMY WITH RADIOACTIVE SEED AND RIGHT AXILLARY SENTINEL LYMPH NODE BIOPSY, RIGHT BLUE DYE INJECTION;  Surgeon: Fanny Skates, MD;  Location: [facility];  Service: General;  Laterality: Right;  [place]  2005   (records here)[place] - diverticulosis and hemorrhoids   CORNEAL TRANSPLANT  2013   CYSTOSCOPY WITH RETROGRADE PYELOGRAM, URETEROSCOPY AND STENT PLACEMENT Right 10/22/2013   Procedure: [place], URETEROSCOPY ;  Surgeon: Raynelle Bring, MD;  Location[facility];  Service: Urology;  Laterality: Right;   LIPOMA EXCISION  2014   x 3, both arms   TONSILLECTOMY     possible adenoids   TOTAL ABDOMINAL HYSTERECTOMY  1991   for fibroids   TUBAL LIGATION     WRIST FRACTURE SURGERY     right, titanium plate and pins    There were no vitals filed for this visit.   Subjective Assessment - 02/20/21 0849     Subjective Patient reports that she ran 13.8 miles Saturday and did OK. She felt some fatigue in the LB area at the 10.6 mile. She finished the race  with run - walk and did OK.    Currently in Pain? No/denies    Pain Score 0-No pain                [facility] PT Assessment - 02/20/21 0001       Assessment   Medical Diagnosis Weakness bilat hips; lumbar stenosis    Referring Provider (PT) Dr Dianah Field    Onset Date/Surgical Date 01/08/21    Hand Dominance Right    Next MD Visit 03/14/21    Prior Therapy here for neck 2/21      AROM   Lumbar Flexion 80% pulling iin bilat LE's    Lumbar Extension 60%    Lumbar - Right Side Bend 85%    Lumbar - Left Side Bend 80%    Lumbar - Right Rotation 65%    Lumbar - Left Rotation 70%      Strength   Right Hip Flexion 5/5    Right Hip Extension 4+/5    Right Hip ABduction 5/5    Left Hip Flexion 5/5    Left Hip Extension 4+/5    Left Hip ABduction 5/5      Flexibility   Hamstrings tight Rt >  Lt    Quadriceps tight Rt > Lt    Piriformis tight bilat                           [facility] Adult PT Treatment/Exercise - 02/20/21 0001       Lumbar Exercises: Standing   Heel Raises Limitations SLS 20-30 sec x 3 reps each side min UE support for balance as needed    Wall Slides 10 reps    Wall Slides Limitations 10 sec hold coregeous ball btn knees; min knee bend to avoid crepitus      Lumbar Exercises: Supine   AB Set Limitations working on core in standing, walking, running, sitting in car    Bridge 10 reps    Bridge Limitations 10 sec hold - added 10# wt at hips    Bridge with [employer] 10 reps    Bridge with [employer] Limitations 10 sec hold    Other Supine Lumbar Exercises adductor squeeze w/ coregeous ball 10 sec hold x 10 reps      Knee/Hip Exercises: Standing   Lateral Step Up Right;Left;10 reps;Hand Hold: 1;Step Height: 4"    Lateral Step Up Limitations heel tap    Forward Step Up Right;Left;10 reps;Hand Hold: 0;Step Height: 4"    Wall Squat 10 reps;10 seconds    Wall Squat Limitations with ball squeeze btn knees                     PT Education - 02/20/21 0926     Education Details HEP    Person(s) Educated Patient    Methods Explanation;Demonstration;Tactile cues;Verbal cues;Handout    Comprehension Verbalized understanding;Returned demonstration;Verbal cues required;Tactile cues required              PT Short Term Goals - 01/19/21 1258       PT SHORT TERM GOAL #1   Title Patient independent with initial HEP    Time 4    Period Weeks    Status New    Target Date 02/16/21      PT SHORT TERM GOAL #2   Title Patient demonstrates improved symmetry in LE flexibility    Time 4    Period Weeks  Status New    Target Date 02/17/21      PT SHORT TERM GOAL #3   Title Patient demonstrate/verbalize understanding of improve posture and alignment with sitting/standing/walking    Time 4    Period Weeks    Status New     Target Date 02/17/21               PT Long Term Goals - 01/19/21 1300       PT LONG TERM GOAL #1   Title 5/5 strength bilat LE's    Time 8    Period Weeks    Status New    Target Date 03/16/21      PT LONG TERM GOAL #2   Title Improve core strength and stability in lower and upper core with patient to demonstrate impeove upright posture and alignment with posterior shoulder girdle musculature engaged    Baseline -    Time 8    Period Weeks    Target Date 03/16/21      PT LONG TERM GOAL #3   Title Patient reports no episodes of asymmetry with running    Time 8    Period Weeks    Status New    Target Date 03/16/21      PT LONG TERM GOAL #4   Title Independent inHEP    Baseline -    Time 8    Period Weeks    Status New    Target Date 03/16/21      PT LONG TERM GOAL #5   Title Improve functional limitation score to 80    Time 8    Period Weeks    Status New    Target Date 03/16/21                   Plan - 02/20/21 0910     Clinical Impression Statement Ran 1/2 marathon with mild fatigue in LB at ~ 10.6 miles but finished the race with run/walk for the last 3 miles. Strength is increasing in bilat hips. Added strengthening exercises specific to hip extension which remains weaker.    Rehab Potential Good    PT Frequency 2x / week    PT Duration 8 weeks    PT Treatment/Interventions ADLs/Self Care Home Management;Aquatic Therapy;Cryotherapy;Electrical Stimulation;Iontophoresis 4mg /ml Dexamethasone;Moist Heat;Ultrasound;Gait training;Stair training;Functional mobility training;Therapeutic activities;Therapeutic exercise;Balance training;Neuromuscular re-education;Patient/family education;Manual techniques;Passive range of motion;Dry needling;Taping    PT Next Visit Plan review HEP; progress with exercises working hips and core; add strengthening for quads focus on medial quad; myofacial release work; modalities as indicated - does not like DN    PT Home  Exercise Plan [email or web address]    Consulted and Agree with Plan of Care Patient             Patient will benefit from skilled therapeutic intervention in order to improve the following deficits and impairments:     Visit Diagnosis: Weakness generalized  Other symptoms and signs involving the musculoskeletal system  Abnormal posture     Problem List Patient Active Problem List   Diagnosis Date Noted   Deficiency of vitamin B12 02/08/2021   Weakness of hip 01/17/2021   Hearing loss due to cerumen impaction, right 01/18/2020   Vaginal irritation 01/18/2020   Osteoporosis 03/10/2019   Genetic testing 11/06/2018   Tick bite 11/03/2018   Family history of breast cancer    Family history of colon cancer    Family history of stomach cancer    Family history  of brain cancer    Family history of kidney cancer    Family history of oral cancer    Family history of cancer    Malignant neoplasm of upper-inner quadrant of right breast in female, estrogen receptor positive ([place]) 09/29/2018   Plantar fasciitis, right 08/15/2018   Left leg pain 11/07/2015   Myalgia 11/07/2015   Exercise counseling 03/01/2015   Dysfunction of right rotator cuff 12/14/2014   Skin tag 12/14/2014   Tongue lesion 06/14/2014   Primary osteoarthritis of both knees 05/17/2014   Annual physical exam 08/20/2013   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

## 2021-02-20 NOTE — Patient Instructions (Signed)
Access Code: [email or web address] URL: [email or web address] Date: 02/20/2021 Prepared by: Gillermo Murdoch  Exercises Prone Quadriceps Stretch with Strap - 2 x daily - 7 x weekly - 1 sets - 3 reps - 30 sec hold Hooklying Hamstring Stretch with Strap - 2 x daily - 7 x weekly - 1 sets - 3 reps - 30 sec hold Supine Piriformis Stretch with Leg Straight - 2 x daily - 7 x weekly - 1 sets - 3 reps - 30 sec hold Hip Flexor Stretch at Edge of Bed - 2 x daily - 7 x weekly - 1 sets - 3 reps - 30 sec hold Supine Transversus Abdominis Bracing with Pelvic Floor Contraction - 2 x daily - 7 x weekly - 1 sets - 10 reps - 10sec hold Shoulder External Rotation and Scapular Retraction with Resistance - 2 x daily - 7 x weekly - 3 sets - 10 reps - 3 hold Doorway Pec Stretch at 60 Degrees Abduction - 3 x daily - 7 x weekly - 1 sets - 3 reps Doorway Pec Stretch at 90 Degrees Abduction - 3 x daily - 7 x weekly - 1 sets - 3 reps - 30 seconds hold Doorway Pec Stretch at 120 Degrees Abduction - 3 x daily - 7 x weekly - 1 sets - 3 reps - 30 second hold hold Side Stepping with Resistance at Thighs and Counter Support Standing Shoulder Row Reactive Isometric - 5-10 reps - 60 sec hold Anti-Rotation Lateral Stepping with Press - 2 x daily - 7 x weekly - 1-2 sets - 10 reps - 2-3 sec hold Supine Hip Adduction Isometric with Ball - 2 x daily - 7 x weekly - 1 sets - 10 reps - 10 sec hold Supine Bridge with Mini Swiss Ball Between Knees - 1 x daily - 7 x weekly - 1-2 sets - 10 reps - 5-10 sec hold Straight Leg Raise with External Rotation - 2 x daily - 7 x weekly - 1-2 sets - 10 reps - 3-5 sec hold Single Leg Stance - 1 x daily - 7 x weekly - 2 sets - 5 reps - 20-30 sec hold Seated Thoracic Lumbar Extension with Pectoralis Stretch - 1 x daily - 7 x weekly - 1 sets - 5-8 reps - 10 sec hold Seated Thoracic Extension and Rotation with Reach - 1 x daily - 7 x weekly - 1 sets - 3 reps - 10 sec hold Thoracic Y on Foam Roll - 1 x daily - 7  x weekly - 1 sets - 1 reps - 1-2 min hold Prone Scapular Retraction - 2 x daily - 7 x weekly - 1 sets - 5-10 reps - 3-5 sec hold Prone Scapular Retraction Arms at Side - 2 x daily - 7 x weekly - 1 sets - 5-10 reps - 3-5 sec hold Prone W Scapular Retraction - 2 x daily - 7 x weekly - 1 sets - 5-10 reps - 3-5 sec hold Prone Scapular Retraction Y - 2 x daily - 7 x weekly - 1 sets - 3-5 reps - 5-10 sec hold Prone Scapular Retraction in Flexion - 2 x daily - 7 x weekly - 1 sets - 5-10 reps - 3-5 sec hold Standing Plank on Wall - 2 x daily - 7 x weekly - 1 sets - 3 reps - 30 sec hold Anti-Rotation Lateral Stepping with Press - 2 x daily - 7 x weekly - 1-2 sets - 10 reps - 2-3 sec hold  Wall Squat Hold with Ball - 1 x daily - 7 x weekly - 1-2 sets - 10 reps - 5-10 sec hold Step Up - 1 x daily - 7 x weekly - 1 sets - 10 reps - 2 sec hold Lateral Step Up - 1 x daily - 7 x weekly - 2 sets - 10 reps - 2 sec hold The Diver - 1 x daily - 7 x weekly - 1 sets - 10 reps - 2-3 sec hold

## 2021-02-22 ENCOUNTER — Ambulatory Visit (INDEPENDENT_AMBULATORY_CARE_PROVIDER_SITE_OTHER): Payer: Medicare Other | Admitting: Rehabilitative and Restorative Service Providers"

## 2021-02-22 ENCOUNTER — Encounter: Payer: Self-pay | Admitting: Rehabilitative and Restorative Service Providers"

## 2021-02-22 ENCOUNTER — Other Ambulatory Visit: Payer: Self-pay

## 2021-02-22 DIAGNOSIS — R293 Abnormal posture: Secondary | ICD-10-CM

## 2021-02-22 DIAGNOSIS — R531 Weakness: Secondary | ICD-10-CM | POA: Diagnosis not present

## 2021-02-22 DIAGNOSIS — R29898 Other symptoms and signs involving the musculoskeletal system: Secondary | ICD-10-CM

## 2021-02-22 NOTE — Therapy (Signed)
[place], [place], [address] Phone: [phone]   Fax:  [phone]  Physical Therapy Treatment  Patient Details  Name: Joan Lopez MRN: [id number] Date of Birth: Nov 01, 1950 Referring Provider (PT): Dr Dianah Field   Encounter Date: 02/22/2021   PT End of Session - 02/22/21 0855     Visit Number 11    Number of Visits 12    Date for PT Re-Evaluation 03/16/21    PT Start Time 0845    PT Stop Time 0930    PT Time Calculation (min) 45 min    Activity Tolerance Patient tolerated treatment well             Past Medical History:  Diagnosis Date   Abnormal LFTs 10/29/2012   Acquired bilateral renal cysts    Arthritis    Bilateral bunions    Constipation 10/16/2011   DDD (degenerative disc disease)    spine w narrowing   Dehydration, mild 12/26/2011   Diverticulitis    Exercise counseling 11/14/2011   Family history of brain cancer    Family history of breast cancer    Family history of cancer    Family history of colon cancer    Family history of kidney cancer    Family history of oral cancer    Family history of stomach cancer    Fuch's endothelial dystrophy    Guttate psoriasis    Hematuria 08/24/2011   Hemorrhoids    History of chicken pox    Hyperlipidemia    Kidney stones    Lipoma of arm 07/13/2012   Lipoma of arm 10/07/2012   Left    Mass of arm 07/13/2012   Has had them in past     Personal history of radiation therapy    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   Post-menopausal bleeding 10/08/2011   Reflux 04/17/2012   Renal insufficiency 01/11/2013   Thrombocytopenia ([place]) 09/27/2011    Past Surgical History:  Procedure Laterality Date   APPENDECTOMY  1986   BREAST LUMPECTOMY Right 10/2018   BREAST LUMPECTOMY WITH RADIOACTIVE SEED AND SENTINEL LYMPH NODE BIOPSY Right 10/24/2018   Procedure: RIGHT BREAST LUMPECTOMY WITH RADIOACTIVE SEED AND RIGHT AXILLARY SENTINEL LYMPH NODE  BIOPSY, RIGHT BLUE DYE INJECTION;  Surgeon: Fanny Skates, MD;  Location: [facility];  Service: General;  Laterality: Right;  [place]     COLONOSCOPY  2005   (records here)[place] - diverticulosis and hemorrhoids   CORNEAL TRANSPLANT  2013   CYSTOSCOPY WITH RETROGRADE PYELOGRAM, URETEROSCOPY AND STENT PLACEMENT Right 10/22/2013   Procedure: [place], URETEROSCOPY ;  Surgeon: Raynelle Bring, MD;  Location: [facility];  Service: Urology;  Laterality: Right;   LIPOMA EXCISION  2014   x 3, both arms   TONSILLECTOMY     possible adenoids   TOTAL ABDOMINAL HYSTERECTOMY  1991   for fibroids   TUBAL LIGATION     WRIST FRACTURE SURGERY     right, titanium plate and pins    There were no vitals filed for this visit.   Subjective Assessment - 02/22/21 0859     Subjective Found a surface to use for step ups. Using a paper cutter.    Currently in Pain? No/denies    Pain Score 0-No pain  [facility] Adult PT Treatment/Exercise - 02/22/21 0001       Lumbar Exercises: Stretches   Passive Hamstring Stretch Right;Left;30 seconds;2 reps    Passive Hamstring Stretch Limitations supine with strap    Quad Stretch Right;Left;3 reps;30 seconds    Quad Stretch Limitations prone with strap    Piriformis Stretch Right;2 reps;30 seconds    Piriformis Stretch Limitations supine travell - foot across opposite thigh resting on surface repeated with foot unsupported, knee closer to chest      Lumbar Exercises: Supine   AB Set Limitations working on core in standing, walking, running, sitting in car      Knee/Hip Exercises: Standing   Forward Lunges Right;Left;10 reps;2 seconds    Hip Extension Stengthening;Right;Left;10 reps;Knee bent    Extension Limitations upper body flexed forward onto table extending hip hold 3-5 sec x 10 reps each LE    Walking with Sports Cord stepping  backward; sidesteps; forward against orange band x 6-8 reps each hold 5-10 sec                       PT Short Term Goals - 01/19/21 1258       PT SHORT TERM GOAL #1   Title Patient independent with initial HEP    Time 4    Period Weeks    Status New    Target Date 02/16/21      PT SHORT TERM GOAL #2   Title Patient demonstrates improved symmetry in LE flexibility    Time 4    Period Weeks    Status New    Target Date 02/17/21      PT SHORT TERM GOAL #3   Title Patient demonstrate/verbalize understanding of improve posture and alignment with sitting/standing/walking    Time 4    Period Weeks    Status New    Target Date 02/17/21               PT Long Term Goals - 01/19/21 1300       PT LONG TERM GOAL #1   Title 5/5 strength bilat LE's    Time 8    Period Weeks    Status New    Target Date 03/16/21      PT LONG TERM GOAL #2   Title Improve core strength and stability in lower and upper core with patient to demonstrate impeove upright posture and alignment with posterior shoulder girdle musculature engaged    Baseline -    Time 8    Period Weeks    Target Date 03/16/21      PT LONG TERM GOAL #3   Title Patient reports no episodes of asymmetry with running    Time 8    Period Weeks    Status New    Target Date 03/16/21      PT LONG TERM GOAL #4   Title Independent inHEP    Baseline -    Time 8    Period Weeks    Status New    Target Date 03/16/21      PT LONG TERM GOAL #5   Title Improve functional limitation score to 80    Time 8    Period Weeks    Status New    Target Date 03/16/21                   Plan - 02/22/21 0933     Clinical Impression Statement Continues to work  on HEP without problems. Ran 1/2 marathon Saturday run/walk the last 3 miles with some fatigue. Added lunges; hip ext bent forward; sports cord walks to increase hip extensor strength. Did not add additional exercises for home.    Rehab Potential Good     PT Frequency 2x / week    PT Duration 8 weeks    PT Treatment/Interventions ADLs/Self Care Home Management;Aquatic Therapy;Cryotherapy;Electrical Stimulation;Iontophoresis 4mg /ml Dexamethasone;Moist Heat;Ultrasound;Gait training;Stair training;Functional mobility training;Therapeutic activities;Therapeutic exercise;Balance training;Neuromuscular re-education;Patient/family education;Manual techniques;Passive range of motion;Dry needling;Taping    PT Next Visit Plan review HEP; progress with exercises working hips and core; add strengthening for quads focus on medial quad; myofacial release work; modalities as indicated - does not like DN    PT Home Exercise Plan [email or web address]    Consulted and Agree with Plan of Care Patient             Patient will benefit from skilled therapeutic intervention in order to improve the following deficits and impairments:     Visit Diagnosis: Weakness generalized  Other symptoms and signs involving the musculoskeletal system  Abnormal posture     Problem List Patient Active Problem List   Diagnosis Date Noted   Deficiency of vitamin B12 02/08/2021   Weakness of hip 01/17/2021   Hearing loss due to cerumen impaction, right 01/18/2020   Vaginal irritation 01/18/2020   Osteoporosis 03/10/2019   Genetic testing 11/06/2018   Tick bite 11/03/2018   Family history of breast cancer    Family history of colon cancer    Family history of stomach cancer    Family history of brain cancer    Family history of kidney cancer    Family history of oral cancer    Family history of cancer    Malignant neoplasm of upper-inner quadrant of right breast in female, estrogen receptor positive ([place]) 09/29/2018   Plantar fasciitis, right 08/15/2018   Left leg pain 11/07/2015   Myalgia 11/07/2015   Exercise counseling 03/01/2015   Dysfunction of right rotator cuff 12/14/2014   Skin tag 12/14/2014   Tongue lesion 06/14/2014   Primary osteoarthritis of both knees  05/17/2014   Annual physical exam 08/20/2013   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

## 2021-03-01 ENCOUNTER — Ambulatory Visit (INDEPENDENT_AMBULATORY_CARE_PROVIDER_SITE_OTHER): Payer: Medicare Other | Admitting: Rehabilitative and Restorative Service Providers"

## 2021-03-01 ENCOUNTER — Encounter: Payer: Self-pay | Admitting: Rehabilitative and Restorative Service Providers"

## 2021-03-01 ENCOUNTER — Other Ambulatory Visit: Payer: Self-pay

## 2021-03-01 DIAGNOSIS — R29898 Other symptoms and signs involving the musculoskeletal system: Secondary | ICD-10-CM | POA: Diagnosis not present

## 2021-03-01 DIAGNOSIS — R293 Abnormal posture: Secondary | ICD-10-CM

## 2021-03-01 DIAGNOSIS — R531 Weakness: Secondary | ICD-10-CM | POA: Diagnosis not present

## 2021-03-01 NOTE — Patient Instructions (Signed)
Access Code: [email or web address] URL: [email or web address] Date: 03/01/2021 Prepared by: Gillermo Murdoch  Exercises Prone Quadriceps Stretch with Strap - 2 x daily - 7 x weekly - 1 sets - 3 reps - 30 sec hold Hooklying Hamstring Stretch with Strap - 2 x daily - 7 x weekly - 1 sets - 3 reps - 30 sec hold Supine Piriformis Stretch with Leg Straight - 2 x daily - 7 x weekly - 1 sets - 3 reps - 30 sec hold Hip Flexor Stretch at Edge of Bed - 2 x daily - 7 x weekly - 1 sets - 3 reps - 30 sec hold Supine Transversus Abdominis Bracing with Pelvic Floor Contraction - 2 x daily - 7 x weekly - 1 sets - 10 reps - 10sec hold Shoulder External Rotation and Scapular Retraction with Resistance - 2 x daily - 7 x weekly - 3 sets - 10 reps - 3 hold Doorway Pec Stretch at 60 Degrees Abduction - 3 x daily - 7 x weekly - 1 sets - 3 reps Doorway Pec Stretch at 90 Degrees Abduction - 3 x daily - 7 x weekly - 1 sets - 3 reps - 30 seconds hold Doorway Pec Stretch at 120 Degrees Abduction - 3 x daily - 7 x weekly - 1 sets - 3 reps - 30 second hold hold Side Stepping with Resistance at Thighs and Counter Support Standing Shoulder Row Reactive Isometric - 5-10 reps - 60 sec hold Anti-Rotation Lateral Stepping with Press - 2 x daily - 7 x weekly - 1-2 sets - 10 reps - 2-3 sec hold Supine Hip Adduction Isometric with Ball - 2 x daily - 7 x weekly - 1 sets - 10 reps - 10 sec hold Supine Bridge with Mini Swiss Ball Between Knees - 1 x daily - 7 x weekly - 1-2 sets - 10 reps - 5-10 sec hold Straight Leg Raise with External Rotation - 2 x daily - 7 x weekly - 1-2 sets - 10 reps - 3-5 sec hold Single Leg Stance - 1 x daily - 7 x weekly - 2 sets - 5 reps - 20-30 sec hold Seated Thoracic Lumbar Extension with Pectoralis Stretch - 1 x daily - 7 x weekly - 1 sets - 5-8 reps - 10 sec hold Seated Thoracic Extension and Rotation with Reach - 1 x daily - 7 x weekly - 1 sets - 3 reps - 10 sec hold Thoracic Y on Foam Roll - 1 x daily - 7  x weekly - 1 sets - 1 reps - 1-2 min hold Prone Scapular Retraction - 2 x daily - 7 x weekly - 1 sets - 5-10 reps - 3-5 sec hold Prone Scapular Retraction Arms at Side - 2 x daily - 7 x weekly - 1 sets - 5-10 reps - 3-5 sec hold Prone W Scapular Retraction - 2 x daily - 7 x weekly - 1 sets - 5-10 reps - 3-5 sec hold Prone Scapular Retraction Y - 2 x daily - 7 x weekly - 1 sets - 3-5 reps - 5-10 sec hold Prone Scapular Retraction in Flexion - 2 x daily - 7 x weekly - 1 sets - 5-10 reps - 3-5 sec hold Standing Plank on Wall - 2 x daily - 7 x weekly - 1 sets - 3 reps - 30 sec hold Anti-Rotation Lateral Stepping with Press - 2 x daily - 7 x weekly - 1-2 sets - 10 reps - 2-3 sec hold  Wall Squat Hold with Ball - 1 x daily - 7 x weekly - 1-2 sets - 10 reps - 5-10 sec hold Step Up - 1 x daily - 7 x weekly - 1 sets - 10 reps - 2 sec hold Lateral Step Up - 1 x daily - 7 x weekly - 2 sets - 10 reps - 2 sec hold [place] Diver - 1 x daily - 7 x weekly - 1 sets - 10 reps - 2-3 sec hold Plank on Counter - 2 x daily - 7 x weekly - 1 sets - 3 reps - 30 sec hold

## 2021-03-01 NOTE — Therapy (Signed)
[place], [place], [address] Phone: [phone]   Fax:  [phone]  Physical Therapy Treatment  Patient Details  Name: Joan Lopez MRN: [id number] Date of Birth: Oct 26, 1950 Referring Provider (PT): Dr Dianah Field   Encounter Date: 03/01/2021   PT End of Session - 03/01/21 0847     Visit Number 12    Number of Visits 20    Date for PT Re-Evaluation 04/26/21    PT Start Time 0846    PT Stop Time 0930    PT Time Calculation (min) 44 min    Activity Tolerance Patient tolerated treatment well             Past Medical History:  Diagnosis Date   Abnormal LFTs 10/29/2012   Acquired bilateral renal cysts    Arthritis    Bilateral bunions    Constipation 10/16/2011   DDD (degenerative disc disease)    spine w narrowing   Dehydration, mild 12/26/2011   Diverticulitis    Exercise counseling 11/14/2011   Family history of brain cancer    Family history of breast cancer    Family history of cancer    Family history of colon cancer    Family history of kidney cancer    Family history of oral cancer    Family history of stomach cancer    Fuch's endothelial dystrophy    Guttate psoriasis    Hematuria 08/24/2011   Hemorrhoids    History of chicken pox    Hyperlipidemia    Kidney stones    Lipoma of arm 07/13/2012   Lipoma of arm 10/07/2012   Left    Mass of arm 07/13/2012   Has had them in past     Personal history of radiation therapy    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   Post-menopausal bleeding 10/08/2011   Reflux 04/17/2012   Renal insufficiency 01/11/2013   Thrombocytopenia ([place]) 09/27/2011    Past Surgical History:  Procedure Laterality Date   APPENDECTOMY  1986   BREAST LUMPECTOMY Right 10/2018   BREAST LUMPECTOMY WITH RADIOACTIVE SEED AND SENTINEL LYMPH NODE BIOPSY Right 10/24/2018   Procedure: RIGHT BREAST LUMPECTOMY WITH RADIOACTIVE SEED AND RIGHT AXILLARY SENTINEL LYMPH NODE  BIOPSY, RIGHT BLUE DYE INJECTION;  Surgeon: Fanny Skates, MD;  Location: [facility];  Service: General;  Laterality: Right;  [place]     COLONOSCOPY  2005   (records here)[place] - diverticulosis and hemorrhoids   CORNEAL TRANSPLANT  2013   CYSTOSCOPY WITH RETROGRADE PYELOGRAM, URETEROSCOPY AND STENT PLACEMENT Right 10/22/2013   Procedure: CYSTOSCOPY WITH RETROGRADE PYELOGRAM, URETEROSCOPY ;  Surgeon: Raynelle Bring, MD;  [facility];  Service: Urology;  Laterality: Right;   LIPOMA EXCISION  2014   x 3, both arms   TONSILLECTOMY     possible adenoids   TOTAL ABDOMINAL HYSTERECTOMY  1991   for fibroids   TUBAL LIGATION     WRIST FRACTURE SURGERY     right, titanium plate and pins    There were no vitals filed for this visit.   Subjective Assessment - 03/01/21 0924     Subjective Wants to decrease amount of time for exercises. Discussed and modified exercises to decreased time for exercise program.    Currently in Pain? No/denies    Pain Score 0-No pain  [facility] PT Assessment - 03/01/21 0001       Assessment   Medical Diagnosis Weakness bilat hips; lumbar stenosis    Referring Provider (PT) Dr Dianah Field    Onset Date/Surgical Date 01/08/21    Hand Dominance Right    Next MD Visit 03/14/21    Prior Therapy here for neck 2/21      Flexibility   Hamstrings tight Rt > Lt; WFL's    Quadriceps tight Rt > Lt; WFL's    Piriformis decreased tightness                           [facility] Adult PT Treatment/Exercise - 03/01/21 0001       Lumbar Exercises: Standing   Wall Slides 10 reps    Wall Slides Limitations 10 sec hold coregeous ball btn knees; min knee bend to avoid crepitus    Scapular Retraction Strengthening;Both;10 reps;Theraband    Theraband Level (Scapular Retraction) Level 2 (Red)    Row Strengthening;Both;Theraband    Theraband Level (Row) Level 4 (Blue)    Row  Limitations isometric step back; added active row, anternating UEs and bilat UE's      Knee/Hip Exercises: Standing   Other Standing Knee Exercises counter plank 60 sec x 2    Other Standing Knee Exercises heel cord stretch bilat 1-2 reps x 2 reps                     PT Education - 03/01/21 0921     Education Details HEP    Person(s) Educated Patient    Methods Explanation;Demonstration;Tactile cues;Verbal cues;Handout    Comprehension Verbalized understanding;Returned demonstration;Verbal cues required;Tactile cues required              PT Short Term Goals - 01/19/21 1258       PT SHORT TERM GOAL #1   Title Patient independent with initial HEP    Time 4    Period Weeks    Status New    Target Date 02/16/21      PT SHORT TERM GOAL #2   Title Patient demonstrates improved symmetry in LE flexibility    Time 4    Period Weeks    Status New    Target Date 02/17/21      PT SHORT TERM GOAL #3   Title Patient demonstrate/verbalize understanding of improve posture and alignment with sitting/standing/walking    Time 4    Period Weeks    Status New    Target Date 02/17/21               PT Long Term Goals - 01/19/21 1300       PT LONG TERM GOAL #1   Title 5/5 strength bilat LE's    Time 8    Period Weeks    Status New    Target Date 03/16/21      PT LONG TERM GOAL #2   Title Improve core strength and stability in lower and upper core with patient to demonstrate impeove upright posture and alignment with posterior shoulder girdle musculature engaged    Baseline -    Time 8    Period Weeks    Target Date 03/16/21      PT LONG TERM GOAL #3   Title Patient reports no episodes of asymmetry with running    Time 8    Period Weeks    Status New    Target Date  03/16/21      PT LONG TERM GOAL #4   Title Independent inHEP    Baseline -    Time 8    Period Weeks    Status New    Target Date 03/16/21      PT LONG TERM GOAL #5   Title Improve  functional limitation score to 80    Time 8    Period Weeks    Status New    Target Date 03/16/21                   Plan - 03/01/21 0920     Clinical Impression Statement Doing well with HEP and running program. Will run in a marathon Saturday, 11/19 Modified and grouped HEP to decrease time for exercises. Patient will see how the new program goes and we will adjust as needed at next visit.    Rehab Potential Good    PT Frequency 2x / week    PT Duration 8 weeks    PT Treatment/Interventions ADLs/Self Care Home Management;Aquatic Therapy;Cryotherapy;Electrical Stimulation;Iontophoresis 4mg /ml Dexamethasone;Moist Heat;Ultrasound;Gait training;Stair training;Functional mobility training;Therapeutic activities;Therapeutic exercise;Balance training;Neuromuscular re-education;Patient/family education;Manual techniques;Passive range of motion;Dry needling;Taping    PT Next Visit Plan review HEP; progress with exercises working hips and core; add strengthening for quads focus on medial quad; myofacial release work; modalities as indicated - does not like DN    PT Home Exercise Plan [email or web address]    Consulted and Agree with Plan of Care Patient             Patient will benefit from skilled therapeutic intervention in order to improve the following deficits and impairments:     Visit Diagnosis: Weakness generalized  Other symptoms and signs involving the musculoskeletal system  Abnormal posture     Problem List Patient Active Problem List   Diagnosis Date Noted   Deficiency of vitamin B12 02/08/2021   Weakness of hip 01/17/2021   Hearing loss due to cerumen impaction, right 01/18/2020   Vaginal irritation 01/18/2020   Osteoporosis 03/10/2019   Genetic testing 11/06/2018   Tick bite 11/03/2018   Family history of breast cancer    Family history of colon cancer    Family history of stomach cancer    Family history of brain cancer    Family history of kidney cancer     Family history of oral cancer    Family history of cancer    Malignant neoplasm of upper-inner quadrant of right breast in female, estrogen receptor positive ([place]) 09/29/2018   Plantar fasciitis, right 08/15/2018   Left leg pain 11/07/2015   Myalgia 11/07/2015   Exercise counseling 03/01/2015   Dysfunction of right rotator cuff 12/14/2014   Skin tag 12/14/2014   Tongue lesion 06/14/2014   Primary osteoarthritis of both knees 05/17/2014   Annual physical exam 08/20/2013   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

## 2021-03-06 ENCOUNTER — Encounter: Payer: Medicare Other | Admitting: Rehabilitative and Restorative Service Providers"

## 2021-03-08 ENCOUNTER — Other Ambulatory Visit: Payer: Self-pay

## 2021-03-08 ENCOUNTER — Encounter: Payer: Self-pay | Admitting: Rehabilitative and Restorative Service Providers"

## 2021-03-08 ENCOUNTER — Ambulatory Visit (INDEPENDENT_AMBULATORY_CARE_PROVIDER_SITE_OTHER): Payer: Medicare Other | Admitting: Rehabilitative and Restorative Service Providers"

## 2021-03-08 DIAGNOSIS — R531 Weakness: Secondary | ICD-10-CM | POA: Diagnosis not present

## 2021-03-08 DIAGNOSIS — R293 Abnormal posture: Secondary | ICD-10-CM | POA: Diagnosis not present

## 2021-03-08 DIAGNOSIS — R29898 Other symptoms and signs involving the musculoskeletal system: Secondary | ICD-10-CM

## 2021-03-08 NOTE — Patient Instructions (Signed)
Access Code: [email or web address] URL: [email or web address] Date: 03/08/2021 Prepared by: Gillermo Murdoch  Exercises Prone Quadriceps Stretch with Strap - 2 x daily - 7 x weekly - 1 sets - 3 reps - 30 sec hold Hooklying Hamstring Stretch with Strap - 2 x daily - 7 x weekly - 1 sets - 3 reps - 30 sec hold Supine Piriformis Stretch with Leg Straight - 2 x daily - 7 x weekly - 1 sets - 3 reps - 30 sec hold Hip Flexor Stretch at Edge of Bed - 2 x daily - 7 x weekly - 1 sets - 3 reps - 30 sec hold Supine Transversus Abdominis Bracing with Pelvic Floor Contraction - 2 x daily - 7 x weekly - 1 sets - 10 reps - 10sec hold Shoulder External Rotation and Scapular Retraction with Resistance - 2 x daily - 7 x weekly - 3 sets - 10 reps - 3 hold Doorway Pec Stretch at 60 Degrees Abduction - 3 x daily - 7 x weekly - 1 sets - 3 reps Doorway Pec Stretch at 90 Degrees Abduction - 3 x daily - 7 x weekly - 1 sets - 3 reps - 30 seconds hold Doorway Pec Stretch at 120 Degrees Abduction - 3 x daily - 7 x weekly - 1 sets - 3 reps - 30 second hold hold Side Stepping with Resistance at Thighs and Counter Support Standing Shoulder Row Reactive Isometric - 5-10 reps - 60 sec hold Anti-Rotation Lateral Stepping with Press - 2 x daily - 7 x weekly - 1-2 sets - 10 reps - 2-3 sec hold Supine Hip Adduction Isometric with Ball - 2 x daily - 7 x weekly - 1 sets - 10 reps - 10 sec hold Supine Bridge with Mini Swiss Ball Between Knees - 1 x daily - 7 x weekly - 1-2 sets - 10 reps - 5-10 sec hold Straight Leg Raise with External Rotation - 2 x daily - 7 x weekly - 1-2 sets - 10 reps - 3-5 sec hold Single Leg Stance - 1 x daily - 7 x weekly - 2 sets - 5 reps - 20-30 sec hold Seated Thoracic Lumbar Extension with Pectoralis Stretch - 1 x daily - 7 x weekly - 1 sets - 5-8 reps - 10 sec hold Seated Thoracic Extension and Rotation with Reach - 1 x daily - 7 x weekly - 1 sets - 3 reps - 10 sec hold Thoracic Y on Foam Roll - 1 x daily - 7  x weekly - 1 sets - 1 reps - 1-2 min hold Prone Scapular Retraction - 2 x daily - 7 x weekly - 1 sets - 5-10 reps - 3-5 sec hold Prone Scapular Retraction Arms at Side - 2 x daily - 7 x weekly - 1 sets - 5-10 reps - 3-5 sec hold Prone W Scapular Retraction - 2 x daily - 7 x weekly - 1 sets - 5-10 reps - 3-5 sec hold Prone Scapular Retraction Y - 2 x daily - 7 x weekly - 1 sets - 3-5 reps - 5-10 sec hold Prone Scapular Retraction in Flexion - 2 x daily - 7 x weekly - 1 sets - 5-10 reps - 3-5 sec hold Standing Plank on Wall - 2 x daily - 7 x weekly - 1 sets - 3 reps - 30 sec hold Anti-Rotation Lateral Stepping with Press - 2 x daily - 7 x weekly - 1-2 sets - 10 reps - 2-3 sec hold  Wall Squat Hold with Ball - 1 x daily - 7 x weekly - 1-2 sets - 10 reps - 5-10 sec hold Step Up - 1 x daily - 7 x weekly - 1 sets - 10 reps - 2 sec hold Lateral Step Up - 1 x daily - 7 x weekly - 2 sets - 10 reps - 2 sec hold The Diver - 1 x daily - 7 x weekly - 1 sets - 10 reps - 2-3 sec hold Plank on Counter - 2 x daily - 7 x weekly - 1 sets - 3 reps - 30 sec hold Supine Bridge with Knee Extension and Pelvic Floor Contraction - 2 x daily - 7 x weekly - 1-2 sets - 10 reps - 5 sec hold

## 2021-03-08 NOTE — Therapy (Addendum)
[facility] [place], [place], [address] Phone: [phone]   Fax:  [phone]  Physical Therapy Treatment and Discharge Summary  PHYSICAL THERAPY DISCHARGE SUMMARY  Visits from Start of Care: 13  Current functional level related to goals / functional outcomes: See progress note for discharge status   Remaining deficits: Needs to continue with LE strengthening    Education / Equipment: HEP    Patient agrees to discharge. Patient goals were met. Patient is being discharged due to meeting the stated rehab goals.  Railynn Ballo P. Helene Kelp PT, MPH 04/21/21 11:42 AM    Patient Details  Name: Joan Lopez MRN: [id number] Date of Birth: [birth year] Referring Provider (PT): Dr Dianah Field   Encounter Date: 03/08/2021   PT End of Session - 03/08/21 0845     Visit Number 13    Number of Visits 20    Date for PT Re-Evaluation 04/26/21    PT Start Time 0845    PT Stop Time 0930    PT Time Calculation (min) 45 min    Activity Tolerance Patient tolerated treatment well             Past Medical History:  Diagnosis Date   Abnormal LFTs 10/29/2012   Acquired bilateral renal cysts    Arthritis    Bilateral bunions    Constipation 10/16/2011   DDD (degenerative disc disease)    spine w narrowing   Dehydration, mild 12/26/2011   Diverticulitis    Exercise counseling 11/14/2011   Family history of brain cancer    Family history of breast cancer    Family history of cancer    Family history of colon cancer    Family history of kidney cancer    Family history of oral cancer    Family history of stomach cancer    Fuch's endothelial dystrophy    Guttate psoriasis    Hematuria 08/24/2011   Hemorrhoids    History of chicken pox    Hyperlipidemia    Kidney stones    Lipoma of arm 07/13/2012   Lipoma of arm 10/07/2012   Left    Mass of arm 07/13/2012   Has had them in past     Personal history of radiation  therapy    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   Post-menopausal bleeding 10/08/2011   Reflux 04/17/2012   Renal insufficiency 01/11/2013   Thrombocytopenia ([place]) 09/27/2011    Past Surgical History:  Procedure Laterality Date   APPENDECTOMY  1986   BREAST LUMPECTOMY Right 10/2018   BREAST LUMPECTOMY WITH RADIOACTIVE SEED AND SENTINEL LYMPH NODE BIOPSY Right 10/24/2018   Procedure: RIGHT BREAST LUMPECTOMY WITH RADIOACTIVE SEED AND RIGHT AXILLARY SENTINEL LYMPH NODE BIOPSY, RIGHT BLUE DYE INJECTION;  Surgeon: Fanny Skates, MD;  Location: [facility];  Service: General;  Laterality: Right;  [place]  2005   (records here)[place] - diverticulosis and hemorrhoids   CORNEAL TRANSPLANT  2013   CYSTOSCOPY WITH RETROGRADE PYELOGRAM, URETEROSCOPY AND STENT PLACEMENT Right 10/22/2013   Procedure: [place], URETEROSCOPY ;  Surgeon: Raynelle Bring, MD;  Location: [facility];  Service: Urology;  Laterality: Right;   LIPOMA EXCISION  2014   x 3, both arms   TONSILLECTOMY     possible adenoids   TOTAL ABDOMINAL HYSTERECTOMY  1991   for fibroids   TUBAL LIGATION     [facility]  FRACTURE SURGERY     right, titanium plate and pins    There were no vitals filed for this visit.   Subjective Assessment - 03/08/21 0845     Subjective Finished the marathon Saturday. Finished first in her age bracket. She finished in 2 hours and 40 some minutes.    Currently in Pain? No/denies    Pain Score 0-No pain    Pain Location Knee    Pain Orientation Left                [facility] PT Assessment - 03/08/21 0001       Assessment   Medical Diagnosis Weakness bilat hips; lumbar stenosis    Referring Provider (PT) Dr Dianah Field    Onset Date/Surgical Date 01/08/21    Hand Dominance Right    Next MD Visit 03/14/21    Prior Therapy here for neck 2/21      Observation/Other Assessments   Focus on Therapeutic Outcomes  (FOTO)  99      AROM   Lumbar Flexion 80% pulling iin bilat LE's    Lumbar Extension 65%    Lumbar - Right Side Bend 90%    Lumbar - Left Side Bend 85%    Lumbar - Right Rotation 70%    Lumbar - Left Rotation 75%      Strength   Overall Strength Comments good gains in core strength and stability    Right Hip Flexion 5/5    Right Hip Extension 5/5    Right Hip ABduction 5/5    Left Hip Flexion 5/5    Left Hip Extension --   5-/5   Left Hip ABduction 5/5      Flexibility   Hamstrings WFL's    Quadriceps WFL's    Piriformis WFL's      Palpation   Palpation comment improving muscular tightness Rt > Lt piriformis; glut med; Lt QL; Lt psoas                                    PT Education - 03/08/21 0909     Education Details HEP    Person(s) Educated Patient    Methods Explanation;Demonstration;Tactile cues;Verbal cues;Handout    Comprehension Verbalized understanding;Returned demonstration;Verbal cues required;Tactile cues required              PT Short Term Goals - 01/19/21 1258       PT SHORT TERM GOAL #1   Title Patient independent with initial HEP    Time 4    Period Weeks    Status New    Target Date 02/16/21      PT SHORT TERM GOAL #2   Title Patient demonstrates improved symmetry in LE flexibility    Time 4    Period Weeks    Status New    Target Date 02/17/21      PT SHORT TERM GOAL #3   Title Patient demonstrate/verbalize understanding of improve posture and alignment with sitting/standing/walking    Time 4    Period Weeks    Status New    Target Date 02/17/21               PT Long Term Goals - 03/08/21 1023       PT LONG TERM GOAL #1   Title 5/5 strength bilat LE's    Time 8    Period Weeks    Status Partially  Met    Target Date 04/26/21      PT LONG TERM GOAL #2   Title Improve core strength and stability in lower and upper core with patient to demonstrate improve upright posture and alignment with  posterior shoulder girdle musculature engaged    Time 8    Period Weeks    Status Achieved    Target Date 04/26/21      PT LONG TERM GOAL #3   Title Patient reports no episodes of asymmetry with running    Time 8    Period Weeks    Status Achieved    Target Date 04/26/21      PT LONG TERM GOAL #4   Title Independent inHEP    Time 8    Period Weeks    Status Achieved      PT LONG TERM GOAL #5   Title Improve functional limitation score to 80    Time 8    Period Weeks    Status Achieved                   Plan - 03/08/21 0907     Clinical Impression Statement Progressing well with rehab. She demonstrates increased hip and LE strength and stability. Patient finished marathon Saturday with some fatigue but no significant problems or listing. Patient is progressing well toward stated goals of therapy. She needs to continue with core and LE strengthening.    Rehab Potential Good    PT Frequency 2x / week    PT Duration 8 weeks    PT Treatment/Interventions ADLs/Self Care Home Management;Aquatic Therapy;Cryotherapy;Electrical Stimulation;Iontophoresis 58m/ml Dexamethasone;Moist Heat;Ultrasound;Gait training;Stair training;Functional mobility training;Therapeutic activities;Therapeutic exercise;Balance training;Neuromuscular re-education;Patient/family education;Manual techniques;Passive range of motion;Dry needling;Taping    PT Home Exercise Plan [phone]            Patient will benefit from skilled therapeutic intervention in order to improve the following deficits and impairments:     Visit Diagnosis: Weakness generalized  Other symptoms and signs involving the musculoskeletal system  Abnormal posture     Problem List Patient Active Problem List   Diagnosis Date Noted   Deficiency of vitamin B12 02/08/2021   Weakness of hip 01/17/2021   Hearing loss due to cerumen impaction, right 01/18/2020   Vaginal irritation 01/18/2020   Osteoporosis 03/10/2019    Genetic testing 11/06/2018   Tick bite 11/03/2018   Family history of breast cancer    Family history of colon cancer    Family history of stomach cancer    Family history of brain cancer    Family history of kidney cancer    Family history of oral cancer    Family history of cancer    Malignant neoplasm of upper-inner quadrant of right breast in female, estrogen receptor positive ([place] 09/29/2018   Plantar fasciitis, right 08/15/2018   Left leg pain 11/07/2015   Myalgia 11/07/2015   Exercise counseling 03/01/2015   Dysfunction of right rotator cuff 12/14/2014   Skin tag 12/14/2014   Tongue lesion 06/14/2014   Primary osteoarthritis of both knees 05/17/2014   Annual physical exam 08/20/2013   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

## 2021-03-14 ENCOUNTER — Other Ambulatory Visit: Payer: Self-pay

## 2021-03-14 ENCOUNTER — Ambulatory Visit (INDEPENDENT_AMBULATORY_CARE_PROVIDER_SITE_OTHER): Payer: Medicare Other | Admitting: Sports Medicine

## 2021-03-14 DIAGNOSIS — M170 Bilateral primary osteoarthritis of knee: Secondary | ICD-10-CM | POA: Diagnosis not present

## 2021-03-14 DIAGNOSIS — R29898 Other symptoms and signs involving the musculoskeletal system: Secondary | ICD-10-CM | POA: Diagnosis not present

## 2021-03-14 DIAGNOSIS — E538 Deficiency of other specified B group vitamins: Secondary | ICD-10-CM

## 2021-03-14 NOTE — Assessment & Plan Note (Signed)
This pleasant 70 year old female returns, we have been treating her for mild osteoarthritis of both knees with meloxicam, she has done extremely well in formal physical therapy and will transition to home exercise program, return as needed. Certainly injections and viscosupplementation can be option in the future.

## 2021-03-14 NOTE — Assessment & Plan Note (Signed)
Hip osteoarthritis on x-rays, symptoms improving with physical therapy. Return as needed.

## 2021-03-14 NOTE — Progress Notes (Signed)
    Procedures performed today:    None.  Independent interpretation of notes and tests performed by another provider:   None.  Brief History, Exam, Impression, and Recommendations:    Primary osteoarthritis of both knees This pleasant 70 year old female returns, we have been treating her for mild osteoarthritis of both knees with meloxicam, she has done extremely well in formal physical therapy and will transition to home exercise program, return as needed. Certainly injections and viscosupplementation can be option in the future.  Weakness of hip Hip osteoarthritis on x-rays, symptoms improving with physical therapy. Return as needed.  Deficiency of vitamin B12 B12 levels were at the bottom of the normal range, homocystine levels were elevated suggesting that there is more of a functional deficiency of folate or B12, she did have oral B12 added, we can recheck homocysteine and methylmalonic acid levels at some point in the future.    ___________________________________________ Gwen Her. Dianah Field, M.D., ABFM., CAQSM. Primary Care and [place] Instructor of [place] of [facility] of Medicine

## 2021-03-14 NOTE — Assessment & Plan Note (Signed)
B12 levels were at the bottom of the normal range, homocystine levels were elevated suggesting that there is more of a functional deficiency of folate or B12, she did have oral B12 added, we can recheck homocysteine and methylmalonic acid levels at some point in the future.

## 2021-03-17 ENCOUNTER — Other Ambulatory Visit: Payer: Self-pay | Admitting: Oncology

## 2021-03-22 ENCOUNTER — Ambulatory Visit (INDEPENDENT_AMBULATORY_CARE_PROVIDER_SITE_OTHER): Payer: Medicare Other | Admitting: Sports Medicine

## 2021-03-22 ENCOUNTER — Encounter: Payer: Self-pay | Admitting: Sports Medicine

## 2021-03-22 DIAGNOSIS — I10 Essential (primary) hypertension: Secondary | ICD-10-CM

## 2021-03-22 DIAGNOSIS — H539 Unspecified visual disturbance: Secondary | ICD-10-CM

## 2021-03-22 DIAGNOSIS — E538 Deficiency of other specified B group vitamins: Secondary | ICD-10-CM

## 2021-03-22 DIAGNOSIS — F4320 Adjustment disorder, unspecified: Secondary | ICD-10-CM

## 2021-03-22 NOTE — Assessment & Plan Note (Addendum)
This is a pleasant 69-year-old female, she has had a few high blood pressures over the past several months to years. More recently while using the bathroom she experienced an episode of flashing shapes in her left visual field, nothing in the right. This lasted about 15 minutes, resolved, her vision returned to normal, she had no concurrent symptoms such as headaches, nausea. She spoke to her eye provider at [facility], they suggested that this was an ocular migraine, and to talk to me about it. She has a regular rate and rhythm pulse, she had a brain MRI few years ago that was normal, extraocular motions are normal, we will check an ESR. We will also work hard on controlling her blood pressure. Of note she is post corneal transplant. 

## 2021-03-22 NOTE — Progress Notes (Signed)
    Procedures performed today:    None.  Independent interpretation of notes and tests performed by another provider:   None.  Brief History, Exam, Impression, and Recommendations:    Visual changes This is a pleasant 70 year old female, she has had a few high blood pressures over the past several months to years. More recently while using the bathroom she experienced an episode of flashing shapes in her left visual field, nothing in the right. This lasted about 15 minutes, resolved, her vision returned to normal, she had no concurrent symptoms such as headaches, nausea. She spoke to her eye provider at [facility], they suggested that this was an ocular migraine, and to talk to me about it. She has a regular rate and rhythm pulse, she had a brain MRI few years ago that was normal, extraocular motions are normal, we will check an ESR. We will also work hard on controlling her blood pressure. Of note she is post corneal transplant.  Benign essential hypertension Blood pressures are under adequate control for a 70 year old.  Anticipatory grieving Having some anticipatory grieving regarding the health of her husband, feels this may be related to her ocular symptoms. We will keep an eye on things, she can call me for referral to grief counseling if needed.    ___________________________________________ Gwen Her. Dianah Field, M.D., ABFM., CAQSM. Primary Care and [place] Instructor of [place] of [facility] of Medicine

## 2021-03-22 NOTE — Assessment & Plan Note (Signed)
Blood pressures are under adequate control for a 70 year old.

## 2021-03-22 NOTE — Assessment & Plan Note (Signed)
Having some anticipatory grieving regarding the health of her husband, feels this may be related to her ocular symptoms. We will keep an eye on things, she can call me for referral to grief counseling if needed.

## 2021-03-26 LAB — CBC
HCT: 41.3 % (ref 35.0–45.0)
Hemoglobin: 14 g/dL (ref 11.7–15.5)
MCH: 30.5 pg (ref 27.0–33.0)
MCHC: 33.9 g/dL (ref 32.0–36.0)
MCV: 90 fL (ref 80.0–100.0)
MPV: 11.4 fL (ref 7.5–12.5)
Platelets: 135 10*3/uL — ABNORMAL LOW (ref 140–400)
RBC: 4.59 10*6/uL (ref 3.80–5.10)
RDW: 12.6 % (ref 11.0–15.0)
WBC: 4.6 10*3/uL (ref 3.8–10.8)

## 2021-03-26 LAB — LIPID PANEL
Cholesterol: 200 mg/dL — ABNORMAL HIGH (ref <200)
HDL: 98 mg/dL (ref >=50)
LDL Cholesterol (Calc): 87 mg/dL (calc)
Non-HDL Cholesterol (Calc): 102 mg/dL (calc) (ref <130)
Total CHOL/HDL Ratio: 2 (calc) (ref <5.0)
Triglycerides: 65 mg/dL (ref <150)

## 2021-03-26 LAB — COMPREHENSIVE METABOLIC PANEL
AG Ratio: 1.6 (calc) (ref 1.0–2.5)
ALT: 11 U/L (ref 6–29)
AST: 18 U/L (ref 10–35)
Albumin: 4.2 g/dL (ref 3.6–5.1)
Alkaline phosphatase (APISO): 34 U/L — ABNORMAL LOW (ref 37–153)
BUN: 21 mg/dL (ref 7–25)
CO2: 28 mmol/L (ref 20–32)
Calcium: 9 mg/dL (ref 8.6–10.4)
Chloride: 106 mmol/L (ref 98–110)
Creat: 0.72 mg/dL (ref 0.60–1.00)
Globulin: 2.6 g/dL (calc) (ref 1.9–3.7)
Glucose, Bld: 79 mg/dL (ref 65–99)
Potassium: 4 mmol/L (ref 3.5–5.3)
Sodium: 144 mmol/L (ref 135–146)
Total Bilirubin: 0.7 mg/dL (ref 0.2–1.2)
Total Protein: 6.8 g/dL (ref 6.1–8.1)

## 2021-03-26 LAB — SEDIMENTATION RATE: Sed Rate: 2 mm/h (ref 0–30)

## 2021-03-26 LAB — HOMOCYSTEINE: Homocysteine: 14.7 umol/L — ABNORMAL HIGH (ref <10.4)

## 2021-03-26 LAB — VITAMIN B12: Vitamin B-12: >2000 pg/mL — ABNORMAL HIGH (ref 200–1100)

## 2021-03-26 LAB — TSH: TSH: 1.29 mIU/L (ref 0.40–4.50)

## 2021-03-26 LAB — METHYLMALONIC ACID, SERUM: Methylmalonic Acid, Quant: 110 nmol/L (ref 87–318)

## 2021-03-28 NOTE — Progress Notes (Signed)
[place]  [phone] [phone]     ID: Joan Lopez DOB: July 01, 1950  MR#: [id number]  XQJ#:[id number]  Patient Care Team: Silverio Decamp, MD as PCP - General (Sports Medicine) Elayah Klooster, Virgie Dad, MD as Consulting Physician (Oncology) Bunnie Pion, MD as Consulting Physician (Ophthalmology) Gery Pray, MD as Consulting Physician (Radiation Oncology) Erroll Luna, MD as Consulting Physician (General Surgery) Chauncey Cruel, MD OTHER MD:  CHIEF COMPLAINT: Estrogen receptor positive breast cancer  CURRENT TREATMENT: Tamoxifen   INTERVAL HISTORY: Joan Lopez returns today for follow up of her estrogen receptor positive breast cancer.  She started tamoxifen on 01/21/2019.  She tolerates this generally well, and hot flashes are not a major issue.  She uses vaginal estrogens pretty much daily, except on Saturdays.  This is helping considerably with the vaginal atrophy symptoms she was experiencing previously.  Since her last visit, she underwent bilateral diagnostic mammography with tomography at [facility] on 08/15/2020 showing: breast density category B; no evidence of malignancy in either breast.    REVIEW OF SYSTEMS: Joan Lopez was not able to complete the [place] and she was evaluated for this by her primary care physician.  He found that she was B12 deficient and this has been replaced.  He also following her homocystine was a bit up, which of course can be due to B12 deficiency or to folate deficiency.  I actually do not see a folate lab.  She has since run a half marathon and did fairly well she says.  She is already scheduled to run in the [place] in 2023.  Aside from that she had an ocular migraine.  It lasted about 15 minutes.  It was not associated with a headache or nausea and it has not recurred.  She does have an appointment with her eye doctor at [facility] on January 26.  A detailed review of systems  today was otherwise negative except as above.   COVID 19 VACCINATION STATUS: [place] x2, most recently 05/2019, no booster as of 05/11/2020   HISTORY OF CURRENT ILLNESS: From the original intake note:  Joan Lopez had routine screening mammography on 09/19/2018 showing a possible abnormality in the right breast. She underwent bilateral diagnostic mammography with tomography and right breast ultrasonography at [facility] on 09/25/2018 showing: breast density category B; suspicious right breast mass in the 1 o'clock position 9 cm from the nipple measuring 6 mm; indeterminate right breast mass in the 12 o'clock position 6 cm from the nipple measuring 9 mm; no suspicious right axillary lymphadenopathy.  Accordingly on 09/26/2018 she proceeded to biopsy of the right breast areas in question. 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 Prognostic indicators significant for: estrogen receptor, 100% positive and progesterone receptor, 10% positive, both with strong staining intensity. Proliferation marker Ki67 at 5%. HER2 equivocal by immunohistochemistry (2+), but negative by fluorescent in situ hybridization with a signals ratio 1.16 and number per cell 1.80.  Biopsy of the right breast 12 o'clock location from the same reports showed fibroadenoma, sclerotic, with no malignancy identified.  The patient's subsequent history is as detailed below.   PAST MEDICAL HISTORY: Past Medical History:  Diagnosis Date   Abnormal LFTs 10/29/2012   Acquired bilateral renal cysts    Arthritis    Bilateral bunions    Constipation 10/16/2011   DDD (degenerative disc disease)    spine  w narrowing   Dehydration, mild 12/26/2011   Diverticulitis    Exercise counseling 11/14/2011   Family history of brain cancer    Family history of breast cancer    Family history of cancer    Family history of colon cancer     Family history of kidney cancer    Family history of oral cancer    Family history of stomach cancer    Fuch's endothelial dystrophy    Guttate psoriasis    Hematuria 08/24/2011   Hemorrhoids    History of chicken pox    Hyperlipidemia    Kidney stones    Lipoma of arm 07/13/2012   Lipoma of arm 10/07/2012   Left    Mass of arm 07/13/2012   Has had them in past     Personal history of radiation therapy    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   Post-menopausal bleeding 10/08/2011   Reflux 04/17/2012   Renal insufficiency 01/11/2013   Thrombocytopenia ([place]) 09/27/2011    PAST SURGICAL HISTORY: Past Surgical History:  Procedure Laterality Date   APPENDECTOMY  1986   BREAST LUMPECTOMY Right 10/2018   BREAST LUMPECTOMY WITH RADIOACTIVE SEED AND SENTINEL LYMPH NODE BIOPSY Right 10/24/2018   Procedure: RIGHT BREAST LUMPECTOMY WITH RADIOACTIVE SEED AND RIGHT AXILLARY SENTINEL LYMPH NODE BIOPSY, RIGHT BLUE DYE INJECTION;  Surgeon: Fanny Skates, MD;  Location: [facility];  Service: General;  Laterality: Right;  [place]     COLONOSCOPY  2005   (records here)[place] - diverticulosis and hemorrhoids   CORNEAL TRANSPLANT  2013   CYSTOSCOPY WITH RETROGRADE PYELOGRAM, URETEROSCOPY AND STENT PLACEMENT Right 10/22/2013   Procedure: CYSTOSCOPY WITH RETROGRADE PYELOGRAM, URETEROSCOPY ;  Surgeon: Raynelle Bring, MD;  Location: [facility];  Service: Urology;  Laterality: Right;   LIPOMA EXCISION  2014   x 3, both arms   TONSILLECTOMY     possible adenoids   TOTAL ABDOMINAL HYSTERECTOMY  1991   for fibroids   TUBAL LIGATION     WRIST FRACTURE SURGERY     right, titanium plate and pins    FAMILY HISTORY: Family History  Problem Relation Age of Onset   Stroke Mother    Heart disease Mother    Hypertension Mother    Kidney disease Father    Breast cancer Sister 71   Cancer Sister 49       ductal carcinoma breast- had it in 1999 and just found  out its in the opposite breast   Brain cancer Maternal Grandmother 55       brain tumor   Breast cancer Paternal Grandmother 48       post menopausal    Pneumonia Maternal Grandfather    Colon cancer Maternal Aunt 92       40s or 5s   Cancer Paternal Uncle 64       neck tumor   Stomach cancer Maternal Aunt 80       80s   Kidney cancer Cousin 30       30s, maternal first cousin   Cancer Paternal Aunt        blood cancer, unknown but older age at diagnosis   Cancer Maternal Aunt 70       unknown, but not breast, 56s   Cancer Cousin        oral cancer diagnosed older, maternal first cousin   Esophageal cancer Neg Hx    Pancreatic cancer  Neg Hx    Rectal cancer Neg Hx   Patient's father was 109 years old when he died from kidney disease. Patient's mother died from old age at age 54, possibly something related to her colon.  The patient has 1 sister, no brothers.  Patient has a family history of breast cancer in her sister, diagnosed in her 31s with recurrence more than 5 years later, and in her paternal grandmother. Both were diagnosed following menopause. Her sister underwent bilateral mastectomies. The patient denies a family hx of ovarian, prostate, or pancreatic cancer.  She also notes: colon cancer in a maternal aunt at age 62; stomach cancer in another maternal aunt; a neck tumor in a paternal uncle; a brain tumor in her maternal grandfather.   GYNECOLOGIC HISTORY:  No LMP recorded. Patient has had a hysterectomy. Menarche: 70 years old Age at first live birth: 70 years old [place] P 1 LMP 1991 Contraceptive: IUD, remote HRT estrogen approximately one month Hysterectomy? Yes, 1991 BSO? 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 Husband Stefanie Libel") is a retired Engineer, structural. He has significant health issues.  At home it is just the 2 of them.  Daughter Gay Filler is a Associate Professor at [employer] and has two daughters,  Harmon Pier and Chrys Racer (the patient's granddaughters). Chrys Racer is very interested in medicine.  The patient is not a church attender    ADVANCED DIRECTIVES: Currently, husband Barnabas Lister is automaticcaly her HCPOA.    HEALTH MAINTENANCE: Social History   Tobacco Use   Smoking status: Never   Smokeless tobacco: Never  Substance Use Topics   Alcohol use: No   Drug use: No     Colonoscopy: 09/2013, Dr. Carlean Purl, repeat 2025  PAP: 09/2011, normal  Bone density: 05/2014, -2.2   Allergies  Allergen Reactions   Augmentin [Amoxicillin-Pot Clavulanate]     Severe yeast infection-would prefer not to take.    Current Outpatient Medications  Medication Sig Dispense Refill   calcium carbonate (OSCAL) 1500 (600 Ca) MG TABS tablet Take 1 tablet (1,500 mg total) by mouth 2 (two) times daily with a meal.     loteprednol (LOTEMAX) 0.5 % ophthalmic suspension Place 1 drop into both eyes as directed. Reported on 06/07/2015     PREMARIN vaginal cream PLACE 1 APPLICATORFUL VAGINALLY DAILY. 30 g 12   tamoxifen (NOLVADEX) 20 MG tablet TAKE 1 TABLET BY MOUTH EVERY DAY 90 tablet 0   vitamin B-12 (CYANOCOBALAMIN) 1000 MCG tablet Take 1 tablet (1,000 mcg total) by mouth daily. 90 tablet 3   No current facility-administered medications for this visit.    OBJECTIVE: white woman who appears younger than stated age  30:   03/29/21 1008  BP: (!) 159/81  Pulse: 84  Resp: 18  Temp: 97.9 F (36.6 C)  SpO2: 100%      Body mass index is 19.9 kg/m.   Wt Readings from Last 3 Encounters:  03/29/21 119 lb 9.6 oz (54.3 kg)  05/11/20 132 lb 9.6 oz (60.1 kg)  01/18/20 130 lb 1.9 oz (59 kg)     ECOG FS:1 - Symptomatic but completely ambulatory  Sclerae unicteric, EOMs intact Wearing a mask No cervical or supraclavicular adenopathy Lungs no rales or rhonchi Heart regular rate and rhythm Abd soft, nontender, positive bowel sounds MSK no focal spinal tenderness, no upper extremity lymphedema Neuro: nonfocal, well  oriented, appropriate affect Breasts: The right breast is status postlumpectomy and radiation.  There is some skin coarsening but overall the cosmetic result is good and there is no evidence of local recurrence.  The left breast and both axillae are benign.   LAB RESULTS:  CMP     Component Value Date/Time   NA 144 03/22/2021 0000   K 4.0 03/22/2021 0000   CL 106 03/22/2021 0000   CO2 28 03/22/2021 0000   GLUCOSE 79 03/22/2021 0000   BUN 21 03/22/2021 0000   CREATININE 0.72 03/22/2021 0000   CALCIUM 9.0 03/22/2021 0000   PROT 6.8 03/22/2021 0000   ALBUMIN 3.8 05/11/2020 1500   AST 18 03/22/2021 0000   ALT 11 03/22/2021 0000   ALKPHOS 36 (L) 05/11/2020 1500   BILITOT 0.7 03/22/2021 0000   GFRNONAA >60 05/11/2020 1500   GFRNONAA >89 04/02/2011 1037   GFRAA >60 05/11/2019 1448   GFRAA >89 04/02/2011 1037    Lab Results  Component Value Date   WBC 4.6 03/22/2021   NEUTROABS 3.1 05/11/2020   HGB 14.0 03/22/2021   HCT 41.3 03/22/2021   MCV 90.0 03/22/2021   PLT 135 (L) 03/22/2021   No results found for: LABCA2  No components found for: LKGMWN027  No results for input(s): INR in the last 168 hours.  No results found for: LABCA2  No results found for: OZD664  No results found for: QIH474  No results found for: QVZ563  No results found for: CA2729  No components found for: HGQUANT  No results found for: CEA1 / No results found for: CEA1   No results found for: AFPTUMOR  No results found for: CHROMOGRNA  No results found for: TOTALPROTELP, ALBUMINELP, A1GS, A2GS, BETS, BETA2SER, GAMS, MSPIKE, SPEI (this displays SPEP labs)  No results found for: KPAFRELGTCHN, LAMBDASER, KAPLAMBRATIO (kappa/lambda light chains)  No results found for: HGBA, HGBA2QUANT, HGBFQUANT, HGBSQUAN (Hemoglobinopathy evaluation)   No results found for: LDH  No results found for: IRON, TIBC, IRONPCTSAT (Iron and TIBC)  No results found for: FERRITIN  Urinalysis    Component  Value Date/Time   COLORURINE YELLOW 10/07/2012 1331   APPEARANCEUR CLEAR 10/07/2012 1331   LABSPEC 1.020 10/07/2012 1331   PHURINE 6.0 10/07/2012 1331   GLUCOSEU NEG 10/07/2012 1331   GLUCOSEU NEG mg/dL 04/02/2007 2006   HGBUR NEG 10/07/2012 1331   HGBUR large 07/25/2009 1427   BILIRUBINUR neg 03/01/2015 1201   KETONESUR NEG 10/07/2012 1331   PROTEINUR neg 03/01/2015 1201   PROTEINUR NEG 10/07/2012 1331   UROBILINOGEN 0.2 03/01/2015 1201   UROBILINOGEN 0.2 10/07/2012 1331   NITRITE neg 03/01/2015 1201   NITRITE NEG 10/07/2012 1331   LEUKOCYTESUR Negative 03/01/2015 1201    STUDIES: No results found.   ELIGIBLE FOR AVAILABLE RESEARCH PROTOCOL: no  ASSESSMENT: 70 y.o. [place], [place] woman status post right breast upper outer quadrant biopsy 09/26/2018 for a clinical T1BN0, stage Ia invasive ductal carcinoma, grade 1, estrogen and progesterone receptor positive, HER-2 negative, with an MIB-1 of 5%  (1) status post right lumpectomy 10/24/2018 for a pT1b pN0, stage IA invasive ductal carcinoma, grade 1, with negative margins  (a) a total of 3 right axillary sentinel lymph nodes removed  (2) no chemotherapy planned, no Oncotype indicated  (3) adjuvant radiation 12/01/2018 - 12/29/2018  (a) Right Breast / 40.05 Gy in 15 fractions  (b) Right Breast Boost / 10 Gy in 5 fractions  (4) tamoxifen started October 2020  (a) bone density 05/19/2014 showed a T score of -2.2  (b) status post remote hysterectomy  (  c) repeat bone density 03/05/2019 showed a T score of -2.5  (5) genetics testing 11/06/2018 through the [facility]Cancer panel.found no deleterious mutations in AIP, ALK, APC, ATM, AXIN2,BAP1,  BARD1, BLM, BMPR1A, BRCA1, BRCA2, BRIP1, CASR, CDC73, CDH1, CDK4, CDKN1B, CDKN1C, CDKN2A (p14ARF), CDKN2A (p16INK4a), CEBPA, CHEK2, CTNNA1, DICER1, DIS3L2, EGFR (c.2369C>T, p.Thr790Met variant only), EPCAM (Deletion/duplication testing only), FH, FLCN, GATA2, GPC3, GREM1 (Promoter region  deletion/duplication testing only), HOXB13 (c.251G>A, p.Gly84Glu), HRAS, KIT, MAX, MEN1, MET, MITF (c.952G>A, p.Glu318Lys variant only), MLH1, MSH2, MSH3, MSH6, MUTYH, NBN, NF1, NF2, NTHL1, PALB2, PDGFRA, PHOX2B, PMS2, POLD1, POLE, POT1, PRKAR1A, PTCH1, PTEN, RAD50, RAD51C, RAD51D, RB1, RECQL4, RET, RNF43, RUNX1, SDHAF2, SDHA (sequence changes only), SDHB, SDHC, SDHD, SMAD4, SMARCA4, SMARCB1, SMARCE1, STK11, SUFU, TERC, TERT, TMEM127, TP53, TSC1, TSC2, VHL, WRN and WT1.     PLAN: Joan Lopez is now 2-1/2 years out from definitive surgery for her breast cancer with no evidence of disease recurrence.  This is very favorable.  She is tolerating tamoxifen well and the plan is to continue that a minimum of 5 years.  She might want to continue it to a total of 10 since she is also using vaginal estrogens and I would be uncomfortable continuing those when she stops tamoxifen.  I think it would be prudent for her to replace folate and I placed that prescription in for her.  I urged her if she has any other visual symptoms to call her [facility] ophthalmologist and insist on being seen within the week.  She normally would see her surgeon in 6 months from this visit but her surgeon retired so she is seeing [name] pretty much every 6 months.  She will have her next mammogram in May.  She will see [name] after that for routine follow-up.  Total encounter time 25 minutes.Chauncey Cruel, MD   03/29/2021 10:43 AM Medical Oncology and Hematology [facility] [place], [place] [address] Tel. [phone]    Fax. [phone]   This document serves as a record of services personally performed by Lurline Del, MD. It was created on his behalf by Wilburn Mylar, a trained medical scribe. The creation of this record is based on the scribe's personal observations and the provider's statements to them.   I, Lurline Del MD, have reviewed the above documentation for accuracy and  completeness, and I agree with the above.   *Total Encounter Time as defined by the Centers for Medicare and Medicaid Services includes, in addition to the face-to-face time of a patient visit (documented in the note above) non-face-to-face time: obtaining and reviewing outside history, ordering and reviewing medications, tests or procedures, care coordination (communications with other health care professionals or caregivers) and documentation in the medical record.

## 2021-03-29 ENCOUNTER — Other Ambulatory Visit: Payer: Self-pay

## 2021-03-29 ENCOUNTER — Other Ambulatory Visit: Payer: Medicare Other

## 2021-03-29 ENCOUNTER — Inpatient Hospital Stay: Payer: Medicare Other | Attending: Oncology | Admitting: Oncology

## 2021-03-29 VITALS — BP 159/81 | HR 84 | Temp 97.9°F | Resp 18 | Ht 65.0 in | Wt 119.6 lb

## 2021-03-29 DIAGNOSIS — C50811 Malignant neoplasm of overlapping sites of right female breast: Secondary | ICD-10-CM | POA: Insufficient documentation

## 2021-03-29 DIAGNOSIS — M818 Other osteoporosis without current pathological fracture: Secondary | ICD-10-CM

## 2021-03-29 DIAGNOSIS — Z79810 Long term (current) use of selective estrogen receptor modulators (SERMs): Secondary | ICD-10-CM | POA: Insufficient documentation

## 2021-03-29 DIAGNOSIS — Z170 Estrogen receptor positive status [ER+]: Secondary | ICD-10-CM | POA: Diagnosis not present

## 2021-03-29 DIAGNOSIS — D81818 Other biotin-dependent carboxylase deficiency: Secondary | ICD-10-CM

## 2021-03-29 DIAGNOSIS — C50211 Malignant neoplasm of upper-inner quadrant of right female breast: Secondary | ICD-10-CM

## 2021-04-05 ENCOUNTER — Other Ambulatory Visit: Payer: Self-pay | Admitting: *Deleted

## 2021-04-05 MED ORDER — FOLIC ACID 1 MG PO TABS: 1.0000 mg | ORAL_TABLET | Freq: Every day | ORAL | 3 refills | Status: AC

## 2021-04-23 ENCOUNTER — Other Ambulatory Visit: Payer: Self-pay | Admitting: Sports Medicine

## 2021-04-23 DIAGNOSIS — M5412 Radiculopathy, cervical region: Secondary | ICD-10-CM

## 2021-04-27 ENCOUNTER — Emergency Department (INDEPENDENT_AMBULATORY_CARE_PROVIDER_SITE_OTHER): Payer: Medicare Other

## 2021-04-27 ENCOUNTER — Emergency Department (INDEPENDENT_AMBULATORY_CARE_PROVIDER_SITE_OTHER)
Admission: EM | Admit: 2021-04-27 | Discharge: 2021-04-27 | Disposition: A | Discharge status: Home / Self Care | Payer: Medicare Other | Source: Home / Self Care | Attending: Family Medicine | Admitting: Family Medicine

## 2021-04-27 ENCOUNTER — Other Ambulatory Visit: Payer: Self-pay

## 2021-04-27 DIAGNOSIS — M2011 Hallux valgus (acquired), right foot: Secondary | ICD-10-CM | POA: Diagnosis not present

## 2021-04-27 DIAGNOSIS — S92351A Displaced fracture of fifth metatarsal bone, right foot, initial encounter for closed fracture: Secondary | ICD-10-CM | POA: Diagnosis not present

## 2021-04-27 DIAGNOSIS — M7731 Calcaneal spur, right foot: Secondary | ICD-10-CM | POA: Diagnosis not present

## 2021-04-27 DIAGNOSIS — S92354A Nondisplaced fracture of fifth metatarsal bone, right foot, initial encounter for closed fracture: Secondary | ICD-10-CM

## 2021-04-27 DIAGNOSIS — M79671 Pain in right foot: Secondary | ICD-10-CM

## 2021-04-27 NOTE — ED Provider Notes (Signed)
[facility]    CSN: [id number] Arrival date & time: 04/27/21  1102      History   Chief Complaint Chief Complaint  Patient presents with   Foot Injury    rt    HPI Joan Lopez is a 71 y.o. female.   HPI  Patient is a very healthy 71 year old.  She runs on a regular basis and is currently training for a half marathon in April, and then a marathon in September in [place].  She is excited about her plans.  She usually runs on the street.  She thought today she would try a different route and run cross-country.  While she was Trail running she missed stepped and felt a pop and pain in her right foot Patient has known osteoporosis She is on calcium supplement  Past Medical History:  Diagnosis Date   Abnormal LFTs 10/29/2012   Acquired bilateral renal cysts    Arthritis    Bilateral bunions    Constipation 10/16/2011   DDD (degenerative disc disease)    spine w narrowing   Dehydration, mild 12/26/2011   Diverticulitis    Exercise counseling 11/14/2011   Family history of brain cancer    Family history of breast cancer    Family history of cancer    Family history of colon cancer    Family history of kidney cancer    Family history of oral cancer    Family history of stomach cancer    Fuch's endothelial dystrophy    Guttate psoriasis    Hematuria 08/24/2011   Hemorrhoids    History of chicken pox    Hyperlipidemia    Kidney stones    Lipoma of arm 07/13/2012   Lipoma of arm 10/07/2012   Left    Mass of arm 07/13/2012   Has had them in past     Personal history of radiation therapy    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   Post-menopausal bleeding 10/08/2011   Reflux 04/17/2012   Renal insufficiency 01/11/2013   Thrombocytopenia ([place]) 09/27/2011    Patient Active Problem List   Diagnosis Date Noted   Visual changes 03/22/2021   Anticipatory grieving 03/22/2021   Deficiency of vitamin B12 02/08/2021   Weakness of hip 01/17/2021   Hearing loss due to cerumen  impaction, right 01/18/2020   Vaginal irritation 01/18/2020   Osteoporosis 03/10/2019   Genetic testing 11/06/2018   Tick bite 11/03/2018   Family history of breast cancer    Family history of colon cancer    Family history of stomach cancer    Family history of brain cancer    Family history of kidney cancer    Family history of oral cancer    Family history of cancer    Malignant neoplasm of upper-inner quadrant of right breast in female, estrogen receptor positive ([place]) 09/29/2018   Plantar fasciitis, right 08/15/2018   Left leg pain 11/07/2015   Myalgia 11/07/2015   Exercise counseling 03/01/2015   Dysfunction of right rotator cuff 12/14/2014   Skin tag 12/14/2014   Tongue lesion 06/14/2014   Primary osteoarthritis of both knees 05/17/2014   Annual physical exam 08/20/2013   Benign essential hypertension 08/20/2013   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   Reflux 04/17/2012   Post-menopausal bleeding 10/08/2011   Thrombocytopenia ([place]) 09/27/2011   Hematuria 08/24/2011   Trigeminal neuralgia 02/06/2011   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   Radiculitis of left cervical region 04/06/2009   Generalized anxiety disorder 05/31/2008  Past Surgical History:  Procedure Laterality Date   APPENDECTOMY  1986   BREAST LUMPECTOMY Right 10/2018   BREAST LUMPECTOMY WITH RADIOACTIVE SEED AND SENTINEL LYMPH NODE BIOPSY Right 10/24/2018   Procedure: RIGHT BREAST LUMPECTOMY WITH RADIOACTIVE SEED AND RIGHT AXILLARY SENTINEL LYMPH NODE BIOPSY, RIGHT BLUE DYE INJECTION;  Surgeon: Fanny Skates, MD;  Location: [facility];  Service: General;  Laterality: Right;  PEC BLOCK   CESAREAN SECTION     CHOLECYSTECTOMY     COLONOSCOPY  2005   (records here)[place] - diverticulosis and hemorrhoids   CORNEAL TRANSPLANT  2013   CYSTOSCOPY WITH RETROGRADE PYELOGRAM, URETEROSCOPY AND STENT PLACEMENT Right 10/22/2013   Procedure: CYSTOSCOPY WITH RETROGRADE PYELOGRAM, URETEROSCOPY ;   Surgeon: Raynelle Bring, MD;  Location: [facility];  Service: Urology;  Laterality: Right;   LIPOMA EXCISION  2014   x 3, both arms   TONSILLECTOMY     possible adenoids   TOTAL ABDOMINAL HYSTERECTOMY  1991   for fibroids   TUBAL LIGATION     WRIST FRACTURE SURGERY     right, titanium plate and pins    OB History   No obstetric history on file.      Home Medications    Prior to Admission medications   Medication Sig Start Date End Date Taking? Authorizing Provider  calcium carbonate (OSCAL) 1500 (600 Ca) MG TABS tablet Take 1 tablet (1,500 mg total) by mouth 2 (two) times daily with a meal. 05/11/19   Magrinat, Virgie Dad, MD  folic acid (FOLVITE) 1 MG tablet Take 1 tablet (1 mg total) by mouth daily. 04/05/21   Magrinat, Virgie Dad, MD  loteprednol (LOTEMAX) 0.5 % ophthalmic suspension Place 1 drop into both eyes as directed. Reported on 06/07/2015 08/21/12   Mosie Lukes, MD  PREMARIN vaginal cream PLACE 1 APPLICATORFUL VAGINALLY DAILY. 02/06/21   Silverio Decamp, MD  tamoxifen (NOLVADEX) 20 MG tablet TAKE 1 TABLET BY MOUTH EVERY DAY 03/17/21   Magrinat, Virgie Dad, MD  vitamin B-12 (CYANOCOBALAMIN) 1000 MCG tablet Take 1 tablet (1,000 mcg total) by mouth daily. 02/08/21   Silverio Decamp, MD    Family History Family History  Problem Relation Age of Onset   Stroke Mother    Heart disease Mother    Hypertension Mother    Kidney disease Father    Breast cancer Sister 29   Cancer Sister 45       ductal carcinoma breast- had it in 1999 and just found out its in the opposite breast   Brain cancer Maternal Grandmother 55       brain tumor   Breast cancer Paternal Grandmother 59       post menopausal    Pneumonia Maternal Grandfather    Colon cancer Maternal Aunt 5       40s or 58s   Cancer Paternal Uncle 64       neck tumor   Stomach cancer Maternal Aunt 80       80s   Kidney cancer Cousin 30       30s, maternal first cousin   Cancer Paternal Aunt        blood  cancer, unknown but older age at diagnosis   Cancer Maternal Aunt 70       unknown, but not breast, 11s   Cancer Cousin        oral cancer diagnosed older, maternal first cousin   Esophageal cancer Neg Hx    Pancreatic cancer  Neg Hx    Rectal cancer Neg Hx     Social History Social History   Tobacco Use   Smoking status: Never   Smokeless tobacco: Never  Substance Use Topics   Alcohol use: No   Drug use: No     Allergies   Augmentin [amoxicillin-pot clavulanate]   Review of Systems Review of Systems See HPI  Physical Exam Triage Vital Signs ED Triage Vitals  Enc Vitals Group     BP 04/27/21 1111 (!) 147/84     Pulse Rate 04/27/21 1111 77     Resp 04/27/21 1111 14     Temp 04/27/21 1111 98.2 F (36.8 C)     Temp Source 04/27/21 1111 Oral     SpO2 04/27/21 1111 97 %     Weight --      Height --      Head Circumference --      Peak Flow --      Pain Score 04/27/21 1112 5     Pain Loc --      Pain Edu? --      Excl. in [place]? --    No data found.  Updated Vital Signs BP (!) 147/84 (BP Location: Right Arm)    Pulse 77    Temp 98.2 F (36.8 C) (Oral)    Resp 14    SpO2 97%   Physical Exam Constitutional:      General: She is not in acute distress.    Appearance: She is well-developed.  HENT:     Head: Normocephalic and atraumatic.  Eyes:     Conjunctiva/sclera: Conjunctivae normal.     Pupils: Pupils are equal, round, and reactive to light.  Cardiovascular:     Rate and Rhythm: Normal rate.  Pulmonary:     Effort: Pulmonary effort is normal. No respiratory distress.  Abdominal:     General: There is no distension.     Palpations: Abdomen is soft.  Musculoskeletal:        General: Normal range of motion.     Cervical back: Normal range of motion.       Feet:  Skin:    General: Skin is warm and dry.  Neurological:     Mental Status: She is alert.     Gait: Gait abnormal.     UC Treatments / Results  Labs (all labs ordered are listed, but  only abnormal results are displayed) Labs Reviewed - No data to display  EKG   Radiology DG Foot Complete Right  Result Date: 04/27/2021 CLINICAL DATA:  Right foot discomfort after a popping injury while trail running this morning EXAM: RIGHT FOOT COMPLETE - 3 VIEW COMPARISON:  MRI 02/24/2019 FINDINGS: Acute transverse avulsion fracture the base of the fifth metatarsal. Mild hallux valgus. Plantar calcaneal spur. Mild dorsal spurring along the medial cuneiform. IMPRESSION: 1. Acute transverse avulsion fracture of the base of the fifth metatarsal. 2. Mild hallux valgus. 3. Plantar calcaneal spur. Electronically Signed   By: Van Clines M.D.   On: 04/27/2021 11:28    Procedures Procedures (including critical care time)  Medications Ordered in UC Medications - No data to display  Initial Impression / Assessment and Plan / UC Course  I have reviewed the triage vital signs and the nursing notes.  Pertinent labs & imaging results that were available during my care of the patient were reviewed by me and considered in my medical decision making (see chart for details).  Discussed with Dr. Dianah Field.  He recommends fracture shoe, limited weightbearing, alternate exercise, follow-up in 4 weeks Final Clinical Impressions(s) / UC Diagnoses   Final diagnoses:  Nondisplaced fracture of fifth metatarsal bone, right foot, initial encounter for closed fracture     Discharge Instructions      Use ice and elevation for pain and swelling Take Mobic for pain To Dr. Darene Lamer in 4 weeks Wear fracture shoe at all times when upright     ED Prescriptions   None    PDMP not reviewed this encounter.   Raylene Everts, MD 04/27/21 [phone]

## 2021-04-27 NOTE — Discharge Instructions (Addendum)
Use ice and elevation for pain and swelling Take Mobic for pain To Dr. Darene Lamer in 4 weeks Wear fracture shoe at all times when upright

## 2021-04-27 NOTE — ED Triage Notes (Signed)
Pt presents with rt foot discomfort after hearing a "pop" while trail running this morning

## 2021-05-11 DIAGNOSIS — H539 Unspecified visual disturbance: Secondary | ICD-10-CM | POA: Diagnosis not present

## 2021-05-25 ENCOUNTER — Ambulatory Visit: Payer: Medicare Other | Admitting: Sports Medicine

## 2021-05-26 ENCOUNTER — Other Ambulatory Visit: Payer: Self-pay | Admitting: Sports Medicine

## 2021-05-26 ENCOUNTER — Ambulatory Visit (INDEPENDENT_AMBULATORY_CARE_PROVIDER_SITE_OTHER): Payer: Medicare Other

## 2021-05-26 ENCOUNTER — Other Ambulatory Visit: Payer: Self-pay

## 2021-05-26 ENCOUNTER — Ambulatory Visit (INDEPENDENT_AMBULATORY_CARE_PROVIDER_SITE_OTHER): Payer: Medicare Other | Admitting: Sports Medicine

## 2021-05-26 DIAGNOSIS — R519 Headache, unspecified: Secondary | ICD-10-CM

## 2021-05-26 DIAGNOSIS — G43109 Migraine with aura, not intractable, without status migrainosus: Secondary | ICD-10-CM | POA: Diagnosis not present

## 2021-05-26 DIAGNOSIS — N952 Postmenopausal atrophic vaginitis: Secondary | ICD-10-CM

## 2021-05-26 DIAGNOSIS — G8929 Other chronic pain: Secondary | ICD-10-CM | POA: Diagnosis not present

## 2021-05-26 DIAGNOSIS — S92351D Displaced fracture of fifth metatarsal bone, right foot, subsequent encounter for fracture with routine healing: Secondary | ICD-10-CM

## 2021-05-26 DIAGNOSIS — S92351A Displaced fracture of fifth metatarsal bone, right foot, initial encounter for closed fracture: Secondary | ICD-10-CM | POA: Diagnosis not present

## 2021-05-26 DIAGNOSIS — M818 Other osteoporosis without current pathological fracture: Secondary | ICD-10-CM | POA: Diagnosis not present

## 2021-05-26 MED ORDER — MELOXICAM 15 MG PO TABS: ORAL_TABLET | ORAL | 3 refills | Status: DC

## 2021-05-26 NOTE — Assessment & Plan Note (Signed)
Occasional ocular migraine headaches, history of breast cancer, her optometrist did suggest that she get a brain MRI with and without contrast, happy to order this. She will need some renal function.

## 2021-05-26 NOTE — Assessment & Plan Note (Addendum)
Doing well with Premarin cream, it is a bit expensive, I would like her to look into generic Estrace cream, she will let me know where she would like me to send it.

## 2021-05-26 NOTE — Progress Notes (Signed)
° ° °  Procedures performed today:    None.  Independent interpretation of notes and tests performed by another provider:   None.  Brief History, Exam, Impression, and Recommendations:    Closed fracture of base of fifth metatarsal bone with routine healing, right 4 weeks ago was doing a trail run, took a misstep and injured right foot, x-rays in urgent care showed a minimally comminuted fracture base of right fifth metatarsal, she is doing okay today, continues postop shoe, does have some swelling and pain. I have cleared her to cross train on a stationary bike but no running for 4 weeks, return to see me in 4 weeks, repeat x-rays today.  Ocular migraine Occasional ocular migraine headaches, history of breast cancer, her optometrist did suggest that she get a brain MRI with and without contrast, happy to order this. She will need some renal function.  Atrophic vaginitis Doing well with Premarin cream, it is a bit expensive, I would like her to look into generic Estrace cream, she will let me know where she would like me to send it.    ___________________________________________ Gwen Her. Dianah Field, M.D., ABFM., CAQSM. Primary Care and [place] Instructor of [place] of [facility] of Medicine

## 2021-05-26 NOTE — Assessment & Plan Note (Signed)
4 weeks ago was doing a trail run, took a misstep and injured right foot, x-rays in urgent care showed a minimally comminuted fracture base of right fifth metatarsal, she is doing okay today, continues postop shoe, does have some swelling and pain. I have cleared her to cross train on a stationary bike but no running for 4 weeks, return to see me in 4 weeks, repeat x-rays today.

## 2021-05-27 LAB — BASIC METABOLIC PANEL WITH GFR
BUN: 20 mg/dL (ref 7–25)
CO2: 30 mmol/L (ref 20–32)
Calcium: 9 mg/dL (ref 8.6–10.4)
Chloride: 106 mmol/L (ref 98–110)
Creat: 0.79 mg/dL (ref 0.60–1.00)
Glucose, Bld: 99 mg/dL (ref 65–99)
Potassium: 4 mmol/L (ref 3.5–5.3)
Sodium: 143 mmol/L (ref 135–146)
eGFR: 79 mL/min/{1.73_m2} (ref >=60)

## 2021-05-31 ENCOUNTER — Ambulatory Visit (INDEPENDENT_AMBULATORY_CARE_PROVIDER_SITE_OTHER): Payer: Medicare Other

## 2021-05-31 ENCOUNTER — Other Ambulatory Visit: Payer: Self-pay

## 2021-05-31 DIAGNOSIS — M818 Other osteoporosis without current pathological fracture: Secondary | ICD-10-CM | POA: Diagnosis not present

## 2021-05-31 DIAGNOSIS — M85852 Other specified disorders of bone density and structure, left thigh: Secondary | ICD-10-CM | POA: Diagnosis not present

## 2021-05-31 DIAGNOSIS — M810 Age-related osteoporosis without current pathological fracture: Secondary | ICD-10-CM | POA: Diagnosis not present

## 2021-05-31 DIAGNOSIS — Z780 Asymptomatic menopausal state: Secondary | ICD-10-CM | POA: Diagnosis not present

## 2021-06-12 ENCOUNTER — Ambulatory Visit (INDEPENDENT_AMBULATORY_CARE_PROVIDER_SITE_OTHER): Payer: Medicare Other

## 2021-06-12 ENCOUNTER — Other Ambulatory Visit: Payer: Self-pay

## 2021-06-12 DIAGNOSIS — G8929 Other chronic pain: Secondary | ICD-10-CM

## 2021-06-12 DIAGNOSIS — G319 Degenerative disease of nervous system, unspecified: Secondary | ICD-10-CM | POA: Diagnosis not present

## 2021-06-12 DIAGNOSIS — G43109 Migraine with aura, not intractable, without status migrainosus: Secondary | ICD-10-CM | POA: Diagnosis not present

## 2021-06-12 DIAGNOSIS — R519 Headache, unspecified: Secondary | ICD-10-CM

## 2021-06-12 DIAGNOSIS — G43909 Migraine, unspecified, not intractable, without status migrainosus: Secondary | ICD-10-CM | POA: Diagnosis not present

## 2021-06-12 MED ORDER — GADOBUTROL 1 MMOL/ML IV SOLN
6.0000 mL | Freq: Once | INTRAVENOUS | Status: AC | PRN
  Administered 2021-06-12: 6 mL via INTRAVENOUS

## 2021-06-23 ENCOUNTER — Other Ambulatory Visit: Payer: Self-pay

## 2021-06-23 ENCOUNTER — Ambulatory Visit (INDEPENDENT_AMBULATORY_CARE_PROVIDER_SITE_OTHER): Payer: Medicare Other | Admitting: Sports Medicine

## 2021-06-23 DIAGNOSIS — S92351D Displaced fracture of fifth metatarsal bone, right foot, subsequent encounter for fracture with routine healing: Secondary | ICD-10-CM | POA: Diagnosis not present

## 2021-06-23 NOTE — Assessment & Plan Note (Signed)
This pleasant 71 year old female is now approximately 8 weeks post fracture of the fifth metatarsal base, she is pain-free, we can discontinue immobilization. ?Her previous running regimen was 5 miles at a time and 15 to 20 miles per week. ?Week 1: Start back at 3 miles per session with a 3-minute walk/3-minute run. ?Week 2: Increase to 4 miles per session with a 3-minute walk/3-minute run. ?Week 3: Increase to 5 miles per session with a 3-minute walk/3-minute run. ?Week 4: Continue 5 miles per session, all running. ?

## 2021-06-23 NOTE — Progress Notes (Signed)
? ? ?  Procedures performed today:   ? ?None. ? ?Independent interpretation of notes and tests performed by another provider:  ? ?None. ? ?Brief History, Exam, Impression, and Recommendations:   ? ?Closed fracture of base of fifth metatarsal bone with routine healing, right ?This pleasant 71 year old female is now approximately 8 weeks post fracture of the fifth metatarsal base, she is pain-free, we can discontinue immobilization. ?Her previous running regimen was 5 miles at a time and 15 to 20 miles per week. ?Week 1: Start back at 3 miles per session with a 3-minute walk/3-minute run. ?Week 2: Increase to 4 miles per session with a 3-minute walk/3-minute run. ?Week 3: Increase to 5 miles per session with a 3-minute walk/3-minute run. ?Week 4: Continue 5 miles per session, all running. ? ? ? ?___________________________________________ ?Gwen Her. Dianah Field, M.D., ABFM., CAQSM. ?Primary Care and Sports Medicine ?[place] ? ?Adjunct Instructor of Family Medicine  ?[employer] ?

## 2021-06-23 NOTE — Patient Instructions (Signed)
Week 1: Start back at 3 miles per session with a 3-minute walk/3-minute run. ?Week 2: Increase to 4 miles per session with a 3-minute walk/3-minute run. ?Week 3: Increase to 5 miles per session with a 3-minute walk/3-minute run. ?Week 4: Continue 5 miles per session, all running. ?

## 2021-07-03 ENCOUNTER — Other Ambulatory Visit: Payer: Self-pay | Admitting: Hematology and Oncology

## 2021-07-04 ENCOUNTER — Other Ambulatory Visit: Payer: Self-pay | Admitting: *Deleted

## 2021-07-04 IMAGING — DX BREAST SURGICAL SPECIMEN
1 series · 2 of 2 positions shown · non-contrast
Comparison: Previous exam(s).

CLINICAL DATA: Specimen radiograph status post right breast
lumpectomy.

EXAM:
SPECIMEN RADIOGRAPH OF THE RIGHT BREAST

[Series 2: specimen digital x-ray, derived · right · 2 of 2 slices shown]
[im 1/2]
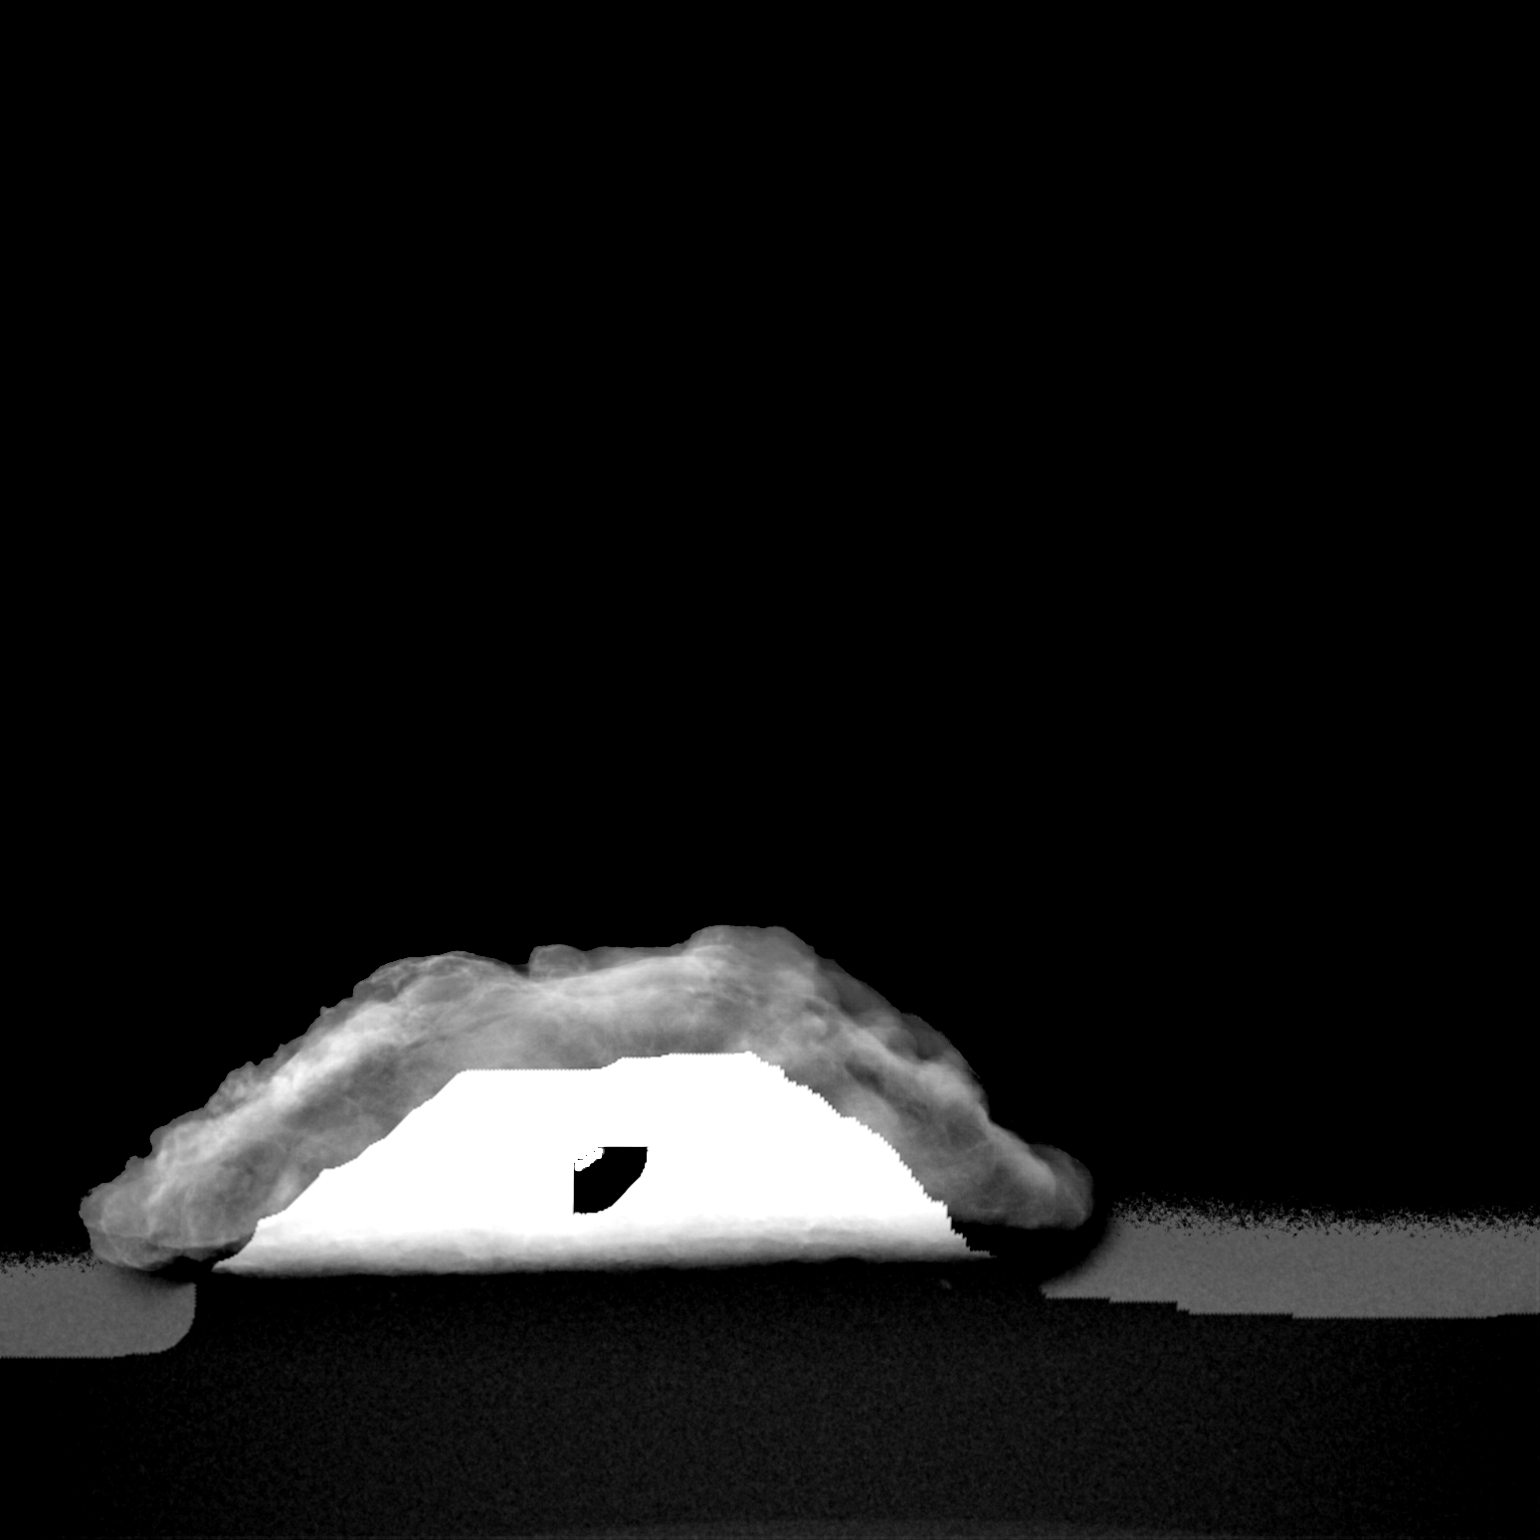
[im 2/2]
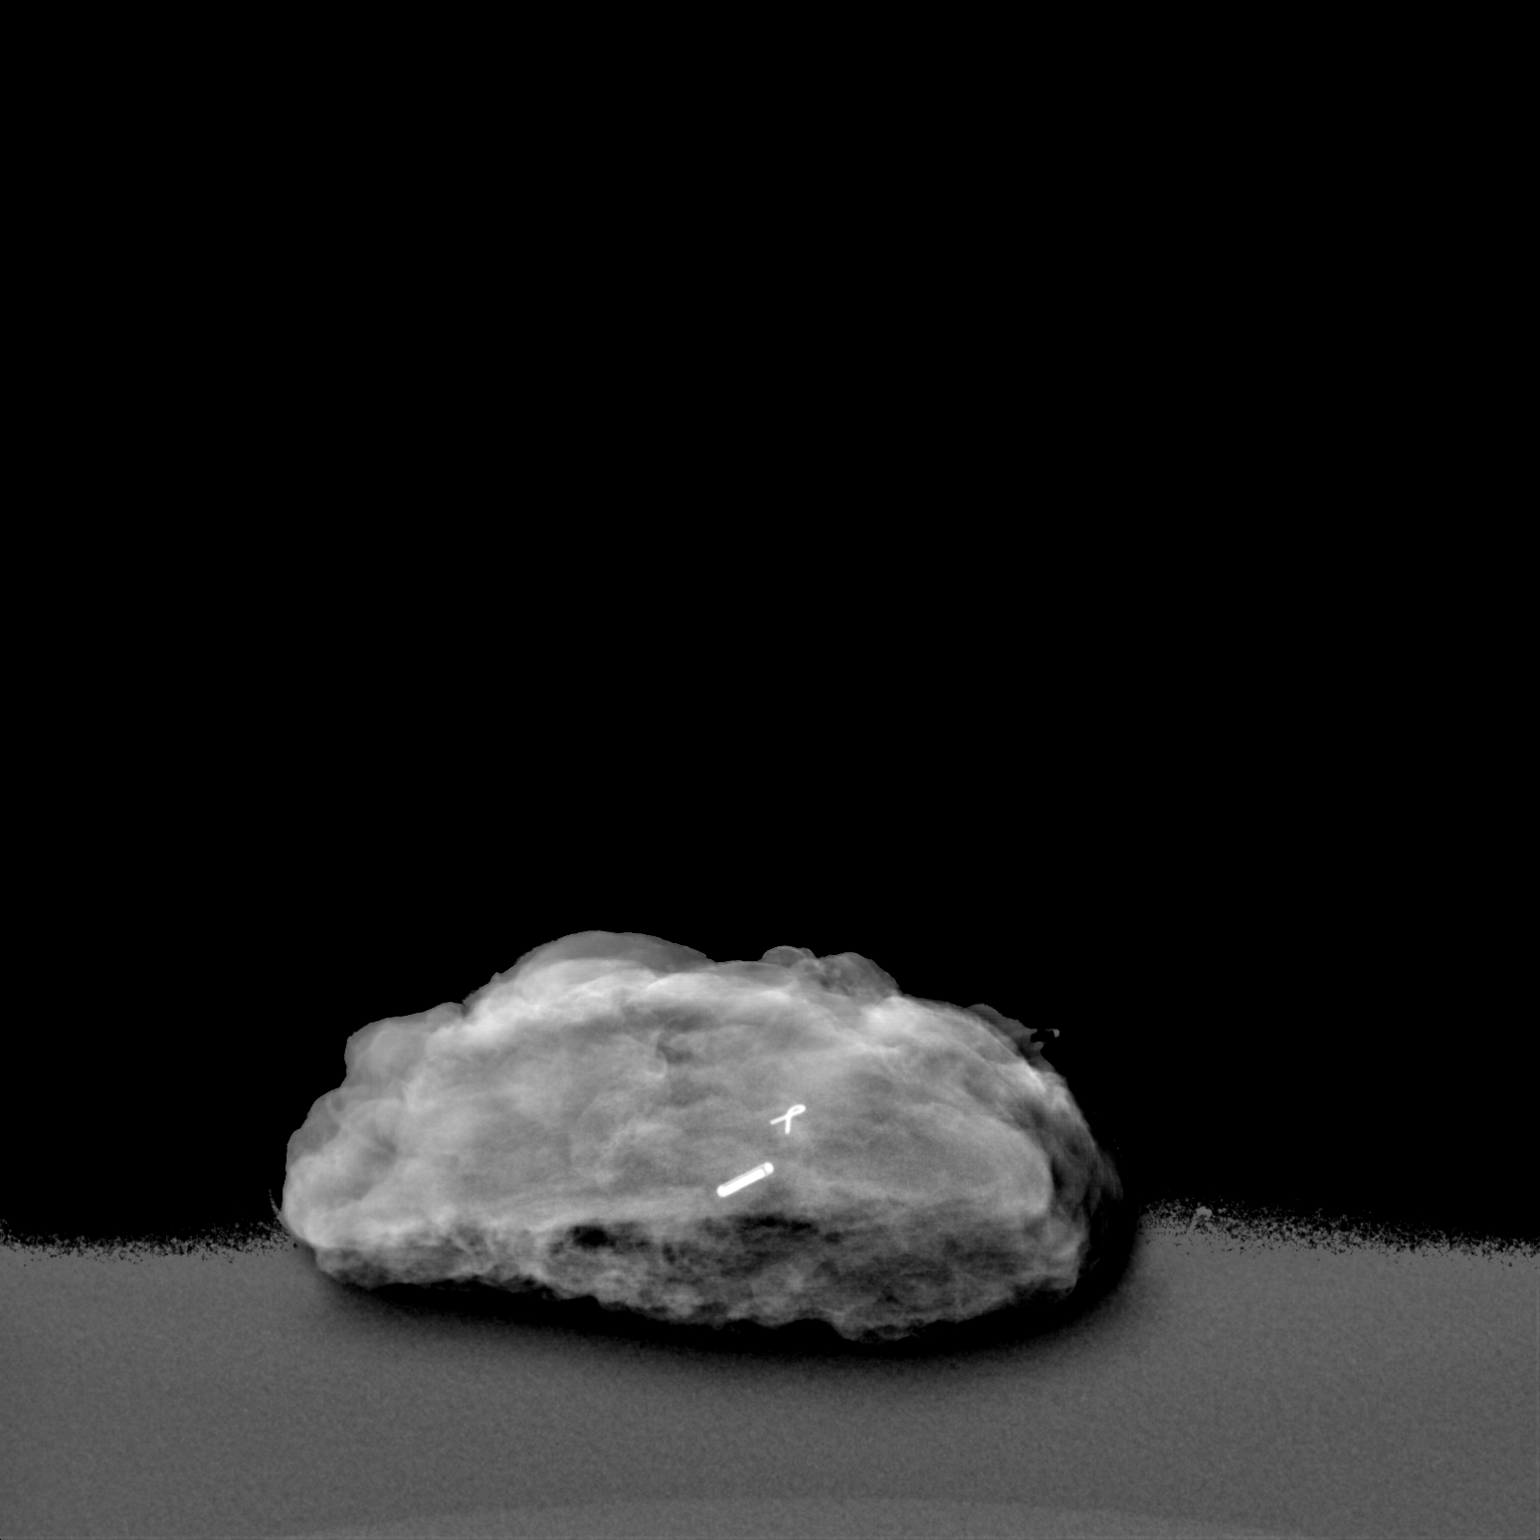

[2 of 2 positions shown; findings below may reference images not displayed]

FINDINGS: Status post excision of the right breast. The radioactive seed and
biopsy marker clip are present and completely intact. These findings
were communicated with the OR at [DATE] a.m.
IMPRESSION: Specimen radiograph of the right breast.

## 2021-07-04 MED ORDER — TAMOXIFEN CITRATE 20 MG PO TABS: 20.0000 mg | ORAL_TABLET | Freq: Every day | ORAL | 3 refills | Status: DC

## 2021-08-11 ENCOUNTER — Other Ambulatory Visit: Payer: Self-pay | Admitting: Oncology

## 2021-08-11 ENCOUNTER — Other Ambulatory Visit: Payer: Self-pay | Admitting: Hematology and Oncology

## 2021-08-11 DIAGNOSIS — C50211 Malignant neoplasm of upper-inner quadrant of right female breast: Secondary | ICD-10-CM

## 2021-08-16 ENCOUNTER — Ambulatory Visit
Admission: RE | Admit: 2021-08-16 | Discharge: 2021-08-16 | Disposition: A | Discharge status: Home / Self Care | Payer: Medicare Other | Source: Ambulatory Visit | Attending: Hematology and Oncology | Admitting: Hematology and Oncology

## 2021-08-16 DIAGNOSIS — Z853 Personal history of malignant neoplasm of breast: Secondary | ICD-10-CM | POA: Diagnosis not present

## 2021-08-16 DIAGNOSIS — Z170 Estrogen receptor positive status [ER+]: Secondary | ICD-10-CM

## 2021-08-28 NOTE — Progress Notes (Signed)
Patient Care Team: Silverio Decamp, MD as PCP - General (Sports Medicine) Magrinat, Virgie Dad, MD (Inactive) as Consulting Physician (Oncology) Bunnie Pion, MD as Consulting Physician (Ophthalmology) Gery Pray, MD as Consulting Physician (Radiation Oncology) Erroll Luna, MD as Consulting Physician (General Surgery)  DIAGNOSIS:  Encounter Diagnosis  Name Primary?   Malignant neoplasm of upper-inner quadrant of right breast in female, estrogen receptor positive ([place])     SUMMARY OF ONCOLOGIC HISTORY: Oncology History  Malignant neoplasm of upper-inner quadrant of right breast in female, estrogen receptor positive ([place])  09/29/2018 Initial Diagnosis   Malignant neoplasm of upper-inner quadrant of right breast in female, estrogen receptor positive ([place])    10/08/2018 Cancer Staging   Staging form: Breast, AJCC 8th Edition - Clinical: Stage IA (cT1b, cN0, cM0, G1, ER+, PR+, HER2-) - Signed by Chauncey Cruel, MD on 05/11/2020    10/30/2018 Cancer Staging   Staging form: Breast, AJCC 8th Edition - Pathologic stage from 10/30/2018: Stage IA (pT1b, pN0, cM0, G1, ER+, PR+, HER2-) - Signed by Gardenia Phlegm, NP on 11/05/2018    11/06/2018 Genetic Testing   Negative genetic testing on the [facility] Multi-Cancer panel.  The report date is 11/06/2018.  The Multi-Gene Panel offered by [facility] includes sequencing and/or deletion duplication testing of the following 85 genes: AIP, ALK, APC, ATM, AXIN2,BAP1,  BARD1, BLM, BMPR1A, BRCA1, BRCA2, BRIP1, CASR, CDC73, CDH1, CDK4, CDKN1B, CDKN1C, CDKN2A (p14ARF), CDKN2A (p16INK4a), CEBPA, CHEK2, CTNNA1, DICER1, DIS3L2, EGFR (c.2369C>T, p.Thr790Met variant only), EPCAM (Deletion/duplication testing only), FH, FLCN, GATA2, GPC3, GREM1 (Promoter region deletion/duplication testing only), HOXB13 (c.251G>A, p.Gly84Glu), HRAS, KIT, MAX, MEN1, MET, MITF (c.952G>A, p.Glu318Lys variant only), MLH1, MSH2, MSH3, MSH6, MUTYH, NBN,  NF1, NF2, NTHL1, PALB2, PDGFRA, PHOX2B, PMS2, POLD1, POLE, POT1, PRKAR1A, PTCH1, PTEN, RAD50, RAD51C, RAD51D, RB1, RECQL4, RET, RNF43, RUNX1, SDHAF2, SDHA (sequence changes only), SDHB, SDHC, SDHD, SMAD4, SMARCA4, SMARCB1, SMARCE1, STK11, SUFU, TERC, TERT, TMEM127, TP53, TSC1, TSC2, VHL, WRN and WT1.       CHIEF COMPLIANT: Estrogen receptor positive breast cancer on Tamoxifen  INTERVAL HISTORY: Joan Lopez is a 71 y.o. with the above mention Estrogen receptor positive breast on Tamoxifen. She denies hot flashes. Denies muscle cramps and joint stiffness. Denies pain and discomfort in breast. Overall she is tolerating the Tamoxifen.  She denies any pain or discomfort in the breast.  Denies any lumps or nodules.  She is tolerating tamoxifen extremely well.  She gets a slight achiness after she has performed heavy exertion or running.   ALLERGIES:  is allergic to augmentin [amoxicillin-pot clavulanate].  MEDICATIONS:  Current Outpatient Medications  Medication Sig Dispense Refill   calcium carbonate (OSCAL) 1500 (600 Ca) MG TABS tablet Take 1 tablet (1,500 mg total) by mouth 2 (two) times daily with a meal.     folic acid (FOLVITE) 1 MG tablet Take 1 tablet (1 mg total) by mouth daily. 90 tablet 3   loteprednol (LOTEMAX) 0.5 % ophthalmic suspension Place 1 drop into both eyes as directed. Reported on 06/07/2015     meloxicam (MOBIC) 15 MG tablet One tab PO qAM with a meal for 2 weeks, then daily prn pain. 30 tablet 3   PREMARIN vaginal cream PLACE 1 APPLICATORFUL VAGINALLY DAILY. 30 g 12   tamoxifen (NOLVADEX) 20 MG tablet Take 1 tablet (20 mg total) by mouth daily. 90 tablet 3   vitamin B-12 (CYANOCOBALAMIN) 1000 MCG tablet Take 1 tablet (1,000 mcg total) by mouth daily. 90 tablet 3  No current facility-administered medications for this visit.    PHYSICAL EXAMINATION: ECOG PERFORMANCE STATUS: 1 - Symptomatic but completely ambulatory  Vitals:   09/06/21 0831  BP: (!) 154/82  Pulse: 72   Resp: 18  Temp: (!) 97.3 F (36.3 C)  SpO2: 99%   Filed Weights   09/06/21 0831  Weight: 125 lb 3.2 oz (56.8 kg)    BREAST: No palpable masses or nodules in either right or left breasts. No palpable axillary supraclavicular or infraclavicular adenopathy no breast tenderness or nipple discharge. (exam performed in the presence of a chaperone)  LABORATORY DATA:  I have reviewed the data as listed    Latest Ref Rng & Units 09/06/2021    8:26 AM 05/26/2021   12:00 AM 03/22/2021   12:00 AM  CMP  Glucose 70 - 99 mg/dL 107   99   79    BUN 8 - 23 mg/dL 20   20   21     Creatinine 0.44 - 1.00 mg/dL 0.85   0.79   0.72    Sodium 135 - 145 mmol/L 141   143   144    Potassium 3.5 - 5.1 mmol/L 4.7   4.0   4.0    Chloride 98 - 111 mmol/L 107   106   106    CO2 22 - 32 mmol/L 29   30   28     Calcium 8.9 - 10.3 mg/dL 9.4   9.0   9.0    Total Protein 6.5 - 8.1 g/dL 6.9    6.8    Total Bilirubin 0.3 - 1.2 mg/dL 0.5    0.7    Alkaline Phos 38 - 126 U/L 39      AST 15 - 41 U/L 18    18    ALT 0 - 44 U/L 12    11      Lab Results  Component Value Date   WBC 3.8 (L) 09/06/2021   HGB 13.6 09/06/2021   HCT 39.9 09/06/2021   MCV 90.9 09/06/2021   PLT 133 (L) 09/06/2021   NEUTROABS 1.9 09/06/2021    ASSESSMENT & PLAN:  Malignant neoplasm of upper-inner quadrant of right breast in female, estrogen receptor positive ([place]) 09/26/2018: Right UOQ T1BN0 stage Ia grade 1 IDC ER/PR positive HER2 negative Ki-67 5% 10/24/2018: Right lumpectomy: T1BN0 stage Ia grade 1 IDC negative margins 0/3 lymph nodes negative 12/01/2018-12/29/2018: Adjuvant radiation October 2020: Tamoxifen started Genetics: No mutations  Tamoxifen toxicities: 1.  Vaginal dryness: Using Estrace cream twice a week Some joint stiffness but she is a runner and usually she feels stiff the day after a long run.  Breast cancer surveillance: 1.  Breast exam 09/06/2021: Benign 2. Mammogram 08/16/2021: Benign breast density category  B  Return to clinic in 1 year for follow-up    Orders Placed This Encounter  Procedures   MM DIAG BREAST TOMO BILATERAL    Standing Status:   Future    Standing Expiration Date:   09/07/2022    Order Specific Question:   Reason for Exam (SYMPTOM  OR DIAGNOSIS REQUIRED)    Answer:   Annual mammograms    Order Specific Question:   Preferred imaging location?    Answer:   [facility]    Order Specific Question:   Release to patient    Answer:   Immediate   CBC with Differential (Cancer Center Only)    Standing Status:   Future    Standing Expiration  Date:   09/07/2022   CMP ([place] only)    Standing Status:   Future    Standing Expiration Date:   09/07/2022   The patient has a good understanding of the overall plan. she agrees with it. she will call with any problems that may develop before the next visit here. Total time spent: 30 mins including face to face time and time spent for planning, charting and co-ordination of care   Harriette Ohara, MD 09/06/21    I Gardiner Coins am scribing for Dr. Lindi Adie  I have reviewed the above documentation for accuracy and completeness, and I agree with the above.

## 2021-09-05 ENCOUNTER — Other Ambulatory Visit: Payer: Self-pay | Admitting: *Deleted

## 2021-09-05 DIAGNOSIS — Z170 Estrogen receptor positive status [ER+]: Secondary | ICD-10-CM

## 2021-09-06 ENCOUNTER — Inpatient Hospital Stay: Payer: Medicare Other | Attending: Hematology and Oncology | Admitting: Hematology and Oncology

## 2021-09-06 ENCOUNTER — Other Ambulatory Visit: Payer: Self-pay

## 2021-09-06 ENCOUNTER — Inpatient Hospital Stay: Payer: Medicare Other

## 2021-09-06 DIAGNOSIS — C50211 Malignant neoplasm of upper-inner quadrant of right female breast: Secondary | ICD-10-CM | POA: Insufficient documentation

## 2021-09-06 DIAGNOSIS — Z170 Estrogen receptor positive status [ER+]: Secondary | ICD-10-CM | POA: Diagnosis not present

## 2021-09-06 DIAGNOSIS — Z79810 Long term (current) use of selective estrogen receptor modulators (SERMs): Secondary | ICD-10-CM | POA: Insufficient documentation

## 2021-09-06 LAB — CMP (CANCER CENTER ONLY)
ALT: 12 U/L (ref 0–44)
AST: 18 U/L (ref 15–41)
Albumin: 4.1 g/dL (ref 3.5–5.0)
Alkaline Phosphatase: 39 U/L (ref 38–126)
Anion gap: 5 (ref 5–15)
BUN: 20 mg/dL (ref 8–23)
CO2: 29 mmol/L (ref 22–32)
Calcium: 9.4 mg/dL (ref 8.9–10.3)
Chloride: 107 mmol/L (ref 98–111)
Creatinine: 0.85 mg/dL (ref 0.44–1.00)
GFR, Estimated: >60 mL/min (ref >60)
Glucose, Bld: 107 mg/dL — ABNORMAL HIGH (ref 70–99)
Potassium: 4.7 mmol/L (ref 3.5–5.1)
Sodium: 141 mmol/L (ref 135–145)
Total Bilirubin: 0.5 mg/dL (ref 0.3–1.2)
Total Protein: 6.9 g/dL (ref 6.5–8.1)

## 2021-09-06 LAB — CBC WITH DIFFERENTIAL (CANCER CENTER ONLY)
Abs Immature Granulocytes: 0.01 10*3/uL (ref 0.00–0.07)
Basophils Absolute: 0 10*3/uL (ref 0.0–0.1)
Basophils Relative: 1 %
Eosinophils Absolute: 0.1 10*3/uL (ref 0.0–0.5)
Eosinophils Relative: 2 %
HCT: 39.9 % (ref 36.0–46.0)
Hemoglobin: 13.6 g/dL (ref 12.0–15.0)
Immature Granulocytes: 0 %
Lymphocytes Relative: 34 %
Lymphs Abs: 1.3 10*3/uL (ref 0.7–4.0)
MCH: 31 pg (ref 26.0–34.0)
MCHC: 34.1 g/dL (ref 30.0–36.0)
MCV: 90.9 fL (ref 80.0–100.0)
Monocytes Absolute: 0.5 10*3/uL (ref 0.1–1.0)
Monocytes Relative: 14 %
Neutro Abs: 1.9 10*3/uL (ref 1.7–7.7)
Neutrophils Relative %: 49 %
Platelet Count: 133 10*3/uL — ABNORMAL LOW (ref 150–400)
RBC: 4.39 MIL/uL (ref 3.87–5.11)
RDW: 12.5 % (ref 11.5–15.5)
WBC Count: 3.8 10*3/uL — ABNORMAL LOW (ref 4.0–10.5)
nRBC: 0 % (ref 0.0–0.2)

## 2021-09-06 NOTE — Assessment & Plan Note (Addendum)
09/26/2018: Right UOQ T1BN0 stage Ia grade 1 IDC ER/PR positive HER2 negative Ki-67 5% 10/24/2018: Right lumpectomy: T1BN0 stage Ia grade 1 IDC negative margins 0/3 lymph nodes negative 12/01/2018-12/29/2018: Adjuvant radiation October 2020: Tamoxifen started Genetics: No mutations  Tamoxifen toxicities: 1.  Vaginal dryness: Using Estrace cream twice a week Some joint stiffness but she is a runner and usually she feels stiff the day after a long run.  Breast cancer surveillance: 1.  Breast exam 09/06/2021: Benign 2. Mammogram 08/16/2021: Benign breast density category B  Return to clinic in 1 year for follow-up

## 2021-10-27 DIAGNOSIS — H18513 Endothelial corneal dystrophy, bilateral: Secondary | ICD-10-CM | POA: Diagnosis not present

## 2021-10-27 DIAGNOSIS — H539 Unspecified visual disturbance: Secondary | ICD-10-CM | POA: Diagnosis not present

## 2021-10-27 DIAGNOSIS — Z947 Corneal transplant status: Secondary | ICD-10-CM | POA: Diagnosis not present

## 2021-11-13 ENCOUNTER — Telehealth: Payer: Self-pay

## 2021-11-13 NOTE — Telephone Encounter (Signed)
Pt called to let us know we would be receiving a fax from [facility] regarding financial assistance for Tamoxifen. Advised pt as of yet we have not received a fax. Provided pt with direct fax to our office [phone]. Pt states she will call them to give this info. Advised pt we would watch for fax and complete as requested.

## 2021-11-15 ENCOUNTER — Encounter: Payer: Self-pay | Admitting: *Deleted

## 2021-11-15 NOTE — Progress Notes (Signed)
RN received fax from [facility] requesting information regarding pt need for Tamoxifen.  Paper work completed and signed by MD. RN successfully faxed it to the number provided [phone].

## 2021-12-06 ENCOUNTER — Encounter: Payer: Self-pay | Admitting: General Practice

## 2022-01-26 ENCOUNTER — Other Ambulatory Visit: Payer: Self-pay | Admitting: Sports Medicine

## 2022-01-26 DIAGNOSIS — E538 Deficiency of other specified B group vitamins: Secondary | ICD-10-CM

## 2022-01-31 ENCOUNTER — Other Ambulatory Visit: Payer: Self-pay

## 2022-03-21 ENCOUNTER — Other Ambulatory Visit: Payer: Self-pay | Admitting: Sports Medicine

## 2022-05-04 DIAGNOSIS — H18513 Endothelial corneal dystrophy, bilateral: Secondary | ICD-10-CM | POA: Diagnosis not present

## 2022-05-04 DIAGNOSIS — Z947 Corneal transplant status: Secondary | ICD-10-CM | POA: Diagnosis not present

## 2022-05-04 DIAGNOSIS — H5212 Myopia, left eye: Secondary | ICD-10-CM | POA: Diagnosis not present

## 2022-05-20 ENCOUNTER — Other Ambulatory Visit: Payer: Self-pay | Admitting: Sports Medicine

## 2022-05-20 DIAGNOSIS — N898 Other specified noninflammatory disorders of vagina: Secondary | ICD-10-CM

## 2022-06-18 ENCOUNTER — Telehealth: Payer: Self-pay | Admitting: Sports Medicine

## 2022-06-18 NOTE — Telephone Encounter (Signed)
[place] to schedule their annual wellness visit. Appointment made for 07/16/22 at 2 PM.  Lin Givens Patient Access Advocate II Direct Dial: [phone]

## 2022-06-25 ENCOUNTER — Other Ambulatory Visit: Payer: Self-pay | Admitting: Hematology and Oncology

## 2022-07-16 ENCOUNTER — Ambulatory Visit: Payer: Medicare Other

## 2022-07-30 ENCOUNTER — Ambulatory Visit (INDEPENDENT_AMBULATORY_CARE_PROVIDER_SITE_OTHER): Payer: Medicare Other

## 2022-07-30 ENCOUNTER — Ambulatory Visit (INDEPENDENT_AMBULATORY_CARE_PROVIDER_SITE_OTHER): Payer: Medicare Other | Admitting: Sports Medicine

## 2022-07-30 DIAGNOSIS — M79651 Pain in right thigh: Secondary | ICD-10-CM

## 2022-07-30 DIAGNOSIS — M25551 Pain in right hip: Secondary | ICD-10-CM | POA: Diagnosis not present

## 2022-07-30 NOTE — Progress Notes (Signed)
    Procedures performed today:    None.  Independent interpretation of notes and tests performed by another provider:   None.  Brief History, Exam, Impression, and Recommendations:    Right thigh pain Pleasant 72 year old female, she has had about a month of right buttock and posterior thigh pain after doing a run that involves more uphill than typical for her. On exam it is difficult to reproduce her pain but resisted knee extension with the hip flexed at 90 degrees does seem to reproduce it, she does not have any discrete tenderness over the trochanteric bursa, ischial bursa, no reproduction of pain with internal rotation of the hip or resisted abduction. Suspect upper hamstring strain, she will do a thigh sleeve, hamstring eccentric send return to see me in 6 weeks, MRI if not better.    ____________________________________________ Ihor Austin. Benjamin Stain, M.D., ABFM., CAQSM., AME. Primary Care and Sports Medicine [facility]  Adjunct Professor of Family Medicine  [place] [employer] [facility] [employer]  Restaurant manager, fast food

## 2022-07-30 NOTE — Patient Instructions (Signed)
Initial Hamstring Rehab Protocol Hamstring curls: Start with 3 sets of 15 (no weight); Progress by 5 reps every 3 days until you reach 3 sets of 30; After 3 days at 3 sets of 30, add 2lb ankle weight at 3 sets of 10; Increase every 5 days by 5 reps. You may add 2lbs ankle weight once weekly. Hamstring swings- swing leg backwards and curl at the end of the swing. Follow same schedule as above. Hamstring running lunges- running lunge position means no more than 45 degrees of knee flexion and running motion. Follow same schedule as above. 

## 2022-07-30 NOTE — Assessment & Plan Note (Signed)
Pleasant 72 year old female, she has had about a month of right buttock and posterior thigh pain after doing a run that involves more uphill than typical for her. On exam it is difficult to reproduce her pain but resisted knee extension with the hip flexed at 90 degrees does seem to reproduce it, she does not have any discrete tenderness over the trochanteric bursa, ischial bursa, no reproduction of pain with internal rotation of the hip or resisted abduction. Suspect upper hamstring strain, she will do a thigh sleeve, hamstring eccentric send return to see me in 6 weeks, MRI if not better.

## 2022-08-21 ENCOUNTER — Ambulatory Visit
Admission: RE | Admit: 2022-08-21 | Discharge: 2022-08-21 | Disposition: A | Discharge status: Home / Self Care | Payer: Medicare Other | Source: Ambulatory Visit | Attending: Hematology and Oncology | Admitting: Hematology and Oncology

## 2022-08-21 DIAGNOSIS — Z853 Personal history of malignant neoplasm of breast: Secondary | ICD-10-CM | POA: Diagnosis not present

## 2022-08-21 DIAGNOSIS — C50211 Malignant neoplasm of upper-inner quadrant of right female breast: Secondary | ICD-10-CM

## 2022-08-21 DIAGNOSIS — Z1231 Encounter for screening mammogram for malignant neoplasm of breast: Secondary | ICD-10-CM | POA: Diagnosis not present

## 2022-08-27 ENCOUNTER — Ambulatory Visit (INDEPENDENT_AMBULATORY_CARE_PROVIDER_SITE_OTHER): Payer: Medicare Other | Admitting: Sports Medicine

## 2022-08-27 DIAGNOSIS — M79651 Pain in right thigh: Secondary | ICD-10-CM

## 2022-08-27 MED ORDER — TRIAZOLAM 0.25 MG PO TABS: ORAL_TABLET | ORAL | 0 refills | Status: DC

## 2022-08-27 MED ORDER — GABAPENTIN 300 MG PO CAPS: ORAL_CAPSULE | ORAL | 3 refills | Status: DC

## 2022-08-27 NOTE — Assessment & Plan Note (Signed)
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 She typically localizes her pain deep within the right buttock at the ischial tuberosity. Reproduced with resisted flexion of the knee. She has now failed greater than 6 weeks of home physical therapy, analgesics, proceeding with MRI of the right hip, if we do see signs of a hamstring tendinitis or ischial bursitis we will do an injection, if not we can proceed with further evaluation of her lumbar spine as a source of the pain. Adding some Neurontin in the meantime to hold her over. If we are going to do an ischial bursa injection she would like triazolam prior.

## 2022-08-27 NOTE — Progress Notes (Signed)
    Procedures performed today:    None.  Independent interpretation of notes and tests performed by another provider:   None.  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 She typically localizes her pain deep within the right buttock at the ischial tuberosity. Reproduced with resisted flexion of the knee. She has now failed greater than 6 weeks of home physical therapy, analgesics, proceeding with MRI of the right hip, if we do see signs of a hamstring tendinitis or ischial bursitis we will do an injection, if not we can proceed with further evaluation of her lumbar spine as a source of the pain. Adding some Neurontin in the meantime to hold her over. If we are going to do an ischial bursa injection she would like triazolam prior.    ____________________________________________ Ihor Austin. Benjamin Stain, M.D., ABFM., CAQSM., AME. Primary Care and Sports Medicine [facility]  Adjunct Professor of Family Medicine  [place] [employer] [facility] [employer]  Restaurant manager, fast food

## 2022-09-01 ENCOUNTER — Ambulatory Visit (INDEPENDENT_AMBULATORY_CARE_PROVIDER_SITE_OTHER): Payer: Medicare Other

## 2022-09-01 DIAGNOSIS — M25551 Pain in right hip: Secondary | ICD-10-CM | POA: Diagnosis not present

## 2022-09-01 DIAGNOSIS — M79651 Pain in right thigh: Secondary | ICD-10-CM

## 2022-09-06 NOTE — Progress Notes (Signed)
Patient Care Team: Monica Becton, MD as PCP - General (Sports Medicine) Magrinat, Valentino Hue, MD (Inactive) as Consulting Physician (Oncology) Princess Bruins, MD as Consulting Physician (Ophthalmology) Antony Blackbird, MD as Consulting Physician (Radiation Oncology) Harriette Bouillon, MD as Consulting Physician (General Surgery)  DIAGNOSIS: No diagnosis found.  SUMMARY OF ONCOLOGIC HISTORY: Oncology History  Malignant neoplasm of upper-inner quadrant of right breast in female, estrogen receptor positive (HCC)  09/29/2018 Initial Diagnosis   Malignant neoplasm of upper-inner quadrant of right breast in female, estrogen receptor positive (HCC)   10/08/2018 Cancer Staging   Staging form: Breast, AJCC 8th Edition - Clinical: Stage IA (cT1b, cN0, cM0, G1, ER+, PR+, HER2-) - Signed by Lowella Dell, MD on 05/11/2020   10/30/2018 Cancer Staging   Staging form: Breast, AJCC 8th Edition - Pathologic stage from 10/30/2018: Stage IA (pT1b, pN0, cM0, G1, ER+, PR+, HER2-) - Signed by Loa Socks, NP on 11/05/2018   11/06/2018 Genetic Testing   Negative genetic testing on the [facility] Multi-Cancer panel.  The report date is 11/06/2018.  The Multi-Gene Panel offered by [facility] includes sequencing and/or deletion duplication testing of the following 85 genes: AIP, ALK, APC, ATM, AXIN2,BAP1,  BARD1, BLM, BMPR1A, BRCA1, BRCA2, BRIP1, CASR, CDC73, CDH1, CDK4, CDKN1B, CDKN1C, CDKN2A (p14ARF), CDKN2A (p16INK4a), CEBPA, CHEK2, CTNNA1, DICER1, DIS3L2, EGFR (c.2369C>T, p.Thr790Met variant only), EPCAM (Deletion/duplication testing only), FH, FLCN, GATA2, GPC3, GREM1 (Promoter region deletion/duplication testing only), HOXB13 (c.251G>A, p.Gly84Glu), HRAS, KIT, MAX, MEN1, MET, MITF (c.952G>A, p.Glu318Lys variant only), MLH1, MSH2, MSH3, MSH6, MUTYH, NBN, NF1, NF2, NTHL1, PALB2, PDGFRA, PHOX2B, PMS2, POLD1, POLE, POT1, PRKAR1A, PTCH1, PTEN, RAD50, RAD51C, RAD51D, RB1, RECQL4, RET, RNF43,  RUNX1, SDHAF2, SDHA (sequence changes only), SDHB, SDHC, SDHD, SMAD4, SMARCA4, SMARCB1, SMARCE1, STK11, SUFU, TERC, TERT, TMEM127, TP53, TSC1, TSC2, VHL, WRN and WT1.       CHIEF COMPLIANT: Estrogen receptor positive breast cancer on Tamoxifen   INTERVAL HISTORY: Joan Lopez is a  72 y.o. with the above mention Estrogen receptor positive breast on Tamoxifen. She presents to the clinic for a follow-up.    ALLERGIES:  is allergic to augmentin [amoxicillin-pot clavulanate].  MEDICATIONS:  Current Outpatient Medications  Medication Sig Dispense Refill   calcium carbonate (OSCAL) 1500 (600 Ca) MG TABS tablet Take 1 tablet (1,500 mg total) by mouth 2 (two) times daily with a meal.     folic acid (FOLVITE) 1 MG tablet Take 1 tablet (1 mg total) by mouth daily. 90 tablet 3   gabapentin (NEURONTIN) 300 MG capsule One tab PO qHS for a week, then BID for a week, then TID. May double weekly to a max of 3,600mg /day 90 capsule 3   loteprednol (LOTEMAX) 0.5 % ophthalmic suspension Place 1 drop into both eyes as directed. Reported on 06/07/2015     meloxicam (MOBIC) 15 MG tablet TAKE 1 TABLET BY MOUTH EVERY MORNING WITH FOOD FOR 2 WEEKS THEN DAILY AS NEEDED FOR PAIN 30 tablet 3   PREMARIN vaginal cream PLACE 1 APPLICATORFUL VAGINALLY DAILY. 30 g 12   tamoxifen (NOLVADEX) 20 MG tablet TAKE 1 TABLET BY MOUTH EVERY DAY 90 tablet 3   triazolam (HALCION) 0.25 MG tablet 1-2 tabs PO 2 hours before procedure or imaging.  Do not drive with this medication. 2 tablet 0   vitamin B-12 (CYANOCOBALAMIN) 1000 MCG tablet Take 1 tablet (1,000 mcg total) by mouth daily. 90 tablet 3   No current facility-administered medications for this visit.    PHYSICAL EXAMINATION: ECOG  PERFORMANCE STATUS: {CHL ONC ECOG [phone]}  There were no vitals filed for this visit. There were no vitals filed for this visit.  BREAST:*** No palpable masses or nodules in either right or left breasts. No palpable axillary  supraclavicular or infraclavicular adenopathy no breast tenderness or nipple discharge. (exam performed in the presence of a chaperone)  LABORATORY DATA:  I have reviewed the data as listed    Latest Ref Rng & Units 09/06/2021    8:26 AM 05/26/2021   12:00 AM 03/22/2021   12:00 AM  CMP  Glucose 70 - 99 mg/dL 161  99  79   BUN 8 - 23 mg/dL 20  20  21    Creatinine 0.44 - 1.00 mg/dL 0.96  0.45  4.09   Sodium 135 - 145 mmol/L 141  143  144   Potassium 3.5 - 5.1 mmol/L 4.7  4.0  4.0   Chloride 98 - 111 mmol/L 107  106  106   CO2 22 - 32 mmol/L 29  30  28    Calcium 8.9 - 10.3 mg/dL 9.4  9.0  9.0   Total Protein 6.5 - 8.1 g/dL 6.9   6.8   Total Bilirubin 0.3 - 1.2 mg/dL 0.5   0.7   Alkaline Phos 38 - 126 U/L 39     AST 15 - 41 U/L 18   18   ALT 0 - 44 U/L 12   11     Lab Results  Component Value Date   WBC 3.8 (L) 09/06/2021   HGB 13.6 09/06/2021   HCT 39.9 09/06/2021   MCV 90.9 09/06/2021   PLT 133 (L) 09/06/2021   NEUTROABS 1.9 09/06/2021    ASSESSMENT & PLAN:  No problem-specific Assessment & Plan notes found for this encounter.    No orders of the defined types were placed in this encounter.  The patient has a good understanding of the overall plan. she agrees with it. she will call with any problems that may develop before the next visit here. Total time spent: 30 mins including face to face time and time spent for planning, charting and co-ordination of care   Sherlyn Lick, CMA 09/06/22    I Janan Ridge am acting as a Neurosurgeon for [employer]  ***

## 2022-09-11 ENCOUNTER — Other Ambulatory Visit: Payer: Self-pay

## 2022-09-11 ENCOUNTER — Inpatient Hospital Stay: Payer: Medicare Other | Attending: Hematology and Oncology

## 2022-09-11 ENCOUNTER — Inpatient Hospital Stay (HOSPITAL_BASED_OUTPATIENT_CLINIC_OR_DEPARTMENT_OTHER): Payer: Medicare Other | Admitting: Hematology and Oncology

## 2022-09-11 VITALS — BP 162/74 | HR 75 | Temp 97.7°F | Resp 17 | Ht 65.0 in | Wt 113.9 lb

## 2022-09-11 DIAGNOSIS — Z170 Estrogen receptor positive status [ER+]: Secondary | ICD-10-CM | POA: Insufficient documentation

## 2022-09-11 DIAGNOSIS — C50211 Malignant neoplasm of upper-inner quadrant of right female breast: Secondary | ICD-10-CM | POA: Diagnosis not present

## 2022-09-11 DIAGNOSIS — Z79810 Long term (current) use of selective estrogen receptor modulators (SERMs): Secondary | ICD-10-CM | POA: Diagnosis not present

## 2022-09-11 LAB — CMP (CANCER CENTER ONLY)
ALT: 13 U/L (ref 0–44)
AST: 21 U/L (ref 15–41)
Albumin: 4.1 g/dL (ref 3.5–5.0)
Alkaline Phosphatase: 30 U/L — ABNORMAL LOW (ref 38–126)
Anion gap: 6 (ref 5–15)
BUN: 24 mg/dL — ABNORMAL HIGH (ref 8–23)
CO2: 29 mmol/L (ref 22–32)
Calcium: 8.8 mg/dL — ABNORMAL LOW (ref 8.9–10.3)
Chloride: 109 mmol/L (ref 98–111)
Creatinine: 0.77 mg/dL (ref 0.44–1.00)
GFR, Estimated: >60 mL/min (ref >60)
Glucose, Bld: 101 mg/dL — ABNORMAL HIGH (ref 70–99)
Potassium: 4.1 mmol/L (ref 3.5–5.1)
Sodium: 144 mmol/L (ref 135–145)
Total Bilirubin: 0.6 mg/dL (ref 0.3–1.2)
Total Protein: 6.5 g/dL (ref 6.5–8.1)

## 2022-09-11 LAB — CBC WITH DIFFERENTIAL (CANCER CENTER ONLY)
Abs Immature Granulocytes: 0.01 10*3/uL (ref 0.00–0.07)
Basophils Absolute: 0 10*3/uL (ref 0.0–0.1)
Basophils Relative: 1 %
Eosinophils Absolute: 0.1 10*3/uL (ref 0.0–0.5)
Eosinophils Relative: 2 %
HCT: 40.3 % (ref 36.0–46.0)
Hemoglobin: 13.3 g/dL (ref 12.0–15.0)
Immature Granulocytes: 0 %
Lymphocytes Relative: 30 %
Lymphs Abs: 1.1 10*3/uL (ref 0.7–4.0)
MCH: 30.9 pg (ref 26.0–34.0)
MCHC: 33 g/dL (ref 30.0–36.0)
MCV: 93.7 fL (ref 80.0–100.0)
Monocytes Absolute: 0.5 10*3/uL (ref 0.1–1.0)
Monocytes Relative: 13 %
Neutro Abs: 2 10*3/uL (ref 1.7–7.7)
Neutrophils Relative %: 54 %
Platelet Count: 116 10*3/uL — ABNORMAL LOW (ref 150–400)
RBC: 4.3 MIL/uL (ref 3.87–5.11)
RDW: 12.5 % (ref 11.5–15.5)
WBC Count: 3.7 10*3/uL — ABNORMAL LOW (ref 4.0–10.5)
nRBC: 0 % (ref 0.0–0.2)

## 2022-09-11 NOTE — Assessment & Plan Note (Addendum)
09/26/2018: Right UOQ T1BN0 stage Ia grade 1 IDC ER/PR positive HER2 negative Ki-67 5% 10/24/2018: Right lumpectomy: T1BN0 stage Ia grade 1 IDC negative margins 0/3 lymph nodes negative 12/01/2018-12/29/2018: Adjuvant radiation October 2020: Tamoxifen started Genetics: No mutations   Tamoxifen toxicities: 1.  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  She is straining for a marathon.   Breast cancer surveillance: 1.  Breast exam 09/11/2022: Benign 2. Mammogram 08/21/2022: Benign breast density category B  We will send for breast cancer index to determine if she would benefit from extended endocrine therapy.   Thrombocytopenia: I discussed with her that the platelet count is lower than last year but not low enough to do any interventions.  We will continue to watch and monitor this.  Return to clinic in 1 year for follow-up

## 2022-09-12 ENCOUNTER — Encounter: Payer: Self-pay | Admitting: *Deleted

## 2022-09-12 ENCOUNTER — Telehealth: Payer: Self-pay | Admitting: Sports Medicine

## 2022-09-12 NOTE — Telephone Encounter (Signed)
Pt called.  Pt wants to know if  she  is going to get the shot at her next appointment? Re MRI results?

## 2022-09-12 NOTE — Progress Notes (Signed)
Per MD request RN successfully faxed [facility] request to [phone].

## 2022-09-13 NOTE — Telephone Encounter (Signed)
Only if the residual pain is not something she can live with and she has been honestly consistent with her home physical therapy.

## 2022-09-17 ENCOUNTER — Ambulatory Visit: Payer: Medicare Other | Admitting: Sports Medicine

## 2022-09-21 DIAGNOSIS — C50212 Malignant neoplasm of upper-inner quadrant of left female breast: Secondary | ICD-10-CM | POA: Diagnosis not present

## 2022-09-28 ENCOUNTER — Encounter: Payer: Self-pay | Admitting: Hematology and Oncology

## 2022-10-10 ENCOUNTER — Ambulatory Visit (INDEPENDENT_AMBULATORY_CARE_PROVIDER_SITE_OTHER): Payer: Medicare Other | Admitting: Sports Medicine

## 2022-10-10 ENCOUNTER — Other Ambulatory Visit (INDEPENDENT_AMBULATORY_CARE_PROVIDER_SITE_OTHER): Payer: Medicare Other

## 2022-10-10 DIAGNOSIS — M79651 Pain in right thigh: Secondary | ICD-10-CM

## 2022-10-10 DIAGNOSIS — L821 Other seborrheic keratosis: Secondary | ICD-10-CM | POA: Diagnosis not present

## 2022-10-10 NOTE — Progress Notes (Signed)
    Procedures performed today:    Procedure: Real-time Ultrasound Guided injection of the right ischial bursa Device: Samsung HS60  Verbal informed consent obtained.  Time-out conducted.  Noted no overlying erythema, induration, or other signs of local infection.  Skin prepped in a sterile fashion.  Local anesthesia: Topical Ethyl chloride.  With sterile technique and under real time ultrasound guidance: Noted normal-appearing bursa and proximal hamstring, 1 cc Kenalog 40, 1 cc lidocaine, 1 cc bupivacaine injected easily Completed without difficulty  Advised to call if fevers/chills, erythema, induration, drainage, or persistent bleeding.  Images permanently stored and available for review in PACS.  Impression: Technically successful ultrasound guided injection.  Procedure:  Cryodestruction of right-sided forehead SK Consent obtained and verified. Time-out conducted. Noted no overlying erythema, induration, or other signs of local infection. Completed without difficulty using Cryo-Gun. Advised to call if fevers/chills, erythema, induration, drainage, or persistent bleeding.  Independent interpretation of notes and tests performed by another provider:   None.  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 We placed her through aggressive home physical therapy for hamstring conditioning, we obtained an MRI that ultimately showed proximal hamstring tendinosis, due to failure of conservative treatment we did proceed with a right ischial bursa injection today with ultrasound guidance. If insufficient improvement with this we will further investigate her lumbar spine.  Seborrheic  keratosis Right-sided forehead seborrheic keratosis, cryotherapy performed as above, return as needed for this.    ____________________________________________ Joan Lopez. Benjamin Stain, M.D., ABFM., CAQSM., AME. Primary Care and Sports Medicine [facility]  Adjunct Professor of Family Medicine  [place] [employer] [facility] [employer] [employer]  Restaurant manager, fast food

## 2022-10-10 NOTE — Assessment & Plan Note (Signed)
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 We placed her through aggressive home physical therapy for hamstring conditioning, we obtained an MRI that ultimately showed proximal hamstring tendinosis, due to failure of conservative treatment we did proceed with a right ischial bursa injection today with ultrasound guidance. If insufficient improvement with this we will further investigate her lumbar spine.

## 2022-10-10 NOTE — Assessment & Plan Note (Addendum)
Right-sided forehead seborrheic keratosis, cryotherapy performed as above, return as needed for this.

## 2022-10-15 ENCOUNTER — Ambulatory Visit (INDEPENDENT_AMBULATORY_CARE_PROVIDER_SITE_OTHER): Payer: Medicare Other

## 2022-10-15 ENCOUNTER — Ambulatory Visit (INDEPENDENT_AMBULATORY_CARE_PROVIDER_SITE_OTHER): Payer: Medicare Other | Admitting: Sports Medicine

## 2022-10-15 DIAGNOSIS — M79651 Pain in right thigh: Secondary | ICD-10-CM

## 2022-10-15 DIAGNOSIS — M7918 Myalgia, other site: Secondary | ICD-10-CM

## 2022-10-15 DIAGNOSIS — M899 Disorder of bone, unspecified: Secondary | ICD-10-CM | POA: Insufficient documentation

## 2022-10-15 DIAGNOSIS — C799 Secondary malignant neoplasm of unspecified site: Secondary | ICD-10-CM | POA: Diagnosis not present

## 2022-10-15 DIAGNOSIS — M5126 Other intervertebral disc displacement, lumbar region: Secondary | ICD-10-CM | POA: Diagnosis not present

## 2022-10-15 DIAGNOSIS — C50919 Malignant neoplasm of unspecified site of unspecified female breast: Secondary | ICD-10-CM | POA: Diagnosis not present

## 2022-10-15 DIAGNOSIS — M5136 Other intervertebral disc degeneration, lumbar region: Secondary | ICD-10-CM | POA: Diagnosis not present

## 2022-10-15 NOTE — Progress Notes (Addendum)
    Procedures performed today:    None.  Independent interpretation of notes and tests performed by another provider:   None.  Brief History, Exam, Impression, and Recommendations:    Right buttock pain Pleasant 72 year old female, she is a marathon runner, she has had about 5 months of right buttock and posterior thigh pain localized deep within the right buttock, near the ischial tuberosity historically reproduced with resisted flexion of the knee in sitting. She had aggressive home PT with hamstring conditioning that did not work, we obtained an MRI that did show proximal hamstring tendinosis, at the last visit approximately 5 days ago we did a right ischial bursa injection without any improvement. She does have known L5-S1 spondylolisthesis with facet arthritis, due to failure of addressing the ischial pain we will proceed with further investigation of her lumbar spine. Adding an MRI, we should be able to get this approved easily due to her history of breast cancer. Once I see the results I will go ahead and order her epidural, likely an L5-S1 interlaminar epidural, she has requested Dr. Barnett Abu.  Update: MRI performed today, there is multifactorial lumbar spinal stenosis worse at L4-L5, I am going to go and order the right L4-L5 interlaminar epidural with Dr. Danielle Dess.  Of note there is mention of some T2 hyperintensities throughout the spine, considering history of breast cancer we do need to proceed with an MRI of the entire spine with and without contrast.    Lesion of lumbar spine Incidentally noted T2 hyperintensities, in a patient with a history of breast cancer it is imperative that we follow this up, we will do an MRI of the entire spine with and without contrast.     ____________________________________________ Ihor Austin. Benjamin Stain, M.D., ABFM., CAQSM., AME. Primary Care and Sports Medicine [facility]  Adjunct Professor of Family Medicine   [place] [employer] [facility] [employer]  Restaurant manager, fast food

## 2022-10-15 NOTE — Assessment & Plan Note (Signed)
Incidentally noted T2 hyperintensities, in a patient with a history of breast cancer it is imperative that we follow this up, we will do an MRI of the entire spine with and without contrast.

## 2022-10-15 NOTE — Addendum Note (Signed)
Addended by: Monica Becton on: 10/15/2022 02:33 PM   Modules accepted: Orders

## 2022-10-15 NOTE — Assessment & Plan Note (Addendum)
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 She had aggressive home PT with hamstring conditioning that did not work, we obtained an MRI that did show proximal hamstring tendinosis, at the last visit approximately 5 days ago we did a right ischial bursa injection without any improvement. She does have known L5-S1 spondylolisthesis with facet arthritis, due to failure of addressing the ischial pain we will proceed with further investigation of her lumbar spine. Adding an MRI, we should be able to get this approved easily due to her history of breast cancer. Once I see the results I will go ahead and order her epidural, likely an L5-S1 interlaminar epidural, she has requested Dr. Barnett Abu.  Update: MRI performed today, there is multifactorial lumbar spinal stenosis worse at L4-L5, I am going to go and order the right L4-L5 interlaminar epidural with Dr. Danielle Dess.  Of note there is mention of some T2 hyperintensities throughout the spine, considering history of breast cancer we do need to proceed with an MRI of the entire spine with and without contrast.

## 2022-10-16 NOTE — Addendum Note (Signed)
Addended by: Monica Becton on: 10/16/2022 10:27 AM   Modules accepted: Orders

## 2022-10-22 ENCOUNTER — Ambulatory Visit: Payer: Medicare Other

## 2022-10-22 DIAGNOSIS — M50223 Other cervical disc displacement at C6-C7 level: Secondary | ICD-10-CM | POA: Diagnosis not present

## 2022-10-22 DIAGNOSIS — M5021 Other cervical disc displacement,  high cervical region: Secondary | ICD-10-CM | POA: Diagnosis not present

## 2022-10-22 DIAGNOSIS — M899 Disorder of bone, unspecified: Secondary | ICD-10-CM

## 2022-10-22 DIAGNOSIS — M47817 Spondylosis without myelopathy or radiculopathy, lumbosacral region: Secondary | ICD-10-CM | POA: Diagnosis not present

## 2022-10-22 DIAGNOSIS — M47812 Spondylosis without myelopathy or radiculopathy, cervical region: Secondary | ICD-10-CM | POA: Diagnosis not present

## 2022-10-22 DIAGNOSIS — C50919 Malignant neoplasm of unspecified site of unspecified female breast: Secondary | ICD-10-CM | POA: Diagnosis not present

## 2022-10-22 DIAGNOSIS — M47814 Spondylosis without myelopathy or radiculopathy, thoracic region: Secondary | ICD-10-CM | POA: Diagnosis not present

## 2022-10-22 DIAGNOSIS — M47816 Spondylosis without myelopathy or radiculopathy, lumbar region: Secondary | ICD-10-CM | POA: Diagnosis not present

## 2022-10-22 DIAGNOSIS — M50221 Other cervical disc displacement at C4-C5 level: Secondary | ICD-10-CM | POA: Diagnosis not present

## 2022-10-22 MED ORDER — GADOBUTROL 1 MMOL/ML IV SOLN
5.0000 mL | Freq: Once | INTRAVENOUS | Status: AC | PRN
Start: 2022-10-22 — End: 2022-10-22
  Administered 2022-10-22: 5 mL via INTRAVENOUS

## 2022-10-29 ENCOUNTER — Telehealth: Payer: Self-pay | Admitting: Sports Medicine

## 2022-10-29 NOTE — Telephone Encounter (Signed)
Patient called she has heard anything about MRI results and is following up Please contact patient [phone]

## 2022-10-29 NOTE — Telephone Encounter (Signed)
 Patient advised.

## 2022-10-29 NOTE — Telephone Encounter (Signed)
They will show up in mychart when they have been read, still waiting on the radiologist to read them.

## 2022-10-30 ENCOUNTER — Inpatient Hospital Stay: Payer: Medicare Other | Attending: Hematology and Oncology | Admitting: Hematology and Oncology

## 2022-10-30 ENCOUNTER — Other Ambulatory Visit: Payer: Self-pay

## 2022-10-30 ENCOUNTER — Telehealth: Payer: Self-pay

## 2022-10-30 VITALS — BP 134/87 | HR 80 | Temp 97.2°F | Resp 18 | Ht 65.0 in | Wt 107.2 lb

## 2022-10-30 DIAGNOSIS — C50211 Malignant neoplasm of upper-inner quadrant of right female breast: Secondary | ICD-10-CM | POA: Insufficient documentation

## 2022-10-30 DIAGNOSIS — Z79810 Long term (current) use of selective estrogen receptor modulators (SERMs): Secondary | ICD-10-CM | POA: Insufficient documentation

## 2022-10-30 DIAGNOSIS — Z170 Estrogen receptor positive status [ER+]: Secondary | ICD-10-CM | POA: Diagnosis not present

## 2022-10-30 NOTE — Assessment & Plan Note (Signed)
09/26/2018: Right UOQ T1BN0 stage Ia grade 1 IDC ER/PR positive HER2 negative Ki-67 5% 10/24/2018: Right lumpectomy: T1BN0 stage Ia grade 1 IDC negative margins 0/3 lymph nodes negative 12/01/2018-12/29/2018: Adjuvant radiation October 2020: Tamoxifen started Genetics: No mutations   Tamoxifen toxicities: 1.  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  She is straining for a marathon.   Breast cancer surveillance: 1.  Breast exam 09/11/2022: Benign 2. Mammogram 08/21/2022: Benign breast density category B 3.  MRI thoracic spine and lumbar spine: Small lesions at T4, T11 and L2 nonspecific  Plan: Recommend obtaining a PET CT scan for further evaluation.  If that is negative then we can watch it with another MRI in 6 months.

## 2022-10-30 NOTE — Telephone Encounter (Signed)
Returned Pt's call regarding 10/22/22 MR results. Below is message from Pt's PCP on the result note:  "Arthritic changes throughout the lumbar spine as expected, there were some enhancing lesions in T4, T11, and L2, due to history of breast cancer I would like her to report this to her oncologist to discuss the neck step, options include repeat MRI with contrast in 6 months or PET/CT, my advice for her would be to discuss PET/CT with her oncologist"  Pt requesting in person MD appt. Same day MD appt made, Pt verbalized understanding.

## 2022-10-30 NOTE — Progress Notes (Signed)
Patient Care Team: Monica Becton, MD as PCP - General (Sports Medicine) Magrinat, Valentino Hue, MD (Inactive) as Consulting Physician (Oncology) Princess Bruins, MD as Consulting Physician (Ophthalmology) Antony Blackbird, MD as Consulting Physician (Radiation Oncology) Harriette Bouillon, MD as Consulting Physician (General Surgery)  DIAGNOSIS:  Encounter Diagnosis  Name Primary?   Malignant neoplasm of upper-inner quadrant of right breast in female, estrogen receptor positive (HCC) Yes    SUMMARY OF ONCOLOGIC HISTORY: Oncology History  Malignant neoplasm of upper-inner quadrant of right breast in female, estrogen receptor positive (HCC)  09/29/2018 Initial Diagnosis   Malignant neoplasm of upper-inner quadrant of right breast in female, estrogen receptor positive (HCC)   10/08/2018 Cancer Staging   Staging form: Breast, AJCC 8th Edition - Clinical: Stage IA (cT1b, cN0, cM0, G1, ER+, PR+, HER2-) - Signed by Lowella Dell, MD on 05/11/2020   10/30/2018 Cancer Staging   Staging form: Breast, AJCC 8th Edition - Pathologic stage from 10/30/2018: Stage IA (pT1b, pN0, cM0, G1, ER+, PR+, HER2-) - Signed by Loa Socks, NP on 11/05/2018   11/06/2018 Genetic Testing   Negative genetic testing on the [facility] Multi-Cancer panel.  The report date is 11/06/2018.  The Multi-Gene Panel offered by [facility] includes sequencing and/or deletion duplication testing of the following 85 genes: AIP, ALK, APC, ATM, AXIN2,BAP1,  BARD1, BLM, BMPR1A, BRCA1, BRCA2, BRIP1, CASR, CDC73, CDH1, CDK4, CDKN1B, CDKN1C, CDKN2A (p14ARF), CDKN2A (p16INK4a), CEBPA, CHEK2, CTNNA1, DICER1, DIS3L2, EGFR (c.2369C>T, p.Thr790Met variant only), EPCAM (Deletion/duplication testing only), FH, FLCN, GATA2, GPC3, GREM1 (Promoter region deletion/duplication testing only), HOXB13 (c.251G>A, p.Gly84Glu), HRAS, KIT, MAX, MEN1, MET, MITF (c.952G>A, p.Glu318Lys variant only), MLH1, MSH2, MSH3, MSH6, MUTYH, NBN, NF1,  NF2, NTHL1, PALB2, PDGFRA, PHOX2B, PMS2, POLD1, POLE, POT1, PRKAR1A, PTCH1, PTEN, RAD50, RAD51C, RAD51D, RB1, RECQL4, RET, RNF43, RUNX1, SDHAF2, SDHA (sequence changes only), SDHB, SDHC, SDHD, SMAD4, SMARCA4, SMARCB1, SMARCE1, STK11, SUFU, TERC, TERT, TMEM127, TP53, TSC1, TSC2, VHL, WRN and WT1.       CHIEF COMPLIANT: Follow-up on tamoxifen/Mri results  INTERVAL HISTORY: CARLIYAH COTTERMAN is a 72 y.o. with the above mention Estrogen receptor positive breast on Tamoxifen. She presents to the clinic for a follow-up.  She underwent an MRI of the thoracic spine and it showed a few abnormalities that were concerning and therefore she was referred back to [name] for discussion regarding her cancer.   ALLERGIES:  is allergic to augmentin [amoxicillin-pot clavulanate].  MEDICATIONS:  Current Outpatient Medications  Medication Sig Dispense Refill   calcium carbonate (OSCAL) 1500 (600 Ca) MG TABS tablet Take 1 tablet (1,500 mg total) by mouth 2 (two) times daily with a meal.     folic acid (FOLVITE) 1 MG tablet Take 1 tablet (1 mg total) by mouth daily. 90 tablet 3   loteprednol (LOTEMAX) 0.5 % ophthalmic suspension Place 1 drop into both eyes as directed. Reported on 06/07/2015     meloxicam (MOBIC) 15 MG tablet TAKE 1 TABLET BY MOUTH EVERY MORNING WITH FOOD FOR 2 WEEKS THEN DAILY AS NEEDED FOR PAIN 30 tablet 3   PREMARIN vaginal cream PLACE 1 APPLICATORFUL VAGINALLY DAILY. 30 g 12   tamoxifen (NOLVADEX) 20 MG tablet TAKE 1 TABLET BY MOUTH EVERY DAY 90 tablet 3   triazolam (HALCION) 0.25 MG tablet 1-2 tabs PO 2 hours before procedure or imaging.  Do not drive with this medication. 2 tablet 0   vitamin B-12 (CYANOCOBALAMIN) 1000 MCG tablet Take 1 tablet (1,000 mcg total) by mouth daily. 90 tablet 3  gabapentin (NEURONTIN) 300 MG capsule One tab PO qHS for a week, then BID for a week, then TID. May double weekly to a max of 3,600mg /day 90 capsule 3   No current facility-administered medications for this  visit.    PHYSICAL EXAMINATION: ECOG PERFORMANCE STATUS: 1 - Symptomatic but completely ambulatory  Vitals:   10/30/22 1517  BP: 134/87  Pulse: 80  Resp: 18  Temp: (!) 97.2 F (36.2 C)  SpO2: 97%   Filed Weights   10/30/22 1517  Weight: 107 lb 3.2 oz (48.6 kg)     LABORATORY DATA:  I have reviewed the data as listed    Latest Ref Rng & Units 09/11/2022    7:58 AM 09/06/2021    8:26 AM 05/26/2021   12:00 AM  CMP  Glucose 70 - 99 mg/dL 782  956  99   BUN 8 - 23 mg/dL 24  20  20    Creatinine 0.44 - 1.00 mg/dL 2.13  0.86  5.78   Sodium 135 - 145 mmol/L 144  141  143   Potassium 3.5 - 5.1 mmol/L 4.1  4.7  4.0   Chloride 98 - 111 mmol/L 109  107  106   CO2 22 - 32 mmol/L 29  29  30    Calcium 8.9 - 10.3 mg/dL 8.8  9.4  9.0   Total Protein 6.5 - 8.1 g/dL 6.5  6.9    Total Bilirubin 0.3 - 1.2 mg/dL 0.6  0.5    Alkaline Phos 38 - 126 U/L 30  39    AST 15 - 41 U/L 21  18    ALT 0 - 44 U/L 13  12      Lab Results  Component Value Date   WBC 3.7 (L) 09/11/2022   HGB 13.3 09/11/2022   HCT 40.3 09/11/2022   MCV 93.7 09/11/2022   PLT 116 (L) 09/11/2022   NEUTROABS 2.0 09/11/2022    ASSESSMENT & PLAN:  Malignant neoplasm of upper-inner quadrant of right breast in female, estrogen receptor positive (HCC) 09/26/2018: Right UOQ T1BN0 stage Ia grade 1 IDC ER/PR positive HER2 negative Ki-67 5% 10/24/2018: Right lumpectomy: T1BN0 stage Ia grade 1 IDC negative margins 0/3 lymph nodes negative 12/01/2018-12/29/2018: Adjuvant radiation October 2020: Tamoxifen started Genetics: No mutations   Tamoxifen toxicities: 1.  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  She is straining for a marathon.   Breast cancer surveillance: 1.  Breast exam 09/11/2022: Benign 2. Mammogram 08/21/2022: Benign breast density category B 3.  MRI thoracic spine and lumbar spine: Small lesions at T4, T11 and L2  nonspecific  Plan: Recommend obtaining a PET CT scan for further evaluation.  If that is negative then we can watch it with another MRI in 6 months.    Orders Placed This Encounter  Procedures   NM PET Image Initial (PI) Skull Base To Thigh    Standing Status:   Future    Standing Expiration Date:   10/30/2023    Order Specific Question:   If indicated for the ordered procedure, I authorize the administration of a radiopharmaceutical per Radiology protocol    Answer:   Yes    Order Specific Question:   Preferred imaging location?    Answer:   [facility]    Order Specific Question:   Release to patient    Answer:   Immediate   The  patient has a good understanding of the overall plan. she agrees with it. she will call with any problems that may develop before the next visit here. Total time spent: 30 mins including face to face time and time spent for planning, charting and co-ordination of care   Tamsen Meek, MD 10/30/22    I Janan Ridge am acting as a Neurosurgeon for [employer]  I have reviewed the above documentation for accuracy and completeness, and I agree with the above.

## 2022-10-31 ENCOUNTER — Encounter: Payer: Self-pay | Admitting: Sports Medicine

## 2022-10-31 ENCOUNTER — Telehealth: Payer: Self-pay | Admitting: Hematology and Oncology

## 2022-10-31 NOTE — Telephone Encounter (Signed)
Scheduled appointment per 7/16 los. Patient is aware of the made appointment.

## 2022-11-16 ENCOUNTER — Encounter (HOSPITAL_COMMUNITY): Admission: RE | Admit: 2022-11-16 | Payer: Medicare Other | Source: Ambulatory Visit

## 2022-11-16 DIAGNOSIS — C50211 Malignant neoplasm of upper-inner quadrant of right female breast: Secondary | ICD-10-CM | POA: Diagnosis not present

## 2022-11-16 DIAGNOSIS — Z170 Estrogen receptor positive status [ER+]: Secondary | ICD-10-CM | POA: Insufficient documentation

## 2022-11-16 DIAGNOSIS — C50919 Malignant neoplasm of unspecified site of unspecified female breast: Secondary | ICD-10-CM | POA: Diagnosis not present

## 2022-11-16 LAB — GLUCOSE, CAPILLARY: Glucose-Capillary: 116 mg/dL — ABNORMAL HIGH (ref 70–99)

## 2022-11-16 MED ORDER — FLUDEOXYGLUCOSE F - 18 (FDG) INJECTION
5.3800 | Freq: Once | INTRAVENOUS | Status: AC
  Administered 2022-11-16: 5.38 via INTRAVENOUS

## 2022-11-19 NOTE — Progress Notes (Signed)
HEMATOLOGY-ONCOLOGY TELEPHONE VISIT PROGRESS NOTE  I connected with our patient on 11/21/22 at  8:00 AM EDT by telephone and verified that I am speaking with the correct person using two identifiers.  I discussed the limitations, risks, security and privacy concerns of performing an evaluation and management service by telephone and the availability of in person appointments.  I also discussed with the patient that there may be a patient responsible charge related to this service. The patient expressed understanding and agreed to proceed.   History of Present Illness: Joan Lopez is a 72 y.o. with the above mention Estrogen receptor positive breast on Tamoxifen. She presents to the clinic for a telephone follow-upto review scans.  Oncology History  Malignant neoplasm of upper-inner quadrant of right breast in female, estrogen receptor positive (HCC)  09/29/2018 Initial Diagnosis   Malignant neoplasm of upper-inner quadrant of right breast in female, estrogen receptor positive (HCC)   10/08/2018 Cancer Staging   Staging form: Breast, AJCC 8th Edition - Clinical: Stage IA (cT1b, cN0, cM0, G1, ER+, PR+, HER2-) - Signed by Lowella Dell, MD on 05/11/2020   10/30/2018 Cancer Staging   Staging form: Breast, AJCC 8th Edition - Pathologic stage from 10/30/2018: Stage IA (pT1b, pN0, cM0, G1, ER+, PR+, HER2-) - Signed by Loa Socks, NP on 11/05/2018   11/06/2018 Genetic Testing   Negative genetic testing on the [facility] Multi-Cancer panel.  The report date is 11/06/2018.  The Multi-Gene Panel offered by [facility] includes sequencing and/or deletion duplication testing of the following 85 genes: AIP, ALK, APC, ATM, AXIN2,BAP1,  BARD1, BLM, BMPR1A, BRCA1, BRCA2, BRIP1, CASR, CDC73, CDH1, CDK4, CDKN1B, CDKN1C, CDKN2A (p14ARF), CDKN2A (p16INK4a), CEBPA, CHEK2, CTNNA1, DICER1, DIS3L2, EGFR (c.2369C>T, p.Thr790Met variant only), EPCAM (Deletion/duplication testing only), FH, FLCN, GATA2, GPC3,  GREM1 (Promoter region deletion/duplication testing only), HOXB13 (c.251G>A, p.Gly84Glu), HRAS, KIT, MAX, MEN1, MET, MITF (c.952G>A, p.Glu318Lys variant only), MLH1, MSH2, MSH3, MSH6, MUTYH, NBN, NF1, NF2, NTHL1, PALB2, PDGFRA, PHOX2B, PMS2, POLD1, POLE, POT1, PRKAR1A, PTCH1, PTEN, RAD50, RAD51C, RAD51D, RB1, RECQL4, RET, RNF43, RUNX1, SDHAF2, SDHA (sequence changes only), SDHB, SDHC, SDHD, SMAD4, SMARCA4, SMARCB1, SMARCE1, STK11, SUFU, TERC, TERT, TMEM127, TP53, TSC1, TSC2, VHL, WRN and WT1.       REVIEW OF SYSTEMS:   Constitutional: Denies fevers, chills or abnormal weight loss All other systems were reviewed with the patient and are negative. Observations/Objective:     Assessment Plan:  Malignant neoplasm of upper-inner quadrant of right breast in female, estrogen receptor positive (HCC) 09/26/2018: Right UOQ T1BN0 stage Ia grade 1 IDC ER/PR positive HER2 negative Ki-67 5% 10/24/2018: Right lumpectomy: T1BN0 stage Ia grade 1 IDC negative margins 0/3 lymph nodes negative 12/01/2018-12/29/2018: Adjuvant radiation October 2020: Tamoxifen started Genetics: No mutations   Tamoxifen toxicities: 1.  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  She is straining for a marathon.   Breast cancer surveillance: 1.  Breast exam 09/11/2022: Benign 2. Mammogram 08/21/2022: Benign breast density category B 3.  MRI thoracic spine and lumbar spine: Small lesions at T4, T11 and L2 nonspecific 4.  PET/CT scan 11/19/2022: No hypermetabolic activity noted in the spine.   Plan: We we will repeat another MRI of the spine in May 2025.  She has an existing follow-up appointment at the end of May.    I discussed the assessment and treatment plan with the patient. The patient was provided an opportunity to  ask questions and all were answered. The patient agreed with the plan and demonstrated an understanding of the instructions. The  patient was advised to call back or seek an in-person evaluation if the symptoms worsen or if the condition fails to improve as anticipated.   I provided 12 minutes of non-face-to-face time during this encounter.  This includes time for charting and coordination of care   Tamsen Meek, MD  I Janan Ridge am acting as a scribe for Dr.Vinay Gudena  I have reviewed the above documentation for accuracy and completeness, and I agree with the above.

## 2022-11-20 ENCOUNTER — Ambulatory Visit: Payer: Medicare Other | Admitting: Sports Medicine

## 2022-11-21 ENCOUNTER — Inpatient Hospital Stay: Payer: Medicare Other | Attending: Hematology and Oncology | Admitting: Hematology and Oncology

## 2022-11-21 DIAGNOSIS — Z170 Estrogen receptor positive status [ER+]: Secondary | ICD-10-CM

## 2022-11-21 DIAGNOSIS — C50211 Malignant neoplasm of upper-inner quadrant of right female breast: Secondary | ICD-10-CM

## 2022-11-21 DIAGNOSIS — M899 Disorder of bone, unspecified: Secondary | ICD-10-CM | POA: Diagnosis not present

## 2022-11-21 NOTE — Assessment & Plan Note (Signed)
09/26/2018: Right UOQ T1BN0 stage Ia grade 1 IDC ER/PR positive HER2 negative Ki-67 5% 10/24/2018: Right lumpectomy: T1BN0 stage Ia grade 1 IDC negative margins 0/3 lymph nodes negative 12/01/2018-12/29/2018: Adjuvant radiation October 2020: Tamoxifen started Genetics: No mutations   Tamoxifen toxicities: 1.  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  She is straining for a marathon.   Breast cancer surveillance: 1.  Breast exam 09/11/2022: Benign 2. Mammogram 08/21/2022: Benign breast density category B 3.  MRI thoracic spine and lumbar spine: Small lesions at T4, T11 and L2 nonspecific 4.  PET/CT scan 11/19/2022: No hypermetabolic activity noted in the spine.   Plan: We we will repeat another MRI of the spine in May 2025.  She has an existing follow-up appointment at the end of May.

## 2022-11-30 DIAGNOSIS — S76301S Unspecified injury of muscle, fascia and tendon of the posterior muscle group at thigh level, right thigh, sequela: Secondary | ICD-10-CM | POA: Diagnosis not present

## 2022-12-06 DIAGNOSIS — S76311A Strain of muscle, fascia and tendon of the posterior muscle group at thigh level, right thigh, initial encounter: Secondary | ICD-10-CM | POA: Diagnosis not present

## 2022-12-06 DIAGNOSIS — S76301S Unspecified injury of muscle, fascia and tendon of the posterior muscle group at thigh level, right thigh, sequela: Secondary | ICD-10-CM | POA: Diagnosis not present

## 2022-12-11 DIAGNOSIS — S76301S Unspecified injury of muscle, fascia and tendon of the posterior muscle group at thigh level, right thigh, sequela: Secondary | ICD-10-CM | POA: Diagnosis not present

## 2022-12-19 ENCOUNTER — Ambulatory Visit (INDEPENDENT_AMBULATORY_CARE_PROVIDER_SITE_OTHER): Payer: Medicare Other | Admitting: Sports Medicine

## 2022-12-19 VITALS — BP 116/84 | Ht 60.5 in | Wt 109.0 lb

## 2022-12-19 DIAGNOSIS — S76311A Strain of muscle, fascia and tendon of the posterior muscle group at thigh level, right thigh, initial encounter: Secondary | ICD-10-CM | POA: Insufficient documentation

## 2022-12-19 DIAGNOSIS — R29898 Other symptoms and signs involving the musculoskeletal system: Secondary | ICD-10-CM

## 2022-12-19 MED ORDER — AMITRIPTYLINE HCL 25 MG PO TABS: 25.0000 mg | ORAL_TABLET | Freq: Every day | ORAL | 3 refills | Status: DC

## 2022-12-19 NOTE — Progress Notes (Signed)
Chief complaint: Chronic right hamstring pain since March 12  Patient was referred by Dr. Danielle Dess Because of the chronic hamstring issues she had evaluation of her lumbar spine Dr. Benjamin Stain had treated her originally with rehabilitation and standard hamstring support including a hamstring sleeve However she continued to have some pain although she was able to run with her injury She would get sciatic symptoms within 20 minutes of sitting in a car that radiated all the way down her leg  Her MRI did show some circumscribed bony lesions in her lumbar vertebrae.  With a history of breast cancer 5 years ago she underwent further evaluation including a PET scan.  None of these truly looked metastatic and they were not hypermetabolic.  Dr. Danielle Dess reviewed her lumbar spine films and even though she has some chronic degenerative disc disease and more significant changes at L5-S1 her primary problems did not seem to be coming from her lumbar spine.  She is coming to me for my opinion  Physical exam Pleasant older female in no acute distress BP 116/84   Ht 5' 0.5" (1.537 m)   Wt 109 lb (49.4 kg)   BMI 20.94 kg/m   I am testing her right hamstring shows significant weakness There is no tenderness at the ischial tuberosity but some 3 to 4 cm below that in the upper hamstring body She has significant hip abduction weakness only on the right side with a strong left side Neuro testing was not remarkable with normal straight leg raise Rich of the area did not bring out any sciatic symptoms Running gait reveals that she has an efficient stride with no limp  3 hop test shows that she was significantly shorter on her hops on the right leg than on the left

## 2022-12-19 NOTE — Assessment & Plan Note (Signed)
Patient still has significant weakness of her right hip abductors which it appears was first noted by Dr. Benjamin Stain in November 2022  This is likely contributing to increased weakness in the right leg and problems with the hamstring getting overworked as she prepares for a marathon We gave her a hip abduction series with 4 exercises that she needs to progress fairly rapidly to include using ankle weights  Hopefully the added strength will help her be successful in completing her run at the [place] marathon

## 2022-12-19 NOTE — Assessment & Plan Note (Addendum)
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

## 2023-01-10 ENCOUNTER — Other Ambulatory Visit: Payer: Self-pay | Admitting: Sports Medicine

## 2023-01-30 ENCOUNTER — Ambulatory Visit: Payer: Medicare Other | Admitting: Sports Medicine

## 2023-02-01 DIAGNOSIS — Z947 Corneal transplant status: Secondary | ICD-10-CM | POA: Diagnosis not present

## 2023-02-01 DIAGNOSIS — H5212 Myopia, left eye: Secondary | ICD-10-CM | POA: Diagnosis not present

## 2023-02-01 DIAGNOSIS — H18513 Endothelial corneal dystrophy, bilateral: Secondary | ICD-10-CM | POA: Diagnosis not present

## 2023-02-05 ENCOUNTER — Ambulatory Visit (INDEPENDENT_AMBULATORY_CARE_PROVIDER_SITE_OTHER): Payer: Medicare Other | Admitting: Sports Medicine

## 2023-02-05 VITALS — BP 136/84 | Ht 60.5 in | Wt 119.0 lb

## 2023-02-05 DIAGNOSIS — R531 Weakness: Secondary | ICD-10-CM

## 2023-02-05 DIAGNOSIS — I6782 Cerebral ischemia: Secondary | ICD-10-CM | POA: Diagnosis not present

## 2023-02-05 DIAGNOSIS — R2689 Other abnormalities of gait and mobility: Secondary | ICD-10-CM

## 2023-02-05 DIAGNOSIS — R29898 Other symptoms and signs involving the musculoskeletal system: Secondary | ICD-10-CM | POA: Diagnosis not present

## 2023-02-05 NOTE — Progress Notes (Signed)
Chief complaint significant balance issue with running   Patient just returned from the [place] marathon Approximately 18 miles into the race she had to stop She found herself leaning to her right side and unable to run in a straight line This also happened in 2022 at the same spot of the race In her training runs she has never noticed this She just ran a half marathon this past weekend without problems  We were seeing her for sciatic symptoms related to her hamstring Those have been somewhat better but still occur Dr. Benjamin Stain tried her own gabapentin but she did not see benefit  She has been doing hip abduction and hamstring exercises  Physical exam Pleasant elderly female in no acute distress BP 136/84   Ht 5' 0.5" (1.537 m)   Wt 119 lb (54 kg)   BMI 22.86 kg/m   Testing of her hamstring reveal good strength today on resistance without any triggered sciatica Testing of hip abduction still reveals some moderate weakness on the right side but not the left  Neurologic testing revealed an alert female Cranial nerve testing was normal Cerebellar testing with finger-to-nose and with foot motions was normal Balance testing on the right leg revealed some instability Left leg definitely was improved She is unable to balance on 1 leg with her eyes closed Rotation equilibrium testing did not reveal any instability or nystagmus No noted sensory change Good strength testing in both upper extremities as well as lower extremities

## 2023-02-05 NOTE — Assessment & Plan Note (Signed)
Plan for MR angiogram Baby aspirin each day Discuss with Dr. Benjamin Stain her cholesterol levels which have been good in the past  I am concerned that her balance is poor for a athletic older female and may require further evaluation

## 2023-02-05 NOTE — Addendum Note (Signed)
Addended by: Annita Brod on: 02/05/2023 11:24 AM   Modules accepted: Orders

## 2023-02-06 ENCOUNTER — Ambulatory Visit: Payer: Medicare Other | Admitting: Sports Medicine

## 2023-02-13 ENCOUNTER — Encounter: Payer: Self-pay | Admitting: Sports Medicine

## 2023-02-13 ENCOUNTER — Ambulatory Visit (INDEPENDENT_AMBULATORY_CARE_PROVIDER_SITE_OTHER): Payer: Medicare Other | Admitting: Sports Medicine

## 2023-02-13 VITALS — BP 130/83 | HR 79 | Ht 60.5 in | Wt 110.0 lb

## 2023-02-13 DIAGNOSIS — I10 Essential (primary) hypertension: Secondary | ICD-10-CM | POA: Diagnosis not present

## 2023-02-13 DIAGNOSIS — H8110 Benign paroxysmal vertigo, unspecified ear: Secondary | ICD-10-CM | POA: Insufficient documentation

## 2023-02-13 DIAGNOSIS — R739 Hyperglycemia, unspecified: Secondary | ICD-10-CM

## 2023-02-13 DIAGNOSIS — H8113 Benign paroxysmal vertigo, bilateral: Secondary | ICD-10-CM

## 2023-02-13 NOTE — Progress Notes (Signed)
    Procedures performed today:    None.  Independent interpretation of notes and tests performed by another provider:   None.  Brief History, Exam, Impression, and Recommendations:    Benign paroxysmal positional vertigo Some vertiginous symptoms, brain MRI/MRI ordered, adding vestibular rehab at home with the Epley maneuver. We will revisit this in about 6 weeks.    ____________________________________________ Joan Lopez. Benjamin Stain, M.D., ABFM., CAQSM., AME. Primary Care and Sports Medicine  [facility]  Adjunct Professor of Family Medicine  [employer]  Restaurant manager, fast food

## 2023-02-13 NOTE — Assessment & Plan Note (Signed)
Some vertiginous symptoms, brain MRI/MRI ordered, adding vestibular rehab at home with the Epley maneuver. We will revisit this in about 6 weeks.

## 2023-02-14 LAB — HEMOGLOBIN A1C
Est. average glucose Bld gHb Est-mCnc: 105 mg/dL
Hgb A1c MFr Bld: 5.3 % (ref 4.8–5.6)

## 2023-02-14 LAB — LIPID PANEL
Chol/HDL Ratio: 1.8 ratio (ref 0.0–4.4)
Cholesterol, Total: 196 mg/dL (ref 100–199)
HDL: 110 mg/dL (ref >39)
LDL Chol Calc (NIH): 77 mg/dL (ref 0–99)
Triglycerides: 45 mg/dL (ref 0–149)
VLDL Cholesterol Cal: 9 mg/dL (ref 5–40)

## 2023-02-14 LAB — TSH: TSH: 1.21 u[IU]/mL (ref 0.450–4.500)

## 2023-02-19 ENCOUNTER — Other Ambulatory Visit: Payer: Medicare Other

## 2023-02-19 ENCOUNTER — Ambulatory Visit
Admission: RE | Admit: 2023-02-19 | Discharge: 2023-02-19 | Disposition: A | Discharge status: Home / Self Care | Payer: Medicare Other | Source: Ambulatory Visit | Attending: Sports Medicine | Admitting: Sports Medicine

## 2023-02-19 DIAGNOSIS — R270 Ataxia, unspecified: Secondary | ICD-10-CM | POA: Diagnosis not present

## 2023-02-19 DIAGNOSIS — R29898 Other symptoms and signs involving the musculoskeletal system: Secondary | ICD-10-CM

## 2023-02-19 DIAGNOSIS — I6782 Cerebral ischemia: Secondary | ICD-10-CM

## 2023-02-19 DIAGNOSIS — R2689 Other abnormalities of gait and mobility: Secondary | ICD-10-CM

## 2023-02-19 DIAGNOSIS — R531 Weakness: Secondary | ICD-10-CM

## 2023-03-25 ENCOUNTER — Encounter: Payer: Self-pay | Admitting: Family Medicine

## 2023-03-25 ENCOUNTER — Ambulatory Visit (INDEPENDENT_AMBULATORY_CARE_PROVIDER_SITE_OTHER): Payer: Medicare Other | Admitting: Family Medicine

## 2023-03-25 VITALS — BP 128/84 | Ht 60.5 in | Wt 110.0 lb

## 2023-03-25 DIAGNOSIS — R2689 Other abnormalities of gait and mobility: Secondary | ICD-10-CM

## 2023-03-25 NOTE — Progress Notes (Signed)
DATE OF VISIT: 03/25/2023        Joan Lopez DOB: 24-Sep-1950 MRN: [id number]  CC:  f/u MRI/MRI results  History of present Illness: Joan Lopez is a 72 y.o. female who presents for a follow-up visit for MRI results Seen by Joan Lopez 02/05/23 with concerns of balance issues -Experienced balance issues, as well as trouble running in a straight line during recent [employer].  Had similar experience in 2022, also in the [place] marathon.  Both episodes at about the 29/30 km mark -She did not receive any evaluations at the time of the race or immediately after -She has not had any further episodes, has not run same distances as the marathons, but has done multiple half marathons without any issues  Today she reports she is feeling well MRI/MRA brain completed 02/19/2023 showing no acute abnormalities  PCP - Joan Lopez referred her to vestibular therapy -She did not end up scheduling because any dizziness she was experiencing completely resolved  Today she reports no further issues with balance Sometimes notices right shoulder is lower when she is running Denies any other issues Possible family history of stroke in mother in her later years No significant family history for cholesterol or cardiac issues Patient is not a smoker  Medications:  Outpatient Encounter Medications as of 03/25/2023  Medication Sig   amitriptyline (ELAVIL) 25 MG tablet Take 1 tablet (25 mg total) by mouth at bedtime.   calcium carbonate (OSCAL) 1500 (600 Ca) MG TABS tablet Take 1 tablet (1,500 mg total) by mouth 2 (two) times daily with a meal.   folic acid (FOLVITE) 1 MG tablet Take 1 tablet (1 mg total) by mouth daily.   gabapentin (NEURONTIN) 300 MG capsule One tab PO qHS for a week, then BID for a week, then TID. May double weekly to a max of 3,600mg /day   loteprednol (LOTEMAX) 0.5 % ophthalmic suspension Place 1 drop into both eyes as directed. Reported on 06/07/2015   meloxicam (MOBIC) 15 MG tablet  TAKE 1 TABLET BY MOUTH EVERY MORNING WITH FOOD FOR 2 WEEKS THEN DAILY AS NEEDED FOR PAIN   PREMARIN vaginal cream PLACE 1 APPLICATORFUL VAGINALLY DAILY.   tamoxifen (NOLVADEX) 20 MG tablet TAKE 1 TABLET BY MOUTH EVERY DAY   triazolam (HALCION) 0.25 MG tablet 1-2 tabs PO 2 hours before procedure or imaging.  Do not drive with this medication.   vitamin B-12 (CYANOCOBALAMIN) 1000 MCG tablet Take 1 tablet (1,000 mcg total) by mouth daily.   No facility-administered encounter medications on file as of 03/25/2023.    Allergies: is allergic to amoxicillin, augmentin [amoxicillin-pot clavulanate], and clavulanic acid.  Physical Examination: Vitals: BP 128/84   Ht 5' 0.5" (1.537 m)   Wt 110 lb (49.9 kg)   BMI 21.13 kg/m  GENERAL:  Joan Lopez is a 72 y.o. female appearing their stated age, alert and oriented x 3, in no apparent distress.  MSK: Normal upper extremity and lower extremity strength.  Normal gait. NEURO: Cranial nerves II through XII are grossly intact, no focal deficits.  DTR 2/4 bicep, tricep, brachioradialis, Achilles, patella bilaterally.  Negative Romberg, no pronator drift.  Normal finger-to-nose testing.  Normal heel-to-shin testing.  Normal rapid alternating movements.  Sensation intact to light touch.  Slight balance issues when trying to balance on 1 leg with eyes closed, but was able to perform this with minimal support/assistance VASC: no edema  Radiology: MRI:  MR angiogram of the head without contrast 02/19/2023 showing: -  Unremarkable MRA  Assessment & Plan Balance problem Balance problem with possible transient lower extremity weakness and an athlete.  Did have slight balance difficulties today with single-leg stance with eyes closed, but otherwise exam was unremarkable -Has had no further reoccurrences since [place] marathon, and has done strenuous exercises and has run up to half marathon distance without any issues  Plan: -Reviewed MRA results with her in  detail, explained exam was reassuring -Cannot explain direct etiology of her symptoms, but could be multifactorial.  Could be related to transient weakness, versus electrolyte dysfunction (as patient notes she does not regularly drink well racing and marathons), versus other etiology such as TIA/stroke.  TIA/stroke is less likely given normal MRI -Reviewed recent lipid panel, thyroid panel, A1c completed by PCP 02/13/2023 which were all normal -Okay for her to continue activity as tolerated.  Red flag symptoms reviewed and patient expressed understanding of when she should seek medical care or go to urgent care/ER for evaluation.  She is vies to reach out with any questions or concerns -She will continue to follow-up with her PCP as directed.  She will follow-up with [name] on an as-needed basis Patient expressed understanding & agreement with above.  Encounter Diagnosis  Name Primary?   Balance problem Yes    No orders of the defined types were placed in this encounter.

## 2023-03-27 ENCOUNTER — Ambulatory Visit (INDEPENDENT_AMBULATORY_CARE_PROVIDER_SITE_OTHER): Payer: Medicare Other | Admitting: Sports Medicine

## 2023-03-27 ENCOUNTER — Encounter: Payer: Self-pay | Admitting: Sports Medicine

## 2023-03-27 VITALS — BP 154/97 | HR 79 | Ht 60.05 in | Wt 111.0 lb

## 2023-03-27 DIAGNOSIS — H8113 Benign paroxysmal vertigo, bilateral: Secondary | ICD-10-CM | POA: Diagnosis not present

## 2023-03-27 NOTE — Assessment & Plan Note (Signed)
BPPV, brain MRI MRA negative, she is doing a lot better but would like to do some vestibular rehab to learn how to do the Epley maneuver at home, she also like some balance training.

## 2023-03-27 NOTE — Progress Notes (Signed)
    Procedures performed today:    None.  Independent interpretation of notes and tests performed by another provider:   None.  Brief History, Exam, Impression, and Recommendations:    Benign paroxysmal positional vertigo BPPV, brain MRI MRA negative, she is doing a lot better but would like to do some vestibular rehab to learn how to do the Epley maneuver at home, she also like some balance training.  I spent 30 minutes of total time managing this patient today, this includes chart review, face to face, and non-face to face time.  Briggett did have multiple questions about her husband's management and I answered them all.  ____________________________________________ Ihor Austin. Benjamin Stain, M.D., ABFM., CAQSM., AME. Primary Care and Sports Medicine [facility]  Adjunct Professor of Family Medicine  [employer]  Restaurant manager, fast food

## 2023-04-04 ENCOUNTER — Ambulatory Visit: Payer: Medicare Other | Admitting: Rehabilitative and Restorative Service Providers"

## 2023-04-12 ENCOUNTER — Encounter: Payer: Self-pay | Admitting: Rehabilitative and Restorative Service Providers"

## 2023-04-12 ENCOUNTER — Other Ambulatory Visit: Payer: Self-pay

## 2023-04-12 ENCOUNTER — Ambulatory Visit: Payer: Medicare Other | Attending: Sports Medicine | Admitting: Rehabilitative and Restorative Service Providers"

## 2023-04-12 DIAGNOSIS — R2681 Unsteadiness on feet: Secondary | ICD-10-CM | POA: Diagnosis present

## 2023-04-12 DIAGNOSIS — H8113 Benign paroxysmal vertigo, bilateral: Secondary | ICD-10-CM | POA: Diagnosis not present

## 2023-04-12 DIAGNOSIS — R2689 Other abnormalities of gait and mobility: Secondary | ICD-10-CM | POA: Diagnosis not present

## 2023-04-12 NOTE — Therapy (Signed)
OUTPATIENT PHYSICAL THERAPY VESTIBULAR EVALUATION     Patient Name: Joan Lopez MRN: [id number] DOB:10-Dec-1950, 72 y.o., female Today's Date: 04/12/2023  END OF SESSION:  PT End of Session - 04/12/23 1226     Visit Number 1    Number of Visits 8    Date for PT Re-Evaluation 06/11/23    Authorization Type medicare/ BCBS    PT Start Time 0804    PT Stop Time 0849    PT Time Calculation (min) 45 min    Activity Tolerance Patient tolerated treatment well    Behavior During Therapy [facility] for tasks assessed/performed             Past Medical History:  Diagnosis Date   Abnormal LFTs 10/29/2012   Acquired bilateral renal cysts    Arthritis    Bilateral bunions    Constipation 10/16/2011   DDD (degenerative disc disease)    spine w narrowing   Dehydration, mild 12/26/2011   Diverticulitis    Exercise counseling 11/14/2011   Family history of brain cancer    Family history of breast cancer    Family history of cancer    Family history of colon cancer    Family history of kidney cancer    Family history of oral cancer    Family history of stomach cancer    Fuch's endothelial dystrophy    Guttate psoriasis    Hematuria 08/24/2011   Hemorrhoids    History of chicken pox    Hyperlipidemia    Kidney stones    Lipoma of arm 07/13/2012   Lipoma of arm 10/07/2012   Left    Mass of arm 07/13/2012   Has had them in past     Personal history of radiation therapy    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   Post-menopausal bleeding 10/08/2011   Reflux 04/17/2012   Renal insufficiency 01/11/2013   Thrombocytopenia (HCC) 09/27/2011   Past Surgical History:  Procedure Laterality Date   APPENDECTOMY  1986   BREAST LUMPECTOMY Right 10/2018   BREAST LUMPECTOMY WITH RADIOACTIVE SEED AND SENTINEL LYMPH NODE BIOPSY Right 10/24/2018   Procedure: RIGHT BREAST LUMPECTOMY WITH RADIOACTIVE SEED AND RIGHT AXILLARY SENTINEL LYMPH NODE BIOPSY, RIGHT BLUE DYE INJECTION;  Surgeon: Claud Kelp, MD;   Location: [facility];  Service: General;  Laterality: Right;  PEC BLOCK   CESAREAN SECTION     CHOLECYSTECTOMY     COLONOSCOPY  2005   (records here)[place] - diverticulosis and hemorrhoids   CORNEAL TRANSPLANT  2013   CYSTOSCOPY WITH RETROGRADE PYELOGRAM, URETEROSCOPY AND STENT PLACEMENT Right 10/22/2013   Procedure: CYSTOSCOPY WITH RETROGRADE PYELOGRAM, URETEROSCOPY ;  Surgeon: Heloise Purpura, MD;  Location: [facility];  Service: Urology;  Laterality: Right;   LIPOMA EXCISION  2014   x 3, both arms   TONSILLECTOMY     possible adenoids   TOTAL ABDOMINAL HYSTERECTOMY  1991   for fibroids   TUBAL LIGATION     WRIST FRACTURE SURGERY     right, titanium plate and pins   Patient Active Problem List   Diagnosis Date Noted   Benign paroxysmal positional vertigo 02/13/2023   Transient weakness of right lower extremity 02/05/2023   Hamstring strain, right, initial encounter 12/19/2022   Lesion of lumbar spine 10/15/2022   Seborrheic keratosis 10/10/2022   Closed fracture of base of fifth metatarsal bone with routine healing, right 05/26/2021   Ocular migraine 05/26/2021   Visual changes 03/22/2021   Anticipatory grieving 03/22/2021  Deficiency of vitamin B12 02/08/2021   Weakness of hip 01/17/2021   Hearing loss due to cerumen impaction, right 01/18/2020   Atrophic vaginitis 01/18/2020   Osteoporosis 03/10/2019   Genetic testing 11/06/2018   Tick bite 11/03/2018   Family history of breast cancer    Family history of colon cancer    Family history of stomach cancer    Family history of brain cancer    Family history of kidney cancer    Family history of oral cancer    Family history of cancer    Malignant neoplasm of upper-inner quadrant of right breast in female, estrogen receptor positive (HCC) 09/29/2018   Plantar fasciitis, right 08/15/2018   Right buttock pain 11/07/2015   Myalgia 11/07/2015   Exercise counseling 03/01/2015   Dysfunction of right rotator cuff  12/14/2014   Skin tag 12/14/2014   Tongue lesion 06/14/2014   Primary osteoarthritis of both knees 05/17/2014   Annual physical exam 08/20/2013   Benign essential hypertension 08/20/2013   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   Reflux 04/17/2012   Post-menopausal bleeding 10/08/2011   Thrombocytopenia (HCC) 09/27/2011   Hematuria 08/24/2011   Trigeminal neuralgia 02/06/2011   Fuchs' corneal dystrophy 11/28/2010   Radiculitis of left cervical region 04/06/2009   Generalized anxiety disorder 05/31/2008    PCP: Rodney Langton, MD REFERRING PROVIDER: Rodney Langton, MD  REFERRING DIAG: H81.13 (ICD-10-CM) - Benign paroxysmal positional vertigo due to bilateral vestibular disorder   THERAPY DIAG:  Other abnormalities of gait and mobility  Unsteadiness on feet  ONSET DATE: 03/27/23  Rationale for Evaluation and Treatment: Rehabilitation  SUBJECTIVE:   SUBJECTIVE STATEMENT: The patient reports sudden onset of "bed spins" earlier in December. She reports she wants to learn the Epley maneuver. She notes the vertigo has completely stopped. "I want to know how to do it if it starts again." She also reports her balance is not good any more. She wants [name] to focus on balance and learn some exercises to add to her current routine. She has a h/o R hamstring inflammation. 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   PERTINENT HISTORY: ocular migraine, vitamin D deficiency, 2020 breast cancer  PAIN:  Are you having pain? No  PRECAUTIONS: None  WEIGHT BEARING RESTRICTIONS: No  FALLS: Has patient fallen in last 6 months? No  LIVING ENVIRONMENT: Lives with: lives with their spouse Lives in: House/apartment Stairs: Yes: Internal: 12 steps;    PLOF: Independent  PATIENT GOALS: improve balance, learn self Epley  OBJECTIVE:  Note: Objective measures were completed at Evaluation unless otherwise  noted.  COGNITION: Overall cognitive status: Within functional limits for tasks assessed   SENSATION: WFL  POSTURE:  No Significant postural limitations  Cervical ROM:  WNLs  STRENGTH: WFLs  PATIENT SURVEYS:  N/a--not FOTO for vestibular  VESTIBULAR ASSESSMENT:  GENERAL OBSERVATION: Patient walks independently at good pace entering clinic   SYMPTOM BEHAVIOR:  Subjective history: 3 day episode of spinning in bed upon waking, improved. Currently notes instability worse in the morning.  Non-Vestibular symptoms: changes in vision and corneal transplants/ mono vision-- distance in one eye and near in one eye (notes sometimes gets images merging together  Type of dizziness: Spinning/Vertigo and Unsteady with head/body turns  Frequency: unsteadiness daily  Duration: seconds  Aggravating factors: Worse in the morning and positional symptoms have improved  Relieving factors:  imrpoved since onset  OCULOMOTOR EXAM:  Ocular  Alignment:  L eye adducted   Ocular ROM: No Limitations  Spontaneous Nystagmus: absent  Gaze-Induced Nystagmus: absent  Smooth Pursuits: intact  Saccades: intact   VESTIBULAR - OCULAR REFLEX:   Slow VOR: Comment: L eye adducts, creates double vision (* L eye ocular migraine-- not on R at all/  has had 2 episodes)  VOR Cancellation: Normal  Head-Impulse Test: HIT Right: positive HIT Left: positive    POSITIONAL TESTING: Right Dix-Hallpike: no nystagmus Left Dix-Hallpike: no nystagmus Right Roll Test: no nystagmus Left Roll Test: no nystagmus  BALANCE:  Foam with eyes closed with significant sway. Tandem stance in corner able to do x 15 seconds bilaterally without UE support.  [facility] Adult PT Treatment:                                                DATE: 04/12/23 NMR: Balance training in corner on foam with eyes closed Corner standing with head motion horizontal and vertical Corner standing with tandem stance Single leg stance for HEP Gaze adaptation  x 1 viewing seated in horizontal plane  PATIENT EDUCATION: Education details: plan of care, nature of BPPV, balance rationale and HEP Person educated: Patient Education method: Explanation, Demonstration, and Handouts Education comprehension: verbalized understanding, returned demonstration, and needs further education  HOME EXERCISE PROGRAM: Access Code: [email or web address] URL: [email or web address] Date: 04/12/2023 Prepared by: Margretta Ditty  Exercises - Standing balance with eyes closed on a pillow  - 1 x daily - 7 x weekly - 1 sets - 3 reps - 30 seconds hold - Standing Single Leg Stance with Counter Support  - 1 x daily - 7 x weekly - 1 sets - 10 reps - Seated Gaze Stabilization with Head Rotation  - 2 x daily - 7 x weekly - 3 sets - 1 reps - 30 seconds hold  GOALS: Goals reviewed with patient? Yes  SHORT TERM GOALS: Target date: 05/12/23  The patient will be indep with HEP. Baseline: initiated at eval Goal status: INITIAL  2.  The patient will verbalize understanding of self epley's. Baseline: to instruct Goal status: INITIAL  LONG TERM GOALS: Target date: 06/10/22  The patient will be indep with progression of HEP. Baseline:  initiated at eval Goal status: INITIAL  2.  The patient will tolerate foam with eyes closed x 30 seconds. Baseline:  Holds 10 seconds with increased sway. Goal status: INITIAL  3.  The patient will demo single leg standing > 5 seconds bilateral Les. Baseline:  to assess Goal status: INITIAL  ASSESSMENT:  CLINICAL IMPRESSION: Patient is a 72 y.o. female who was seen today for physical therapy evaluation and treatment for h/o BPPV and imbalance. She presents with vertigo resolved since onset earlier in December with no nystagmus with positional testing. Patient notes imbalance upon rising in the morning and general unsteadiness. PT to address deficits to teach HEP and self epley maneuver.    OBJECTIVE IMPAIRMENTS: decreased balance and  dizziness.   ACTIVITY LIMITATIONS:  imbalance in morning  PARTICIPATION LIMITATIONS:  n/a- able to continue daily tasks  PERSONAL FACTORS: 1-2 comorbidities: ocular migraine  are also affecting patient's functional outcome.   REHAB POTENTIAL: Good  CLINICAL DECISION MAKING: Stable/uncomplicated  EVALUATION COMPLEXITY: Low   PLAN:  PT FREQUENCY: 1x/week  PT DURATION: 8 weeks  PLANNED INTERVENTIONS: 97164- PT Re-evaluation, 97110-Therapeutic exercises, 97530- Therapeutic activity, 97112-  Neuromuscular re-education, [phone]- Self Care, 19147- Manual therapy, [id number]- Gait training, [phone]- Canalith repositioning, and Vestibular training  PLAN FOR NEXT SESSION: progress gaze to standing, add further balance activities (emphasize with current handouts-- has the diver), and assess single leg standing.  Zafiro Routson, PT 04/12/2023, 12:27 PM

## 2023-04-24 ENCOUNTER — Ambulatory Visit: Payer: Medicare Other | Attending: Sports Medicine | Admitting: Rehabilitative and Restorative Service Providers"

## 2023-04-24 ENCOUNTER — Encounter: Payer: Self-pay | Admitting: Rehabilitative and Restorative Service Providers"

## 2023-04-24 DIAGNOSIS — R2689 Other abnormalities of gait and mobility: Secondary | ICD-10-CM | POA: Insufficient documentation

## 2023-04-24 DIAGNOSIS — R2681 Unsteadiness on feet: Secondary | ICD-10-CM | POA: Insufficient documentation

## 2023-04-24 NOTE — Therapy (Signed)
 OUTPATIENT PHYSICAL THERAPY VESTIBULAR TREATMENT   Patient Name: Joan Lopez MRN: [id number] DOB:15-Oct-1950, 73 y.o., female Today's Date: 04/24/2023  END OF SESSION:  PT End of Session - 04/24/23 0931     Visit Number 2    Number of Visits 8    Date for PT Re-Evaluation 06/11/23    Authorization Type medicare/ BCBS    PT Start Time [phone]    PT Stop Time 1014    PT Time Calculation (min) 41 min    Activity Tolerance Patient tolerated treatment well    Behavior During Therapy [facility] for tasks assessed/performed            Past Medical History:  Diagnosis Date   Abnormal LFTs 10/29/2012   Acquired bilateral renal cysts    Arthritis    Bilateral bunions    Constipation 10/16/2011   DDD (degenerative disc disease)    spine w narrowing   Dehydration, mild 12/26/2011   Diverticulitis    Exercise counseling 11/14/2011   Family history of brain cancer    Family history of breast cancer    Family history of cancer    Family history of colon cancer    Family history of kidney cancer    Family history of oral cancer    Family history of stomach cancer    Fuch's endothelial dystrophy    Guttate psoriasis    Hematuria 08/24/2011   Hemorrhoids    History of chicken pox    Hyperlipidemia    Kidney stones    Lipoma of arm 07/13/2012   Lipoma of arm 10/07/2012   Left    Mass of arm 07/13/2012   Has had them in past     Personal history of radiation therapy    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   Post-menopausal bleeding 10/08/2011   Reflux 04/17/2012   Renal insufficiency 01/11/2013   Thrombocytopenia (HCC) 09/27/2011   Past Surgical History:  Procedure Laterality Date   APPENDECTOMY  1986   BREAST LUMPECTOMY Right 10/2018   BREAST LUMPECTOMY WITH RADIOACTIVE SEED AND SENTINEL LYMPH NODE BIOPSY Right 10/24/2018   Procedure: RIGHT BREAST LUMPECTOMY WITH RADIOACTIVE SEED AND RIGHT AXILLARY SENTINEL LYMPH NODE BIOPSY, RIGHT BLUE DYE INJECTION;  Surgeon: Gail Favorite, MD;  Location:  [facility];  Service: General;  Laterality: Right;  PEC BLOCK   CESAREAN SECTION     CHOLECYSTECTOMY     COLONOSCOPY  2005   (records here)[place] - diverticulosis and hemorrhoids   CORNEAL TRANSPLANT  2013   CYSTOSCOPY WITH RETROGRADE PYELOGRAM, URETEROSCOPY AND STENT PLACEMENT Right 10/22/2013   Procedure: CYSTOSCOPY WITH RETROGRADE PYELOGRAM, URETEROSCOPY ;  Surgeon: Gretel Ferrara, MD;  Location: [facility];  Service: Urology;  Laterality: Right;   LIPOMA EXCISION  2014   x 3, both arms   TONSILLECTOMY     possible adenoids   TOTAL ABDOMINAL HYSTERECTOMY  1991   for fibroids   TUBAL LIGATION     WRIST FRACTURE SURGERY     right, titanium plate and pins   Patient Active Problem List   Diagnosis Date Noted   Benign paroxysmal positional vertigo 02/13/2023   Transient weakness of right lower extremity 02/05/2023   Hamstring strain, right, initial encounter 12/19/2022   Lesion of lumbar spine 10/15/2022   Seborrheic keratosis 10/10/2022   Closed fracture of base of fifth metatarsal bone with routine healing, right 05/26/2021   Ocular migraine 05/26/2021   Visual changes 03/22/2021   Anticipatory grieving 03/22/2021   Deficiency  of vitamin B12 02/08/2021   Weakness of hip 01/17/2021   Hearing loss due to cerumen impaction, right 01/18/2020   Atrophic vaginitis 01/18/2020   Osteoporosis 03/10/2019   Genetic testing 11/06/2018   Tick bite 11/03/2018   Family history of breast cancer    Family history of colon cancer    Family history of stomach cancer    Family history of brain cancer    Family history of kidney cancer    Family history of oral cancer    Family history of cancer    Malignant neoplasm of upper-inner quadrant of right breast in female, estrogen receptor positive (HCC) 09/29/2018   Plantar fasciitis, right 08/15/2018   Right buttock pain 11/07/2015   Myalgia 11/07/2015   Exercise counseling 03/01/2015   Dysfunction of right rotator cuff 12/14/2014    Skin tag 12/14/2014   Tongue lesion 06/14/2014   Primary osteoarthritis of both knees 05/17/2014   Annual physical exam 08/20/2013   Benign essential hypertension 08/20/2013   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   Reflux 04/17/2012   Post-menopausal bleeding 10/08/2011   Thrombocytopenia (HCC) 09/27/2011   Hematuria 08/24/2011   Trigeminal neuralgia 02/06/2011   Fuchs' corneal dystrophy 11/28/2010   Radiculitis of left cervical region 04/06/2009   Generalized anxiety disorder 05/31/2008    PCP: Debby Petties, MD REFERRING PROVIDER: Debby Petties, MD REFERRING DIAG: H81.13 (ICD-10-CM) - Benign paroxysmal positional vertigo due to bilateral vestibular disorder  THERAPY DIAG:  Other abnormalities of gait and mobility  Unsteadiness on feet  ONSET DATE: 03/27/23  Rationale for Evaluation and Treatment: Rehabilitation  SUBJECTIVE:   SUBJECTIVE STATEMENT: The patient reports that she has the most difficulty with the gaze adaptation exercise. It makes my head nauseous. She describes it feels counterintuitive--- it's like she's training her eyes to go against the monovision to fixate on the target. We discussed this  EVAL:The patient reports sudden onset of bed spins earlier in December. She reports she wants to learn the Epley maneuver. She notes the vertigo has completely stopped. I want to know how to do it if it starts again. She also reports her balance is not good any more. She wants us  to focus on balance and learn some exercises to add to her current routine. She has a h/o R hamstring inflammation. 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   PERTINENT HISTORY: ocular migraine, vitamin D  deficiency, 2020 breast cancer  PAIN:  Are you having pain? No  PRECAUTIONS: None  WEIGHT BEARING RESTRICTIONS: No  FALLS: Has patient fallen in last 6 months? No  PATIENT GOALS: improve balance, learn  self Epley  OBJECTIVE:  Note: Objective measures were completed at Evaluation unless otherwise noted. COGNITION: Overall cognitive status: Within functional limits for tasks assessed  VESTIBULAR ASSESSMENT:  GENERAL OBSERVATION: Patient walks independently at good pace entering clinic   SYMPTOM BEHAVIOR:  Subjective history: 3 day episode of spinning in bed upon waking, improved. Currently notes instability worse in the morning.  Non-Vestibular symptoms: changes in vision and corneal transplants/ mono vision-- distance in one eye and near in one eye (notes sometimes gets images merging together  Type of dizziness: Spinning/Vertigo and Unsteady with head/body turns  Frequency: unsteadiness daily  Duration: seconds  Aggravating factors: Worse in the morning and positional symptoms have improved  Relieving factors:  imrpoved since onset  OCULOMOTOR EXAM:  Ocular Alignment:  L eye adducted   Ocular ROM:  No Limitations  Spontaneous Nystagmus: absent  Gaze-Induced Nystagmus: absent  Smooth Pursuits: intact  Saccades: intact   VESTIBULAR - OCULAR REFLEX:   Slow VOR: Comment: L eye adducts, creates double vision (* L eye ocular migraine-- not on R at all/  has had 2 episodes)  VOR Cancellation: Normal  Head-Impulse Test: HIT Right: positive HIT Left: positive    POSITIONAL TESTING: Right Dix-Hallpike: no nystagmus Left Dix-Hallpike: no nystagmus Right Roll Test: no nystagmus Left Roll Test: no nystagmus  BALANCE:  Foam with eyes closed with significant sway. Tandem stance in corner able to do x 15 seconds bilaterally without UE support.  [facility] Adult PT Treatment:                                                DATE: 04/24/23 Neuromuscular re-ed: Gaze adaptation Tried to progress gaze to standing at a greater distance due to reports of difficulty with HEP Patient continues with some challenge maintaining fixation and gets a nauseous feeling D/c'd VOR for home at this time--PT to  further investigate monovision contributing to visual issues with this activity Habituation Head motion horizontal and vertical planes standing Balance Single leg standing dec'ing UE support The diver (does for hamstring on R LE)-- recommended she not put foot down in between  Foam standing with eyes open/eyes closed narrowing base of support--- recommended she slowly work with HEP to bring feet closer near wall for support Self Care: Self epley's maneuver demonstrated/ discussed how to test and establish which side to treat PT provided handouts   [facility] Adult PT Treatment:                                                DATE: 04/12/23 NMR: Balance training in corner on foam with eyes closed Corner standing with head motion horizontal and vertical Corner standing with tandem stance Single leg stance for HEP Gaze adaptation x 1 viewing seated in horizontal plane  PATIENT EDUCATION: Education details: plan of care, nature of BPPV, balance rationale and HEP Person educated: Patient Education method: Explanation, Demonstration, and Handouts Education comprehension: verbalized understanding, returned demonstration, and needs further education  HOME EXERCISE PROGRAM: Access Code: [email or web address] URL: [email or web address] Date: 04/24/2023 Prepared by: Tawni Ferrier  Program Notes TO TEST WHICH SIDE: You will lay back to the right and see if you are dizzy, and then try the left. You would treat the side that is dizzy. So if head turn to the right brings on dizziness, treat with right epley's.   Exercises - Standing balance with eyes closed on a pillow  - 1 x daily - 7 x weekly - 1 sets - 3 reps - 30 seconds hold - Standing Single Leg Stance with Counter Support  - 1 x daily - 7 x weekly - 1 sets - 3 reps - 10-15 seconds hold - Self-Epley Maneuver Right Ear  - 1 x daily - 7 x weekly - 1 sets - 1-2 reps - Self-Epley Maneuver Left Ear  - 1 x daily - 7 x weekly - 1 sets - 1-2  reps  GOALS: Goals reviewed with patient? Yes  SHORT TERM GOALS: Target date: 05/12/23  The patient will be indep with HEP. Baseline:  initiated at eval Goal status: MET  2.  The patient will verbalize understanding of self epley's. Baseline: to instruct Goal status: MET  LONG TERM GOALS: Target date: 06/10/22  The patient will be indep with progression of HEP. Baseline:  initiated at eval Goal status: INITIAL  2.  The patient will tolerate foam with eyes closed x 30 seconds. Baseline:  Holds 10 seconds with increased sway. Goal status: MET.   3.  The patient will demo single leg standing > 5 seconds bilateral Les. Baseline:  to assess Goal status: INITIAL  ASSESSMENT:  CLINICAL IMPRESSION: The patient has met 2/2 STGs and 1 LTG.. She can do standing eyes closed on foam x 30 seconds with feet apart. PT is holding gaze exercise at this time --- will check with her eye doctor on expectations with mono vision.   EVAL: Patient is a 73 y.o. female who was seen today for physical therapy evaluation and treatment for h/o BPPV and imbalance. She presents with vertigo resolved since onset earlier in December with no nystagmus with positional testing. Patient notes imbalance upon rising in the morning and general unsteadiness. PT to address deficits to teach HEP and self epley maneuver.    OBJECTIVE IMPAIRMENTS: decreased balance and dizziness.   PLAN:  PT FREQUENCY: 1x/week  PT DURATION: 8 weeks  PLANNED INTERVENTIONS: 97164- PT Re-evaluation, 97110-Therapeutic exercises, 97530- Therapeutic activity, 97112- Neuromuscular re-education, 97535- Self Care, [id number]- Manual therapy, [id number]- Gait training, [phone]- Canalith repositioning, and Vestibular training  PLAN FOR NEXT SESSION: check with eye doctor on gaze, add further balance activities (emphasize with current handouts-- has the diver), and assess single leg standing.  Cheyna Retana, PT 04/24/2023, 2:43 PM

## 2023-05-22 ENCOUNTER — Ambulatory Visit: Payer: Medicare Other | Attending: Sports Medicine | Admitting: Rehabilitative and Restorative Service Providers"

## 2023-05-22 ENCOUNTER — Encounter: Payer: Self-pay | Admitting: Rehabilitative and Restorative Service Providers"

## 2023-05-22 DIAGNOSIS — R2689 Other abnormalities of gait and mobility: Secondary | ICD-10-CM | POA: Insufficient documentation

## 2023-05-22 DIAGNOSIS — R2681 Unsteadiness on feet: Secondary | ICD-10-CM | POA: Diagnosis present

## 2023-05-22 NOTE — Therapy (Addendum)
 OUTPATIENT PHYSICAL THERAPY VESTIBULAR TREATMENT and ON HOLD NOTE/ DISCHARGE SUMMARY   Patient Name: Joan Lopez MRN: [id number] DOB:January 14, 1951, 73 y.o., female Today's Date: 05/22/2023   PHYSICAL THERAPY DISCHARGE SUMMARY  Visits from Start of Care: 3  Current functional level related to goals / functional outcomes: Met-- see below.   Remaining deficits: See note for patient status   Education / Equipment: HEP   Patient agrees to discharge. Patient goals were met. Patient is being discharged due to meeting the stated rehab goals.   END OF SESSION:  PT End of Session - 05/22/23 0929     Visit Number 3    Number of Visits 8    Date for PT Re-Evaluation 06/11/23    Authorization Type medicare/ BCBS    PT Start Time [phone]    PT Stop Time 1005    PT Time Calculation (min) 34 min    Activity Tolerance Patient tolerated treatment well    Behavior During Therapy [facility] for tasks assessed/performed             Past Medical History:  Diagnosis Date   Abnormal LFTs 10/29/2012   Acquired bilateral renal cysts    Arthritis    Bilateral bunions    Constipation 10/16/2011   DDD (degenerative disc disease)    spine w narrowing   Dehydration, mild 12/26/2011   Diverticulitis    Exercise counseling 11/14/2011   Family history of brain cancer    Family history of breast cancer    Family history of cancer    Family history of colon cancer    Family history of kidney cancer    Family history of oral cancer    Family history of stomach cancer    Fuch's endothelial dystrophy    Guttate psoriasis    Hematuria 08/24/2011   Hemorrhoids    History of chicken pox    Hyperlipidemia    Kidney stones    Lipoma of arm 07/13/2012   Lipoma of arm 10/07/2012   Left    Mass of arm 07/13/2012   Has had them in past     Personal history of radiation therapy    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   Post-menopausal bleeding 10/08/2011   Reflux 04/17/2012   Renal insufficiency 01/11/2013    Thrombocytopenia (HCC) 09/27/2011   Past Surgical History:  Procedure Laterality Date   APPENDECTOMY  1986   BREAST LUMPECTOMY Right 10/2018   BREAST LUMPECTOMY WITH RADIOACTIVE SEED AND SENTINEL LYMPH NODE BIOPSY Right 10/24/2018   Procedure: RIGHT BREAST LUMPECTOMY WITH RADIOACTIVE SEED AND RIGHT AXILLARY SENTINEL LYMPH NODE BIOPSY, RIGHT BLUE DYE INJECTION;  Surgeon: Gail Favorite, MD;  Location: [facility];  Service: General;  Laterality: Right;  PEC BLOCK   CESAREAN SECTION     CHOLECYSTECTOMY     COLONOSCOPY  2005   (records here)[place] - diverticulosis and hemorrhoids   CORNEAL TRANSPLANT  2013   CYSTOSCOPY WITH RETROGRADE PYELOGRAM, URETEROSCOPY AND STENT PLACEMENT Right 10/22/2013   Procedure: CYSTOSCOPY WITH RETROGRADE PYELOGRAM, URETEROSCOPY ;  Surgeon: Gretel Ferrara, MD;  Location: [facility];  Service: Urology;  Laterality: Right;   LIPOMA EXCISION  2014   x 3, both arms   TONSILLECTOMY     possible adenoids   TOTAL ABDOMINAL HYSTERECTOMY  1991   for fibroids   TUBAL LIGATION     WRIST FRACTURE SURGERY     right, titanium plate and pins   Patient Active Problem List   Diagnosis Date  Noted   Benign paroxysmal positional vertigo 02/13/2023   Transient weakness of right lower extremity 02/05/2023   Hamstring strain, right, initial encounter 12/19/2022   Lesion of lumbar spine 10/15/2022   Seborrheic keratosis 10/10/2022   Closed fracture of base of fifth metatarsal bone with routine healing, right 05/26/2021   Ocular migraine 05/26/2021   Visual changes 03/22/2021   Anticipatory grieving 03/22/2021   Deficiency of vitamin B12 02/08/2021   Weakness of hip 01/17/2021   Hearing loss due to cerumen impaction, right 01/18/2020   Atrophic vaginitis 01/18/2020   Osteoporosis 03/10/2019   Genetic testing 11/06/2018   Tick bite 11/03/2018   Family history of breast cancer    Family history of colon cancer    Family history of stomach cancer    Family history  of brain cancer    Family history of kidney cancer    Family history of oral cancer    Family history of cancer    Malignant neoplasm of upper-inner quadrant of right breast in female, estrogen receptor positive (HCC) 09/29/2018   Plantar fasciitis, right 08/15/2018   Right buttock pain 11/07/2015   Myalgia 11/07/2015   Exercise counseling 03/01/2015   Dysfunction of right rotator cuff 12/14/2014   Skin tag 12/14/2014   Tongue lesion 06/14/2014   Primary osteoarthritis of both knees 05/17/2014   Annual physical exam 08/20/2013   Benign essential hypertension 08/20/2013   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   Reflux 04/17/2012   Post-menopausal bleeding 10/08/2011   Thrombocytopenia (HCC) 09/27/2011   Hematuria 08/24/2011   Trigeminal neuralgia 02/06/2011   Fuchs' corneal dystrophy 11/28/2010   Radiculitis of left cervical region 04/06/2009   Generalized anxiety disorder 05/31/2008    PCP: Debby Petties, MD REFERRING PROVIDER: Debby Petties, MD REFERRING DIAG: H81.13 (ICD-10-CM) - Benign paroxysmal positional vertigo due to bilateral vestibular disorder  THERAPY DIAG:  Other abnormalities of gait and mobility  Unsteadiness on feet  ONSET DATE: 03/27/23  Rationale for Evaluation and Treatment: Rehabilitation  SUBJECTIVE:   SUBJECTIVE STATEMENT: The patient hasn't done exercises in the past week due to viral illness and her dog passing away. The exercise she is not liking is the diver where she is unable to return to single leg standing. She has been unsteady since the respiratory virus began last week.  EVAL:The patient reports sudden onset of bed spins earlier in December. She reports she wants to learn the Epley maneuver. She notes the vertigo has completely stopped. I want to know how to do it if it starts again. She also reports her balance is not good any more. She wants us  to focus on balance and learn some exercises to add to her current routine. She has  a h/o R hamstring inflammation. 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   PERTINENT HISTORY: ocular migraine, vitamin D  deficiency, 2020 breast cancer  PAIN:  Are you having pain? No  PRECAUTIONS: None  WEIGHT BEARING RESTRICTIONS: No  FALLS: Has patient fallen in last 6 months? No  PATIENT GOALS: improve balance, learn self Epley  OBJECTIVE:  Note: Objective measures were completed at Evaluation unless otherwise noted. COGNITION: Overall cognitive status: Within functional limits for tasks assessed  VESTIBULAR ASSESSMENT:  GENERAL OBSERVATION: Patient walks independently at good pace entering clinic   SYMPTOM BEHAVIOR:  Subjective history: 3 day episode of spinning in bed upon waking, improved. Currently notes instability worse in the morning.  Non-Vestibular symptoms: changes in vision and corneal transplants/ mono vision-- distance in one eye and near in one eye (notes sometimes gets images merging together  Type of dizziness: Spinning/Vertigo and Unsteady with head/body turns  Frequency: unsteadiness daily  Duration: seconds  Aggravating factors: Worse in the morning and positional symptoms have improved  Relieving factors:  imrpoved since onset  OCULOMOTOR EXAM:  Ocular Alignment:  L eye adducted   Ocular ROM: No Limitations  Spontaneous Nystagmus: absent  Gaze-Induced Nystagmus: absent  Smooth Pursuits: intact  Saccades: intact   VESTIBULAR - OCULAR REFLEX:   Slow VOR: Comment: L eye adducts, creates double vision (* L eye ocular migraine-- not on R at all/  has had 2 episodes)  VOR Cancellation: Normal  Head-Impulse Test: HIT Right: positive HIT Left: positive    POSITIONAL TESTING: Right Dix-Hallpike: no nystagmus Left Dix-Hallpike: no nystagmus Right Roll Test: no nystagmus Left Roll Test: no nystagmus  BALANCE:  Foam with eyes closed with significant sway. Tandem stance in corner able  to do x 15 seconds bilaterally without UE support.  [facility] Adult PT Treatment:                                                DATE: 05/22/2023 Neuromuscular re-ed: Single leg standing x 15 seconds R and L sides The diver working on full ROM and then also for dynamic balance activities Foam standing in corner  with eyes open and eyes closed x 30 seconds Tandem standing in the corner with eyes open Self Care: Discussed intent of each exercise provided and when she can discontinue Discussed current training and need to maintain running status  [facility] Adult PT Treatment:                                                DATE: 04/24/23 Neuromuscular re-ed: Gaze adaptation Tried to progress gaze to standing at a greater distance due to reports of difficulty with HEP Patient continues with some challenge maintaining fixation and gets a nauseous feeling D/c'd VOR for home at this time--PT to further investigate monovision contributing to visual issues with this activity Habituation Head motion horizontal and vertical planes standing Balance Single leg standing dec'ing UE support The diver (does for hamstring on R LE)-- recommended she not put foot down in between  Foam standing with eyes open/eyes closed narrowing base of support--- recommended she slowly work with HEP to bring feet closer near wall for support Self Care: Self epley's maneuver demonstrated/ discussed how to test and establish which side to treat PT provided handouts   [facility] Adult PT Treatment:                                                DATE: 04/12/23 NMR: Balance training in corner on foam with eyes closed Corner standing with head motion horizontal and vertical Corner standing with tandem stance Single leg stance for HEP Gaze adaptation x 1 viewing seated in horizontal plane  PATIENT EDUCATION: Education details: plan of care, nature of BPPV, balance rationale and HEP Person educated: Patient Education method:  Explanation,  Demonstration, and Handouts Education comprehension: verbalized understanding, returned demonstration, and needs further education  HOME EXERCISE PROGRAM: Access Code: [email or web address] URL: [email or web address] Date: 05/22/2023 Prepared by: Tawni Ferrier  Program Notes TO TEST WHICH SIDE: You will lay back to the right and see if you are dizzy, and then try the left. You would treat the side that is dizzy. So if head turn to the right brings on dizziness, treat with right epley's.   Exercises - Standing balance with eyes closed on a pillow  - 1 x daily - 4-5 x weekly - 1 sets - 3 reps - 30 seconds hold - Standing Single Leg Stance with Counter Support  - 1 x daily - 4-5 x weekly - 1 sets - 3 reps - 10-15 seconds hold - The Diver  - 1 x daily - 4-5 x weekly - 1 sets - 5 reps - Self-Epley Maneuver Right Ear  - 1 x daily - 1 x weekly - 1 sets - 1-2 reps - Self-Epley Maneuver Left Ear  - 1 x daily - 1 x weekly - 1 sets - 1-2 reps  GOALS: Goals reviewed with patient? Yes  SHORT TERM GOALS: Target date: 05/12/23  The patient will be indep with HEP. Baseline: initiated at eval Goal status: MET  2.  The patient will verbalize understanding of self epley's. Baseline: to instruct Goal status: MET  LONG TERM GOALS: Target date: 06/10/22  The patient will be indep with progression of HEP. Baseline:  initiated at eval Goal status: MET  2.  The patient will tolerate foam with eyes closed x 30 seconds. Baseline:  Holds 10 seconds with increased sway. Goal status: MET.   3.  The patient will demo single leg standing > 5 seconds bilateral Les. Baseline:  to assess Goal status: MET demo'ing 15 seconds bilateral sides  ASSESSMENT:  CLINICAL IMPRESSION: The patient has met 3/3 LTGs and 2/2 STGs. She is able to return demo HEP and has self Epley's maneuver. Holding chart x 30 days due to recurrent nature of symptoms. Will d/c in 30 days if she does not return.   EVAL: Patient is  a 73 y.o. female who was seen today for physical therapy evaluation and treatment for h/o BPPV and imbalance. She presents with vertigo resolved since onset earlier in December with no nystagmus with positional testing. Patient notes imbalance upon rising in the morning and general unsteadiness. PT to address deficits to teach HEP and self epley maneuver.    OBJECTIVE IMPAIRMENTS: decreased balance and dizziness.   PLAN:  PT FREQUENCY: 1x/week  PT DURATION: 8 weeks  PLANNED INTERVENTIONS: 97164- PT Re-evaluation, 97110-Therapeutic exercises, 97530- Therapeutic activity, 97112- Neuromuscular re-education, 97535- Self Care, [id number]- Manual therapy, [id number]- Gait training, [phone]- Canalith repositioning, and Vestibular training  PLAN FOR NEXT SESSION: discharge in 30 days (or for renewal if returns).  Melanni Benway, PT 05/22/2023, 10:14 AM

## 2023-05-23 ENCOUNTER — Ambulatory Visit
Admission: RE | Admit: 2023-05-23 | Discharge: 2023-05-23 | Disposition: A | Discharge status: Home / Self Care | Payer: Medicare Other | Source: Ambulatory Visit | Attending: Hematology and Oncology | Admitting: Hematology and Oncology

## 2023-05-23 DIAGNOSIS — Z170 Estrogen receptor positive status [ER+]: Secondary | ICD-10-CM

## 2023-05-23 DIAGNOSIS — M899 Disorder of bone, unspecified: Secondary | ICD-10-CM

## 2023-05-23 DIAGNOSIS — R937 Abnormal findings on diagnostic imaging of other parts of musculoskeletal system: Secondary | ICD-10-CM | POA: Diagnosis not present

## 2023-05-23 MED ORDER — GADOPICLENOL 0.5 MMOL/ML IV SOLN
5.0000 mL | Freq: Once | INTRAVENOUS | Status: AC | PRN
  Administered 2023-05-23: 5 mL via INTRAVENOUS

## 2023-05-24 ENCOUNTER — Ambulatory Visit (INDEPENDENT_AMBULATORY_CARE_PROVIDER_SITE_OTHER): Payer: Medicare Other | Admitting: Sports Medicine

## 2023-05-24 ENCOUNTER — Other Ambulatory Visit: Payer: Medicare Other

## 2023-05-24 ENCOUNTER — Ambulatory Visit (INDEPENDENT_AMBULATORY_CARE_PROVIDER_SITE_OTHER): Payer: Medicare Other

## 2023-05-24 VITALS — BP 108/71 | HR 97

## 2023-05-24 DIAGNOSIS — R059 Cough, unspecified: Secondary | ICD-10-CM

## 2023-05-24 DIAGNOSIS — J209 Acute bronchitis, unspecified: Secondary | ICD-10-CM | POA: Diagnosis not present

## 2023-05-24 DIAGNOSIS — R0689 Other abnormalities of breathing: Secondary | ICD-10-CM | POA: Diagnosis not present

## 2023-05-24 DIAGNOSIS — R0989 Other specified symptoms and signs involving the circulatory and respiratory systems: Secondary | ICD-10-CM | POA: Diagnosis not present

## 2023-05-24 MED ORDER — PREDNISONE 50 MG PO TABS: 50.0000 mg | ORAL_TABLET | Freq: Every day | ORAL | 0 refills | Status: DC

## 2023-05-24 MED ORDER — AZITHROMYCIN 250 MG PO TABS: ORAL_TABLET | ORAL | 0 refills | Status: DC

## 2023-05-24 MED ORDER — HYDROCOD POLI-CHLORPHE POLI ER 10-8 MG/5ML PO SUER: 5.0000 mL | Freq: Two times a day (BID) | ORAL | 0 refills | Status: DC | PRN

## 2023-05-24 NOTE — Progress Notes (Signed)
    Procedures performed today:    None.  Independent interpretation of notes and tests performed by another provider:   None.  Brief History, Exam, Impression, and Recommendations:    Acute bronchitis This is a very pleasant 73 year old female, she has had about a week of a productive cough, keeping her up at night, chills, malaise, mild runny nose. No GI symptoms, no shortness of breath, no headache. She did test negative for COVID recently. She appears well on exam, she does have some nasal erythema, she also has coarse sounds right upper lobe. Otherwise no nasal flaring, accessory muscle use, she is able to speak full sentences. Considering duration of symptoms, her age, and history of breast cancer we will treat her aggressively, 5 days of prednisone , azithromycin , Tussionex, chest x-ray.    ____________________________________________ Debby PARAS. Curtis, M.D., ABFM., CAQSM., AME. Primary Care and Sports Medicine [facility]  Adjunct Professor of [facility] Medicine  [employer]  Restaurant Manager, [employer]

## 2023-05-24 NOTE — Assessment & Plan Note (Signed)
 This is a very pleasant 73 year old female, she has had about a week of a productive cough, keeping her up at night, chills, malaise, mild runny nose. No GI symptoms, no shortness of breath, no headache. She did test negative for COVID recently. She appears well on exam, she does have some nasal erythema, she also has coarse sounds right upper lobe. Otherwise no nasal flaring, accessory muscle use, she is able to speak full sentences. Considering duration of symptoms, her age, and history of breast cancer we will treat her aggressively, 5 days of prednisone , azithromycin , Tussionex, chest x-ray.

## 2023-05-27 NOTE — Assessment & Plan Note (Signed)
 09/26/2018: Right UOQ T1BN0 stage Ia grade 1 IDC ER/PR positive HER2 negative Ki-67 5% 10/24/2018: Right lumpectomy: T1BN0 stage Ia grade 1 IDC negative margins 0/3 lymph nodes negative 12/01/2018-12/29/2018: Adjuvant radiation October 2020: Tamoxifen  started Genetics: No mutations   Tamoxifen  toxicities: 1.  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  She is straining for a marathon.   Breast cancer surveillance: 1.  Breast exam 05/28/2023: Benign 2. Mammogram 08/21/2022: Benign breast density category B 3.  MRI thoracic spine and lumbar spine: Small lesions at T4, T11 and L2 nonspecific 4.  PET/CT scan 11/19/2022: No hypermetabolic activity noted in the spine.   MRI spine 05/23/2023:

## 2023-05-28 ENCOUNTER — Telehealth: Payer: Self-pay | Admitting: Hematology and Oncology

## 2023-05-28 ENCOUNTER — Encounter: Payer: Self-pay | Admitting: *Deleted

## 2023-05-28 ENCOUNTER — Inpatient Hospital Stay: Payer: Medicare Other | Attending: Hematology and Oncology | Admitting: Hematology and Oncology

## 2023-05-28 DIAGNOSIS — Z170 Estrogen receptor positive status [ER+]: Secondary | ICD-10-CM

## 2023-05-28 DIAGNOSIS — C50211 Malignant neoplasm of upper-inner quadrant of right female breast: Secondary | ICD-10-CM | POA: Diagnosis not present

## 2023-05-28 DIAGNOSIS — M899 Disorder of bone, unspecified: Secondary | ICD-10-CM

## 2023-05-28 NOTE — Progress Notes (Signed)
Per MD request RN successfully faxed BCI request to [phone].

## 2023-05-28 NOTE — Telephone Encounter (Signed)
Breast cancer index performed on 09/17/2022 showed no benefit for extended endocrine therapy. She will complete 5 years of tamoxifen by October 2025.  She will discontinue it at that time.

## 2023-05-28 NOTE — Progress Notes (Signed)
HEMATOLOGY-ONCOLOGY TELEPHONE VISIT PROGRESS NOTE  I connected with our patient on 05/28/23 at  9:00 AM EST by telephone and verified that I am speaking with the correct person using two identifiers.  I discussed the limitations, risks, security and privacy concerns of performing an evaluation and management service by telephone and the availability of in person appointments.  I also discussed with the patient that there may be a patient responsible charge related to this service. The patient expressed understanding and agreed to proceed.   History of Present Illness:    History of Present Illness   Joan Lopez is a 73 year old female who presents for follow-up MRI results.  Previous PET scans indicated no activity in these areas. The MRI's sensitivity suggests possible minor bone inflammation or hemangiomas.  She is approaching five years since her initial treatment and is contemplating testing to determine the continuation of tamoxifen therapy. This will be a topic for future discussions.  In her social history, she remains active, having recently completed a half marathon in [place] with friends.        Oncology History  Malignant neoplasm of upper-inner quadrant of right breast in female, estrogen receptor positive (HCC)  09/29/2018 Initial Diagnosis   Malignant neoplasm of upper-inner quadrant of right breast in female, estrogen receptor positive (HCC)   10/08/2018 Cancer Staging   Staging form: Breast, AJCC 8th Edition - Clinical: Stage IA (cT1b, cN0, cM0, G1, ER+, PR+, HER2-) - Signed by Lowella Dell, MD on 05/11/2020   10/30/2018 Cancer Staging   Staging form: Breast, AJCC 8th Edition - Pathologic stage from 10/30/2018: Stage IA (pT1b, pN0, cM0, G1, ER+, PR+, HER2-) - Signed by Loa Socks, NP on 11/05/2018   11/06/2018 Genetic Testing   Negative genetic testing on the [facility] Multi-Cancer panel.  The report date is 11/06/2018.  The Multi-Gene Panel  offered by [facility] includes sequencing and/or deletion duplication testing of the following 85 genes: AIP, ALK, APC, ATM, AXIN2,BAP1,  BARD1, BLM, BMPR1A, BRCA1, BRCA2, BRIP1, CASR, CDC73, CDH1, CDK4, CDKN1B, CDKN1C, CDKN2A (p14ARF), CDKN2A (p16INK4a), CEBPA, CHEK2, CTNNA1, DICER1, DIS3L2, EGFR (c.2369C>T, p.Thr790Met variant only), EPCAM (Deletion/duplication testing only), FH, FLCN, GATA2, GPC3, GREM1 (Promoter region deletion/duplication testing only), HOXB13 (c.251G>A, p.Gly84Glu), HRAS, KIT, MAX, MEN1, MET, MITF (c.952G>A, p.Glu318Lys variant only), MLH1, MSH2, MSH3, MSH6, MUTYH, NBN, NF1, NF2, NTHL1, PALB2, PDGFRA, PHOX2B, PMS2, POLD1, POLE, POT1, PRKAR1A, PTCH1, PTEN, RAD50, RAD51C, RAD51D, RB1, RECQL4, RET, RNF43, RUNX1, SDHAF2, SDHA (sequence changes only), SDHB, SDHC, SDHD, SMAD4, SMARCA4, SMARCB1, SMARCE1, STK11, SUFU, TERC, TERT, TMEM127, TP53, TSC1, TSC2, VHL, WRN and WT1.       REVIEW OF SYSTEMS:   Constitutional: Denies fevers, chills or abnormal weight loss All other systems were reviewed with the patient and are negative. Observations/Objective:     Assessment Plan:  Malignant neoplasm of upper-inner quadrant of right breast in female, estrogen receptor positive (HCC) 09/26/2018: Right UOQ T1BN0 stage Ia grade 1 IDC ER/PR positive HER2 negative Ki-67 5% 10/24/2018: Right lumpectomy: T1BN0 stage Ia grade 1 IDC negative margins 0/3 lymph nodes negative 12/01/2018-12/29/2018: Adjuvant radiation October 2020: Tamoxifen started Genetics: No mutations   Tamoxifen toxicities: 1.  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  She ran a half marathon recently.   Breast cancer surveillance: 1.  Breast exam 05/28/2023: Benign 2. Mammogram 08/21/2022: Benign breast density category B 3.  MRI thoracic spine  and lumbar spine: Small lesions at T4, T11 and L2 nonspecific 4.  PET/CT scan 11/19/2022: No hypermetabolic  activity noted in the spine. 5.  MRI spine 05/23/2023: Persistent T2 enhancement T11 and L2.  Unchanged from before differential between hemangioma versus metastatic disease.  Repeat MRI in 6 months and follow-up after that. We will request a BCI testing to determine if she would benefit from extended endocrine therapy.     I discussed the assessment and treatment plan with the patient. The patient was provided an opportunity to ask questions and all were answered. The patient agreed with the plan and demonstrated an understanding of the instructions. The patient was advised to call back or seek an in-person evaluation if the symptoms worsen or if the condition fails to improve as anticipated.   I provided 20 minutes of non-face-to-face time during this encounter.  This includes time for charting and coordination of care   Tamsen Meek, MD

## 2023-06-13 ENCOUNTER — Encounter: Payer: Self-pay | Admitting: Sports Medicine

## 2023-06-19 ENCOUNTER — Telehealth: Payer: Self-pay | Admitting: Hematology and Oncology

## 2023-06-19 ENCOUNTER — Other Ambulatory Visit: Payer: Self-pay | Admitting: Hematology and Oncology

## 2023-06-19 DIAGNOSIS — Z1231 Encounter for screening mammogram for malignant neoplasm of breast: Secondary | ICD-10-CM

## 2023-06-19 NOTE — Telephone Encounter (Signed)
Rescheduled appointments per los. Patient is aware of the changes made to her upcoming appointments.

## 2023-06-26 ENCOUNTER — Other Ambulatory Visit: Payer: Self-pay | Admitting: Hematology and Oncology

## 2023-07-05 ENCOUNTER — Ambulatory Visit: Admitting: Sports Medicine

## 2023-07-05 DIAGNOSIS — S61451A Open bite of right hand, initial encounter: Secondary | ICD-10-CM

## 2023-07-05 DIAGNOSIS — Z23 Encounter for immunization: Secondary | ICD-10-CM | POA: Diagnosis not present

## 2023-07-05 DIAGNOSIS — W540XXA Bitten by dog, initial encounter: Secondary | ICD-10-CM

## 2023-07-05 MED ORDER — DOXYCYCLINE HYCLATE 100 MG PO TABS: 100.0000 mg | ORAL_TABLET | Freq: Two times a day (BID) | ORAL | 0 refills | Status: AC

## 2023-07-05 NOTE — Progress Notes (Signed)
    Procedures performed today:    None.  Independent interpretation of notes and tests performed by another provider:   None.  Brief History, Exam, Impression, and Recommendations:    Dog bite, hand, right, initial encounter Pleasant 73 year old female, accidental dog bite yesterday (neighbors domestic dog). Right hand and wrist. Hemostatic, only into the subcutaneous tissues, mild surrounding erythema. Her last tetanus shot was 2018, per protocol as this is a dirty wound and her last tetanus booster was over 5 years ago we will give her another tetanus booster today, adding doxycycline Return to see me as needed.    ____________________________________________ Ihor Austin. Benjamin Stain, M.D., ABFM., CAQSM., AME. Primary Care and Sports Medicine [facility]  Adjunct Professor of Family Medicine  [place] [employer]  Restaurant manager, fast food

## 2023-07-05 NOTE — Assessment & Plan Note (Addendum)
 Pleasant 73 year old female, accidental dog bite yesterday (neighbors domestic dog). Right hand and wrist. Hemostatic, only into the subcutaneous tissues, mild surrounding erythema. Her last tetanus shot was 2018, per protocol as this is a dirty wound and her last tetanus booster was over 5 years ago we will give her another tetanus booster today, adding doxycycline Return to see me as needed.

## 2023-08-08 ENCOUNTER — Ambulatory Visit (INDEPENDENT_AMBULATORY_CARE_PROVIDER_SITE_OTHER): Admitting: Sports Medicine

## 2023-08-08 DIAGNOSIS — K148 Other diseases of tongue: Secondary | ICD-10-CM

## 2023-08-08 NOTE — Assessment & Plan Note (Signed)
 This very pleasant 73 year old female who does not use tobacco products does again complain of a dome-shaped fleshy lesion right side of the tongue. Initially thought to be from repetitive trauma, I initially suspected mucocele approximately 9 years ago. Today she brings it back up, it is nontender, it does not bleed. I again suspect this is a benign mucocele, we will get ENT to weigh in but I advised her this likely needed no treatment.

## 2023-08-08 NOTE — Progress Notes (Signed)
    Procedures performed today:    None.  Independent interpretation of notes and tests performed by another provider:   None.  Brief History, Exam, Impression, and Recommendations:    Mucocele of tongue This very pleasant 73 year old female who does not use tobacco products does again complain of a dome-shaped fleshy lesion right side of the tongue. Initially thought to be from repetitive trauma, I initially suspected mucocele approximately 9 years ago. Today she brings it back up, it is nontender, it does not bleed. I again suspect this is a benign mucocele, we will get ENT to weigh in but I advised her this likely needed no treatment.    ____________________________________________ Joselyn Nicely. Sandy Crumb, M.D., ABFM., CAQSM., AME. Primary Care and Sports Medicine [facility]  Adjunct Professor of [facility] Medicine  [employer]  Restaurant manager, fast food

## 2023-08-13 ENCOUNTER — Ambulatory Visit: Admitting: Sports Medicine

## 2023-08-22 ENCOUNTER — Ambulatory Visit
Admission: RE | Admit: 2023-08-22 | Discharge: 2023-08-22 | Disposition: A | Discharge status: Home / Self Care | Source: Ambulatory Visit | Attending: Hematology and Oncology | Admitting: Hematology and Oncology

## 2023-08-22 DIAGNOSIS — Z1231 Encounter for screening mammogram for malignant neoplasm of breast: Secondary | ICD-10-CM

## 2023-08-23 ENCOUNTER — Ambulatory Visit

## 2023-09-11 ENCOUNTER — Ambulatory Visit: Payer: Medicare Other | Admitting: Hematology and Oncology

## 2023-09-11 ENCOUNTER — Other Ambulatory Visit: Payer: Medicare Other

## 2023-09-12 ENCOUNTER — Ambulatory Visit

## 2023-09-12 VITALS — Ht 60.5 in | Wt 112.0 lb

## 2023-09-12 DIAGNOSIS — Z0000 Encounter for general adult medical examination without abnormal findings: Secondary | ICD-10-CM | POA: Diagnosis not present

## 2023-09-12 NOTE — Progress Notes (Signed)
 Subjective:   Joan Lopez is a 73 y.o. female who presents for Medicare Annual (Subsequent) preventive examination.  Visit Complete: Virtual I connected with  Joan Lopez on 09/12/23 by a audio enabled telemedicine application and verified that I am speaking with the correct person using two identifiers.  Patient Location: Home  Provider Location: Office/Clinic  I discussed the limitations of evaluation and management by telemedicine. The patient expressed understanding and agreed to proceed.  Vital Signs: Because this visit was a virtual/telehealth visit, some criteria may be missing or patient reported. Any vitals not documented were not able to be obtained and vitals that have been documented are patient reported.  Patient Medicare AWV questionnaire was completed by the patient on 09/09/2023; I have confirmed that all information answered by patient is correct and no changes since this date.  Cardiac Risk Factors include: advanced age (>10men, >48 women)     Objective:    Today's Vitals   09/12/23 0836  Weight: 112 lb (50.8 kg)  Height: 5' 0.5" (1.537 m)   Body mass index is 21.51 kg/m.     09/12/2023    8:46 AM 01/19/2021   11:38 AM 08/27/2019   10:21 AM 05/20/2019   10:36 AM 01/29/2019   10:46 AM 10/24/2018    8:47 AM 10/09/2018    2:48 PM  Advanced Directives  Does Patient Have a Medical Advance Directive? Yes Yes No No No No No  Type of Estate agent of [place];Living will Healthcare Power of [place];Living will       Does patient want to make changes to medical advance directive? No - Patient declined        Copy of Healthcare Power of Attorney in Chart? No - copy requested No - copy requested       Would patient like information on creating a medical advance directive?   No - Patient declined No - Patient declined  No - Patient declined     Current Medications (verified) Outpatient Encounter Medications as of 09/12/2023  Medication Sig    calcium  carbonate (OSCAL) 1500 (600 Ca) MG TABS tablet Take 1 tablet (1,500 mg total) by mouth 2 (two) times daily with a meal.   folic acid  (FOLVITE ) 1 MG tablet Take 1 tablet (1 mg total) by mouth daily.   loteprednol  (LOTEMAX ) 0.5 % ophthalmic suspension Place 1 drop into both eyes as directed. Reported on 06/07/2015   tamoxifen  (NOLVADEX ) 20 MG tablet TAKE 1 TABLET BY MOUTH EVERY DAY   vitamin B-12 (CYANOCOBALAMIN) 1000 MCG tablet Take 1 tablet (1,000 mcg total) by mouth daily.   [DISCONTINUED] azithromycin  (ZITHROMAX  Z-PAK) 250 MG tablet Take 2 tablets (500 mg) on  Day 1,  followed by 1 tablet (250 mg) once daily on Days 2 through 5.   [DISCONTINUED] chlorpheniramine-HYDROcodone  (TUSSIONEX) 10-8 MG/5ML Take 5 mLs by mouth every 12 (twelve) hours as needed for cough (cough, will cause drowsiness.).   [DISCONTINUED] predniSONE  (DELTASONE ) 50 MG tablet Take 1 tablet (50 mg total) by mouth daily.   No facility-administered encounter medications on file as of 09/12/2023.    Allergies (verified) Amoxicillin, Augmentin [amoxicillin-pot clavulanate], and Clavulanic acid   History: Past Medical History:  Diagnosis Date   Abnormal LFTs 10/29/2012   Acquired bilateral renal cysts    Arthritis    Bilateral bunions    Constipation 10/16/2011   DDD (degenerative disc disease)    spine w narrowing   Dehydration, mild 12/26/2011   Diverticulitis  Exercise counseling 11/14/2011   Family history of brain cancer    Family history of breast cancer    Family history of cancer    Family history of colon cancer    Family history of kidney cancer    Family history of oral cancer    Family history of stomach cancer    Fuch's endothelial dystrophy    Guttate psoriasis    Hematuria 08/24/2011   Hemorrhoids    History of chicken pox    Hyperlipidemia    Kidney stones    Lipoma of arm 07/13/2012   Lipoma of arm 10/07/2012   Left    Mass of arm 07/13/2012   Has had them in past     Personal history  of radiation therapy    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   Post-menopausal bleeding 10/08/2011   Reflux 04/17/2012   Renal insufficiency 01/11/2013   Thrombocytopenia (HCC) 09/27/2011   Past Surgical History:  Procedure Laterality Date   APPENDECTOMY  1986   BREAST LUMPECTOMY Right 10/2018   BREAST LUMPECTOMY WITH RADIOACTIVE SEED AND SENTINEL LYMPH NODE BIOPSY Right 10/24/2018   Procedure: RIGHT BREAST LUMPECTOMY WITH RADIOACTIVE SEED AND RIGHT AXILLARY SENTINEL LYMPH NODE BIOPSY, RIGHT BLUE DYE INJECTION;  Surgeon: Boyce Byes, MD;  Location: [facility];  Service: General;  Laterality: Right;  PEC BLOCK   CESAREAN SECTION     CHOLECYSTECTOMY     COLONOSCOPY  2005   (records here)[place] - diverticulosis and hemorrhoids   CORNEAL TRANSPLANT  2013   CYSTOSCOPY WITH RETROGRADE PYELOGRAM, URETEROSCOPY AND STENT PLACEMENT Right 10/22/2013   Procedure: CYSTOSCOPY WITH RETROGRADE PYELOGRAM, URETEROSCOPY ;  Surgeon: Florencio Hunting, MD;  Location: [facility];  Service: Urology;  Laterality: Right;   LIPOMA EXCISION  2014   x 3, both arms   TONSILLECTOMY     possible adenoids   TOTAL ABDOMINAL HYSTERECTOMY  1991   for fibroids   TUBAL LIGATION     WRIST FRACTURE SURGERY     right, titanium plate and pins   Family History  Problem Relation Age of Onset   Stroke Mother    Heart disease Mother    Hypertension Mother    Kidney disease Father    Breast cancer Sister 17   Cancer Sister 76       ductal carcinoma breast- had it in 1999 and just found out its in the opposite breast   Brain cancer Maternal Grandmother 48       brain tumor   Breast cancer Paternal Grandmother 68       post menopausal    Pneumonia Maternal Grandfather    Colon cancer Maternal Aunt 53       27s or 59s   Cancer Paternal Uncle 64       neck tumor   Stomach cancer Maternal Aunt 14       11s   Kidney cancer Cousin 30       30s, maternal first cousin   Cancer Paternal Aunt        blood cancer,  unknown but older age at diagnosis   Cancer Maternal Aunt 38       unknown, but not breast, 80s   Cancer Cousin        oral cancer diagnosed older, maternal first cousin   Esophageal cancer Neg Hx    Pancreatic cancer Neg Hx    Rectal cancer Neg Hx    Social History   Socioeconomic History  Marital status: Married    Spouse name: Not on file   Number of children: Not on file   Years of education: Not on file   Highest education level: Professional school degree (e.g., MD, DDS, DVM, JD)  Occupational History   Not on file  Tobacco Use   Smoking status: Never   Smokeless tobacco: Never  Substance and Sexual Activity   Alcohol use: No   Drug use: No   Sexual activity: Yes  Other Topics Concern   Not on file  Social History Narrative   She live with her husband. She enjoys reading, puzzles and running.    Social Drivers of Corporate investment banker Strain: Low Risk  (09/12/2023)   Overall Financial Resource Strain (CARDIA)    Difficulty of Paying Living Expenses: Not hard at all  Food Insecurity: No Food Insecurity (09/12/2023)   Hunger Vital Sign    Worried About Running Out of Food in the Last Year: Never true    Ran Out of Food in the Last Year: Never true  Transportation Needs: No Transportation Needs (09/12/2023)   PRAPARE - Administrator, Civil Service (Medical): No    Lack of Transportation (Non-Medical): No  Physical Activity: Sufficiently Active (09/12/2023)   Exercise Vital Sign    Days of Exercise per Week: 7 days    Minutes of Exercise per Session: 120 min  Stress: No Stress Concern Present (09/12/2023)   [employer] of Occupational Health - Occupational Stress Questionnaire    Feeling of Stress : Not at all  Social Connections: Moderately Isolated (09/12/2023)   Social Connection and Isolation Panel [NHANES]    Frequency of Communication with Friends and Family: More than three times a week    Frequency of Social Gatherings with Friends  and Family: Three times a week    Attends Religious Services: Never    Active Member of Clubs or Organizations: No    Attends Banker Meetings: Never    Marital Status: Married    Tobacco Counseling Counseling given: Not Answered   Clinical Intake:  Pre-visit preparation completed: Yes  Pain : No/denies pain     BMI - recorded: 21.51 Nutritional Status: BMI of 19-24  Normal Nutritional Risks: Other (Comment) Diabetes: No  How often do you need to have someone help you when you read instructions, pamphlets, or other written materials from your doctor or pharmacy?: 1 - Never What is the last grade level you completed in school?: 18  Interpreter Needed?: No      Activities of Daily Living    09/12/2023    8:37 AM 09/08/2023    9:28 AM  In your present state of health, do you have any difficulty performing the following activities:  Hearing? 0 0  Vision? 0 0  Difficulty concentrating or making decisions? 0 0  Walking or climbing stairs? 0 0  Dressing or bathing? 0 0  Doing errands, shopping? 0 0  Preparing Food and eating ? N N  Using the Toilet? N N  In the past six months, have you accidently leaked urine? N N  Do you have problems with loss of bowel control? N N  Managing your Medications? N N  Managing your Finances? N N  Housekeeping or managing your Housekeeping? N N    Patient Care Team: Gean Keels, MD as PCP - General (Sports Medicine) Semchyshyn, Taras Michael, MD as Consulting Physician (Ophthalmology) Sim Dryer, MD as Consulting Physician (General Surgery)  Kenney Peacemaker, MD as Consulting Physician (Gastroenterology) Florencio Hunting, MD as Consulting Physician (Urology) Cameron Cea, MD as Consulting Physician (Hematology and Oncology)  Indicate any recent Medical Services you may have received from other than [facility] providers in the past year (date may be approximate).     Assessment:   This is a routine wellness  examination for Pearl.  Hearing/Vision screen No results found.   Goals Addressed   None   Depression Screen    09/12/2023    8:43 AM 08/08/2023    3:51 PM 02/13/2023   10:52 AM 10/11/2017   10:23 AM  PHQ 2/9 Scores  PHQ - 2 Score 1 0 0 1    Fall Risk    09/12/2023    8:47 AM 09/08/2023    9:28 AM 02/13/2023   10:51 AM 03/11/2019    5:07 PM 02/28/2018    3:16 PM  Fall Risk   Falls in the past year? 0 0 0 0 0  Comment    Emmi Telephone Survey: data to providers prior to load [employer] Survey: data to providers prior to load  Number falls in past yr: 0 0 0    Injury with Fall? 0  0    Risk for fall due to : No Fall Risks      Follow up Falls evaluation completed  Falls evaluation completed      MEDICARE RISK AT HOME: Medicare Risk at Home Any stairs in or around the home?: Yes If so, are there any without handrails?: No Home free of loose throw rugs in walkways, pet beds, electrical cords, etc?: Yes Adequate lighting in your home to reduce risk of falls?: Yes Life alert?: No Use of a cane, walker or w/c?: No Grab bars in the bathroom?: Yes Shower chair or bench in shower?: Yes Elevated toilet seat or a handicapped toilet?: Yes  TIMED UP AND GO:  Was the test performed?  No    Cognitive Function:        09/12/2023    8:47 AM  6CIT Screen  What Year? 0 points  What month? 0 points  What time? 0 points  Count back from 20 0 points  Months in reverse 0 points  Repeat phrase 0 points  Total Score 0 points    Immunizations Immunization History  Administered Date(s) Administered   PFIZER(Purple Top)SARS-COV-2 Vaccination 05/23/2019, 06/13/2019   Pneumococcal Polysaccharide-23 08/15/2018   Td 04/16/2006   Tdap 08/09/2016, 07/05/2023    TDAP status: Up to date  Flu Vaccine status: Declined, Education has been provided regarding the importance of this vaccine but patient still declined. Advised may receive this vaccine at local pharmacy or Health  Dept. Aware to provide a copy of the vaccination record if obtained from local pharmacy or Health Dept. Verbalized acceptance and understanding.  Pneumococcal vaccine status: Declined,  Education has been provided regarding the importance of this vaccine but patient still declined. Advised may receive this vaccine at local pharmacy or Health Dept. Aware to provide a copy of the vaccination record if obtained from local pharmacy or Health Dept. Verbalized acceptance and understanding.   Covid-19 vaccine status: Declined, Education has been provided regarding the importance of this vaccine but patient still declined. Advised may receive this vaccine at local pharmacy or Health Dept.or vaccine clinic. Aware to provide a copy of the vaccination record if obtained from local pharmacy or Health Dept. Verbalized acceptance and understanding.  Qualifies for Shingles Vaccine? Yes   Zostavax  completed No   Shingrix Completed?: No.    Education has been provided regarding the importance of this vaccine. Patient has been advised to call insurance company to determine out of pocket expense if they have not yet received this vaccine. Advised may also receive vaccine at local pharmacy or Health Dept. Verbalized acceptance and understanding.  Screening Tests Health Maintenance  Topic Date Due   Zoster Vaccines- Shingrix (1 of 2) Never done   COVID-19 Vaccine (3 - Pfizer risk series) 07/11/2019   Pneumonia Vaccine 63+ Years old (2 of 2 - PCV) 08/15/2019   Colonoscopy  09/15/2023   INFLUENZA VACCINE  11/15/2023   Medicare Annual Wellness (AWV)  09/11/2024   MAMMOGRAM  08/21/2025   DTaP/Tdap/Td (4 - Td or Tdap) 07/04/2033   DEXA SCAN  Completed   Hepatitis C Screening  Completed   HPV VACCINES  Aged Out   Meningococcal B Vaccine  Aged Out    Health Maintenance  Health Maintenance Due  Topic Date Due   Zoster Vaccines- Shingrix (1 of 2) Never done   COVID-19 Vaccine (3 - Pfizer risk series) 07/11/2019    Pneumonia Vaccine 54+ Years old (2 of 2 - PCV) 08/15/2019   Colonoscopy  09/15/2023    Colorectal cancer screening: Type of screening: Colonoscopy. Completed 09/14/2013. Repeat every 10 years  Mammogram status: Completed 08/22/2023. Repeat every year  Bone Density status: Completed 05/31/2021. Results reflect: Bone density results: OSTEOPENIA. Repeat every 2 years.  Lung Cancer Screening: (Low Dose CT Chest recommended if Age 62-80 years, 20 pack-year currently smoking OR have quit w/in 15years.) does not qualify.   Lung Cancer Screening Referral: n/a  Additional Screening:  Hepatitis C Screening: does qualify; Completed 12/23/2022  Vision Screening: Recommended annual ophthalmology exams for early detection of glaucoma and other disorders of the eye. Is the patient up to date with their annual eye exam?  Yes  Who is the provider or what is the name of the office in which the patient attends annual eye exams? Dr Rosaland Collie If pt is not established with a provider, would they like to be referred to a provider to establish care? N/a.   Dental Screening: Recommended annual dental exams for proper oral hygiene   Community Resource Referral / Chronic Care Management: CRR required this visit?  No   CCM required this visit?  No     Plan:     I have personally reviewed and noted the following in the patient's chart:   Medical and social history Use of alcohol, tobacco or illicit drugs  Current medications and supplements including opioid prescriptions. Patient is not currently taking opioid prescriptions. Functional ability and status Nutritional status Physical activity Advanced directives List of other physicians Hospitalizations, surgeries, and ER visits in previous 12 months. None Vitals Screenings to include cognitive, depression, and falls Referrals and appointments  In addition, I have reviewed and discussed with patient certain preventive protocols, quality metrics, and  best practice recommendations. A written personalized care plan for preventive services as well as general preventive health recommendations were provided to patient.     Aubrey Leaf, CMA   09/12/2023   After Visit Summary: (MyChart) Due to this being a telephonic visit, the after visit summary with patients personalized plan was offered to patient via MyChart   Nurse Notes:   JORDIE SKALSKY is a 73 y.o. y.o. female patient of Dr Sandy Crumb who had a Medicare Annual Wellness Visit today via telephone. She reports that he is  socially active and does interact with friends/family regularly. She is the caregiver for her husband. She is physically active. She enjoys puzzles and running.

## 2023-09-12 NOTE — Patient Instructions (Signed)
  Joan Lopez , Thank you for taking time to come for your Medicare Wellness Visit. I appreciate your ongoing commitment to your health goals. Please review the following plan we discussed and let me know if I can assist you in the future.   These are the goals we discussed:  Goals   None     This is a list of the screening recommended for you and due dates:  Health Maintenance  Topic Date Due   Zoster (Shingles) Vaccine (1 of 2) Never done   COVID-19 Vaccine (3 - Pfizer risk series) 07/11/2019   Pneumonia Vaccine (2 of 2 - PCV) 08/15/2019   Colon Cancer Screening  09/15/2023   Flu Shot  11/15/2023   Medicare Annual Wellness Visit  09/11/2024   Mammogram  08/21/2025   DTaP/Tdap/Td vaccine (4 - Td or Tdap) 07/04/2033   DEXA scan (bone density measurement)  Completed   Hepatitis C Screening  Completed   HPV Vaccine  Aged Out   Meningitis B Vaccine  Aged Out

## 2023-09-27 ENCOUNTER — Encounter: Payer: Self-pay | Admitting: Sports Medicine

## 2023-10-01 ENCOUNTER — Ambulatory Visit

## 2023-10-01 ENCOUNTER — Ambulatory Visit (INDEPENDENT_AMBULATORY_CARE_PROVIDER_SITE_OTHER): Admitting: Sports Medicine

## 2023-10-01 DIAGNOSIS — M170 Bilateral primary osteoarthritis of knee: Secondary | ICD-10-CM

## 2023-10-01 DIAGNOSIS — M25562 Pain in left knee: Secondary | ICD-10-CM | POA: Diagnosis not present

## 2023-10-01 DIAGNOSIS — M25561 Pain in right knee: Secondary | ICD-10-CM | POA: Diagnosis not present

## 2023-10-01 DIAGNOSIS — M25462 Effusion, left knee: Secondary | ICD-10-CM | POA: Diagnosis not present

## 2023-10-01 DIAGNOSIS — M1712 Unilateral primary osteoarthritis, left knee: Secondary | ICD-10-CM | POA: Diagnosis not present

## 2023-10-01 MED ORDER — ACETAMINOPHEN ER 650 MG PO TBCR: 650.0000 mg | EXTENDED_RELEASE_TABLET | Freq: Three times a day (TID) | ORAL | Status: DC | PRN

## 2023-10-01 MED ORDER — DICLOFENAC SODIUM 1 % EX GEL: 4.0000 g | Freq: Four times a day (QID) | CUTANEOUS | Status: DC

## 2023-10-01 NOTE — Progress Notes (Signed)
    Procedures performed today:    None.  Independent interpretation of notes and tests performed by another provider:   None.  Brief History, Exam, Impression, and Recommendations:    Primary osteoarthritis of both knees Pleasant 73 year old female, known knee osteoarthritis, increasing pain right knee anterior aspect worse with running. On exam she has patellofemoral crepitus, the rest of the exam is normal, no effusion. She will do topical Voltaren, oral acetaminophen  and maybe a couple weeks of meloxicam . I would like updated x-rays, she will do some home conditioning and return to see me if not better in 6 weeks, we will consider steroid injection +/- viscosupplementation if not better.    ____________________________________________ Joselyn Nicely. Sandy Crumb, M.D., ABFM., CAQSM., AME. Primary Care and Sports Medicine [facility]  Adjunct Professor of [facility] Medicine  [employer]  Restaurant manager, fast food

## 2023-10-01 NOTE — Assessment & Plan Note (Signed)
 Pleasant 73 year old female, known knee osteoarthritis, increasing pain right knee anterior aspect worse with running. On exam she has patellofemoral crepitus, the rest of the exam is normal, no effusion. She will do topical Voltaren, oral acetaminophen  and maybe a couple weeks of meloxicam . I would like updated x-rays, she will do some home conditioning and return to see me if not better in 6 weeks, we will consider steroid injection +/- viscosupplementation if not better.

## 2023-10-10 ENCOUNTER — Ambulatory Visit: Payer: Self-pay | Admitting: Sports Medicine

## 2023-10-10 DIAGNOSIS — K148 Other diseases of tongue: Secondary | ICD-10-CM | POA: Diagnosis not present

## 2023-11-13 ENCOUNTER — Ambulatory Visit: Admitting: Sports Medicine

## 2023-11-14 DIAGNOSIS — H40043 Steroid responder, bilateral: Secondary | ICD-10-CM | POA: Diagnosis not present

## 2023-11-14 DIAGNOSIS — H18513 Endothelial corneal dystrophy, bilateral: Secondary | ICD-10-CM | POA: Diagnosis not present

## 2023-11-14 DIAGNOSIS — Z947 Corneal transplant status: Secondary | ICD-10-CM | POA: Diagnosis not present

## 2023-11-25 ENCOUNTER — Ambulatory Visit
Admission: RE | Admit: 2023-11-25 | Discharge: 2023-11-25 | Disposition: A | Discharge status: Home / Self Care | Payer: Medicare Other | Source: Ambulatory Visit | Attending: Hematology and Oncology

## 2023-11-25 ENCOUNTER — Ambulatory Visit

## 2023-11-25 DIAGNOSIS — M899 Disorder of bone, unspecified: Secondary | ICD-10-CM

## 2023-11-25 DIAGNOSIS — C50211 Malignant neoplasm of upper-inner quadrant of right female breast: Secondary | ICD-10-CM

## 2023-11-25 DIAGNOSIS — Z170 Estrogen receptor positive status [ER+]: Secondary | ICD-10-CM

## 2023-11-25 DIAGNOSIS — Z853 Personal history of malignant neoplasm of breast: Secondary | ICD-10-CM | POA: Diagnosis not present

## 2023-11-25 DIAGNOSIS — M48061 Spinal stenosis, lumbar region without neurogenic claudication: Secondary | ICD-10-CM | POA: Diagnosis not present

## 2023-11-25 MED ORDER — GADOPICLENOL 0.5 MMOL/ML IV SOLN
5.0000 mL | Freq: Once | INTRAVENOUS | Status: AC | PRN
  Administered 2023-11-25 (×2): 5 mL via INTRAVENOUS

## 2023-11-29 ENCOUNTER — Other Ambulatory Visit: Payer: Self-pay | Admitting: *Deleted

## 2023-11-29 DIAGNOSIS — Z170 Estrogen receptor positive status [ER+]: Secondary | ICD-10-CM

## 2023-12-02 ENCOUNTER — Ambulatory Visit (HOSPITAL_BASED_OUTPATIENT_CLINIC_OR_DEPARTMENT_OTHER): Admitting: Hematology and Oncology

## 2023-12-02 ENCOUNTER — Inpatient Hospital Stay: Attending: Hematology and Oncology

## 2023-12-02 VITALS — BP 143/75 | HR 76 | Temp 98.8°F | Resp 16 | Ht 60.05 in | Wt 112.5 lb

## 2023-12-02 DIAGNOSIS — C50211 Malignant neoplasm of upper-inner quadrant of right female breast: Secondary | ICD-10-CM | POA: Diagnosis not present

## 2023-12-02 DIAGNOSIS — Z17411 Hormone receptor positive with human epidermal growth factor receptor 2 negative status: Secondary | ICD-10-CM | POA: Diagnosis not present

## 2023-12-02 DIAGNOSIS — Z79810 Long term (current) use of selective estrogen receptor modulators (SERMs): Secondary | ICD-10-CM | POA: Insufficient documentation

## 2023-12-02 DIAGNOSIS — Z170 Estrogen receptor positive status [ER+]: Secondary | ICD-10-CM

## 2023-12-02 LAB — CBC WITH DIFFERENTIAL (CANCER CENTER ONLY)
Abs Immature Granulocytes: 0.01 K/uL (ref 0.00–0.07)
Basophils Absolute: 0 K/uL (ref 0.0–0.1)
Basophils Relative: 1 %
Eosinophils Absolute: 0 K/uL (ref 0.0–0.5)
Eosinophils Relative: 1 %
HCT: 39.2 % (ref 36.0–46.0)
Hemoglobin: 13.5 g/dL (ref 12.0–15.0)
Immature Granulocytes: 0 %
Lymphocytes Relative: 17 %
Lymphs Abs: 0.8 K/uL (ref 0.7–4.0)
MCH: 31.1 pg (ref 26.0–34.0)
MCHC: 34.4 g/dL (ref 30.0–36.0)
MCV: 90.3 fL (ref 80.0–100.0)
Monocytes Absolute: 0.5 K/uL (ref 0.1–1.0)
Monocytes Relative: 9 %
Neutro Abs: 3.6 K/uL (ref 1.7–7.7)
Neutrophils Relative %: 72 %
Platelet Count: 130 K/uL — ABNORMAL LOW (ref 150–400)
RBC: 4.34 MIL/uL (ref 3.87–5.11)
RDW: 12.3 % (ref 11.5–15.5)
WBC Count: 5 K/uL (ref 4.0–10.5)
nRBC: 0 % (ref 0.0–0.2)

## 2023-12-02 LAB — CMP (CANCER CENTER ONLY)
ALT: 13 U/L (ref 0–44)
AST: 23 U/L (ref 15–41)
Albumin: 4.1 g/dL (ref 3.5–5.0)
Alkaline Phosphatase: 39 U/L (ref 38–126)
Anion gap: 6 (ref 5–15)
BUN: 16 mg/dL (ref 8–23)
CO2: 31 mmol/L (ref 22–32)
Calcium: 8.8 mg/dL — ABNORMAL LOW (ref 8.9–10.3)
Chloride: 109 mmol/L (ref 98–111)
Creatinine: 0.84 mg/dL (ref 0.44–1.00)
GFR, Estimated: >60 mL/min (ref >60)
Glucose, Bld: 98 mg/dL (ref 70–99)
Potassium: 3.9 mmol/L (ref 3.5–5.1)
Sodium: 146 mmol/L — ABNORMAL HIGH (ref 135–145)
Total Bilirubin: 0.5 mg/dL (ref 0.0–1.2)
Total Protein: 6.5 g/dL (ref 6.5–8.1)

## 2023-12-02 NOTE — Assessment & Plan Note (Signed)
 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  She ran a half marathon recently.   Breast cancer surveillance: 1.  Breast exam 05/28/2023: Benign 2. Mammogram 08/23/2023: Benign breast density category B 3.  MRI lumbar spine 11/25/2023: Delzer pending 4.  PET/CT scan 11/19/2022: No hypermetabolic activity noted in the spine. 5.  MRI spine 05/23/2023: Persistent T2 enhancement T11 and L2.  Unchanged from before differential between hemangioma versus metastatic disease.  Repeat MRI in 6 months and follow-up after that.

## 2023-12-02 NOTE — Progress Notes (Signed)
 Patient Care Team: Curtis Debby PARAS, MD as PCP - General (Sports Medicine) Gifford Allen Sharper, MD as Consulting Physician (Ophthalmology) Vanderbilt Debby, MD as Consulting Physician (General Surgery) Avram Lupita BRAVO, MD as Consulting Physician (Gastroenterology) Renda Glance, MD as Consulting Physician (Urology) Odean Potts, MD as Consulting Physician (Hematology and Oncology)  DIAGNOSIS:  Encounter Diagnosis  Name Primary?   Malignant neoplasm of upper-inner quadrant of right breast in female, estrogen receptor positive (HCC) Yes    SUMMARY OF ONCOLOGIC HISTORY: Oncology History  Malignant neoplasm of upper-inner quadrant of right breast in female, estrogen receptor positive (HCC)  09/29/2018 Initial Diagnosis   Malignant neoplasm of upper-inner quadrant of right breast in female, estrogen receptor positive (HCC)   10/08/2018 Cancer Staging   Staging form: Breast, AJCC 8th Edition - Clinical: Stage IA (cT1b, cN0, cM0, G1, ER+, PR+, HER2-) - Signed by Layla Sandria BROCKS, MD on 05/11/2020   10/30/2018 Cancer Staging   Staging form: Breast, AJCC 8th Edition - Pathologic stage from 10/30/2018: Stage IA (pT1b, pN0, cM0, G1, ER+, PR+, HER2-) - Signed by Crawford Morna Pickle, NP on 11/05/2018   11/06/2018 Genetic Testing   Negative genetic testing on the [facility] Multi-Cancer panel.  The report date is 11/06/2018.  The Multi-Gene Panel offered by [facility] includes sequencing and/or deletion duplication testing of the following 85 genes: AIP, ALK, APC, ATM, AXIN2,BAP1,  BARD1, BLM, BMPR1A, BRCA1, BRCA2, BRIP1, CASR, CDC73, CDH1, CDK4, CDKN1B, CDKN1C, CDKN2A (p14ARF), CDKN2A (p16INK4a), CEBPA, CHEK2, CTNNA1, DICER1, DIS3L2, EGFR (c.2369C>T, p.Thr790Met variant only), EPCAM (Deletion/duplication testing only), FH, FLCN, GATA2, GPC3, GREM1 (Promoter region deletion/duplication testing only), HOXB13 (c.251G>A, p.Gly84Glu), HRAS, KIT, MAX, MEN1, MET, MITF (c.952G>A, p.Glu318Lys  variant only), MLH1, MSH2, MSH3, MSH6, MUTYH, NBN, NF1, NF2, NTHL1, PALB2, PDGFRA, PHOX2B, PMS2, POLD1, POLE, POT1, PRKAR1A, PTCH1, PTEN, RAD50, RAD51C, RAD51D, RB1, RECQL4, RET, RNF43, RUNX1, SDHAF2, SDHA (sequence changes only), SDHB, SDHC, SDHD, SMAD4, SMARCA4, SMARCB1, SMARCE1, STK11, SUFU, TERC, TERT, TMEM127, TP53, TSC1, TSC2, VHL, WRN and WT1.       CHIEF COMPLIANT: Follow-up of recently performed MRI of the spine  HISTORY OF PRESENT ILLNESS:   History of Present Illness Joan Lopez is a 73 year old female who presents for follow-up regarding MRI findings of the spine.  She has a history of cancer diagnosed in 2020 and is under surveillance for potential metastatic disease. An MRI was performed to monitor lesions in the spine at T12, L1, and L2, initially identified as persistent T2 lesions with a differential of hemangioma versus metastatic disease. These findings have remained stable, with no new or worsening symptoms.  She started tamoxifen  in October 2020 as part of her cancer treatment. She experiences some stiffness but otherwise tolerates the medication well. No symptoms related to her spine lesions, which were discovered incidentally during an evaluation for a hamstring problem.     ALLERGIES:  is allergic to amoxicillin, augmentin [amoxicillin-pot clavulanate], and clavulanic acid.  MEDICATIONS:  Current Outpatient Medications  Medication Sig Dispense Refill   calcium  carbonate (OSCAL) 1500 (600 Ca) MG TABS tablet Take 1 tablet (1,500 mg total) by mouth 2 (two) times daily with a meal.     folic acid  (FOLVITE ) 1 MG tablet Take 1 tablet (1 mg total) by mouth daily. 90 tablet 3   loteprednol  (LOTEMAX ) 0.5 % ophthalmic suspension Place 1 drop into both eyes as directed. Reported on 06/07/2015     vitamin B-12 (CYANOCOBALAMIN) 1000 MCG tablet Take 1 tablet (1,000 mcg total) by mouth daily.  90 tablet 3   No current facility-administered medications for this visit.     PHYSICAL EXAMINATION: ECOG PERFORMANCE STATUS: 1 - Symptomatic but completely ambulatory  Vitals:   12/02/23 0814  BP: (!) 143/75  Pulse: 76  Resp: 16  Temp: 98.8 F (37.1 C)  SpO2: 99%   Filed Weights   12/02/23 0814  Weight: 112 lb 8 oz (51 kg)      LABORATORY DATA:  I have reviewed the data as listed    Latest Ref Rng & Units 09/11/2022    7:58 AM 09/06/2021    8:26 AM 05/26/2021   12:00 AM  CMP  Glucose 70 - 99 mg/dL 898  892  99   BUN 8 - 23 mg/dL 24  20  20    Creatinine 0.44 - 1.00 mg/dL 9.22  9.14  9.20   Sodium 135 - 145 mmol/L 144  141  143   Potassium 3.5 - 5.1 mmol/L 4.1  4.7  4.0   Chloride 98 - 111 mmol/L 109  107  106   CO2 22 - 32 mmol/L 29  29  30    Calcium  8.9 - 10.3 mg/dL 8.8  9.4  9.0   Total Protein 6.5 - 8.1 g/dL 6.5  6.9    Total Bilirubin 0.3 - 1.2 mg/dL 0.6  0.5    Alkaline Phos 38 - 126 U/L 30  39    AST 15 - 41 U/L 21  18    ALT 0 - 44 U/L 13  12      Lab Results  Component Value Date   WBC 5.0 12/02/2023   HGB 13.5 12/02/2023   HCT 39.2 12/02/2023   MCV 90.3 12/02/2023   PLT 130 (L) 12/02/2023   NEUTROABS 3.6 12/02/2023    ASSESSMENT & PLAN:  Malignant neoplasm of upper-inner quadrant of right breast in female, estrogen receptor positive (HCC) 09/26/2018: Right UOQ T1BN0 stage Ia grade 1 IDC ER/PR positive HER2 negative Ki-67 5% 10/24/2018: Right lumpectomy: T1BN0 stage Ia grade 1 IDC negative margins 0/3 lymph nodes negative 12/01/2018-12/29/2018: Adjuvant radiation October 2020-October 2025: Tamoxifen   Genetics: No mutations BCI: Low risk no benefit from extended endocrine therapy   Tamoxifen  toxicities: 1.  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  She ran a half marathon recently.   Breast cancer surveillance: 1.  Breast exam 05/28/2023: Benign 2. Mammogram 08/23/2023: Benign breast density category B 3.  MRI lumbar spine 11/25/2023:  Stable benign findings.  I do not recommend any further MRIs. 4.  PET/CT scan 11/19/2022: No hypermetabolic activity noted in the spine.  Return to clinic on an as-needed basis.  She will continue her annual mammograms with her primary care physician.     No orders of the defined types were placed in this encounter.  The patient has a good understanding of the overall plan. she agrees with it. she will call with any problems that may develop before the next visit here. Total time spent: 30 mins including face to face time and time spent for planning, charting and co-ordination of care   Viinay K Kaine Mcquillen, MD 12/02/23

## 2023-12-17 ENCOUNTER — Telehealth (HOSPITAL_BASED_OUTPATIENT_CLINIC_OR_DEPARTMENT_OTHER): Payer: Self-pay | Admitting: *Deleted

## 2023-12-17 ENCOUNTER — Encounter: Payer: Self-pay | Admitting: Sports Medicine

## 2023-12-17 NOTE — Telephone Encounter (Signed)
 Routing to Jenny and Kiana for help with trying to get pt scheduled for a transfer of care appt with Dr. De Peru

## 2023-12-17 NOTE — Telephone Encounter (Signed)
 Copied from CRM [id number]. Topic: Appointments - Transfer of Care >> Dec 17, 2023  8:26 AM Joan Lopez wrote: Pt is requesting to transfer FROM: [facility] Pt is requesting to transfer TO: [facility]  Reason for requested transfer: pcp has left  It is the responsibility of the team the patient would like to transfer to (Dr. Quintin, De Peru ) to reach out to the patient if for any reason this transfer is not acceptable.

## 2024-01-01 ENCOUNTER — Encounter: Payer: Self-pay | Admitting: Physician Assistant

## 2024-02-21 NOTE — Progress Notes (Signed)
 Chief Complaint: Anal discomfort  HPI:    Mrs. Doorn is a 73 year old female, known to Dr. Avram, with a past medical history as listed below including constipation and breast cancer, who was referred to me by Curtis Debby PARAS,* for a complaint of anal discomfort.      04/19/2011 office visit with Dr. Avram for left lower quadrant pain.  At that time thought related to lifting her dogs.    09/14/2013 colonoscopy with mild diverticulosis in the entire colon otherwise normal.  External hemorrhoids.  Repeat recommended 10 years.    11/25/2023 MRI of the lumbar spine with and without contrast showed stable MRI appearance of T11 through lumbar vertebrae since 10/15/2022 favoring benign bone lesion such as atypical hemangiomas and chronic lumbar spine degeneration most notable L4-L5 grade 1 spondylolisthesis with chronic facet arthropathy and mild multifactorial spinal stenosis.    12/02/2023 CBC with a platelet count of 130 and otherwise normal.  CMP with an elevated sodium at 146 and otherwise normal.    Today, the patient tells me about a month ago she had 2 days in a row where she felt like a fluttering in her rectum that occurred a few times those days, then went away and she felt it 1 day this past week just 1 time, tells me it is a strange sensation but not really painful.  Denies any other changes in her health including a change in bowel habits, rectal pain, blood in her stool or weight loss.  She is aware she is due for 1 more colonoscopy.    Patient was a marathon runner, unfortunately recently her husband had to have a below the knee amputation and she has to stay home and take care of him.    Denies fever, chills, weight loss, nausea, vomiting or symptoms that awaken her from sleep.  Past Medical History:  Diagnosis Date   Abnormal LFTs 10/29/2012   Acquired bilateral renal cysts    Arthritis    Bilateral bunions    Constipation 10/16/2011   DDD (degenerative disc disease)    spine w  narrowing   Dehydration, mild 12/26/2011   Diverticulitis    Exercise counseling 11/14/2011   Family history of brain cancer    Family history of breast cancer    Family history of cancer    Family history of colon cancer    Family history of kidney cancer    Family history of oral cancer    Family history of stomach cancer    Fuch's endothelial dystrophy    Guttate psoriasis    Hematuria 08/24/2011   Hemorrhoids    History of chicken pox    Hyperlipidemia    Kidney stones    Lipoma of arm 07/13/2012   Lipoma of arm 10/07/2012   Left    Mass of arm 07/13/2012   Has had them in past     Personal history of radiation therapy    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   Post-menopausal bleeding 10/08/2011   Reflux 04/17/2012   Renal insufficiency 01/11/2013   Thrombocytopenia 09/27/2011    Past Surgical History:  Procedure Laterality Date   APPENDECTOMY  1986   BREAST LUMPECTOMY Right 10/2018   BREAST LUMPECTOMY WITH RADIOACTIVE SEED AND SENTINEL LYMPH NODE BIOPSY Right 10/24/2018   Procedure: RIGHT BREAST LUMPECTOMY WITH RADIOACTIVE SEED AND RIGHT AXILLARY SENTINEL LYMPH NODE BIOPSY, RIGHT BLUE DYE INJECTION;  Surgeon: Gail Favorite, MD;  Location: [facility];  Service: General;  Laterality: Right;  PEC BLOCK   CESAREAN SECTION     CHOLECYSTECTOMY     COLONOSCOPY  2005   (records here)[place] - diverticulosis and hemorrhoids   CORNEAL TRANSPLANT  2013   CYSTOSCOPY WITH RETROGRADE PYELOGRAM, URETEROSCOPY AND STENT PLACEMENT Right 10/22/2013   Procedure: CYSTOSCOPY WITH RETROGRADE PYELOGRAM, URETEROSCOPY ;  Surgeon: Gretel Ferrara, MD;  Location: [facility];  Service: Urology;  Laterality: Right;   LIPOMA EXCISION  2014   x 3, both arms   TONSILLECTOMY     possible adenoids   TOTAL ABDOMINAL HYSTERECTOMY  1991   for fibroids   TUBAL LIGATION     WRIST FRACTURE SURGERY     right, titanium plate and pins    Current Outpatient Medications  Medication Sig Dispense Refill    calcium  carbonate (OSCAL) 1500 (600 Ca) MG TABS tablet Take 1 tablet (1,500 mg total) by mouth 2 (two) times daily with a meal.     folic acid  (FOLVITE ) 1 MG tablet Take 1 tablet (1 mg total) by mouth daily. 90 tablet 3   loteprednol  (LOTEMAX ) 0.5 % ophthalmic suspension Place 1 drop into both eyes as directed. Reported on 06/07/2015     vitamin B-12 (CYANOCOBALAMIN) 1000 MCG tablet Take 1 tablet (1,000 mcg total) by mouth daily. 90 tablet 3   No current facility-administered medications for this visit.    Allergies as of 02/24/2024 - Review Complete 10/01/2023  Allergen Reaction Noted   Amoxicillin  02/13/2023   Augmentin [amoxicillin-pot clavulanate]  08/24/2011   Clavulanic acid  02/13/2023    Family History  Problem Relation Age of Onset   Stroke Mother    Heart disease Mother    Hypertension Mother    Kidney disease Father    Breast cancer Sister 93   Cancer Sister 11       ductal carcinoma breast- had it in 1999 and just found out its in the opposite breast   Brain cancer Maternal Grandmother 10       brain tumor   Breast cancer Paternal Grandmother 25       post menopausal    Pneumonia Maternal Grandfather    Colon cancer Maternal Aunt 82       38s or 57s   Cancer Paternal Uncle 25       neck tumor   Stomach cancer Maternal Aunt 51       12s   Kidney cancer Cousin 30       30s, maternal first cousin   Cancer Paternal Aunt        blood cancer, unknown but older age at diagnosis   Cancer Maternal Aunt 90       unknown, but not breast, 101s   Cancer Cousin        oral cancer diagnosed older, maternal first cousin   Esophageal cancer Neg Hx    Pancreatic cancer Neg Hx    Rectal cancer Neg Hx     Social History   Socioeconomic History   Marital status: Married    Spouse name: Not on file   Number of children: Not on file   Years of education: Not on file   Highest education level: Professional school degree (e.g., MD, DDS, DVM, JD)  Occupational History   Not  on file  Tobacco Use   Smoking status: Never   Smokeless tobacco: Never  Substance and Sexual Activity   Alcohol use: No   Drug use: No   Sexual activity: Yes  Other Topics Concern  Not on file  Social History Narrative   She live with her husband. She enjoys reading, puzzles and running.    Social Drivers of Corporate Investment Banker Strain: Low Risk  (09/30/2023)   Overall Financial Resource Strain (CARDIA)    Difficulty of Paying Living Expenses: Not hard at all  Food Insecurity: No Food Insecurity (09/30/2023)   Hunger Vital Sign    Worried About Running Out of Food in the Last Year: Never true    Ran Out of Food in the Last Year: Never true  Transportation Needs: No Transportation Needs (09/30/2023)   PRAPARE - Administrator, Civil Service (Medical): No    Lack of Transportation (Non-Medical): No  Physical Activity: Sufficiently Active (09/30/2023)   Exercise Vital Sign    Days of Exercise per Week: 7 days    Minutes of Exercise per Session: 60 min  Stress: No Stress Concern Present (09/30/2023)   [employer] of Occupational Health - Occupational Stress Questionnaire    Feeling of Stress: Only a little  Social Connections: Moderately Isolated (09/30/2023)   Social Connection and Isolation Panel    Frequency of Communication with Friends and Family: More than three times a week    Frequency of Social Gatherings with Friends and Family: Three times a week    Attends Religious Services: Never    Active Member of Clubs or Organizations: No    Attends Banker Meetings: Not on file    Marital Status: Married  Catering Manager Violence: Not At Risk (09/12/2023)   Humiliation, Afraid, Rape, and Kick questionnaire    Fear of Current or Ex-Partner: No    Emotionally Abused: No    Physically Abused: No    Sexually Abused: No    Review of Systems:    Constitutional: No weight loss, fever or chills Skin: No rash  Cardiovascular: No chest pain    Respiratory: No SOB  Gastrointestinal: See HPI and otherwise negative Genitourinary: No dysuria  Neurological: No headache, dizziness or syncope Musculoskeletal: No new muscle or joint pain Hematologic: No bleeding Psychiatric: No history of depression or anxiety   Physical Exam:  Vital signs: BP 118/78   Pulse 72   Ht 5' 0.5 (1.537 m)   Wt 117 lb 3.2 oz (53.2 kg)   BMI 22.51 kg/m    Constitutional:   Pleasant elderly Caucasian female appears to be in NAD, Well developed, Well nourished, alert and cooperative Head:  Normocephalic and atraumatic. Eyes:   PEERL, EOMI. No icterus. Conjunctiva pink. Ears:  Normal auditory acuity. Neck:  Supple Throat: Oral cavity and pharynx without inflammation, swelling or lesion.  Respiratory: Respirations even and unlabored. Lungs clear to auscultation bilaterally.   No wheezes, crackles, or rhonchi.  Cardiovascular: Normal S1, S2. No MRG. Regular rate and rhythm. No peripheral edema, cyanosis or pallor.  Gastrointestinal:  Soft, nondistended, nontender. No rebound or guarding. Normal bowel sounds. No appreciable masses or hepatomegaly. Rectal:  Not performed.  Msk:  Symmetrical without gross deformities. Without edema, no deformity or joint abnormality.  Neurologic:  Alert and  oriented x4;  grossly normal neurologically.  Skin:   Dry and intact without significant lesions or rashes. Psychiatric: Demonstrates good judgement and reason without abnormal affect or behaviors.  RELEVANT LABS AND IMAGING: CBC    Component Value Date/Time   WBC 5.0 12/02/2023 0745   WBC 4.6 03/22/2021 0000   RBC 4.34 12/02/2023 0745   HGB 13.5 12/02/2023 0745   HCT 39.2  12/02/2023 0745   PLT 130 (L) 12/02/2023 0745   MCV 90.3 12/02/2023 0745   MCH 31.1 12/02/2023 0745   MCHC 34.4 12/02/2023 0745   RDW 12.3 12/02/2023 0745   LYMPHSABS 0.8 12/02/2023 0745   MONOABS 0.5 12/02/2023 0745   EOSABS 0.0 12/02/2023 0745   BASOSABS 0.0 12/02/2023 0745    CMP      Component Value Date/Time   NA 146 (H) 12/02/2023 0745   K 3.9 12/02/2023 0745   CL 109 12/02/2023 0745   CO2 31 12/02/2023 0745   GLUCOSE 98 12/02/2023 0745   BUN 16 12/02/2023 0745   CREATININE 0.84 12/02/2023 0745   CREATININE 0.79 05/26/2021 0000   CALCIUM  8.8 (L) 12/02/2023 0745   PROT 6.5 12/02/2023 0745   ALBUMIN 4.1 12/02/2023 0745   AST 23 12/02/2023 0745   ALT 13 12/02/2023 0745   ALKPHOS 39 12/02/2023 0745   BILITOT 0.5 12/02/2023 0745   GFRNONAA >60 12/02/2023 0745   GFRNONAA >89 04/02/2011 1037   GFRAA >60 05/11/2019 1448   GFRAA >89 04/02/2011 1037    Assessment: 1.  Anal discomfort: Last colonoscopy in 2015 with mild diverticulosis throughout the colon external hemorrhoids, repeat recommended 10 years, patient is due now, anal discomfort is more of a fluttering that she felt off and on for a couple of days, it has since gone away; consider nerve versus gas versus other 2.  Screening for colorectal cancer  Plan: 1.  Patient is very healthy.  Went ahead and scheduled her for her screening colonoscopy with Dr. Avram in the [facility].  Did provide the patient with a detailed list of risks for procedure and she agrees to proceed. Patient is appropriate for endoscopic procedure(s) in the ambulatory ([facility]) setting. 2.  Discussed fluttering in her rectum, thankfully this is now gone.  Could have been related to a nerve pain at the time. 3.  Patient to follow in clinic per recommendations after above.  Delon Failing, PA-C [facility] 02/21/2024, 11:07 AM  Cc: Curtis Debby JINNY DEWAINE

## 2024-02-24 ENCOUNTER — Encounter: Payer: Self-pay | Admitting: Physician Assistant

## 2024-02-24 ENCOUNTER — Ambulatory Visit: Admitting: Physician Assistant

## 2024-02-24 VITALS — BP 118/78 | HR 72 | Ht 60.5 in | Wt 117.2 lb

## 2024-02-24 DIAGNOSIS — Z1211 Encounter for screening for malignant neoplasm of colon: Secondary | ICD-10-CM

## 2024-02-24 DIAGNOSIS — K629 Disease of anus and rectum, unspecified: Secondary | ICD-10-CM

## 2024-02-24 MED ORDER — NA SULFATE-K SULFATE-MG SULF 17.5-3.13-1.6 GM/177ML PO SOLN: 1.0000 | Freq: Once | ORAL | 0 refills | Status: AC

## 2024-02-24 NOTE — Patient Instructions (Signed)

## 2024-03-04 ENCOUNTER — Encounter: Payer: Self-pay | Admitting: Urgent Care

## 2024-03-04 ENCOUNTER — Ambulatory Visit (INDEPENDENT_AMBULATORY_CARE_PROVIDER_SITE_OTHER): Admitting: Urgent Care

## 2024-03-04 VITALS — BP 142/92 | HR 73 | Ht 60.5 in | Wt 118.0 lb

## 2024-03-04 DIAGNOSIS — Z947 Corneal transplant status: Secondary | ICD-10-CM

## 2024-03-04 DIAGNOSIS — R030 Elevated blood-pressure reading, without diagnosis of hypertension: Secondary | ICD-10-CM | POA: Diagnosis not present

## 2024-03-04 DIAGNOSIS — Z853 Personal history of malignant neoplasm of breast: Secondary | ICD-10-CM | POA: Insufficient documentation

## 2024-03-04 DIAGNOSIS — D696 Thrombocytopenia, unspecified: Secondary | ICD-10-CM | POA: Diagnosis not present

## 2024-03-04 DIAGNOSIS — Z636 Dependent relative needing care at home: Secondary | ICD-10-CM | POA: Insufficient documentation

## 2024-03-04 NOTE — Progress Notes (Signed)
 Established Patient Office Visit  Subjective:  Patient ID: Joan Lopez, female    DOB: 1950/07/12  Age: 73 y.o. MRN: [id number]  Chief Complaint  Patient presents with   TRANSFER OF CARE    HPI  Discussed the use of AI scribe software for clinical note transcription with the patient, who gave verbal consent to proceed.  History of Present Illness   ASTOU LADA is a 73 year old female who presents for a transition of care visit.  She has no specific complaints today and typically visits the doctor only when experiencing a problem. She is currently taking over-the-counter supplements including B12, folic acid , and calcium  carbonate. 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  She recently completed a course of tamoxifen , with her last dose taken this past Monday. She reports no significant issues with blood pressure while on tamoxifen , although her blood pressure readings can vary. She recalls a recent reading of 118/78, which she describes as 'perfect'.  She has a history of hamstring issues, which have resolved after more than a year. She is a runner and attributes the injury to her running activities. She consulted with a sports medicine specialist, but the exact cause of the hamstring issue was not determined.  She experiences stress and anxiety, which she attributes to caregiver responsibilities for her husband, who had an amputation in June and has multiple health issues. She acknowledges overeating and weight gain. She is unable to exercise as she cannot leave her husband alone. She wants to manage her stress without medication, although she has considered natural supplements and was recently gifted CBD gummies.  She has a history of breast cancer, having passed the five-year mark post-treatment. She completed her last oncology visit after finishing tamoxifen .  She is scheduled for a colonoscopy on December 17th  and has recently been prescribed a prep for the procedure. She declines flu, shingles, and pneumonia vaccines at this time.      Patient Active Problem List   Diagnosis Date Noted   History of breast cancer 03/04/2024   Caregiver stress 03/04/2024   Dog bite, hand, right, initial encounter 07/05/2023   Benign paroxysmal positional vertigo 02/13/2023   Transient weakness of right lower extremity 02/05/2023   Hamstring strain, right, initial encounter 12/19/2022   Lesion of lumbar spine 10/15/2022   Seborrheic keratosis 10/10/2022   Closed fracture of base of fifth metatarsal bone with routine healing, right 05/26/2021   Ocular migraine 05/26/2021   Visual changes 03/22/2021   Anticipatory grieving 03/22/2021   Deficiency of vitamin B12 02/08/2021   Weakness of hip 01/17/2021   History of Descemet's stripping endothelial keratoplasty (DSEK) 08/30/2020   Hearing loss due to cerumen impaction, right 01/18/2020   Atrophic vaginitis 01/18/2020   Osteoporosis 03/10/2019   Genetic testing 11/06/2018   Tick bite 11/03/2018   Family history of breast cancer    Family history of colon cancer    Family history of stomach cancer    Family history of brain cancer    Family history of kidney cancer    Family history of oral cancer    Family history of cancer    Malignant neoplasm of upper-inner quadrant of right breast in female, estrogen receptor positive (HCC) 09/29/2018   Plantar fasciitis, right 08/15/2018   Right buttock pain 11/07/2015   Myalgia 11/07/2015   Exercise counseling 03/01/2015   Dysfunction of right rotator cuff 12/14/2014   Skin tag  12/14/2014   Mucocele of tongue 06/14/2014   Primary osteoarthritis of both knees 05/17/2014   Annual physical exam 08/20/2013   Benign essential hypertension 08/20/2013   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   Reflux 04/17/2012   Post-menopausal bleeding 10/08/2011   Thrombocytopenia 09/27/2011   Hematuria 08/24/2011   Trigeminal  neuralgia 02/06/2011   Fuchs' corneal dystrophy 11/28/2010   Radiculitis of left cervical region 04/06/2009   Past Medical History:  Diagnosis Date   Abnormal LFTs 10/29/2012   Acquired bilateral renal cysts    Arthritis    Bilateral bunions    Cancer (HCC) 08/2018   Constipation 10/16/2011   DDD (degenerative disc disease)    spine w narrowing   Dehydration, mild 12/26/2011   Diverticulitis    Exercise counseling 11/14/2011   Family history of brain cancer    Family history of breast cancer    Family history of cancer    Family history of colon cancer    Family history of kidney cancer    Family history of oral cancer    Family history of stomach cancer    Fuch's endothelial dystrophy    Guttate psoriasis    Hematuria 08/24/2011   Hemorrhoids    History of chicken pox    Hyperlipidemia    Kidney stones    Lipoma of arm 07/13/2012   Lipoma of arm 10/07/2012   Left    Mass of arm 07/13/2012   Has had them in past     Personal history of radiation therapy    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   Post-menopausal bleeding 10/08/2011   Reflux 04/17/2012   Renal insufficiency 01/11/2013   Thrombocytopenia 09/27/2011   Past Surgical History:  Procedure Laterality Date   APPENDECTOMY  1986   BREAST LUMPECTOMY Right 10/2018   BREAST LUMPECTOMY WITH RADIOACTIVE SEED AND SENTINEL LYMPH NODE BIOPSY Right 10/24/2018   Procedure: RIGHT BREAST LUMPECTOMY WITH RADIOACTIVE SEED AND RIGHT AXILLARY SENTINEL LYMPH NODE BIOPSY, RIGHT BLUE DYE INJECTION;  Surgeon: Gail Favorite, MD;  Location: [facility];  Service: General;  Laterality: Right;  PEC BLOCK   CESAREAN SECTION     CHOLECYSTECTOMY     COLONOSCOPY  2005   (records here)[place] - diverticulosis and hemorrhoids   CORNEAL TRANSPLANT  2013   CYSTOSCOPY WITH RETROGRADE PYELOGRAM, URETEROSCOPY AND STENT PLACEMENT Right 10/22/2013   Procedure: CYSTOSCOPY WITH RETROGRADE PYELOGRAM, URETEROSCOPY ;  Surgeon: Gretel Ferrara, MD;  Location: [facility];  Service: Urology;  Laterality: Right;   EYE SURGERY  2014   FRACTURE SURGERY  2012   LIPOMA EXCISION  2014   x 3, both arms   TONSILLECTOMY     possible adenoids   TOTAL ABDOMINAL HYSTERECTOMY  1991   for fibroids   TUBAL LIGATION     WRIST FRACTURE SURGERY     right, titanium plate and pins   Social History   Tobacco Use   Smoking status: Never   Smokeless tobacco: Never  Substance Use Topics   Alcohol use: No   Drug use: No      ROS: as noted in HPI  Objective:     BP (!) 142/92   Pulse 73   Ht 5' 0.5 (1.537 m)   Wt 118 lb (53.5 kg)   SpO2 95%   BMI 22.67 kg/m  BP Readings from Last 3 Encounters:  03/04/24 (!) 142/92  02/24/24 118/78  12/02/23 (!) 143/75   Wt Readings from Last 3 Encounters:  03/04/24 118  lb (53.5 kg)  02/24/24 117 lb 3.2 oz (53.2 kg)  12/02/23 112 lb 8 oz (51 kg)      Physical Exam Vitals and nursing note reviewed.  Constitutional:      General: She is not in acute distress.    Appearance: Normal appearance. She is not ill-appearing, toxic-appearing or diaphoretic.  HENT:     Head: Normocephalic and atraumatic.     Mouth/Throat:     Mouth: Mucous membranes are moist.     Pharynx: Oropharynx is clear. No oropharyngeal exudate or posterior oropharyngeal erythema.  Eyes:     General: No scleral icterus.       Right eye: No discharge.        Left eye: No discharge.     Extraocular Movements: Extraocular movements intact.     Pupils: Pupils are equal, round, and reactive to light.  Cardiovascular:     Rate and Rhythm: Normal rate and regular rhythm.     Pulses: Normal pulses.     Heart sounds: Normal heart sounds. No murmur heard. Pulmonary:     Effort: Pulmonary effort is normal. No respiratory distress.     Breath sounds: Normal breath sounds. No stridor. No wheezing or rhonchi.  Musculoskeletal:     Cervical back: Normal range of motion and neck supple. No rigidity or tenderness.   Lymphadenopathy:     Cervical: No cervical adenopathy.  Skin:    General: Skin is warm and dry.     Coloration: Skin is not jaundiced.     Findings: No bruising, erythema or rash.  Neurological:     General: No focal deficit present.     Mental Status: She is alert and oriented to person, place, and time.     Gait: Gait normal.  Psychiatric:        Mood and Affect: Mood normal.        Behavior: Behavior normal.      No results found for any visits on 03/04/24.  Last CBC Lab Results  Component Value Date   WBC 5.0 12/02/2023   HGB 13.5 12/02/2023   HCT 39.2 12/02/2023   MCV 90.3 12/02/2023   MCH 31.1 12/02/2023   RDW 12.3 12/02/2023   PLT 130 (L) 12/02/2023   Last metabolic panel Lab Results  Component Value Date   GLUCOSE 98 12/02/2023   NA 146 (H) 12/02/2023   K 3.9 12/02/2023   CL 109 12/02/2023   CO2 31 12/02/2023   BUN 16 12/02/2023   CREATININE 0.84 12/02/2023   GFRNONAA >60 12/02/2023   CALCIUM  8.8 (L) 12/02/2023   PHOS 2.9 01/30/2013   PROT 6.5 12/02/2023   ALBUMIN 4.1 12/02/2023   BILITOT 0.5 12/02/2023   ALKPHOS 39 12/02/2023   AST 23 12/02/2023   ALT 13 12/02/2023   ANIONGAP 6 12/02/2023   Last lipids Lab Results  Component Value Date   CHOL 196 02/13/2023   HDL 110 02/13/2023   LDLCALC 77 02/13/2023   TRIG 45 02/13/2023   CHOLHDL 1.8 02/13/2023   Last hemoglobin A1c Lab Results  Component Value Date   HGBA1C 5.3 02/13/2023   Last thyroid  functions Lab Results  Component Value Date   TSH 1.210 02/13/2023   Last vitamin D  Lab Results  Component Value Date   VD25OH 57 07/05/2009   Last vitamin B12 and Folate Lab Results  Component Value Date   VITAMINB12 >2,000 (H) 03/22/2021      The ASCVD Risk score (Arnett DK, et al.,  2019) failed to calculate for the following reasons:   The valid HDL cholesterol range is 20 to 100 mg/dL  Assessment & Plan:  Thrombocytopenia  History of breast cancer  Elevated blood pressure  reading in office with white coat syndrome, without diagnosis of hypertension  Caregiver stress  Post corneal transplant  Assessment and Plan    Depression and caregiver stress Significant caregiver stress and depression due to husband's health issues. Prefers non-pharmacological interventions. - Consider L-tryptophan for serotonin enhancement and sleep aid. - Explore caregiver support programs for respite care. - Encouraged physical activity as tolerated, potentially through gym workouts.  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 No recurrence post five-year mark.  History of surgical menopause (s/p hysterectomy with oophorectomy) Underwent hysterectomy with oophorectomy in 1991. No current menopausal symptoms.  History of corneal transplant and cataract surgery Corneal transplants and cataract surgery in 2014. Using Lodomax eye drops post-surgery.  Elevated blood pressure reading without diagnosis of hypertension Blood pressure readings vary, recent at 118/78 mmHg. No consistent hypertension diagnosis.  General Health Maintenance Declines flu, shingles, and pneumonia vaccines. - Discontinued reminders for flu, shingles, and pneumonia vaccines.  - colonoscopy scheduled next month       Return in about 6 months (around 09/01/2024) for Annual Physical.   Benton LITTIE Gave, PA

## 2024-03-04 NOTE — Patient Instructions (Addendum)
 Consider taking L-tryptophan as this will help. 2 tabs prior to bed.  Monitor BP at home goal 120/80. Return in 2 weeks for nurse visit - BP recheck. Bring home BP log and BP cuff.  Return in 6 months for annual physical and fasting labs,

## 2024-03-17 NOTE — Progress Notes (Unsigned)
   Subjective:    Patient ID: Joan Lopez, female    DOB: 1950/08/28, 73 y.o.   MRN: [id number]  HPI  Patient is here for a 2 week BP check. Patient is currently not taking any BP medication. Denies CP, SOB, headache, palpitations, or vision changes. Patient bought her cuff to compare. Pts BP log scanned into patients chart.  Review of Systems     Objective:   Physical Exam        Assessment & Plan:   Patients BP in office today is 131/85 with our cuff and 141/91 with her cuff. Patients has had stabile readings at home. Reported to PCP who is satisfied with these readings. Pt advised to f/u PRN

## 2024-03-18 ENCOUNTER — Ambulatory Visit (HOSPITAL_BASED_OUTPATIENT_CLINIC_OR_DEPARTMENT_OTHER): Admitting: Family Medicine

## 2024-03-18 ENCOUNTER — Ambulatory Visit

## 2024-03-18 VITALS — BP 131/85 | HR 77 | Resp 20 | Ht 60.5 in | Wt 118.0 lb

## 2024-03-18 DIAGNOSIS — I10 Essential (primary) hypertension: Secondary | ICD-10-CM | POA: Diagnosis not present

## 2024-03-23 ENCOUNTER — Encounter: Admitting: Internal Medicine

## 2024-03-31 ENCOUNTER — Telehealth: Payer: Self-pay | Admitting: Internal Medicine

## 2024-03-31 NOTE — Progress Notes (Unsigned)
 [facility] History and Physical   Primary Care Physician:  Lowella Benton CROME, [place]   Reason for Procedure:    Encounter Diagnosis  Name Primary?   Screening for colon cancer Yes     Plan:    colonoscopy   The patient was provided an opportunity to ask questions and all were answered. The patient agreed with the plan.   HPI: Joan Lopez is a 73 y.o. female presenting for a screening colonoscopy.  Examination in 2015 revealed diverticulosis throughout the colon but no neoplasia.   Past Medical History:  Diagnosis Date   Abnormal LFTs 10/29/2012   Acquired bilateral renal cysts    Arthritis    Bilateral bunions    Cancer (HCC) 08/2018   Constipation 10/16/2011   DDD (degenerative disc disease)    spine w narrowing   Dehydration, mild 12/26/2011   Diverticulitis    Exercise counseling 11/14/2011   Family history of brain cancer    Family history of breast cancer    Family history of cancer    Family history of colon cancer    Family history of kidney cancer    Family history of oral cancer    Family history of stomach cancer    Fuch's endothelial dystrophy    Guttate psoriasis    Hematuria 08/24/2011   Hemorrhoids    History of chicken pox    Hyperlipidemia    Kidney stones    Lipoma of arm 07/13/2012   Lipoma of arm 10/07/2012   Left    Mass of arm 07/13/2012   Has had them in past     Personal history of radiation therapy    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   Post-menopausal bleeding 10/08/2011   Reflux 04/17/2012   Renal insufficiency 01/11/2013   Thrombocytopenia 09/27/2011    Past Surgical History:  Procedure Laterality Date   APPENDECTOMY  1986   BREAST LUMPECTOMY Right 10/2018   BREAST LUMPECTOMY WITH RADIOACTIVE SEED AND SENTINEL LYMPH NODE BIOPSY Right 10/24/2018   Procedure: RIGHT BREAST LUMPECTOMY WITH RADIOACTIVE SEED AND RIGHT AXILLARY SENTINEL LYMPH NODE BIOPSY, RIGHT BLUE DYE INJECTION;  Surgeon: Gail Favorite, MD;   Location: [facility];  Service: General;  Laterality: Right;  PEC BLOCK   CESAREAN SECTION     CHOLECYSTECTOMY     COLONOSCOPY  2005   (records here)[place] - diverticulosis and hemorrhoids   CORNEAL TRANSPLANT  2013   CYSTOSCOPY WITH RETROGRADE PYELOGRAM, URETEROSCOPY AND STENT PLACEMENT Right 10/22/2013   Procedure: CYSTOSCOPY WITH RETROGRADE PYELOGRAM, URETEROSCOPY ;  Surgeon: Gretel Ferrara, MD;  Location: [facility];  Service: Urology;  Laterality: Right;   EYE SURGERY  2014   FRACTURE SURGERY  2012   LIPOMA EXCISION  2014   x 3, both arms   TONSILLECTOMY     possible adenoids   TOTAL ABDOMINAL HYSTERECTOMY  1991   for fibroids   TUBAL LIGATION     WRIST FRACTURE SURGERY     right, titanium plate and pins     Current Outpatient Medications  Medication Sig Dispense Refill   calcium  carbonate (OSCAL) 1500 (600 Ca) MG TABS tablet Take 1 tablet (1,500 mg total) by mouth 2 (two) times daily with a meal.     folic acid  (FOLVITE ) 1 MG tablet Take 1 tablet (1 mg total) by mouth daily. 90 tablet 3   loteprednol  (LOTEMAX ) 0.5 % ophthalmic suspension Place 1 drop into both eyes as directed. Reported on 06/07/2015  Na Sulfate-K Sulfate-Mg Sulfate concentrate (SUPREP) 17.5-3.13-1.6 GM/177ML SOLN Take 1 kit by mouth once.     vitamin B-12 (CYANOCOBALAMIN) 1000 MCG tablet Take 1 tablet (1,000 mcg total) by mouth daily. 90 tablet 3   No current facility-administered medications for this visit.    Allergies as of 04/01/2024 - Review Complete 03/18/2024  Allergen Reaction Noted   Amoxicillin  02/13/2023   Augmentin [amoxicillin-pot clavulanate]  08/24/2011   Clavulanic acid  02/13/2023    Family History  Problem Relation Age of Onset   Stroke Mother    Heart disease Mother    Hypertension Mother    Arthritis Mother    Kidney disease Father    Breast cancer Sister 1   Cancer Sister 34       ductal carcinoma breast- had it in 1999 and just found out its in the  opposite breast   Brain cancer Maternal Grandmother 3       brain tumor   Breast cancer Paternal Grandmother 81       post menopausal    Pneumonia Maternal Grandfather    Colon cancer Maternal Aunt 41       22s or 79s   Cancer Paternal Uncle 78       neck tumor   Stomach cancer Maternal Aunt 85       75s   Kidney cancer Cousin 30       30s, maternal first cousin   Cancer Paternal Aunt        blood cancer, unknown but older age at diagnosis   Cancer Maternal Aunt 38       unknown, but not breast, 34s   Cancer Cousin        oral cancer diagnosed older, maternal first cousin   Esophageal cancer Neg Hx    Pancreatic cancer Neg Hx    Rectal cancer Neg Hx     Social History   Socioeconomic History   Marital status: Married    Spouse name: Not on file   Number of children: Not on file   Years of education: Not on file   Highest education level: Professional school degree (e.g., MD, DDS, DVM, JD)  Occupational History   Not on file  Tobacco Use   Smoking status: Never   Smokeless tobacco: Never  Substance and Sexual Activity   Alcohol use: No   Drug use: No   Sexual activity: Yes  Other Topics Concern   Not on file  Social History Narrative   She live with her husband. She enjoys reading, puzzles and running.    Social Drivers of Health   Tobacco Use: Low Risk (03/04/2024)   Patient History    Smoking Tobacco Use: Never    Smokeless Tobacco Use: Never    Passive Exposure: Not on file  Financial Resource Strain: Low Risk (09/30/2023)   Overall Financial Resource Strain (CARDIA)    Difficulty of Paying Living Expenses: Not hard at all  Food Insecurity: No Food Insecurity (09/30/2023)   Epic    Worried About Programme Researcher, Broadcasting/film/video in the Last Year: Never true    Ran Out of Food in the Last Year: Never true  Transportation Needs: No Transportation Needs (09/30/2023)   Epic    Lack of Transportation (Medical): No    Lack of Transportation (Non-Medical): No  Physical  Activity: Sufficiently Active (09/30/2023)   Exercise Vital Sign    Days of Exercise per Week: 7 days    Minutes of Exercise  per Session: 60 min  Stress: No Stress Concern Present (09/30/2023)   [employer] of Occupational Health - Occupational Stress Questionnaire    Feeling of Stress: Only a little  Social Connections: Moderately Isolated (09/30/2023)   Social Connection and Isolation Panel    Frequency of Communication with Friends and Family: More than three times a week    Frequency of Social Gatherings with Friends and Family: Three times a week    Attends Religious Services: Never    Active Member of Clubs or Organizations: No    Attends Banker Meetings: Not on file    Marital Status: Married  Intimate Partner Violence: Not At Risk (09/12/2023)   Humiliation, Afraid, Rape, and Kick questionnaire    Fear of Current or Ex-Partner: No    Emotionally Abused: No    Physically Abused: No    Sexually Abused: No  Depression (PHQ2-9): Low Risk (09/12/2023)   Depression (PHQ2-9)    PHQ-2 Score: 1  Alcohol Screen: Low Risk (09/12/2023)   Alcohol Screen    Last Alcohol Screening Score (AUDIT): 0  Housing: Unknown (11/14/2023)   Received from [facility]   Epic    Unable to Pay for Housing in the Last Year: Not on file    Number of Times Moved in the Last Year: Not on file    At any time in the past 12 months, were you homeless or living in a shelter (including now)?: No  Utilities: Not At Risk (09/12/2023)   [facility] Utilities    Threatened with loss of utilities: No  Health Literacy: Adequate Health Literacy (09/12/2023)   B1300 Health Literacy    Frequency of need for help with medical instructions: Never    Review of Systems: Positive for *** All other review of systems negative except as mentioned in the HPI.  Physical Exam: Vital signs There were no vitals taken for this visit.  General:   Alert,  Well-developed, well-nourished, pleasant and  cooperative in NAD Lungs:  Clear throughout to auscultation.   Heart:  Regular rate and rhythm; no murmurs, clicks, rubs,  or gallops. Abdomen:  Soft, nontender and nondistended. Normal bowel sounds.   Neuro/Psych:  Alert and cooperative. Normal mood and affect. A and O x 3   @Joan Lopez  Joan Commander, MD, [facility] [phone] (pager) 03/31/2024 9:28 PM@

## 2024-03-31 NOTE — Telephone Encounter (Signed)
 Returned the patient's phone call and told her she would be able to proceed with her scheduled colonoscopy tomorrow just to push fluids today. She verbalized understanding.

## 2024-03-31 NOTE — Telephone Encounter (Signed)
 Inbound call from patient stating that she has a colonoscopy procedure tomorrow and want to confess that last night she ate a tuna salad with course ground pepper. Patient is wanting to know if that was ok, or if she is needing to reschedule her procedure. Please advise.

## 2024-04-01 ENCOUNTER — Ambulatory Visit: Admitting: Internal Medicine

## 2024-04-01 ENCOUNTER — Encounter: Payer: Self-pay | Admitting: Internal Medicine

## 2024-04-01 VITALS — BP 123/77 | HR 88 | Temp 97.2°F | Resp 16 | Ht 60.0 in | Wt 117.0 lb

## 2024-04-01 DIAGNOSIS — K5730 Diverticulosis of large intestine without perforation or abscess without bleeding: Secondary | ICD-10-CM

## 2024-04-01 DIAGNOSIS — Z1211 Encounter for screening for malignant neoplasm of colon: Secondary | ICD-10-CM | POA: Diagnosis not present

## 2024-04-01 DIAGNOSIS — K629 Disease of anus and rectum, unspecified: Secondary | ICD-10-CM

## 2024-04-01 MED ORDER — SODIUM CHLORIDE 0.9 % IV SOLN: 500.0000 mL | Freq: Once | INTRAVENOUS | Status: DC

## 2024-04-01 NOTE — Progress Notes (Signed)
 [place], gd SR, tolerated procedure well, VSS, report to RN

## 2024-04-01 NOTE — Op Note (Signed)
 [facility] Patient Name: Joan Lopez Procedure Date: 04/01/2024 9:11 AM MRN: [id number] Endoscopist: Lupita FORBES Commander , MD, [id number] Age: 73 Referring MD:  Date of Birth: 1950-08-16 Gender: Female Account #: [id number] Procedure:                Colonoscopy Indications:              Screening for colorectal malignant neoplasm, Last                            colonoscopy: 2015 Medicines:                Monitored Anesthesia Care Procedure:                Pre-Anesthesia Assessment:                           - Prior to the procedure, a History and Physical                            was performed, and patient medications and                            allergies were reviewed. The patient's tolerance of                            previous anesthesia was also reviewed. The risks                            and benefits of the procedure and the sedation                            options and risks were discussed with the patient.                            All questions were answered, and informed consent                            was obtained. Prior Anticoagulants: The patient has                            taken no anticoagulant or antiplatelet agents. ASA                            Grade Assessment: II - A patient with mild systemic                            disease. After reviewing the risks and benefits,                            the patient was deemed in satisfactory condition to                            undergo the procedure.  After obtaining informed consent, the colonoscope                            was passed under direct vision. Throughout the                            procedure, the patient's blood pressure, pulse, and                            oxygen saturations were monitored continuously. The                            Olympus Scope SN: [id number] was introduced through                            the anus and advanced to the the  cecum, identified                            by appendiceal orifice and ileocecal valve. The                            colonoscopy was performed without difficulty. The                            patient tolerated the procedure well. The quality                            of the bowel preparation was good. The ileocecal                            valve, appendiceal orifice, and rectum were                            photographed. The bowel preparation used was SUPREP                            via split dose instruction. Scope In: 9:30:46 AM Scope Out: 9:46:47 AM Scope Withdrawal Time: 0 hours 9 minutes 49 seconds  Total Procedure Duration: 0 hours 16 minutes 1 second  Findings:                 The perianal and digital rectal examinations were                            normal.                           Multiple large-mouthed, medium-mouthed and                            small-mouthed diverticula were found in the sigmoid                            colon and descending colon.  The exam was otherwise without abnormality on                            direct and retroflexion views. Complications:            No immediate complications. Estimated Blood Loss:     Estimated blood loss: none. Impression:               - Diverticulosis in the sigmoid colon, in the                            descending colon, in the transverse colon and in                            the ascending colon.                           - The examination was otherwise normal on direct                            and retroflexion views.                           - No specimens collected. Recommendation:           - Patient has a contact number available for                            emergencies. The signs and symptoms of potential                            delayed complications were discussed with the                            patient. Return to normal activities tomorrow.                             Written discharge instructions were provided to the                            patient.                           - Resume previous diet.                           - Continue present medications.                           - No repeat colonoscopy due to current age (75                            years or older) and the absence of colonic polyps. Lupita FORBES Commander, MD 04/01/2024 9:52:20 AM This report has been signed electronically.

## 2024-04-01 NOTE — Patient Instructions (Addendum)
 No polyps or cancer were seen.  You do have diverticulosis - thickened muscle rings and pouches in the colon wall. Please read the handout about this condition.  You do not need further routine repeat colonoscopy or stool testing to check for colon polyps or cancer at your age.  I appreciate the opportunity to care for you. Lupita CHARLENA Commander, MD, FACG   YOU HAD AN ENDOSCOPIC PROCEDURE TODAY AT THE [facility]:   Refer to the procedure report that was given to you for any specific questions about what was found during the examination.  If the procedure report does not answer your questions, please call your gastroenterologist to clarify.  If you requested that your care partner not be given the details of your procedure findings, then the procedure report has been included in a sealed envelope for you to review at your convenience later.  YOU SHOULD EXPECT: Some feelings of bloating in the abdomen. Passage of more gas than usual.  Walking can help get rid of the air that was put into your GI tract during the procedure and reduce the bloating. If you had a lower endoscopy (such as a colonoscopy or flexible sigmoidoscopy) you may notice spotting of blood in your stool or on the toilet paper. If you underwent a bowel prep for your procedure, you may not have a normal bowel movement for a few days.  Please Note:  You might notice some irritation and congestion in your nose or some drainage.  This is from the oxygen used during your procedure.  There is no need for concern and it should clear up in a day or so.  SYMPTOMS TO REPORT IMMEDIATELY:  Following lower endoscopy (colonoscopy or flexible sigmoidoscopy):  Excessive amounts of blood in the stool  Significant tenderness or worsening of abdominal pains  Swelling of the abdomen that is new, acute  Fever of 100F or higher  Resume previous diet Continue present medications   For urgent or emergent issues, a gastroenterologist can be  reached at any hour by calling [phone]. Do not use MyChart messaging for urgent concerns.    DIET:  We do recommend a small meal at first, but then you may proceed to your regular diet.  Drink plenty of fluids but you should avoid alcoholic beverages for 24 hours.  ACTIVITY:  You should plan to take it easy for the rest of today and you should NOT DRIVE or use heavy machinery until tomorrow (because of the sedation medicines used during the test).    FOLLOW UP: Our staff will call the number listed on your records the next business day following your procedure.  We will call around 7:15- 8:00 am to check on you and address any questions or concerns that you may have regarding the information given to you following your procedure. If we do not reach you, we will leave a message.     If any biopsies were taken you will be contacted by phone or by letter within the next 1-3 weeks.  Please call us  at [phone] if you have not heard about the biopsies in 3 weeks.    SIGNATURES/CONFIDENTIALITY: You and/or your care partner have signed paperwork which will be entered into your electronic medical record.  These signatures attest to the fact that that the information above on your After Visit Summary has been reviewed and is understood.  Full responsibility of the confidentiality of this discharge information lies with you and/or your care-partner.

## 2024-04-02 ENCOUNTER — Telehealth: Payer: Self-pay | Admitting: Lactation Services

## 2024-04-02 NOTE — Telephone Encounter (Signed)
°  Follow up Call-     04/01/2024    8:11 AM  Call back number  Post procedure Call Back phone  # [phone]  Permission to leave phone message Yes     Patient questions:  Do you have a fever, pain , or abdominal swelling? No. Pain Score  0 *  Have you tolerated food without any problems? Yes.    Have you been able to return to your normal activities? Yes.    Do you have any questions about your discharge instructions: Diet   No. Medications  No. Follow up visit  No.  Do you have questions or concerns about your Care? No.  Actions: * If pain score is 4 or above: No action needed, pain <4.

## 2024-05-17 ENCOUNTER — Other Ambulatory Visit: Payer: Self-pay

## 2024-05-17 ENCOUNTER — Emergency Department (HOSPITAL_BASED_OUTPATIENT_CLINIC_OR_DEPARTMENT_OTHER)
Admission: EM | Admit: 2024-05-17 | Discharge: 2024-05-17 | Disposition: A | Discharge status: Home / Self Care | Attending: Emergency Medicine | Admitting: Emergency Medicine

## 2024-05-17 ENCOUNTER — Emergency Department (HOSPITAL_BASED_OUTPATIENT_CLINIC_OR_DEPARTMENT_OTHER): Admitting: Radiology

## 2024-05-17 ENCOUNTER — Emergency Department (HOSPITAL_BASED_OUTPATIENT_CLINIC_OR_DEPARTMENT_OTHER)

## 2024-05-17 DIAGNOSIS — S52102A Unspecified fracture of upper end of left radius, initial encounter for closed fracture: Secondary | ICD-10-CM | POA: Insufficient documentation

## 2024-05-17 DIAGNOSIS — R519 Headache, unspecified: Secondary | ICD-10-CM | POA: Insufficient documentation

## 2024-05-17 DIAGNOSIS — W010XXA Fall on same level from slipping, tripping and stumbling without subsequent striking against object, initial encounter: Secondary | ICD-10-CM | POA: Diagnosis not present

## 2024-05-17 DIAGNOSIS — Y92009 Unspecified place in unspecified non-institutional (private) residence as the place of occurrence of the external cause: Secondary | ICD-10-CM | POA: Diagnosis not present

## 2024-05-17 DIAGNOSIS — Y9301 Activity, walking, marching and hiking: Secondary | ICD-10-CM | POA: Insufficient documentation

## 2024-05-17 DIAGNOSIS — M25532 Pain in left wrist: Secondary | ICD-10-CM | POA: Diagnosis present

## 2024-05-17 MED ORDER — OXYCODONE-ACETAMINOPHEN 5-325 MG PO TABS
1.0000 | ORAL_TABLET | Freq: Once | ORAL | Status: AC
  Administered 2024-05-17: 1 via ORAL
  Filled 2024-05-17: qty 1

## 2024-05-17 MED ORDER — OXYCODONE-ACETAMINOPHEN 5-325 MG PO TABS: 1.0000 | ORAL_TABLET | Freq: Four times a day (QID) | ORAL | 0 refills | Status: AC | PRN

## 2024-05-17 NOTE — ED Provider Notes (Signed)
 " [place] [facility] AT [facility] Provider Note   CSN: [id number] Arrival date & time: 05/17/24  1546     Patient presents with: Joan Lopez is a 74 y.o. female.    Fall  74 year old female presenting with a wrist injury after a fall.  Patient reports that she was walking around in her house and slipped then went to catch herself and hurt her wrist.  Patient does not know if she hit her head.  Patient is not on any blood thinners.  Patient did not lose consciousness.  Patient is not having pain anywhere else.     Prior to Admission medications  Medication Sig Start Date End Date Taking? Authorizing Provider  calcium  carbonate (OSCAL) 1500 (600 Ca) MG TABS tablet Take 1 tablet (1,500 mg total) by mouth 2 (two) times daily with a meal. 05/11/19   Magrinat, Sandria BROCKS, MD  folic acid  (FOLVITE ) 1 MG tablet Take 1 tablet (1 mg total) by mouth daily. 04/05/21   Magrinat, Sandria BROCKS, MD  loteprednol  (LOTEMAX ) 0.5 % ophthalmic suspension Place 1 drop into both eyes as directed. Reported on 06/07/2015 08/21/12   Domenica Harlene LABOR, MD  vitamin B-12 (CYANOCOBALAMIN) 1000 MCG tablet Take 1 tablet (1,000 mcg total) by mouth daily. 02/08/21   Curtis Debby PARAS, MD    Allergies: Augmentin [amoxicillin-pot clavulanate], Amoxicillin, and Clavulanic acid    Review of Systems  All other systems reviewed and are negative.   Updated Vital Signs BP (!) 168/112 (BP Location: Right Arm)   Pulse 94   Temp 97.6 F (36.4 C) (Temporal)   Resp 16   SpO2 98%   Physical Exam Vitals and nursing note reviewed.  HENT:     Mouth/Throat:     Pharynx: Oropharynx is clear.  Cardiovascular:     Rate and Rhythm: Normal rate.     Pulses: Normal pulses.  Pulmonary:     Effort: Pulmonary effort is normal.  Musculoskeletal:     Right wrist: Normal.     Left wrist: Swelling, deformity, tenderness and bony tenderness present. No effusion, lacerations, snuff box tenderness or  crepitus. Decreased range of motion. Normal pulse.     Comments: Radial pulse was 2+.  Patient was not able to move her wrist without pain.  Patient had good sensation in her fingers and was able to make a fist.  Skin:    General: Skin is warm and dry.  Neurological:     General: No focal deficit present.     Mental Status: She is alert.     (all labs ordered are listed, but only abnormal results are displayed) Labs Reviewed - No data to display  EKG: None  Radiology: CT Cervical Spine Wo Contrast Result Date: 05/17/2024 CLINICAL DATA:  [facility] EXAM: CT CERVICAL SPINE WITHOUT CONTRAST TECHNIQUE: Multidetector CT imaging of the cervical spine was performed without intravenous contrast. Multiplanar CT image reconstructions were also generated. RADIATION DOSE REDUCTION: This exam was performed according to the departmental dose-optimization program which includes automated exposure control, adjustment of the mA and/or kV according to patient size and/or use of iterative reconstruction technique. COMPARISON:  10/22/2022 FINDINGS: Alignment: Alignment is grossly anatomic. Skull base and vertebrae: No acute fracture. No primary bone lesion or focal pathologic process. Soft tissues and spinal canal: No prevertebral fluid or swelling. No visible canal hematoma. Disc levels: Moderate cervical spondylosis at C4-5, C5-6, and C6-7. There is left greater than right neural foraminal encroachment at these  levels. Mild diffuse facet hypertrophy, with partial bony fusion at the left C5-6 facet. Upper chest: Airway is patent. Lung apices are clear. 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 Other: Reconstructed images demonstrate no additional findings. IMPRESSION: 1. No acute cervical spine fracture. 2. Multilevel cervical degenerative changes as above. Electronically Signed   By: Ozell Daring M.D.   On: 05/17/2024 18:11   CT Head Wo Contrast Result Date:  05/17/2024 CLINICAL DATA:  [facility] EXAM: CT HEAD WITHOUT CONTRAST TECHNIQUE: Contiguous axial images were obtained from the base of the skull through the vertex without intravenous contrast. RADIATION DOSE REDUCTION: This exam was performed according to the departmental dose-optimization program which includes automated exposure control, adjustment of the mA and/or kV according to patient size and/or use of iterative reconstruction technique. COMPARISON:  02/03/2017 FINDINGS: Brain: No acute infarct or hemorrhage. Lateral ventricles and midline structures are unremarkable. No acute extra-axial fluid collections. No mass effect. Vascular: No hyperdense vessel or unexpected calcification. Skull: Normal. Negative for fracture or focal lesion. Sinuses/Orbits: Near complete opacification of the right sphenoid sinus. Remaining paranasal sinuses are clear. Other: None. IMPRESSION: 1. No acute intracranial process. 2. Right sphenoid sinus disease. Electronically Signed   By: Ozell Daring M.D.   On: 05/17/2024 18:09   DG Wrist Complete Left Result Date: 05/17/2024 CLINICAL DATA:  [facility], pain, injury EXAM: LEFT WRIST - COMPLETE 3+ VIEW COMPARISON:  01/24/2013 FINDINGS: Frontal, oblique, and lateral views of the left wrist are obtained. There is a minimally displaced extra-articular fracture through the distal left radial metadiaphyseal junction, with slight impaction and dorsal angulation at the fracture site. Radiocarpal joint remains intact. No other acute bony abnormalities. Mild diffuse osteoarthritis, greatest within the radial aspect of the distal carpal row. Diffuse soft tissue swelling. IMPRESSION: 1. Minimally displaced extra-articular fracture through the distal left radial metadiaphyseal junction, with slight dorsal impaction and angulation. 2. Diffuse soft tissue swelling. 3. Osteoarthritis. Electronically Signed   By: Ozell Daring M.D.   On: 05/17/2024 16:41     Procedures   Medications Ordered in the ED   oxyCODONE -acetaminophen  (PERCOCET/ROXICET) 5-325 MG per tablet 1 tablet (has no administration in time range)                                    Medical Decision Making Amount and/or Complexity of Data Reviewed Radiology: ordered.  Risk Prescription drug management.   Impression: 74 year old female presenting with wrist pain after fall.  Differential diagnosis include fracture, dislocation, strain  Additional History: Patient was able to provide history.  I also reviewed other outpatient notes  Labs: None  Imaging: CT head and cervical spine without contrast showed no signs of acute findings.  X-ray of the wrist showed  Minimally displaced extra-articular fracture through the distal  left radial metadiaphyseal junction, with slight dorsal impaction  and angulation  I reviewed the images and agree with radiologist interpretation.  ED Course/Meds: 73 year old female presenting to the ER after a fall.  Patient was well-appearing in no acute distress.  Patient reports that she was walking around her house whenever she fell and caught herself.  She does not know if she hit her head or not.  She is not on any blood thinners.  Due to the fact that she does not know if she hit her head a CT of the head and cervical spine was performed  and showed no acute abnormalities.  X-ray of the wrist showed a minimally displaced fracture of the left radial medial diaphyseal junction with slight dorsal impaction and angulation.  Patient was put in a splint.  Patient was given some pain medicine which she reported helped.  Because of this pain medication was sent into her pharmacy.  Patient was informed that she should follow-up with the orthopedic surgeon in the next few days.  A orthopedist was provided for her.  Patient was provided strict return precaution.  Patient agreed to the plan stated above.  All questions were answered.  Patient remained stable in the ER and at discharge.      Final diagnoses:   None    ED Discharge Orders     None          Rosaline Almarie MATSU, [place] 05/17/24 2304  "

## 2024-05-17 NOTE — ED Triage Notes (Signed)
 Pt reports mechanical fall in utility room at home, falling backwards, catching herself with her left wrist   Visible swelling and deformity noted to left wrist.  Pt unsure if she hit her head in the fall, denies LOC, denies blood thinners. AAOx4.

## 2024-05-17 NOTE — ED Notes (Signed)
 Pt reports pain to left wrist only when moving it. RN offered pt an ice pack but she declined at this time. Ambulatory to room w/o assistance and no gait instability noted. A/O x 4. Family at bedside.

## 2024-05-17 NOTE — Discharge Instructions (Addendum)
 You were seen in the ER today for a fracture.  You have been placed in a splint.  I have sent you in some pain medication you may take as needed.  This is an opioid so I recommend only using it whenever you are having very severe pain.  I have also provided you a follow-up with a orthopedist.  I recommend that you call them tomorrow to set up an appointment.  If you start to have any worsening pain, numbness or tingling or any other new symptoms please return to the ER.

## 2024-05-20 ENCOUNTER — Telehealth (HOSPITAL_BASED_OUTPATIENT_CLINIC_OR_DEPARTMENT_OTHER): Payer: Self-pay | Admitting: Orthopaedic Surgery

## 2024-05-20 NOTE — Telephone Encounter (Signed)
 Patient states that her thumb greenish and she just wants to make sure nothing is wrong she comes in tomorrow. Best contact [phone]  or mychart

## 2024-05-21 ENCOUNTER — Ambulatory Visit (INDEPENDENT_AMBULATORY_CARE_PROVIDER_SITE_OTHER): Admitting: Orthopaedic Surgery

## 2024-05-21 DIAGNOSIS — S52552A Other extraarticular fracture of lower end of left radius, initial encounter for closed fracture: Secondary | ICD-10-CM

## 2024-05-21 NOTE — Progress Notes (Signed)
 "   Chief Complaint: Left distal radius fracture     History of Present Illness:    Joan Lopez is a 74 y.o. female with left distal radius fracture after she slipped in her house on February 1.  She is right-hand dominant.  She is a sole caregiver for her husband.  She does have a history of contralateral distal radius fracture which was fixed with surgery.  She did well with this.    PMH/PSH/Family History/Social History/Meds/Allergies:    Past Medical History:  Diagnosis Date   Abnormal LFTs 10/29/2012   Acquired bilateral renal cysts    Arthritis    Bilateral bunions    Cancer (HCC) 08/2018   Cataract    Constipation 10/16/2011   DDD (degenerative disc disease)    spine w narrowing   Dehydration, mild 12/26/2011   Diverticulitis    Exercise counseling 11/14/2011   Family history of brain cancer    Family history of breast cancer    Family history of cancer    Family history of colon cancer    Family history of kidney cancer    Family history of oral cancer    Family history of stomach cancer    Fuch's endothelial dystrophy    Guttate psoriasis    Hematuria 08/24/2011   Hemorrhoids    History of chicken pox    Hyperlipidemia    Kidney stones    Lipoma of arm 07/13/2012   Lipoma of arm 10/07/2012   Left    Mass of arm 07/13/2012   Has had them in past     Personal history of radiation therapy    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   Post-menopausal bleeding 10/08/2011   Reflux 04/17/2012   Renal insufficiency 01/11/2013   Thrombocytopenia 09/27/2011   Past Surgical History:  Procedure Laterality Date   APPENDECTOMY  1986   BREAST LUMPECTOMY Right 10/2018   BREAST LUMPECTOMY WITH RADIOACTIVE SEED AND SENTINEL LYMPH NODE BIOPSY Right 10/24/2018   Procedure: RIGHT BREAST LUMPECTOMY WITH RADIOACTIVE SEED AND RIGHT AXILLARY SENTINEL LYMPH NODE BIOPSY, RIGHT BLUE DYE INJECTION;  Surgeon: Gail Favorite, MD;  Location: [facility];  Service:  General;  Laterality: Right;  PEC BLOCK   CESAREAN SECTION     CHOLECYSTECTOMY     COLONOSCOPY  2005   (records here)[place] - diverticulosis and hemorrhoids   CORNEAL TRANSPLANT  2013   CYSTOSCOPY WITH RETROGRADE PYELOGRAM, URETEROSCOPY AND STENT PLACEMENT Right 10/22/2013   Procedure: CYSTOSCOPY WITH RETROGRADE PYELOGRAM, URETEROSCOPY ;  Surgeon: Gretel Ferrara, MD;  Location: [facility];  Service: Urology;  Laterality: Right;   EYE SURGERY  2014   FRACTURE SURGERY  2012   LIPOMA EXCISION  2014   x 3, both arms   TONSILLECTOMY     possible adenoids   TOTAL ABDOMINAL HYSTERECTOMY  1991   for fibroids   TUBAL LIGATION     WRIST FRACTURE SURGERY     right, titanium plate and pins   Social History   Socioeconomic History   Marital status: Married    Spouse name: Not on file   Number of children: Not on file   Years of education: Not on file   Highest education level: Professional school degree (e.g., MD, DDS, DVM, JD)  Occupational History   Not on file  Tobacco Use   Smoking status: Never   Smokeless tobacco: Never  Vaping Use   Vaping status: Never Used  Substance and Sexual Activity   Alcohol  use: No   Drug use: No   Sexual activity: Yes  Other Topics Concern   Not on file  Social History Narrative   She live with her husband. She enjoys reading, puzzles and running.    Social Drivers of Health   Tobacco Use: Low Risk (04/01/2024)   Patient History    Smoking Tobacco Use: Never    Smokeless Tobacco Use: Never    Passive Exposure: Not on file  Financial Resource Strain: Low Risk (09/30/2023)   Overall Financial Resource Strain (CARDIA)    Difficulty of Paying Living Expenses: Not hard at all  Food Insecurity: No Food Insecurity (09/30/2023)   Epic    Worried About Programme Researcher, Broadcasting/film/video in the Last Year: Never true    Ran Out of Food in the Last Year: Never true  Transportation Needs: No Transportation Needs (09/30/2023)   Epic    Lack of Transportation (Medical):  No    Lack of Transportation (Non-Medical): No  Physical Activity: Sufficiently Active (09/30/2023)   Exercise Vital Sign    Days of Exercise per Week: 7 days    Minutes of Exercise per Session: 60 min  Stress: No Stress Concern Present (09/30/2023)   [employer] of Occupational Health - Occupational Stress Questionnaire    Feeling of Stress: Only a little  Social Connections: Moderately Isolated (09/30/2023)   Social Connection and Isolation Panel    Frequency of Communication with Friends and Family: More than three times a week    Frequency of Social Gatherings with Friends and Family: Three times a week    Attends Religious Services: Never    Active Member of Clubs or Organizations: No    Attends Banker Meetings: Not on file    Marital Status: Married  Depression (PHQ2-9): Low Risk (09/12/2023)   Depression (PHQ2-9)    PHQ-2 Score: 1  Alcohol Screen: Low Risk (09/12/2023)   Alcohol Screen    Last Alcohol Screening Score (AUDIT): 0  Housing: Unknown (11/14/2023)   Received from [facility]   Epic    Unable to Pay for Housing in the Last Year: Not on file    Number of Times Moved in the Last Year: Not on file    At any time in the past 12 months, were you homeless or living in a shelter (including now)?: No  Utilities: Not At Risk (09/12/2023)   [facility] Utilities    Threatened with loss of utilities: No  Health Literacy: Adequate Health Literacy (09/12/2023)   B1300 Health Literacy    Frequency of need for help with medical instructions: Never   Family History  Problem Relation Age of Onset   Stroke Mother    Heart disease Mother    Hypertension Mother    Arthritis Mother    Kidney disease Father    Breast cancer Sister 55   Cancer Sister 43       ductal carcinoma breast- had it in 1999 and just found out its in the opposite breast   Brain cancer Maternal Grandmother 55       brain tumor   Breast cancer Paternal Grandmother 43       post  menopausal    Pneumonia Maternal Grandfather    Colon cancer Maternal Aunt 6       40s or 30s   Cancer Paternal Uncle 64       neck tumor   Stomach cancer Maternal Aunt 61  74s   Kidney cancer Cousin 30       30s, maternal first cousin   Cancer Paternal Aunt        blood cancer, unknown but older age at diagnosis   Cancer Maternal Aunt 40       unknown, but not breast, 64s   Cancer Cousin        oral cancer diagnosed older, maternal first cousin   Esophageal cancer Neg Hx    Pancreatic cancer Neg Hx    Rectal cancer Neg Hx    Allergies[1] Current Outpatient Medications  Medication Sig Dispense Refill   calcium  carbonate (OSCAL) 1500 (600 Ca) MG TABS tablet Take 1 tablet (1,500 mg total) by mouth 2 (two) times daily with a meal.     folic acid  (FOLVITE ) 1 MG tablet Take 1 tablet (1 mg total) by mouth daily. 90 tablet 3   loteprednol  (LOTEMAX ) 0.5 % ophthalmic suspension Place 1 drop into both eyes as directed. Reported on 06/07/2015     oxyCODONE -acetaminophen  (PERCOCET/ROXICET) 5-325 MG tablet Take 1 tablet by mouth every 6 (six) hours as needed for severe pain (pain score 7-10). 10 tablet 0   vitamin B-12 (CYANOCOBALAMIN) 1000 MCG tablet Take 1 tablet (1,000 mcg total) by mouth daily. 90 tablet 3   No current facility-administered medications for this visit.   No results found.  Review of Systems:   A ROS was performed including pertinent positives and negatives as documented in the HPI.  Physical Exam :   Constitutional: NAD and appears stated age Neurological: Alert and oriented Psych: Appropriate affect and cooperative There were no vitals taken for this visit.   Comprehensive Musculoskeletal Exam:    Left wrist with splint intact.  Fingers are well-perfused fires interosseous muscles with intact pinch   Imaging:   Xray (3 views left wrist): Minimally displaced distal radius fracture with mild shortening   I personally reviewed and interpreted the  radiographs.   Assessment and Plan:   74 y.o. female with minimally displaced distal radius fracture with mild shortening.  At this time I did describe her situation is somewhat complicated as she is the sole caregiver of her husband.  She is very concerned about her ability to manage him and help him with transitions.  Given this I do ultimately believe she would be a candidate for discussion of fixation of her distal radius fracture.  Will plan to send her for referral with Dr. Erwin for discussion of this  -Follow-up with Dr. Erwin for possible surgical discussion of left distal radius   I personally saw and evaluated the patient, and participated in the management and treatment plan.  Elspeth Parker, MD Attending Physician, Orthopedic Surgery  This document was dictated using Dragon voice recognition software. A reasonable attempt at proof reading has been made to minimize errors.    [1]  Allergies Allergen Reactions   Augmentin [Amoxicillin-Pot Clavulanate] Other (See Comments)    Severe yeast infection-would prefer not to take.   Amoxicillin Other (See Comments)    Causes yeast infections   Clavulanic Acid Other (See Comments)    Causes yeast infections   "

## 2024-05-26 ENCOUNTER — Ambulatory Visit: Admitting: Orthopedic Surgery

## 2024-09-01 ENCOUNTER — Encounter: Admitting: Urgent Care

## 2024-09-15 ENCOUNTER — Ambulatory Visit
